# Patient Record
Sex: Female | Born: 1950
Health system: Southern US, Community
[De-identification: ages and names within clinical notes are randomized; demographics above are authoritative.]

## PROBLEM LIST (undated history)

## (undated) DIAGNOSIS — K5792 Diverticulitis of intestine, part unspecified, without perforation or abscess without bleeding: Secondary | ICD-10-CM

## (undated) DIAGNOSIS — R5383 Other fatigue: Secondary | ICD-10-CM

## (undated) DIAGNOSIS — R011 Cardiac murmur, unspecified: Secondary | ICD-10-CM

## (undated) DIAGNOSIS — I5022 Chronic systolic (congestive) heart failure: Secondary | ICD-10-CM

## (undated) DIAGNOSIS — M8580 Other specified disorders of bone density and structure, unspecified site: Secondary | ICD-10-CM

## (undated) DIAGNOSIS — G4733 Obstructive sleep apnea (adult) (pediatric): Secondary | ICD-10-CM

## (undated) DIAGNOSIS — C50919 Malignant neoplasm of unspecified site of unspecified female breast: Secondary | ICD-10-CM

## (undated) DIAGNOSIS — R0683 Snoring: Secondary | ICD-10-CM

## (undated) DIAGNOSIS — E785 Hyperlipidemia, unspecified: Secondary | ICD-10-CM

## (undated) DIAGNOSIS — G459 Transient cerebral ischemic attack, unspecified: Secondary | ICD-10-CM

## (undated) DIAGNOSIS — F32A Depression, unspecified: Secondary | ICD-10-CM

## (undated) DIAGNOSIS — H40003 Preglaucoma, unspecified, bilateral: Secondary | ICD-10-CM

## (undated) DIAGNOSIS — I428 Other cardiomyopathies: Secondary | ICD-10-CM

## (undated) DIAGNOSIS — F329 Major depressive disorder, single episode, unspecified: Secondary | ICD-10-CM

## (undated) DIAGNOSIS — M1990 Unspecified osteoarthritis, unspecified site: Secondary | ICD-10-CM

## (undated) DIAGNOSIS — T7840XA Allergy, unspecified, initial encounter: Secondary | ICD-10-CM

## (undated) DIAGNOSIS — E28319 Asymptomatic premature menopause: Secondary | ICD-10-CM

## (undated) DIAGNOSIS — N200 Calculus of kidney: Secondary | ICD-10-CM

## (undated) HISTORY — DX: Other specified disorders of bone density and structure, unspecified site: M85.80

## (undated) HISTORY — DX: Diverticulitis of intestine, part unspecified, without perforation or abscess without bleeding: K57.92

## (undated) HISTORY — PX: OOPHORECTOMY: SHX86

## (undated) HISTORY — DX: Asymptomatic premature menopause: E28.319

## (undated) HISTORY — DX: Unspecified osteoarthritis, unspecified site: M19.90

## (undated) HISTORY — DX: Hyperlipidemia, unspecified: E78.5

## (undated) HISTORY — DX: Allergy, unspecified, initial encounter: T78.40XA

## (undated) HISTORY — DX: Major depressive disorder, single episode, unspecified: F32.9

## (undated) HISTORY — DX: Transient cerebral ischemic attack, unspecified: G45.9

## (undated) HISTORY — DX: Malignant neoplasm of unspecified site of unspecified female breast: C50.919

## (undated) HISTORY — DX: Cardiac murmur, unspecified: R01.1

## (undated) HISTORY — PX: LITHOTRIPSY: SUR834

## (undated) HISTORY — DX: Preglaucoma, unspecified, bilateral: H40.003

## (undated) HISTORY — DX: Depression, unspecified: F32.A

## (undated) HISTORY — PX: MASTECTOMY: SHX3

## (undated) HISTORY — DX: Calculus of kidney: N20.0

## (undated) HISTORY — DX: Other fatigue: R53.83

## (undated) HISTORY — DX: Obstructive sleep apnea (adult) (pediatric): G47.33

## (undated) HISTORY — DX: Snoring: R06.83

## (undated) HISTORY — PX: CARDIAC DEFIBRILLATOR PLACEMENT: SHX171

---

## 1999-11-04 ENCOUNTER — Encounter: Payer: Self-pay | Admitting: Hematology and Oncology

## 1999-11-04 ENCOUNTER — Encounter: Admission: RE | Admit: 1999-11-04 | Discharge: 1999-11-04 | Payer: Self-pay | Admitting: Hematology and Oncology

## 2000-08-11 ENCOUNTER — Encounter: Payer: Self-pay | Admitting: Oncology

## 2000-08-11 ENCOUNTER — Ambulatory Visit (HOSPITAL_COMMUNITY): Admission: RE | Admit: 2000-08-11 | Discharge: 2000-08-11 | Payer: Self-pay | Admitting: Oncology

## 2000-11-03 ENCOUNTER — Encounter: Payer: Self-pay | Admitting: Oncology

## 2000-11-03 ENCOUNTER — Encounter: Admission: RE | Admit: 2000-11-03 | Discharge: 2000-11-03 | Payer: Self-pay | Admitting: Oncology

## 2002-02-23 ENCOUNTER — Encounter: Payer: Self-pay | Admitting: Oncology

## 2002-02-23 ENCOUNTER — Ambulatory Visit (HOSPITAL_COMMUNITY): Admission: RE | Admit: 2002-02-23 | Discharge: 2002-02-23 | Payer: Self-pay | Admitting: Oncology

## 2002-03-15 ENCOUNTER — Encounter: Payer: Self-pay | Admitting: Family Medicine

## 2002-03-15 ENCOUNTER — Encounter: Admission: RE | Admit: 2002-03-15 | Discharge: 2002-03-15 | Payer: Self-pay | Admitting: Family Medicine

## 2002-04-29 ENCOUNTER — Encounter: Payer: Self-pay | Admitting: Internal Medicine

## 2002-04-29 ENCOUNTER — Ambulatory Visit (HOSPITAL_COMMUNITY): Admission: RE | Admit: 2002-04-29 | Discharge: 2002-04-29 | Payer: Self-pay | Admitting: Gastroenterology

## 2002-04-29 LAB — HM COLONOSCOPY

## 2003-03-27 ENCOUNTER — Encounter: Payer: Self-pay | Admitting: Oncology

## 2003-03-27 ENCOUNTER — Ambulatory Visit (HOSPITAL_COMMUNITY): Admission: RE | Admit: 2003-03-27 | Discharge: 2003-03-27 | Payer: Self-pay | Admitting: Oncology

## 2003-08-08 ENCOUNTER — Encounter: Admission: RE | Admit: 2003-08-08 | Discharge: 2003-08-08 | Payer: Self-pay | Admitting: Family Medicine

## 2003-08-08 ENCOUNTER — Encounter: Payer: Self-pay | Admitting: Family Medicine

## 2003-09-01 ENCOUNTER — Encounter: Admission: RE | Admit: 2003-09-01 | Discharge: 2003-09-01 | Payer: Self-pay | Admitting: Orthopedic Surgery

## 2003-09-01 ENCOUNTER — Encounter: Payer: Self-pay | Admitting: Orthopedic Surgery

## 2003-09-05 ENCOUNTER — Encounter: Admission: RE | Admit: 2003-09-05 | Discharge: 2003-10-24 | Payer: Self-pay | Admitting: Orthopedic Surgery

## 2003-10-26 ENCOUNTER — Encounter (INDEPENDENT_AMBULATORY_CARE_PROVIDER_SITE_OTHER): Payer: Self-pay | Admitting: Cardiology

## 2003-10-26 ENCOUNTER — Ambulatory Visit (HOSPITAL_COMMUNITY): Admission: RE | Admit: 2003-10-26 | Discharge: 2003-10-26 | Payer: Self-pay | Admitting: Cardiology

## 2004-03-18 ENCOUNTER — Other Ambulatory Visit: Admission: RE | Admit: 2004-03-18 | Discharge: 2004-03-18 | Payer: Self-pay | Admitting: Family Medicine

## 2004-05-24 ENCOUNTER — Inpatient Hospital Stay (HOSPITAL_COMMUNITY): Admission: RE | Admit: 2004-05-24 | Discharge: 2004-05-25 | Payer: Self-pay | Admitting: Internal Medicine

## 2004-10-25 ENCOUNTER — Ambulatory Visit: Payer: Self-pay | Admitting: Internal Medicine

## 2005-01-06 ENCOUNTER — Ambulatory Visit: Payer: Self-pay | Admitting: Oncology

## 2005-02-06 ENCOUNTER — Ambulatory Visit: Payer: Self-pay | Admitting: Family Medicine

## 2005-02-24 ENCOUNTER — Ambulatory Visit: Payer: Self-pay | Admitting: Internal Medicine

## 2005-03-21 ENCOUNTER — Ambulatory Visit: Payer: Self-pay | Admitting: Family Medicine

## 2005-03-31 ENCOUNTER — Ambulatory Visit: Payer: Self-pay | Admitting: Oncology

## 2005-04-30 ENCOUNTER — Ambulatory Visit: Payer: Self-pay | Admitting: Internal Medicine

## 2005-05-29 ENCOUNTER — Ambulatory Visit: Payer: Self-pay | Admitting: Internal Medicine

## 2005-05-29 ENCOUNTER — Ambulatory Visit: Payer: Self-pay | Admitting: Cardiology

## 2005-06-04 ENCOUNTER — Ambulatory Visit: Payer: Self-pay | Admitting: Internal Medicine

## 2005-06-20 ENCOUNTER — Ambulatory Visit: Payer: Self-pay | Admitting: Internal Medicine

## 2005-06-25 ENCOUNTER — Ambulatory Visit (HOSPITAL_COMMUNITY): Admission: RE | Admit: 2005-06-25 | Discharge: 2005-06-25 | Payer: Self-pay | Admitting: Internal Medicine

## 2005-06-25 ENCOUNTER — Ambulatory Visit: Payer: Self-pay | Admitting: Internal Medicine

## 2005-06-27 ENCOUNTER — Ambulatory Visit: Payer: Self-pay | Admitting: Internal Medicine

## 2005-08-20 ENCOUNTER — Encounter: Admission: RE | Admit: 2005-08-20 | Discharge: 2005-08-20 | Payer: Self-pay | Admitting: Internal Medicine

## 2005-08-20 ENCOUNTER — Ambulatory Visit: Payer: Self-pay | Admitting: Internal Medicine

## 2005-09-01 ENCOUNTER — Ambulatory Visit: Payer: Self-pay | Admitting: Gastroenterology

## 2005-09-15 ENCOUNTER — Ambulatory Visit: Payer: Self-pay | Admitting: Family Medicine

## 2005-09-15 ENCOUNTER — Encounter: Payer: Self-pay | Admitting: Internal Medicine

## 2005-10-06 ENCOUNTER — Ambulatory Visit: Payer: Self-pay | Admitting: Internal Medicine

## 2005-12-12 ENCOUNTER — Ambulatory Visit: Payer: Self-pay | Admitting: Internal Medicine

## 2005-12-16 ENCOUNTER — Ambulatory Visit: Payer: Self-pay | Admitting: *Deleted

## 2006-01-06 ENCOUNTER — Ambulatory Visit: Payer: Self-pay | Admitting: Oncology

## 2006-02-13 ENCOUNTER — Ambulatory Visit: Payer: Self-pay | Admitting: Internal Medicine

## 2006-03-03 ENCOUNTER — Ambulatory Visit: Payer: Self-pay | Admitting: Internal Medicine

## 2006-05-01 ENCOUNTER — Ambulatory Visit: Payer: Self-pay | Admitting: Oncology

## 2006-05-05 LAB — CBC WITH DIFFERENTIAL/PLATELET
BASO%: 0.5 % (ref 0.0–2.0)
Basophils Absolute: 0 10*3/uL (ref 0.0–0.1)
EOS%: 2.5 % (ref 0.0–7.0)
Eosinophils Absolute: 0.1 10*3/uL (ref 0.0–0.5)
HCT: 40.3 % (ref 34.8–46.6)
HGB: 13.8 g/dL (ref 11.6–15.9)
LYMPH%: 42.1 % (ref 14.0–48.0)
MCH: 31 pg (ref 26.0–34.0)
MCHC: 34.3 g/dL (ref 32.0–36.0)
MCV: 90.4 fL (ref 81.0–101.0)
MONO#: 0.6 10*3/uL (ref 0.1–0.9)
MONO%: 11.8 % (ref 0.0–13.0)
NEUT#: 2.2 10*3/uL (ref 1.5–6.5)
NEUT%: 43.1 % (ref 39.6–76.8)
Platelets: 271 10*3/uL (ref 145–400)
RBC: 4.46 10*6/uL (ref 3.70–5.32)
RDW: 13.1 % (ref 11.3–14.5)
WBC: 5.2 10*3/uL (ref 3.9–10.0)
lymph#: 2.2 10*3/uL (ref 0.9–3.3)

## 2006-05-05 LAB — MORPHOLOGY
PLT EST: ADEQUATE
RBC Comments: NORMAL

## 2006-05-11 ENCOUNTER — Ambulatory Visit: Payer: Self-pay | Admitting: Internal Medicine

## 2006-08-04 ENCOUNTER — Ambulatory Visit: Payer: Self-pay | Admitting: Internal Medicine

## 2006-08-17 ENCOUNTER — Other Ambulatory Visit: Admission: RE | Admit: 2006-08-17 | Discharge: 2006-08-17 | Payer: Self-pay | Admitting: Internal Medicine

## 2006-08-17 ENCOUNTER — Encounter: Payer: Self-pay | Admitting: Internal Medicine

## 2006-08-17 ENCOUNTER — Ambulatory Visit: Payer: Self-pay | Admitting: Internal Medicine

## 2006-08-24 ENCOUNTER — Ambulatory Visit: Admission: RE | Admit: 2006-08-24 | Discharge: 2006-08-24 | Payer: Self-pay | Admitting: Internal Medicine

## 2006-08-24 ENCOUNTER — Ambulatory Visit: Payer: Self-pay | Admitting: Internal Medicine

## 2006-09-28 ENCOUNTER — Ambulatory Visit: Payer: Self-pay | Admitting: Internal Medicine

## 2007-02-22 ENCOUNTER — Ambulatory Visit: Payer: Self-pay | Admitting: Internal Medicine

## 2007-03-02 ENCOUNTER — Ambulatory Visit: Payer: Self-pay | Admitting: Gastroenterology

## 2007-04-06 ENCOUNTER — Ambulatory Visit: Payer: Self-pay | Admitting: Internal Medicine

## 2007-04-06 DIAGNOSIS — Z8719 Personal history of other diseases of the digestive system: Secondary | ICD-10-CM

## 2007-04-06 DIAGNOSIS — R519 Headache, unspecified: Secondary | ICD-10-CM | POA: Insufficient documentation

## 2007-04-06 DIAGNOSIS — Z87442 Personal history of urinary calculi: Secondary | ICD-10-CM | POA: Insufficient documentation

## 2007-04-06 DIAGNOSIS — Z95810 Presence of automatic (implantable) cardiac defibrillator: Secondary | ICD-10-CM

## 2007-04-06 DIAGNOSIS — I5022 Chronic systolic (congestive) heart failure: Secondary | ICD-10-CM

## 2007-04-06 DIAGNOSIS — Z853 Personal history of malignant neoplasm of breast: Secondary | ICD-10-CM

## 2007-04-06 DIAGNOSIS — R51 Headache: Secondary | ICD-10-CM

## 2007-04-06 DIAGNOSIS — I429 Cardiomyopathy, unspecified: Secondary | ICD-10-CM | POA: Insufficient documentation

## 2007-04-06 DIAGNOSIS — F329 Major depressive disorder, single episode, unspecified: Secondary | ICD-10-CM

## 2007-05-17 ENCOUNTER — Ambulatory Visit: Payer: Self-pay | Admitting: Internal Medicine

## 2007-06-11 ENCOUNTER — Ambulatory Visit: Payer: Self-pay | Admitting: Oncology

## 2007-06-15 ENCOUNTER — Encounter: Payer: Self-pay | Admitting: Internal Medicine

## 2007-06-15 LAB — CBC WITH DIFFERENTIAL/PLATELET
BASO%: 0.7 % (ref 0.0–2.0)
LYMPH%: 45.1 % (ref 14.0–48.0)
MCHC: 35.3 g/dL (ref 32.0–36.0)
MONO#: 0.7 10*3/uL (ref 0.1–0.9)
Platelets: 251 10*3/uL (ref 145–400)
RBC: 4.19 10*6/uL (ref 3.70–5.32)
WBC: 5.3 10*3/uL (ref 3.9–10.0)

## 2007-06-15 LAB — COMPREHENSIVE METABOLIC PANEL
ALT: 21 U/L (ref 0–35)
Alkaline Phosphatase: 62 U/L (ref 39–117)
CO2: 26 mEq/L (ref 19–32)
Sodium: 142 mEq/L (ref 135–145)
Total Bilirubin: 0.3 mg/dL (ref 0.3–1.2)
Total Protein: 6.9 g/dL (ref 6.0–8.3)

## 2007-07-21 ENCOUNTER — Telehealth: Payer: Self-pay | Admitting: Internal Medicine

## 2007-07-22 ENCOUNTER — Ambulatory Visit: Payer: Self-pay | Admitting: Internal Medicine

## 2007-10-01 ENCOUNTER — Ambulatory Visit: Payer: Self-pay | Admitting: Internal Medicine

## 2007-10-01 DIAGNOSIS — M949 Disorder of cartilage, unspecified: Secondary | ICD-10-CM

## 2007-10-01 DIAGNOSIS — M899 Disorder of bone, unspecified: Secondary | ICD-10-CM | POA: Insufficient documentation

## 2007-11-01 ENCOUNTER — Ambulatory Visit: Payer: Self-pay | Admitting: Gastroenterology

## 2007-11-30 ENCOUNTER — Ambulatory Visit: Payer: Self-pay | Admitting: Internal Medicine

## 2007-12-23 ENCOUNTER — Telehealth (INDEPENDENT_AMBULATORY_CARE_PROVIDER_SITE_OTHER): Payer: Self-pay | Admitting: *Deleted

## 2007-12-24 ENCOUNTER — Ambulatory Visit: Payer: Self-pay | Admitting: Internal Medicine

## 2007-12-30 LAB — CONVERTED CEMR LAB
ALT: 19 units/L (ref 0–35)
AST: 22 units/L (ref 0–37)
BUN: 12 mg/dL (ref 6–23)
Basophils Absolute: 0 10*3/uL (ref 0.0–0.1)
Basophils Relative: 0 % (ref 0–1)
Calcium: 9.2 mg/dL (ref 8.4–10.5)
Cholesterol: 205 mg/dL — ABNORMAL HIGH (ref 0–200)
Eosinophils Absolute: 0.1 10*3/uL (ref 0.0–0.7)
Glucose, Bld: 91 mg/dL (ref 70–99)
HCT: 40.1 % (ref 36.0–46.0)
HDL: 51 mg/dL (ref >39)
Hemoglobin: 13.7 g/dL (ref 12.0–15.0)
LDL Cholesterol: 123 mg/dL — ABNORMAL HIGH (ref 0–99)
Lymphocytes Relative: 49 % — ABNORMAL HIGH (ref 12–46)
MCV: 90.9 fL (ref 78.0–100.0)
Monocytes Absolute: 0.7 10*3/uL (ref 0.1–1.0)
Neutro Abs: 1.9 10*3/uL (ref 1.7–7.7)
Potassium: 4.2 meq/L (ref 3.5–5.3)
RBC: 4.41 M/uL (ref 3.87–5.11)
Sodium: 139 meq/L (ref 135–145)
TSH: 0.872 microintl units/mL (ref 0.350–5.50)
Total CHOL/HDL Ratio: 4
WBC: 5.3 10*3/uL (ref 4.0–10.5)

## 2008-01-25 ENCOUNTER — Telehealth (INDEPENDENT_AMBULATORY_CARE_PROVIDER_SITE_OTHER): Payer: Self-pay | Admitting: *Deleted

## 2008-02-07 ENCOUNTER — Ambulatory Visit: Payer: Self-pay | Admitting: Internal Medicine

## 2008-02-07 DIAGNOSIS — G4700 Insomnia, unspecified: Secondary | ICD-10-CM

## 2008-03-06 ENCOUNTER — Ambulatory Visit: Payer: Self-pay | Admitting: Internal Medicine

## 2008-03-20 ENCOUNTER — Ambulatory Visit: Payer: Self-pay | Admitting: Internal Medicine

## 2008-03-20 LAB — CONVERTED CEMR LAB
BUN: 10 mg/dL (ref 6–23)
Calcium: 9.1 mg/dL (ref 8.4–10.5)
Chloride: 110 meq/L (ref 96–112)
Creatinine, Ser: 0.9 mg/dL (ref 0.4–1.2)
Eosinophils Absolute: 0.1 10*3/uL (ref 0.0–0.7)
GFR calc Af Amer: 83 mL/min
Glucose, Bld: 81 mg/dL (ref 70–99)
HCT: 39.1 % (ref 36.0–46.0)
MCV: 92.1 fL (ref 78.0–100.0)
Neutro Abs: 2.6 10*3/uL (ref 1.4–7.7)
Potassium: 4.3 meq/L (ref 3.5–5.1)
RDW: 12.1 % (ref 11.5–14.6)
Sodium: 142 meq/L (ref 135–145)

## 2008-03-24 ENCOUNTER — Ambulatory Visit: Payer: Self-pay | Admitting: Internal Medicine

## 2008-03-24 ENCOUNTER — Ambulatory Visit (HOSPITAL_COMMUNITY): Admission: RE | Admit: 2008-03-24 | Discharge: 2008-03-24 | Payer: Self-pay | Admitting: Internal Medicine

## 2008-04-10 ENCOUNTER — Ambulatory Visit: Payer: Self-pay

## 2008-04-20 ENCOUNTER — Telehealth: Payer: Self-pay | Admitting: Gastroenterology

## 2008-05-11 DIAGNOSIS — K5730 Diverticulosis of large intestine without perforation or abscess without bleeding: Secondary | ICD-10-CM | POA: Insufficient documentation

## 2008-05-15 ENCOUNTER — Ambulatory Visit: Payer: Self-pay | Admitting: Gastroenterology

## 2008-05-18 ENCOUNTER — Ambulatory Visit: Payer: Self-pay | Admitting: Internal Medicine

## 2008-05-24 ENCOUNTER — Inpatient Hospital Stay (HOSPITAL_COMMUNITY): Admission: AD | Admit: 2008-05-24 | Discharge: 2008-05-25 | Payer: Self-pay | Admitting: Cardiology

## 2008-05-25 ENCOUNTER — Encounter (INDEPENDENT_AMBULATORY_CARE_PROVIDER_SITE_OTHER): Payer: Self-pay | Admitting: Cardiology

## 2008-06-09 ENCOUNTER — Ambulatory Visit: Payer: Self-pay | Admitting: Internal Medicine

## 2008-06-13 ENCOUNTER — Telehealth (INDEPENDENT_AMBULATORY_CARE_PROVIDER_SITE_OTHER): Payer: Self-pay | Admitting: *Deleted

## 2008-06-27 ENCOUNTER — Other Ambulatory Visit: Admission: RE | Admit: 2008-06-27 | Discharge: 2008-06-27 | Payer: Self-pay | Admitting: Internal Medicine

## 2008-06-27 ENCOUNTER — Encounter: Payer: Self-pay | Admitting: Internal Medicine

## 2008-06-27 ENCOUNTER — Ambulatory Visit: Payer: Self-pay | Admitting: Internal Medicine

## 2008-06-27 LAB — CONVERTED CEMR LAB
INR: 3.7
Prothrombin Time: 23.2 s

## 2008-06-28 ENCOUNTER — Encounter (INDEPENDENT_AMBULATORY_CARE_PROVIDER_SITE_OTHER): Payer: Self-pay | Admitting: *Deleted

## 2008-07-03 ENCOUNTER — Encounter: Payer: Self-pay | Admitting: Internal Medicine

## 2008-07-05 ENCOUNTER — Encounter: Payer: Self-pay | Admitting: Internal Medicine

## 2008-07-05 ENCOUNTER — Ambulatory Visit: Payer: Self-pay | Admitting: Internal Medicine

## 2008-07-12 ENCOUNTER — Encounter (INDEPENDENT_AMBULATORY_CARE_PROVIDER_SITE_OTHER): Payer: Self-pay | Admitting: *Deleted

## 2008-07-27 ENCOUNTER — Encounter (INDEPENDENT_AMBULATORY_CARE_PROVIDER_SITE_OTHER): Payer: Self-pay | Admitting: *Deleted

## 2008-07-28 ENCOUNTER — Ambulatory Visit: Payer: Self-pay | Admitting: Internal Medicine

## 2008-08-01 ENCOUNTER — Ambulatory Visit: Payer: Self-pay | Admitting: Internal Medicine

## 2008-08-12 ENCOUNTER — Emergency Department (HOSPITAL_COMMUNITY): Admission: EM | Admit: 2008-08-12 | Discharge: 2008-08-12 | Payer: Self-pay | Admitting: Family Medicine

## 2008-08-29 ENCOUNTER — Ambulatory Visit: Payer: Self-pay | Admitting: Internal Medicine

## 2008-08-29 LAB — CONVERTED CEMR LAB: Prothrombin Time: 18.7 s

## 2008-09-26 ENCOUNTER — Ambulatory Visit: Payer: Self-pay | Admitting: Internal Medicine

## 2008-09-26 LAB — CONVERTED CEMR LAB: Prothrombin Time: 19.2 s

## 2008-09-27 ENCOUNTER — Telehealth: Payer: Self-pay | Admitting: Gastroenterology

## 2008-09-28 ENCOUNTER — Ambulatory Visit: Payer: Self-pay | Admitting: Gastroenterology

## 2008-09-28 DIAGNOSIS — R1032 Left lower quadrant pain: Secondary | ICD-10-CM | POA: Insufficient documentation

## 2008-09-28 LAB — CONVERTED CEMR LAB
Basophils Absolute: 0 10*3/uL (ref 0.0–0.1)
HCT: 38.5 % (ref 36.0–46.0)
Lymphocytes Relative: 44.6 % (ref 12.0–46.0)
MCHC: 34.4 g/dL (ref 30.0–36.0)
MCV: 92.3 fL (ref 78.0–100.0)
Monocytes Absolute: 0.7 10*3/uL (ref 0.1–1.0)
Monocytes Relative: 14.5 % — ABNORMAL HIGH (ref 3.0–12.0)
Neutro Abs: 2 10*3/uL (ref 1.4–7.7)
Neutrophils Relative %: 38.4 % — ABNORMAL LOW (ref 43.0–77.0)
RDW: 12.2 % (ref 11.5–14.6)
WBC: 5.1 10*3/uL (ref 4.5–10.5)

## 2008-10-25 ENCOUNTER — Ambulatory Visit: Payer: Self-pay | Admitting: Internal Medicine

## 2008-10-25 LAB — CONVERTED CEMR LAB: Prothrombin Time: 18.2 s

## 2008-11-06 ENCOUNTER — Telehealth: Payer: Self-pay | Admitting: Gastroenterology

## 2008-11-13 ENCOUNTER — Ambulatory Visit: Payer: Self-pay | Admitting: Internal Medicine

## 2008-11-27 ENCOUNTER — Ambulatory Visit: Payer: Self-pay | Admitting: Gastroenterology

## 2008-12-19 ENCOUNTER — Ambulatory Visit: Payer: Self-pay | Admitting: Internal Medicine

## 2008-12-20 ENCOUNTER — Telehealth (INDEPENDENT_AMBULATORY_CARE_PROVIDER_SITE_OTHER): Payer: Self-pay | Admitting: *Deleted

## 2008-12-25 ENCOUNTER — Encounter: Payer: Self-pay | Admitting: Internal Medicine

## 2009-01-19 ENCOUNTER — Ambulatory Visit: Payer: Self-pay | Admitting: Family Medicine

## 2009-02-06 ENCOUNTER — Encounter: Payer: Self-pay | Admitting: Internal Medicine

## 2009-02-15 ENCOUNTER — Encounter: Payer: Self-pay | Admitting: Internal Medicine

## 2009-02-15 ENCOUNTER — Telehealth (INDEPENDENT_AMBULATORY_CARE_PROVIDER_SITE_OTHER): Payer: Self-pay | Admitting: *Deleted

## 2009-02-15 ENCOUNTER — Ambulatory Visit: Payer: Self-pay | Admitting: Internal Medicine

## 2009-02-15 DIAGNOSIS — I472 Ventricular tachycardia, unspecified: Secondary | ICD-10-CM | POA: Insufficient documentation

## 2009-02-28 ENCOUNTER — Telehealth (INDEPENDENT_AMBULATORY_CARE_PROVIDER_SITE_OTHER): Payer: Self-pay | Admitting: *Deleted

## 2009-03-14 ENCOUNTER — Encounter: Admission: RE | Admit: 2009-03-14 | Discharge: 2009-03-14 | Payer: Self-pay | Admitting: Cardiology

## 2009-03-14 ENCOUNTER — Ambulatory Visit: Payer: Self-pay | Admitting: Internal Medicine

## 2009-03-14 LAB — CONVERTED CEMR LAB: INR: 2.3

## 2009-03-21 ENCOUNTER — Encounter: Payer: Self-pay | Admitting: Internal Medicine

## 2009-05-10 ENCOUNTER — Ambulatory Visit: Payer: Self-pay | Admitting: Family Medicine

## 2009-05-10 DIAGNOSIS — R05 Cough: Secondary | ICD-10-CM

## 2009-05-10 DIAGNOSIS — R059 Cough, unspecified: Secondary | ICD-10-CM | POA: Insufficient documentation

## 2009-05-10 LAB — CONVERTED CEMR LAB: Prothrombin Time: 18.5 s

## 2009-05-31 ENCOUNTER — Encounter: Payer: Self-pay | Admitting: Internal Medicine

## 2009-05-31 ENCOUNTER — Ambulatory Visit: Payer: Self-pay | Admitting: Internal Medicine

## 2009-06-19 ENCOUNTER — Ambulatory Visit: Payer: Self-pay | Admitting: Family Medicine

## 2009-06-19 LAB — CONVERTED CEMR LAB: Prothrombin Time: 15.3 s

## 2009-07-02 ENCOUNTER — Ambulatory Visit: Payer: Self-pay | Admitting: Internal Medicine

## 2009-07-02 DIAGNOSIS — R209 Unspecified disturbances of skin sensation: Secondary | ICD-10-CM

## 2009-07-02 LAB — CONVERTED CEMR LAB: INR: 2

## 2009-08-31 ENCOUNTER — Telehealth: Payer: Self-pay | Admitting: Internal Medicine

## 2009-09-21 ENCOUNTER — Ambulatory Visit: Payer: Self-pay | Admitting: Internal Medicine

## 2009-09-21 LAB — CONVERTED CEMR LAB: Prothrombin Time: 20.8 s

## 2009-10-08 ENCOUNTER — Telehealth: Payer: Self-pay | Admitting: Internal Medicine

## 2009-10-08 ENCOUNTER — Ambulatory Visit: Payer: Self-pay | Admitting: Internal Medicine

## 2009-10-09 ENCOUNTER — Encounter: Payer: Self-pay | Admitting: Family Medicine

## 2009-10-09 ENCOUNTER — Encounter: Payer: Self-pay | Admitting: Internal Medicine

## 2009-10-09 LAB — CONVERTED CEMR LAB: Bacteria, UA: NONE SEEN

## 2009-10-11 ENCOUNTER — Telehealth (INDEPENDENT_AMBULATORY_CARE_PROVIDER_SITE_OTHER): Payer: Self-pay | Admitting: *Deleted

## 2009-10-15 ENCOUNTER — Telehealth: Payer: Self-pay | Admitting: Internal Medicine

## 2009-11-05 ENCOUNTER — Telehealth (INDEPENDENT_AMBULATORY_CARE_PROVIDER_SITE_OTHER): Payer: Self-pay | Admitting: *Deleted

## 2009-11-05 ENCOUNTER — Encounter: Payer: Self-pay | Admitting: Internal Medicine

## 2009-11-27 ENCOUNTER — Ambulatory Visit: Payer: Self-pay | Admitting: Internal Medicine

## 2009-12-20 ENCOUNTER — Encounter: Payer: Self-pay | Admitting: Internal Medicine

## 2009-12-21 ENCOUNTER — Telehealth (INDEPENDENT_AMBULATORY_CARE_PROVIDER_SITE_OTHER): Payer: Self-pay | Admitting: *Deleted

## 2010-01-14 ENCOUNTER — Telehealth (INDEPENDENT_AMBULATORY_CARE_PROVIDER_SITE_OTHER): Payer: Self-pay | Admitting: *Deleted

## 2010-01-31 ENCOUNTER — Ambulatory Visit: Payer: Self-pay | Admitting: Internal Medicine

## 2010-02-06 ENCOUNTER — Other Ambulatory Visit: Admission: RE | Admit: 2010-02-06 | Discharge: 2010-02-06 | Payer: Self-pay | Admitting: Internal Medicine

## 2010-02-06 ENCOUNTER — Ambulatory Visit: Payer: Self-pay | Admitting: Internal Medicine

## 2010-02-06 LAB — CONVERTED CEMR LAB: INR: 2.6

## 2010-02-06 LAB — HM PAP SMEAR

## 2010-02-07 ENCOUNTER — Ambulatory Visit: Payer: Self-pay | Admitting: Internal Medicine

## 2010-02-11 ENCOUNTER — Telehealth: Payer: Self-pay | Admitting: Internal Medicine

## 2010-02-11 ENCOUNTER — Encounter: Payer: Self-pay | Admitting: Internal Medicine

## 2010-02-13 ENCOUNTER — Ambulatory Visit: Payer: Self-pay | Admitting: Internal Medicine

## 2010-02-13 LAB — CONVERTED CEMR LAB: Pro B Natriuretic peptide (BNP): 458 pg/mL — ABNORMAL HIGH (ref 0.0–100.0)

## 2010-02-14 LAB — CONVERTED CEMR LAB
ALT: 20 units/L (ref 0–35)
AST: 21 units/L (ref 0–37)
BUN: 8 mg/dL (ref 6–23)
CO2: 30 meq/L (ref 19–32)
Chloride: 108 meq/L (ref 96–112)
Creatinine, Ser: 0.8 mg/dL (ref 0.4–1.2)
Eosinophils Absolute: 0.1 10*3/uL (ref 0.0–0.7)
GFR calc non Af Amer: 77.59 mL/min (ref >60)
Glucose, Bld: 90 mg/dL (ref 70–99)
Lymphs Abs: 1.7 10*3/uL (ref 0.7–4.0)
MCV: 95 fL (ref 78.0–100.0)
Monocytes Absolute: 0.7 10*3/uL (ref 0.1–1.0)
Neutro Abs: 3.4 10*3/uL (ref 1.4–7.7)
Neutrophils Relative %: 56.9 % (ref 43.0–77.0)
Platelets: 232 10*3/uL (ref 150.0–400.0)
Potassium: 4.2 meq/L (ref 3.5–5.1)
RBC: 4.53 M/uL (ref 3.87–5.11)
RDW: 12.8 % (ref 11.5–14.6)
Sodium: 142 meq/L (ref 135–145)
WBC: 5.9 10*3/uL (ref 4.5–10.5)

## 2010-02-19 LAB — CONVERTED CEMR LAB: LDL Cholesterol: 115 mg/dL — ABNORMAL HIGH (ref 0–99)

## 2010-03-06 ENCOUNTER — Encounter (INDEPENDENT_AMBULATORY_CARE_PROVIDER_SITE_OTHER): Payer: Self-pay | Admitting: *Deleted

## 2010-03-06 ENCOUNTER — Ambulatory Visit: Payer: Self-pay | Admitting: Internal Medicine

## 2010-03-06 ENCOUNTER — Telehealth (INDEPENDENT_AMBULATORY_CARE_PROVIDER_SITE_OTHER): Payer: Self-pay | Admitting: *Deleted

## 2010-03-06 DIAGNOSIS — R42 Dizziness and giddiness: Secondary | ICD-10-CM

## 2010-03-06 LAB — CONVERTED CEMR LAB: Hemoglobin: 12.6 g/dL

## 2010-04-02 ENCOUNTER — Ambulatory Visit: Payer: Self-pay | Admitting: Gastroenterology

## 2010-04-02 ENCOUNTER — Telehealth: Payer: Self-pay | Admitting: Gastroenterology

## 2010-04-02 ENCOUNTER — Encounter: Payer: Self-pay | Admitting: Nurse Practitioner

## 2010-04-02 DIAGNOSIS — K5732 Diverticulitis of large intestine without perforation or abscess without bleeding: Secondary | ICD-10-CM | POA: Insufficient documentation

## 2010-04-03 ENCOUNTER — Telehealth: Payer: Self-pay | Admitting: Nurse Practitioner

## 2010-04-04 ENCOUNTER — Telehealth (INDEPENDENT_AMBULATORY_CARE_PROVIDER_SITE_OTHER): Payer: Self-pay | Admitting: *Deleted

## 2010-04-12 ENCOUNTER — Ambulatory Visit: Payer: Self-pay | Admitting: Internal Medicine

## 2010-04-15 ENCOUNTER — Telehealth: Payer: Self-pay | Admitting: Nurse Practitioner

## 2010-05-02 ENCOUNTER — Ambulatory Visit: Payer: Self-pay | Admitting: Internal Medicine

## 2010-05-03 ENCOUNTER — Encounter: Payer: Self-pay | Admitting: Internal Medicine

## 2010-05-30 ENCOUNTER — Ambulatory Visit: Payer: Self-pay | Admitting: Internal Medicine

## 2010-05-30 ENCOUNTER — Telehealth: Payer: Self-pay | Admitting: Gastroenterology

## 2010-06-03 ENCOUNTER — Encounter: Payer: Self-pay | Admitting: Internal Medicine

## 2010-06-03 ENCOUNTER — Telehealth: Payer: Self-pay | Admitting: Gastroenterology

## 2010-06-05 ENCOUNTER — Telehealth: Payer: Self-pay | Admitting: Internal Medicine

## 2010-06-06 ENCOUNTER — Encounter: Payer: Self-pay | Admitting: Internal Medicine

## 2010-06-11 ENCOUNTER — Telehealth (INDEPENDENT_AMBULATORY_CARE_PROVIDER_SITE_OTHER): Payer: Self-pay | Admitting: *Deleted

## 2010-06-18 ENCOUNTER — Ambulatory Visit: Payer: Self-pay | Admitting: Internal Medicine

## 2010-06-20 LAB — CONVERTED CEMR LAB: POC INR: 3.2

## 2010-06-21 ENCOUNTER — Encounter (INDEPENDENT_AMBULATORY_CARE_PROVIDER_SITE_OTHER): Payer: Self-pay | Admitting: *Deleted

## 2010-06-24 ENCOUNTER — Telehealth: Payer: Self-pay | Admitting: Internal Medicine

## 2010-06-25 ENCOUNTER — Telehealth (INDEPENDENT_AMBULATORY_CARE_PROVIDER_SITE_OTHER): Payer: Self-pay | Admitting: *Deleted

## 2010-06-27 ENCOUNTER — Ambulatory Visit: Payer: Self-pay | Admitting: Gastroenterology

## 2010-06-27 ENCOUNTER — Encounter (INDEPENDENT_AMBULATORY_CARE_PROVIDER_SITE_OTHER): Payer: Self-pay | Admitting: *Deleted

## 2010-07-04 ENCOUNTER — Telehealth: Payer: Self-pay | Admitting: Internal Medicine

## 2010-08-05 ENCOUNTER — Telehealth (INDEPENDENT_AMBULATORY_CARE_PROVIDER_SITE_OTHER): Payer: Self-pay | Admitting: *Deleted

## 2010-08-05 ENCOUNTER — Telehealth: Payer: Self-pay | Admitting: Internal Medicine

## 2010-08-12 ENCOUNTER — Ambulatory Visit: Payer: Self-pay | Admitting: Internal Medicine

## 2010-08-16 ENCOUNTER — Encounter (INDEPENDENT_AMBULATORY_CARE_PROVIDER_SITE_OTHER): Payer: Self-pay | Admitting: *Deleted

## 2010-08-19 ENCOUNTER — Telehealth: Payer: Self-pay | Admitting: Gastroenterology

## 2010-08-20 ENCOUNTER — Ambulatory Visit: Payer: Self-pay | Admitting: Gastroenterology

## 2010-08-20 ENCOUNTER — Telehealth: Payer: Self-pay | Admitting: Gastroenterology

## 2010-08-21 ENCOUNTER — Ambulatory Visit: Payer: Self-pay | Admitting: Internal Medicine

## 2010-08-22 ENCOUNTER — Telehealth: Payer: Self-pay | Admitting: Physician Assistant

## 2010-08-23 ENCOUNTER — Ambulatory Visit: Payer: Self-pay | Admitting: Cardiology

## 2010-09-03 ENCOUNTER — Telehealth: Payer: Self-pay | Admitting: Gastroenterology

## 2010-09-03 ENCOUNTER — Telehealth (INDEPENDENT_AMBULATORY_CARE_PROVIDER_SITE_OTHER): Payer: Self-pay | Admitting: *Deleted

## 2010-09-12 ENCOUNTER — Ambulatory Visit: Payer: Self-pay | Admitting: Internal Medicine

## 2010-09-20 ENCOUNTER — Encounter (INDEPENDENT_AMBULATORY_CARE_PROVIDER_SITE_OTHER): Payer: Self-pay | Admitting: *Deleted

## 2010-09-23 ENCOUNTER — Ambulatory Visit: Payer: Self-pay | Admitting: Internal Medicine

## 2010-09-23 DIAGNOSIS — J069 Acute upper respiratory infection, unspecified: Secondary | ICD-10-CM | POA: Insufficient documentation

## 2010-09-30 ENCOUNTER — Ambulatory Visit: Payer: Self-pay | Admitting: Internal Medicine

## 2010-09-30 ENCOUNTER — Encounter (INDEPENDENT_AMBULATORY_CARE_PROVIDER_SITE_OTHER): Payer: Self-pay | Admitting: *Deleted

## 2010-10-21 ENCOUNTER — Telehealth (INDEPENDENT_AMBULATORY_CARE_PROVIDER_SITE_OTHER): Payer: Self-pay | Admitting: *Deleted

## 2010-10-22 ENCOUNTER — Ambulatory Visit: Payer: Self-pay | Admitting: Internal Medicine

## 2010-10-31 ENCOUNTER — Ambulatory Visit: Payer: Self-pay | Admitting: Internal Medicine

## 2010-11-04 ENCOUNTER — Telehealth: Payer: Self-pay | Admitting: Internal Medicine

## 2010-12-24 NOTE — Assessment & Plan Note (Signed)
Summary: cough/cbs   Vital Signs:  Patient profile:   60 year old female Height:      64.5 inches Weight:      204 pounds BMI:     34.60 Temp:     98.2 degrees F BP sitting:   120 / 70  Vitals Entered By: Shary Decamp (Apr 12, 2010 2:01 PM) CC: cough x 2 weeks   History of Present Illness: chief complaint today is cough, she has spells  of cough with occasionally white sputum Symptoms started 2 weeks ago  Review of systems  does not feel like he had a URI, no runny nose or sore throat. No postnasal dripping No fever No GERD No itchy eyes or nose Left sinus slightly congested  Also, about to finish Cipro-Flagyl as prescribed by GI for diverticulitis. she has one dose left  Current Medications (verified): 1)  Zoloft 25 Mg Tabs (Sertraline Hcl) .... Take 1 Tablet By Mouth Once A Day 2)  Maxalt 10 Mg Tabs (Rizatriptan Benzoate) .... Take 1-2 Tablet By Mouth Every Two Hours 3)  Lasix 40 Mg Tabs (Furosemide) .... Take 1 Tablet By Mouth Once A Day 4)  Coumadin 5 Mg  Tabs (Warfarin Sodium) .... As Directed 5)  Coreg 12.5 Mg  Tabs (Carvedilol) .... Take One By Mouth Two Times A Day 6)  K-Tabs 10 Meq  Tbcr (Potassium Chloride) .... Take One By Mouth Two Times A Day 7)  Multivitamins   Caps (Multiple Vitamin) .... Take 1 Capsule By Mouth Once A Day 8)  Zolpidem Tartrate 10 Mg Tabs (Zolpidem Tartrate) .Marland Kitchen.. 1 By Mouth At Bedtime 9)  Lisinopril 10 Mg Tabs (Lisinopril) .Marland Kitchen.. 1 By Mouth Once Daily 10)  Flonase 50 Mcg/act Susp (Fluticasone Propionate) .... 2 Sprays Daily On Each Side of The Nose 11)  Cipro 500 Mg Tabs (Ciprofloxacin Hcl) .... Take 1 Tab Twice Daily X 10 Days 12)  Flagyl 500 Mg Tabs (Metronidazole) .... Take 1 Tab Twice Daily X 10 Days 13)  Hydrocodone-Acetaminophen 5-325 Mg Tabs (Hydrocodone-Acetaminophen) .... Take 1 Tab Every 6 Hours As Needed For Pain  Allergies (verified): 1)  Keflex 2)  * ********latex*********  Past History:  Past Medical History: Reviewed  history from 02/06/2010 and no changes required. Breast cancer, twice (first on the L breast 1982 w/ chest wall involvment, then a second breast Ca on the R in 1987) Congestive heart failure, due to Adriamycin, s/p ICD Depression Insomnia Headache Diverticulitis, hx of Osteopenia RENAL CALCULUS, HX OF (ICD-V13.01) HEADACHE Menopause since her 30s   Past Surgical History: Reviewed history from 11/27/2009 and no changes required. Mastectomy, B Oophorectomy B,  per a geneticists advise at [facility] Defibrillator-AICD Replaced 5/09  Social History: Reviewed history from 04/02/2010 and no changes required. Married related to Mr and [name] , they are my patients as well Never Smoked 3 kids Illicit Drug Use - no Patient does not get regular exercise but is very active  ETOH-- socially pre school director   Review of Systems      See HPI  Physical Exam  General:  alert, well-developed, and well-nourished.  no apparent distress Head:  face symmetric, nontender to palpation Nose:  slightly congested Mouth:  no redness or discharge Lungs:  normal respiratory effort, no intercostal retractions, and no accessory muscle use.  few rhonchi if any with cough. No wheezing Heart:  normal rate and regular rhythm.  + syst murmur    Impression & Recommendations:  Problem # 1:  COUGH (ICD-786.2) cough of unclear etiology No postnasal dripping, no GERD symptoms, no obvious  allergies although this is the right time of the year to have allergies. She is on ACE inhibitors  but so far she has no chronic cough. Plan: restart nasal steroids  Start Protonix Robitussin-DM  hydrocodone as needed for cough (it  was originally prescribed for pain) if no better consider abx   Problem # 2:  ENCOUNTER FOR THERAPEUTIC DRUG MONITORING (ICD-V58.83) INR elevated in the setting of recent antibiotics, see  instructions Orders: Fingerstick ([address]) Protime (60454UJ)  Complete Medication  List: 1)  Zoloft 25 Mg Tabs (Sertraline hcl) .... Take 1 tablet by mouth once a day 2)  Maxalt 10 Mg Tabs (Rizatriptan benzoate) .... Take 1-2 tablet by mouth every two hours 3)  Lasix 40 Mg Tabs (Furosemide) .... Take 1 tablet by mouth once a day 4)  Coumadin 5 Mg Tabs (Warfarin sodium) .... As directed 5)  Coreg 12.5 Mg Tabs (Carvedilol) .... Take one by mouth two times a day 6)  K-tabs 10 Meq Tbcr (Potassium chloride) .... Take one by mouth two times a day 7)  Multivitamins Caps (Multiple vitamin) .... Take 1 capsule by mouth once a day 8)  Zolpidem Tartrate 10 Mg Tabs (Zolpidem tartrate) .Marland Kitchen.. 1 by mouth at bedtime 9)  Lisinopril 10 Mg Tabs (Lisinopril) .Marland Kitchen.. 1 by mouth once daily 10)  Flonase 50 Mcg/act Susp (Fluticasone propionate) .... 2 sprays daily on each side of the nose 11)  Cipro 500 Mg Tabs (Ciprofloxacin hcl) .... Take 1 tab twice daily x 10 days 12)  Flagyl 500 Mg Tabs (Metronidazole) .... Take 1 tab twice daily x 10 days 13)  Hydrocodone-acetaminophen 5-325 Mg Tabs (Hydrocodone-acetaminophen) .... Take 1 tab every 6 hours as needed for pain 14)  Protonix 40 Mg Tbec (Pantoprazole sodium) .... One before breakfast everyday  Patient Instructions: 1)  Start Protonix 40 mg daily 2)  Robitussin-DM as needed for cough 3)  hydrocodone as needed for persistent  cough (it  was originally prescribed for pain) 4)  Hold Coumadin for 3 days then restart at the same dose 5)  INR in 2 weeks 6)  If GI prescribing more antibiotics, let us go Prescriptions: FLONASE 50 MCG/ACT SUSP (FLUTICASONE PROPIONATE) 2 sprays daily on each side of the nose  #1 x 6   Entered and Authorized by:   Elita Quick E. Paz MD   Signed by:   Nolon Rod. Paz MD on 04/12/2010   Method used:   Electronically to        [id number] (retail)       [address]       [place], [place]  [address]       Ph: [phone]       Fax: [phone]   RxID:   [id number] PROTONIX 40 MG TBEC  (PANTOPRAZOLE SODIUM) one before breakfast everyday  #30 x 3   Entered and Authorized by:   Nolon Rod. Paz MD   Signed by:   Nolon Rod. Paz MD on 04/12/2010   Method used:   Electronically to        [id number]* (retail)       [address]       [place], [place]  [address]       Ph: [phone]       Fax: [phone]  RxID:   [id number]   Laboratory Results   Blood Tests      INR: 5.0   (Normal Range: 0.88-1.12   Therap INR: 2.0-3.5) Comments: current dose: 7mg  daily except 5 mg on tues & thurs on cipro, flagyl Shary Decamp  Apr 12, 2010 2:02 PM

## 2010-12-24 NOTE — Progress Notes (Signed)
Summary: PT check  Phone Note Outgoing Call   Call placed by: Army Fossa CMA,  October 21, 2010 7:59 AM Summary of Call: Pt is due for PT check. Will you schedule?   Follow-up for Phone Call        Patient is coming in tomorrow afternoon @ 4pm. Follow-up by: Harold Barban,  October 21, 2010 8:32 AM

## 2010-12-24 NOTE — Letter (Signed)
Summary: Anticoagulation Modification Letter  [facility]  [address] [place], [place] [address]   Phone: [phone]  Fax: [phone]    June 27, 2010  Re:    [name] DOB:    26-Oct-1951 MRN:    [id number]    Dear Donnie Aho, We have scheduled the above patient for an endoscopic procedure. Our records show that  he/she is on anticoagulation therapy. Please advise as to how long the patient may come off their therapy of coumadin prior to the scheduled procedure(s) on pt to call back to schedule procedure.   Please fax back/or route the completed form to Robin at [phone]  Thank you for your help with this matter.  Sincerely,  Merri Ray CMA Duncan Dull)   Physician Recommendation:  _  Hold Coumadin 5 days prior ____________

## 2010-12-24 NOTE — Progress Notes (Signed)
Summary: Triage   Phone Note Call from Patient Call back at Home Phone [phone]   Caller: Patient Call For: Dr. Arlyce Dice Reason for Call: Talk to Nurse Summary of Call: Feels like she has a "flare up" with her diverticulitis. Alot of abd pain Initial call taken by: Karna Christmas,  Apr 02, 2010 9:59 AM  Follow-up for Phone Call        Last OV 11-06-2008.  Pt. c/o 2 days of lower abd. pain, getting worse.  Denies constipation, diarrhea, blood, black stools, fever, n/v.   1) See Willette Cluster NP today at 2:30pm 2) Liquids only today. 3) tylenol/Ibuprofen as needed  Follow-up by: Laureen Ochs LPN,  Apr 02, 2010 10:40 AM

## 2010-12-24 NOTE — Assessment & Plan Note (Signed)
Summary: 6 mth fu/kdc   Vital Signs:  Patient profile:   60 year old female Weight:      198.13 pounds Pulse rate:   84 / minute Pulse rhythm:   regular BP sitting:   114 / 82  (left arm) Cuff size:   regular  Vitals Entered By: Army Fossa CMA (August 12, 2010 4:06 PM) CC: 6 month f/u, PT check   History of Present Illness: ROV  Congestive heart failure-- due for PT check reports  good Coumadin compliance  Current Medications (verified): 1)  Zoloft 25 Mg Tabs (Sertraline Hcl) .... Take 1 Tablet By Mouth Once A Day 2)  Lasix 40 Mg Tabs (Furosemide) .... Take 1 Tablet By Mouth Once A Day 3)  Coumadin 5 Mg  Tabs (Warfarin Sodium) .... As Directed 4)  Coreg 12.5 Mg  Tabs (Carvedilol) .... Take One By Mouth Two Times A Day 5)  K-Tabs 10 Meq  Tbcr (Potassium Chloride) .... Take One By Mouth Two Times A Day 6)  Multivitamins   Caps (Multiple Vitamin) .... Take 1 Capsule By Mouth Once A Day 7)  Zolpidem Tartrate 10 Mg Tabs (Zolpidem Tartrate) .Marland Kitchen.. 1 By Mouth At Bedtime 8)  Lisinopril 10 Mg Tabs (Lisinopril) .Marland Kitchen.. 1 By Mouth Once Daily 9)  Flonase 50 Mcg/act Susp (Fluticasone Propionate) .... 2 Sprays Daily On Each Side of The Nose 10)  Hydrocodone-Acetaminophen 5-325 Mg Tabs (Hydrocodone-Acetaminophen) .... Take 1 Tab Every 6 Hours As Needed For Pain 11)  Protonix 40 Mg Tbec (Pantoprazole Sodium) .... One Before Breakfast Everyday 12)  Metronidazole 500 Mg Tabs (Metronidazole) .... Take 1 Tab Two Times A Day X 10 Days 13)  Cipro 500 Mg Tabs (Ciprofloxacin Hcl) .... Take 1 Tab Twice Dailyx 10 Days  Allergies: 1)  Keflex 2)  * ********latex*********  Past History:  Past Medical History: Reviewed history from 02/06/2010 and no changes required. Breast cancer, twice (first on the L breast 1982 w/ chest wall involvment, then a second breast Ca on the R in 1987) Congestive heart failure, due to Adriamycin, s/p ICD Depression Insomnia Headache Diverticulitis, hx  of Osteopenia RENAL CALCULUS, HX OF (ICD-V13.01) HEADACHE Menopause since her 30s   Past Surgical History: Reviewed history from 11/27/2009 and no changes required. Mastectomy, B Oophorectomy B,  per a geneticists advise at [facility] Defibrillator-AICD Replaced 5/09  Social History: Reviewed history from 04/02/2010 and no changes required. Married related to Mr and [name] , they are my patients as well Never Smoked 3 kids Illicit Drug Use - no Patient does not get regular exercise but is very active  ETOH-- socially pre school director   Review of Systems CV:  Denies chest pain or discomfort and swelling of hands. Resp:  Denies cough and shortness of breath.  Physical Exam  General:  alert and well-developed.   Lungs:  normal respiratory effort, no intercostal retractions, and no accessory muscle use.  few rhonchi if any with cough. No wheezing Heart:  normal rate and regular rhythm.  + syst murmur  Extremities:  no lower extremity edema   Impression & Recommendations:  Problem # 1:  CARDIOMYOPATHY, SECONDARY NOS (ICD-425.9)  on Coumadin Has not been seen in few months Encourage compliance with INR followup, seen instructions  Problem # 2:  DIVERTICULITIS-COLON (ICD-562.11) per GI  Complete Medication List: 1)  Zoloft 25 Mg Tabs (Sertraline hcl) .... Take 1 tablet by mouth once a day 2)  Lasix 40 Mg Tabs (Furosemide) .... Take 1 tablet  by mouth once a day 3)  Coumadin 5 Mg Tabs (Warfarin sodium) .... As directed 4)  Coreg 12.5 Mg Tabs (Carvedilol) .... Take one by mouth two times a day 5)  K-tabs 10 Meq Tbcr (Potassium chloride) .... Take one by mouth two times a day 6)  Multivitamins Caps (Multiple vitamin) .... Take 1 capsule by mouth once a day 7)  Zolpidem Tartrate 10 Mg Tabs (Zolpidem tartrate) .Marland Kitchen.. 1 by mouth at bedtime 8)  Lisinopril 10 Mg Tabs (Lisinopril) .Marland Kitchen.. 1 by mouth once daily 9)  Flonase 50 Mcg/act Susp (Fluticasone propionate) .... 2 sprays  daily on each side of the nose 10)  Hydrocodone-acetaminophen 5-325 Mg Tabs (Hydrocodone-acetaminophen) .... Take 1 tab every 6 hours as needed for pain 11)  Protonix 40 Mg Tbec (Pantoprazole sodium) .... One before breakfast everyday  Other Orders: Admin 1st Vaccine (57846) Flu Vaccine 24yrs + [phone]) Protime [phone])  Patient Instructions: 1)  new dose 2)  5 mg Monday Wednesday and Friday 3)  7 mg all other days 4)  Recheck in 3 weeks 5)     Laboratory Results   Blood Tests      INR: 3.3   (Normal Range: 0.88-1.12   Therap INR: 2.0-3.5) Comments: has 5mg  and 2mg  tabs Takes 5mg  tues and thurs all other days 7mg .  45 mg weekly --------- new dose 5 mg Monday Wednesday and Friday 7 mg all other days Recheck in 3 weeks 43 mg/ weekly Jose E. Paz MD  August 12, 2010 4:29 PM      Flu Vaccine Consent Questions     Do you have a history of severe allergic reactions to this vaccine? no    Any prior history of allergic reactions to egg and/or gelatin? no    Do you have a sensitivity to the preservative Thimersol? no    Do you have a past history of Guillan-Barre Syndrome? no    Do you currently have an acute febrile illness? no    Have you ever had a severe reaction to latex? no    Vaccine information given and explained to patient? yes    Are you currently pregnant? no    Lot Number:[id number]   Exp Date:05/24/2011   Site Given  Left Deltoid IMflu

## 2010-12-24 NOTE — Progress Notes (Signed)
Summary: still sick  Phone Note Call from Patient Call back at Work Phone [phone]   Summary of Call: PATIENT LEFT MESSAGE ON VM:  - still not feeling well since last ov  - cough, chest & nasal congestion  - requesting another abx Shary Decamp  December 21, 2009 1:24 PM   Follow-up for Phone Call        was treated for presumed sinusitis, plan: Levaquin 750 mg one p.o. daily for 5 days (check with the pharmacy  ASAP, cost  may be an issue) office visit Monday for reevaluation an INR check   Follow-up by: [facility] E. Paz MD,  December 21, 2009 1:58 PM  Additional Follow-up for Phone Call Additional follow up Details #1::        Spoke with pt who still c/o drainage,pressure.  [facility]ed [facility] to inquire of cost of Levaquin cost =$35 for 5 pills. pt informed says thats OK  rx  faxed into [facility] [address] Additional Follow-up by: Kandice Hams,  December 21, 2009 2:48 PM    New/Updated Medications: LEVAQUIN 750 MG TABS (LEVOFLOXACIN) 1 by mouth once daily for 5 days Prescriptions: LEVAQUIN 750 MG TABS (LEVOFLOXACIN) 1 by mouth once daily for 5 days  #5 x 0   Entered by:   Kandice Hams   Authorized by:   Nolon Rod. Paz MD   Signed by:   Kandice Hams on 12/21/2009   Method used:   Faxed to ...       [facility]  [address] [phone]* (retail)       [address]       [place], [place]  [address]       Ph: [phone]       Fax: [phone]   RxID:   [id number]

## 2010-12-24 NOTE — Assessment & Plan Note (Signed)
Summary: congested/cbs   Vital Signs:  Patient profile:   60 year old female Weight:      195.50 pounds O2 Sat:      95 % on Room air Temp:     98.4 degrees F oral Pulse rate:   91 / minute Pulse rhythm:   regular BP sitting:   124 / 82  (left arm) Cuff size:   regular  Vitals Entered By: Army Fossa CMA (September 30, 2010 2:06 PM)  O2 Flow:  Room air CC: Pt here c/o chest congestion x 1 week Comments Trying Mucinex DM. [facility] [address]   History of Present Illness: since her last office visit, she is not  completely well Actually the sinus congestion  is slightly less, is now more congested in the chest, coughing some small amount of yellow sputum   Current Medications (verified): 1)  Zoloft 25 Mg Tabs (Sertraline Hcl) .... Take 1 Tablet By Mouth Once A Day 2)  Lasix 40 Mg Tabs (Furosemide) .... Take 1 Tablet By Mouth Once A Day 3)  Coumadin 5 Mg  Tabs (Warfarin Sodium) .... As Directed 4)  Coreg 12.5 Mg  Tabs (Carvedilol) .... Take One By Mouth Two Times A Day 5)  K-Tabs 10 Meq  Tbcr (Potassium Chloride) .... Take One By Mouth Two Times A Day 6)  Multivitamins   Caps (Multiple Vitamin) .... Take 1 Capsule By Mouth Once A Day 7)  Zolpidem Tartrate 10 Mg Tabs (Zolpidem Tartrate) .Marland Kitchen.. 1 By Mouth At Bedtime 8)  Lisinopril 10 Mg Tabs (Lisinopril) .Marland Kitchen.. 1 By Mouth Once Daily 9)  Flonase 50 Mcg/act Susp (Fluticasone Propionate) .... 2 Sprays Daily On Each Side of The Nose 10)  Hydrocodone-Acetaminophen 5-325 Mg Tabs (Hydrocodone-Acetaminophen) .... Take 1 Tab Every 6 Hours As Needed For Pain 11)  Protonix 40 Mg Tbec (Pantoprazole Sodium) .... One Before Breakfast Everyday 12)  Jantoven 2 Mg Tabs (Warfarin Sodium) .... As Directed. Needs Appt.  Allergies (verified): 1)  Keflex 2)  * ********latex*********  Past History:  Past Medical History: Reviewed history from 08/21/2010 and no changes required. Breast cancer, twice (first on the L breast 1982 w/ chest wall  involvment, then a second breast Ca on the R in 1987) Congestive heart failure, due to Adriamycin, s/p ICD-CARDIOMYOPATHY Depression Insomnia Headache Diverticulitis, hx of Osteopenia RENAL CALCULUS, HX OF (ICD-V13.01) HEADACHES Menopause since her 30s   Past Surgical History: Reviewed history from 11/27/2009 and no changes required. Mastectomy, B Oophorectomy B,  per a geneticists advise at [facility] Defibrillator-AICD Replaced 5/09  Review of Systems General:  Denies fever. CV:  Denies swelling of feet. Resp:  Denies shortness of breath; (+) wheezing and chest congestion (+) ant CP w/ cough . GI:  Denies nausea and vomiting.  Physical Exam  General:  alert and well-developed.  no distress Head:  face symmetric, slightly tender in the left maxillary sinuses and bilateral frontal sinuses Ears:  R ear normal and L ear normal.   Nose:  mildly congested Mouth:  no redness or discharge Lungs:  normal respiratory effort, no intercostal retractions, and no accessory muscle use.  few rhonchi with cough, increased expiratory time, very mild wheezing at the end of expiration   Impression & Recommendations:  Problem # 1:  URI (ICD-465.9) URI now with mild bronchitis and  possibly early sinusitis She is allergic to Keflex see  instructions  Complete Medication List: 1)  Zoloft 25 Mg Tabs (Sertraline hcl) .... Take 1 tablet by  mouth once a day 2)  Lasix 40 Mg Tabs (Furosemide) .... Take 1 tablet by mouth once a day 3)  Coumadin 5 Mg Tabs (Warfarin sodium) .... As directed 4)  Coreg 12.5 Mg Tabs (Carvedilol) .... Take one by mouth two times a day 5)  K-tabs 10 Meq Tbcr (Potassium chloride) .... Take one by mouth two times a day 6)  Multivitamins Caps (Multiple vitamin) .... Take 1 capsule by mouth once a day 7)  Zolpidem Tartrate 10 Mg Tabs (Zolpidem tartrate) .Marland Kitchen.. 1 by mouth at bedtime 8)  Lisinopril 10 Mg Tabs (Lisinopril) .Marland Kitchen.. 1 by mouth once daily 9)  Flonase 50 Mcg/act  Susp (Fluticasone propionate) .... 2 sprays daily on each side of the nose 10)  Hydrocodone-acetaminophen 5-325 Mg Tabs (Hydrocodone-acetaminophen) .... Take 1 tab every 6 hours as needed for pain 11)  Protonix 40 Mg Tbec (Pantoprazole sodium) .... One before breakfast everyday 12)  Jantoven 2 Mg Tabs (Warfarin sodium) .... As directed. needs appt. 13)  Zithromax Z-pak 250 Mg Tabs (Azithromycin) .... As directed  Patient Instructions: 1)  rest-fluids-tylenol 2)  robitussin DM  as needed 3)  Zithromax as prescribed 4)  ProAir 2 puffs every 6 hours as needed for wheezing and chest congestion (see  sample) 5)  Zithromax may change your Coumadin level, please   check it within 5 days 6)  call if not improving Prescriptions: ZITHROMAX Z-PAK 250 MG TABS (AZITHROMYCIN) as directed  #1 x 0   Entered and Authorized by:   Nolon Rod. Bellamie Turney MD   Signed by:   Nolon Rod. Liba Hulsey MD on 09/30/2010   Method used:   Electronically to        [facility] [phone]* (retail)       [address]       [place], [place]  [address]       Ph: [phone]       Fax: [phone]   RxID:   [id number]    Orders Added: 1)  Est. Patient Level III [24401]

## 2010-12-24 NOTE — Letter (Signed)
Summary: Remote Device Check  [facility], Main Office  [address]   [place], [place] [address]   Phone: [phone]  Fax: [phone]     February 11, 2010 MRN: [id number]   [facility] [address] [place], [place]  [address]   Dear [name],   Your remote transmission was recieved and reviewed by your physician.  All diagnostics were within normal limits for you.    ___X___Your next office visit is scheduled for:      JULY 2011 WITH DR Graciela Husbands. Please call our office to schedule an appointment.    Sincerely,  Proofreader

## 2010-12-24 NOTE — Letter (Signed)
Summary: Out of Work  [facility]  [address] [place], [place] [address]   Phone: [phone]  Fax: [phone]    September 30, 2010   Employee:  RHYLEI MCQUAIG [employer]    To Whom It May Concern:   For Medical reasons, please excuse the above named employee from work for the following dates:  Start:    Novermber 7, 2011  End:   October 02, 2010  If you need additional information, please feel free to contact our office.         Sincerely,    Willow Ora, MD

## 2010-12-24 NOTE — Letter (Signed)
Summary: Anticoagulation Modification Letter  [facility]  [address] [place], [place] [address]   Phone: [phone]  Fax: [phone]    June 27, 2010  Re:    [name] DOB:    1951/11/04 MRN:    [id number]    Dear Drue Novel,  We have scheduled the above patient for an endoscopic procedure. Our records show that  he/she is on anticoagulation therapy. Please advise as to how long the patient may come off their therapy of coumadin prior to the scheduled procedure(s) on pt to call back to schedule.   Please fax back/or route the completed form to Robin at [phone]  Thank you for your help with this matter.  Sincerely,  Merri Ray CMA Duncan Dull)   Physician Recommendation:    Hold Coumadin 5 days prior ____________

## 2010-12-24 NOTE — Progress Notes (Signed)
Summary: re; appt and INR//PAZ  Phone Note From Other Clinic   Caller: PAM FROM  GI  [phone] Summary of Call: PT WAS SEEN YESTERDAY BY NP AND WAS PUT ON FLAGYL, AND YOU PRESCRIBE COUMADIN FOR THIS PATIENT.  -WE NEED TO KNOW WHEN DO YOU SUGGEST WE CHECK HER INR? Marland KitchenKandice Hams  Apr 03, 2010 9:48 AM   Follow-up for Phone Call        INR in 1 week Jose E. Paz MD  Apr 03, 2010 1:19 PM     Scheduled lab order in [facility] for PT/INR and notified pt to come on 5-17 to come to the lab. Follow-up by: Joselyn Glassman,  Apr 04, 2010 11:31 AM

## 2010-12-24 NOTE — Letter (Signed)
Summary: Anticoagulation/[facility]  Anticoagulation/[facility]   Imported By: Sherian Rein 07/08/2010 12:37:18  _____________________________________________________________________  External Attachment:    Type:   Image     Comment:   External Document

## 2010-12-24 NOTE — Assessment & Plan Note (Signed)
Summary: ? DIVERTICULITIS FLARE           (DR.KAPLAN PT.)     [name]    History of Present Illness Visit Type: Follow-up Visit Primary GI MD: Melvia Heaps MD [facility] Primary Provider: Willow Ora, MD Chief Complaint: Patient complains that she is having lower abdominal pain that started yesterday afternoon. The pain is worse on the left side but radiates across the lower abd. She denies any other GI complaints.  History of Present Illness:   Patient is a 60 year old female followed by Dr. Arlyce Dice for recurrent diverticulitis. She has a history of ischemic cardiomyopathy / heart failure / ICD placement. Patient last treated for diverticulitis Jan. 2010. Yesterday developed diffuse lower abdominal pain, now localized to LLQ. No fevers. Pain remniscent of diverticultis. No bowel change.    GI Review of Systems    Reports abdominal pain.     Location of  Abdominal pain: LLQ.    Denies acid reflux, belching, bloating, chest pain, dysphagia with liquids, dysphagia with solids, heartburn, loss of appetite, nausea, vomiting, vomiting blood, weight loss, and  weight gain.        Denies anal fissure, black tarry stools, change in bowel habit, constipation, diarrhea, diverticulosis, fecal incontinence, heme positive stool, hemorrhoids, irritable bowel syndrome, jaundice, light color stool, liver problems, rectal bleeding, and  rectal pain.    Current Medications (verified): 1)  Zoloft 25 Mg Tabs (Sertraline Hcl) .... Take 1 Tablet By Mouth Once A Day 2)  Maxalt 10 Mg Tabs (Rizatriptan Benzoate) .... Take 1-2 Tablet By Mouth Every Two Hours 3)  Lasix 40 Mg Tabs (Furosemide) .... Take 1 Tablet By Mouth Once A Day 4)  Coumadin 5 Mg  Tabs (Warfarin Sodium) .... As Directed 5)  Coreg 12.5 Mg  Tabs (Carvedilol) .... Take One By Mouth Two Times A Day 6)  K-Tabs 10 Meq  Tbcr (Potassium Chloride) .... Take One By Mouth Two Times A Day 7)  Multivitamins   Caps (Multiple Vitamin) .... Take 1 Capsule By Mouth Once A  Day 8)  Zolpidem Tartrate 10 Mg Tabs (Zolpidem Tartrate) .Marland Kitchen.. 1 By Mouth At Bedtime 9)  Lisinopril 10 Mg Tabs (Lisinopril) .Marland Kitchen.. 1 By Mouth Once Daily 10)  Flonase 50 Mcg/act Susp (Fluticasone Propionate) .... 2 Sprays Daily On Each Side of The Nose  Allergies (verified): 1)  Keflex 2)  * ********latex*********  Past History:  Past Medical History: Last updated: 02/06/2010 Breast cancer, twice (first on the L breast 1982 w/ chest wall involvment, then a second breast Ca on the R in 1987) Congestive heart failure, due to Adriamycin, s/p ICD Depression Insomnia Headache Diverticulitis, hx of Osteopenia RENAL CALCULUS, HX OF (ICD-V13.01) HEADACHE Menopause since her 30s   Past Surgical History: Last updated: 11/27/2009 Mastectomy, B Oophorectomy B,  per a geneticists advise at [facility] Defibrillator-AICD Replaced 5/09  Family History: Reviewed history from 02/06/2010 and no changes required. MI--no DM--no Kidney Ca-- brother Colon Cancer:no Family History of Breast Cancer: Mother, Sister  Social History: Married related to Mr and Mrs [name] , they are my patients as well Never Smoked 3 kids Illicit Drug Use - no Patient does not get regular exercise but is very active  ETOH-- socially pre school director   Review of Systems  The patient denies allergy/sinus, anemia, anxiety-new, arthritis/joint pain, back pain, blood in urine, breast changes/lumps, change in vision, confusion, cough, coughing up blood, depression-new, fainting, fatigue, fever, headaches-new, hearing problems, heart murmur, heart rhythm changes, itching,  menstrual pain, muscle pains/cramps, night sweats, nosebleeds, pregnancy symptoms, shortness of breath, skin rash, sleeping problems, sore throat, swelling of feet/legs, swollen lymph glands, thirst - excessive , urination - excessive , urination changes/pain, urine leakage, vision changes, and voice change.    Vital Signs:  Patient profile:   60  year old female Height:      64.5 inches Weight:      201.8 pounds BMI:     34.23 Pulse rate:   80 / minute Pulse rhythm:   regular BP sitting:   92 / 60  (right arm) Cuff size:   regular  Vitals Entered By: Harlow Mares CMA Duncan Dull) (Apr 02, 2010 2:40 PM)  Physical Exam  General:  Well developed, well nourished, no acute distress. Head:  Normocephalic and atraumatic. Eyes:  Conjunctiva pink, no icterus.  Mouth:  No oral lesions. Tongue moist.  Neck:  no obvious masses  Lungs:  Clear throughout to auscultation. Heart:  RRR. Murmur. Abdomen:  Abdomen soft, nondistended. Moderate LLQ tenderness. No obvious masses or hepatomegaly.Normal bowel sounds.  Msk:  Symmetrical with no gross deformities. Normal posture. Neurologic:  Alert and  oriented x4;  grossly normal neurologically. Psych:  Alert and cooperative. Normal mood and affect.   Impression & Recommendations:  Problem # 1:  DIVERTICULITIS-COLON (ICD-562.11) Assessment Deteriorated Recurrent. No fevers or chills. Last episode Jan. 2010. Doesn't sound like patient has ever seen surgeon, probably because she would be high surgical risk. Will treat with Flagyl and Cipro. Given a few Hydrocodone for pain.  Follow up in couple of weeks. In meantime will call us ASAP or go to ER for fevers, chills or worsening abdominal pain. Patient on chronic Coumadin and INR will probably be affected by Flagyl. Will contact cardiologist tomorrow for their recommendations regarding INR check and will let patient know.   Problem # 2:  IMPLANTATION OF DEFIBRILLATOR, HX OF (ICD-V45.02) Assessment: Comment Only  Problem # 3:  CARDIOMYOPATHY, SECONDARY NOS (ICD-425.9) Assessment: Comment Only  Problem # 4:  COUMADIN THERAPY (ICD-V58.61) Assessment: Comment Only  Patient Instructions: 1)  We sent perscriptions for Cipro and Flagyl to your pharmacy, [facility] [address] [place]. 2)  We faxed the perscription for pain, Hydrocodone, 5/325mg  to [facility]. 3)  Stay on clear liquids for 3 days then advance to low residue diet, brochures given. 4)  We made you a follow up appointment to see Willette Cluster NP again for 04-15-10 at 3:30 PM.  5)  Copy sent to : Willow Ora, MD 6)  The medication list was reviewed and reconciled.  All changed / newly prescribed medications were explained.  A complete medication list was provided to the patient / caregiver. Prescriptions: HYDROCODONE-ACETAMINOPHEN 5-325 MG TABS (HYDROCODONE-ACETAMINOPHEN) Take 1 tab every 6 hours as needed for pain  #15 x 0   Entered by:   Lowry Ram NCMA   Authorized by:   Willette Cluster NP   Signed by:   Lowry Ram NCMA on 04/02/2010   Method used:   Printed then faxed to ...       [facility]  [address] [phone]* (retail)       [address]       [place], [place]  [address]       Ph: [phone]       Fax: [phone]   RxID:   [id number] FLAGYL 500 MG TABS (METRONIDAZOLE) Take 1 tab twice daily x 10 days  #20 x 0  Entered by:   Lowry Ram NCMA   Authorized by:   Willette Cluster NP   Signed by:   Lowry Ram NCMA on 04/02/2010   Method used:   Electronically to        [facility] [phone]* (retail)       [address]       [place], [place]  [address]       Ph: [phone]       Fax: [phone]   RxID:   [id number] CIPRO 500 MG TABS (CIPROFLOXACIN HCL) Take 1 tab twice daily x 10 days  #20 x 0   Entered by:   Lowry Ram NCMA   Authorized by:   Willette Cluster NP   Signed by:   Lowry Ram NCMA on 04/02/2010   Method used:   Electronically to        [facility] [phone]* (retail)       [address]       [place], [place]  [address]       Ph: [phone]       Fax: [phone]   RxID:   [id number]

## 2010-12-24 NOTE — Progress Notes (Signed)
Summary: calling about readings   Phone Note Call from Patient Call back at Home Phone [phone]   Caller: Patient Summary of Call: Pt calling regarding information on a reading Initial call taken by: Judie Grieve,  August 05, 2010 8:42 AM  Follow-up for Phone Call        This message was sent to the [facility]. Pt is not monitored in [facility]. Called spoke with pt, pt states she needs to speak with Gunnar Fusi about a question pertaining her defibrillator and a charge assoc with.  Has spoke with Gunnar Fusi reguarding this issue previously. Follow-up by: Cloyde Reams RN,  August 05, 2010 9:37 AM  Additional Follow-up for Phone Call Additional follow up Details #1::        Spoke with patient and she follows in office and remotely with Dr. Donnie Aho.  We canceled her from our [employer] and billing will removed the charge for June. Additional Follow-up by: Altha Harm, LPN,  August 06, 2010 4:17 PM

## 2010-12-24 NOTE — Miscellaneous (Signed)
Summary: REC COL/DIVERTICULITIS/COUMADIN...AS.   Clinical Lists Changes  Medications: Added new medication of MOVIPREP 100 GM  SOLR (PEG-KCL-NACL-NASULF-NA ASC-C) As directed - Signed Rx of MOVIPREP 100 GM  SOLR (PEG-KCL-NACL-NASULF-NA ASC-C) As directed;  #1 x 0;  Signed;  Entered by: Clide Cliff RN;  Authorized by: Louis Meckel MD;  Method used: Electronically to [id number]*, [address], [place], [place], [place]  [address], Ph: [phone], Fax: [phone] Observations: Added new observation of ALLERGY REV: Done (08/20/2010 15:14)    Prescriptions: MOVIPREP 100 GM  SOLR (PEG-KCL-NACL-NASULF-NA ASC-C) As directed  #1 x 0   Entered by:   Clide Cliff RN   Authorized by:   Louis Meckel MD   Signed by:   Clide Cliff RN on 08/20/2010   Method used:   Electronically to        [id number]* (retail)       [address]       [place], [place]  [address]       Ph: [phone]       Fax: [phone]   RxID:   [id number]

## 2010-12-24 NOTE — Progress Notes (Signed)
Summary: Rx for Ambien  Phone Note From Pharmacy   Caller: [facility]  [address] [phone]* Summary of Call: Pharmacist states that they did not receive the rx on 06/05/10 for Ambien 90 and 1 refills. She has been filling med off rx from November 2010, okay to call in 1 refill for pt? Army Fossa CMA  August 05, 2010 4:27 PM yes Nolon Rod. Paz MD  August 06, 2010 2:11 PM   Follow-up for Phone Call        Called in rx to pharmacy. Army Fossa CMA  August 06, 2010 2:17 PM     Prescriptions: ZOLPIDEM TARTRATE 10 MG TABS (ZOLPIDEM TARTRATE) 1 by mouth at bedtime  #90 x 0   Entered by:   Army Fossa CMA   Authorized by:   Nolon Rod. Paz MD   Signed by:   Army Fossa CMA on 08/06/2010   Method used:   Telephoned to ...       [facility]  [address] [phone]* (retail)       [address]       [place], [place]  [address]       Ph: [phone]       Fax: [phone]   RxID:   [id number]

## 2010-12-24 NOTE — Letter (Signed)
Summary: Device-Delinquent Check  [facility], Main Office  [address]   [place], [place] [address]   Phone: [phone]  Fax: [phone]     June 21, 2010 MRN: [id number]   [facility] [address] [place], [place]  [address]   Dear Ms. Burrough,  According to our records, you have not had your implanted device checked in the recommended period of time.  We are unable to determine appropriate device function without checking your device on a regular basis.  Please call our office to schedule an appointment with Dr. Graciela Husbands as soon as possible.  If you are having your device checked by another physician, please call us so that we may update our records.  Thank you,  Altha Harm, LPN  June 21, 2010 1:27 PM  [facility]

## 2010-12-24 NOTE — Progress Notes (Signed)
Summary: Triage-Diverticulitis   Phone Note Call from Patient Call back at Home Phone [phone]   Caller: Patient Call For: Dr. Arlyce Dice Summary of Call: pt. feels like she has had a "flareup" of her diverticulitis...abd is very tender and sore Initial call taken by: Karna Christmas,  May 30, 2010 12:08 PM  Follow-up for Phone Call        Last OV 04-02-10 for Diverticulitis. Pt. calling today with c/o abd. tenderness X 3-4 days, yesterday the pain got worse. Today she is tender to touch  and pain is getting worse. Chills since yesterday.  Pt. will remain on clear liquids and she will see Willette Cluster NP today at 3pm. Follow-up by: Laureen Ochs LPN,  May 30, 3473 1:03 PM

## 2010-12-24 NOTE — Progress Notes (Signed)
----   Converted from flag ---- ---- 04/03/2010 5:03 PM, Willette Cluster NP wrote: Yes please and please convert this to document. Thanks  ---- 04/03/2010 2:57 PM, Lowry Ram NCMA wrote: Dr Drue Novel responded and said to check it in 1 week.  Do you want me to put the lab order in IDX and I call pt to come to the lab.   ---- 04/02/2010 5:56 PM, Willette Cluster NP wrote: Elita Quick, would you call cards and ask when they want [place] to check INR as we started patient on Flagyl. Thanks ------------------------------

## 2010-12-24 NOTE — Progress Notes (Signed)
Summary: Refill Request  Phone Note Refill Request Message from:  Pharmacy on [facility] on [address] Fax #: [phone]  Refills Requested: Medication #1:  ZOLOFT 25 MG TABS Take 1 tablet by mouth once a day   Dosage confirmed as above?Dosage Confirmed   Brand Name Necessary? No   Supply Requested: 3 months   Last Refilled: 07/25/2009   Notes: Sertraline HC 25mg  tab, take 1 tablet by mouth daily Initial call taken by: Harold Barban,  January 14, 2010 11:42 AM    Prescriptions: ZOLOFT 25 MG TABS (SERTRALINE HCL) Take 1 tablet by mouth once a day  #90 x 1   Entered by:   Shary Decamp   Authorized by:   Nolon Rod. Paz MD   Signed by:   Shary Decamp on 01/14/2010   Method used:   Electronically to        [facility] [phone]* (retail)       [address]       [place], [place]  [address]       Ph: [phone]       Fax: [phone]   RxID:   [id number]

## 2010-12-24 NOTE — Progress Notes (Signed)
Summary: Medication   Phone Note Call from Patient Call back at Work Phone [phone]   Caller: Patient Call For: Dr. Arlyce Dice Reason for Call: Refill Medication Summary of Call: Needs a refill on her Cipro and Flagyl...Marland Kitchenstill having some problems diverticulitis  [facility] Initial call taken by: Karna Christmas,  September 03, 2010 9:28 AM  Follow-up for Phone Call        Dr Arlyce Dice, Pt needs refill on antibiotics is it ok to refill or does she need to be seen by Gunnar Fusi Follow-up by: Merri Ray CMA Duncan Dull),  September 03, 2010 9:42 AM  Additional Follow-up for Phone Call Additional follow up Details #1::        she should be seen in the office if she is still having pain Additional Follow-up by: Louis Meckel MD,  September 03, 2010 9:55 AM    Additional Follow-up for Phone Call Additional follow up Details #2::    Contacted pt to inform her she needed to be seen. Put her on Paulas schedule for today at 3pm. L/M for pt Follow-up by: Merri Ray CMA Duncan Dull),  September 03, 2010 10:19 AM

## 2010-12-24 NOTE — Assessment & Plan Note (Signed)
Summary: cold symptoms/cbs   Vital Signs:  Patient profile:   60 year old female Weight:      194 pounds Temp:     98.3 degrees F oral Pulse rate:   88 / minute Pulse rhythm:   regular BP sitting:   122 / 80  (left arm) Cuff size:   regular  Vitals Entered By: Army Fossa CMA (September 23, 2010 3:59 PM) CC: Pt here c/o sinus infection? Comments nasal congestion pressure under eyes both ears hurt started on friday [facility] [address]    History of Present Illness: as above   Current Medications (verified): 1)  Zoloft 25 Mg Tabs (Sertraline Hcl) .... Take 1 Tablet By Mouth Once A Day 2)  Lasix 40 Mg Tabs (Furosemide) .... Take 1 Tablet By Mouth Once A Day 3)  Coumadin 5 Mg  Tabs (Warfarin Sodium) .... As Directed 4)  Coreg 12.5 Mg  Tabs (Carvedilol) .... Take One By Mouth Two Times A Day 5)  K-Tabs 10 Meq  Tbcr (Potassium Chloride) .... Take One By Mouth Two Times A Day 6)  Multivitamins   Caps (Multiple Vitamin) .... Take 1 Capsule By Mouth Once A Day 7)  Zolpidem Tartrate 10 Mg Tabs (Zolpidem Tartrate) .Marland Kitchen.. 1 By Mouth At Bedtime 8)  Lisinopril 10 Mg Tabs (Lisinopril) .Marland Kitchen.. 1 By Mouth Once Daily 9)  Flonase 50 Mcg/act Susp (Fluticasone Propionate) .... 2 Sprays Daily On Each Side of The Nose 10)  Hydrocodone-Acetaminophen 5-325 Mg Tabs (Hydrocodone-Acetaminophen) .... Take 1 Tab Every 6 Hours As Needed For Pain 11)  Protonix 40 Mg Tbec (Pantoprazole Sodium) .... One Before Breakfast Everyday 12)  Jantoven 2 Mg Tabs (Warfarin Sodium) .... As Directed. Needs Appt.  Allergies (verified): 1)  Keflex 2)  * ********latex*********  Past History:  Past Medical History: Reviewed history from 08/21/2010 and no changes required. Breast cancer, twice (first on the L breast 1982 w/ chest wall involvment, then a second breast Ca on the R in 1987) Congestive heart failure, due to Adriamycin, s/p ICD-CARDIOMYOPATHY Depression Insomnia Headache Diverticulitis, hx of Osteopenia RENAL  CALCULUS, HX OF (ICD-V13.01) HEADACHES Menopause since her 30s   Past Surgical History: Reviewed history from 11/27/2009 and no changes required. Mastectomy, B Oophorectomy B,  per a geneticists advise at [facility] Defibrillator-AICD Replaced 5/09  Review of Systems General:  Denies fatigue and fever. Resp:  (+) cough, some clear phlegm . GI:  Denies nausea and vomiting. MS:  Denies muscle aches.  Physical Exam  General:  alert and well-developed.   Head:  face symmetric, nontender to palpation Ears:  R ear normal and L ear normal.   Nose:  slightly congested Mouth:  no redness Lungs:  normal respiratory effort, no intercostal retractions, and no accessory muscle use.   Heart:  normal rate and regular rhythm.  + syst murmur    Impression & Recommendations:  Problem # 1:  URI (ICD-465.9) see instructions   Complete Medication List: 1)  Zoloft 25 Mg Tabs (Sertraline hcl) .... Take 1 tablet by mouth once a day 2)  Lasix 40 Mg Tabs (Furosemide) .... Take 1 tablet by mouth once a day 3)  Coumadin 5 Mg Tabs (Warfarin sodium) .... As directed 4)  Coreg 12.5 Mg Tabs (Carvedilol) .... Take one by mouth two times a day 5)  K-tabs 10 Meq Tbcr (Potassium chloride) .... Take one by mouth two times a day 6)  Multivitamins Caps (Multiple vitamin) .... Take 1 capsule by mouth once a day 7)  Zolpidem Tartrate 10 Mg Tabs (Zolpidem tartrate) .Marland Kitchen.. 1 by mouth at bedtime 8)  Lisinopril 10 Mg Tabs (Lisinopril) .Marland Kitchen.. 1 by mouth once daily 9)  Flonase 50 Mcg/act Susp (Fluticasone propionate) .... 2 sprays daily on each side of the nose 10)  Hydrocodone-acetaminophen 5-325 Mg Tabs (Hydrocodone-acetaminophen) .... Take 1 tab every 6 hours as needed for pain 11)  Protonix 40 Mg Tbec (Pantoprazole sodium) .... One before breakfast everyday 12)  Jantoven 2 Mg Tabs (Warfarin sodium) .... As directed. needs appt.  Patient Instructions: 1)  rest-fluids-tylenol 2)  robitussin DM  as needed 3)   flonase 2 sprays on each side of the nose until samples gone 4)  cll if no better in few days, high fever, increased chest congestion    Orders Added: 1)  Est. Patient Level III [[address]]

## 2010-12-24 NOTE — Progress Notes (Signed)
Summary: appt---lmom  10/12, WAS SEEN 10/20  ---- Converted from flag ---- ---- 08/13/2010 3:57 PM, Jose E. Paz MD wrote: due for INR check ------------------------------  Please Schedule. Army Fossa CMA  September 03, 2010 1:41 PM       Additional Follow-up for Phone Call Additional follow up Details #2::    lmom to call back to schedule INR CHECK appointment.Marland KitchenMarland KitchenJerolyn Shin  September 04, 2010 12:31 PM  Additional Follow-up for Phone Call Additional follow up Details #3:: Details for Additional Follow-up Action Taken: CAME IN FOR APPT ON 10/20 Additional Follow-up by: Jerolyn Shin,  September 17, 2010 10:01 AM

## 2010-12-24 NOTE — Progress Notes (Signed)
Summary: CT Ordered   ---- Converted from flag ---- ---- 08/22/2010 9:48 AM, Amy S Esterwood PA-c wrote: [name],  PLEASE CALL Alejandra  Kirby -PT OF [facility]  I SAW YESTERDAY WITH DIVERTICULITITELL HER I REVIEWED EVERYTHING WITH [facility], AND WE WOULD LIKE HER T  LIKE HER TO HAVE A CT ABD/PELVIS TO DOCUMENT DIOVERTICULITIS. ALSO  REVIEWED HER LAST ECHO OF HEART ETC, AND DO NOT THINK SHE WOULD BE GREAT SURGICAL CANDIDATE FOR COLON-SO  IF CT OK SHE DOES NOT NEED TO HAVE ANOTHER COLONOSCOPY AND WE CAN CANCEL IT  . SCHEDULE THE CT FOR TOMORROW IF U CAN ,AND THEN WE WILL CALL HER WITH RESULT-  THANKS! ------------------------------  Phone Note Outgoing Call Call back at Home Phone [phone] Call back at [phone]   Call placed by: Laureen Ochs LPN,  August 22, 2010 10:45 AM Call placed to: Patient Summary of Call: Above PA orders reviewed with patient. CT  is scheduled at [facility] CT on 08-23-10 at 4pm. Instructions reviewed w/pt. by phone and she will pick-up contrast and written instructions today. Pt. instructed to call back as needed.   Initial call taken by: Laureen Ochs LPN,  August 22, 2010 11:00 AM

## 2010-12-24 NOTE — Assessment & Plan Note (Signed)
Summary: pt check//lch   Nurse Visit   Vital Signs:  Patient profile:   60 year old female Height:      64 inches Weight:      197.25 pounds Temp:     98.2 degrees F oral BP sitting:   90 / 50  (left arm) Cuff size:   regular CC: PT check/kb   Allergies: 1)  Keflex 2)  * ********latex********* Laboratory Results   Blood Tests   Date/Time Received: Lucious Groves [facility]  October 22, 2010 4:18 PM  Date/Time Reported: Lucious Groves CMA  October 22, 2010 4:18 PM    INR: 2.6   (Normal Range: 0.88-1.12   Therap INR: 2.0-3.5) Comments: Patient notes that she has 2 and 5mg  tabs at home, she takes 5mg  on MWF and 7mg  all other days. She was advised to continue the same dose and  re-check in 4 weeks. Lucious Groves CMA  October 22, 2010 4:20 PM  agree Nolon Rod. Paz MD  October 23, 2010 4:04 PM     Orders Added: 1)  Est. Patient Level I [11914] 2)  Protime [[id number]]

## 2010-12-24 NOTE — Letter (Signed)
Summary: Out of Work  [facility]  [address] [place], [place] [address]   Phone: [phone]  Fax: [phone]    Apr 02, 2010   Employee:  JOANI COS[employer] [facility] [employer]    To Whom It May Concern:   For Medical reasons, please excuse the above named employee from work for the following dates:  Start:   04-02-10  End:   04-03-10  If you need additional information, please feel free to contact our office.         Sincerely,

## 2010-12-24 NOTE — Miscellaneous (Signed)
Summary: dx correction   Clinical Lists Changes  Problems: Changed problem from CONGESTIVE HEART FAILURE (ICD-428.00) to CONGESTIVE HEART FAILURE (ICD-428.0) changed the incorrect dx code to correct dx code Genella Mech  September 20, 2010 1:56 PM

## 2010-12-24 NOTE — Progress Notes (Signed)
Summary: Triage Diverticulitis flare  Medications Added CIPRO 500 MG TABS (CIPROFLOXACIN HCL) 1 by mouth two times a day FOR 10 DAYS FLAGYL 250 MG TABS (METRONIDAZOLE) 1 by mouth three times a day FOR 10 DAYS       Phone Note Outgoing Call Call back at [facility] Phone [phone]   Call placed by: Merri Ray CMA Duncan Dull),  August 20, 2010 4:06 PM Summary of Call: This pt seems to think she is in a diverticulitis flare, Abdominal started last night. Wants to know if she can have a medication for her flare called in. her colonoscopy is scheduled for 08/30/2010 at 4pm. Initial call taken by: Merri Ray CMA Duncan Dull),  August 20, 2010 4:08 PM  Follow-up for Phone Call        Begin cipro 500mg  two times a day, flagyl 250mg  three times a day need OV Follow-up by: Louis Meckel MD,  August 20, 2010 4:29 PM  Additional Follow-up for Phone Call Additional follow up Details #1::        Pt to come in to see [facility] tomorrow 9/28 at 2 pm.  Additional Follow-up by: Merri Ray CMA Duncan Dull),  August 20, 2010 4:54 PM    New/Updated Medications: CIPRO 500 MG TABS (CIPROFLOXACIN HCL) 1 by mouth two times a day FOR 10 DAYS FLAGYL 250 MG TABS (METRONIDAZOLE) 1 by mouth three times a day FOR 10 DAYS Prescriptions: FLAGYL 250 MG TABS (METRONIDAZOLE) 1 by mouth three times a day FOR 10 DAYS  #30 x 0   Entered by:   Merri Ray CMA (AAMA)   Authorized by:   Louis Meckel MD   Signed by:   Merri Ray CMA (AAMA) on 08/20/2010   Method used:   Electronically to        [facility] [phone]* (retail)       [address]       [place], [place]  [address]       Ph: [phone]       Fax: [phone]   RxID:   [id number] CIPRO 500 MG TABS (CIPROFLOXACIN HCL) 1 by mouth two times a day FOR 10 DAYS  #20 x 0   Entered by:   Merri Ray CMA (AAMA)   Authorized by:   Louis Meckel MD   Signed by:   Merri Ray CMA (AAMA) on  08/20/2010   Method used:   Electronically to        [facility] [phone]* (retail)       [address]       [place], [place]  [address]       Ph: [phone]       Fax: [phone]   RxID:   [id number]

## 2010-12-24 NOTE — Letter (Signed)
Summary: Cancer Screening/[facility] Personalized Risk Profile  Cancer Screening/[facility] Personalized Risk Profile   Imported By: Lanelle Bal 02/11/2010 13:50:01  _____________________________________________________________________  External Attachment:    Type:   Image     Comment:   External Document

## 2010-12-24 NOTE — Letter (Signed)
Summary: Remote Device Check  [facility], Main Office  [address]   [place], [place] [address]   Phone: [phone]  Fax: [phone]     June 06, 2010 MRN: [id number]   [facility] [address] [place], [place]  [address]   Dear Ms. Kidd,   Your remote transmission was recieved and reviewed by your physician.  All diagnostics were within normal limits for you.   __X____Your next office visit is scheduled for:   July 2011 with Dr Graciela Husbands. Please call our office to schedule an appointment.    Sincerely,  Vella Kohler

## 2010-12-24 NOTE — Cardiovascular Report (Signed)
Summary: Office Visit Remote   Office Visit Remote   Imported By: Roderic Ovens 06/07/2010 16:14:55  _____________________________________________________________________  External Attachment:    Type:   Image     Comment:   External Document

## 2010-12-24 NOTE — Progress Notes (Signed)
Summary: Migraines  Phone Note Call from Patient Call back at [facility] Phone [phone] Call back at Work Phone [phone]   Caller: Patient Reason for Call: Refill Medication Summary of Call: Patient called and said she has had some migranes this weekend and Dr. Rondel Jumbo used to prescripe Maxalt-MLT 10mg . Pharmacy is [facility] on [address]  Initial call taken by: Harold Barban,  June 24, 2010 8:52 AM  Follow-up for Phone Call        please get paper chart If headaches severe please let me know (patient is on Coumadin) Branson Kranz E. Aaralynn Shepheard MD  June 24, 2010 5:46 PM   Additional Follow-up for Phone Call Additional follow up Details #1::        Left message with Husband (?) to have her give the office a call back today. Army Fossa CMA  June 25, 2010 9:11 AM     Additional Follow-up for Phone Call Additional follow up Details #2::    I spoke with pt and she said she is not having any Nasuea or vomitting. They come and go. She said she has not had them in a long time and they are now coming back. Army Fossa CMA  June 25, 2010 9:21 AM --------------------------------- chart reviewed, in the past she got   Zomig for headaches at this point I'll recommend to avoid triptans due to her history of heart disease (although not ischemic) Use Vicodin as needed for headaches Please arrange a  CT of the head without contrast material (dx headache, patient on Coumadin) Arrange office visit with me within the next few days, patient to call if the headaches become more severe Dexton Zwilling E. Korea Severs MD  June 25, 2010 10:02 AM    Additional Follow-up for Phone Call Additional follow up Details #3:: Details for Additional Follow-up Action Taken: Pt is aware to use the Vicodin. She is okay with this. She is going to call to make an OV to see you. She states she had a CT done in April. She is okay with getting another one if it is really necassary. Pleasea advise. Army Fossa CMA  June 25, 2010  10:08 AM  I agree, I now see that she had recently   a CT. please be sure she comes back in the next few days Jestine Bicknell E. Artis Beggs MD  June 25, 2010 1:54 PM   Pt is aware will check schedule and call and make an appt. Army Fossa CMA  June 25, 2010 2:19 PM

## 2010-12-24 NOTE — Assessment & Plan Note (Signed)
Summary: possible diverticulitis flare/rs    History of Present Illness Visit Type: Follow-up Visit Primary GI MD: Melvia Heaps MD [facility] Primary Provider: Willow Ora, MD Chief Complaint: diverticular disease History of Present Illness:   PLEASANT 59 YO FEMALE KNOWN TO DR. KAPLAN. SHE HAS HX OF RECURRENT DIVERTICULITIS WITH 5-6 EPISODES OVER THE PAST 3 YEARS-SHE HAS NOT BEEN HOSPITALIZED. SHE WAS SEEN IN AUGUST,AND SCHEDULED FOR A FOLLOW UP COLONOSCOPY NEXT WEEK. LAST COLONOSCOPY WAS IN 2003 AND SHOWED LEFT SIDED DIVERTICULITIS,NO POLYPS.  SHE CALLED IN YESTERDAY WITH LOWER ABDOMINAL PAIN CONSISTENT WITH DIVERTICULITIS. SHE SAYS SHE STARTED HAVING LOWER ABDOMINAL  DISCOMFORT LAST WEEK  , WHICH HAS NOW SETTLED INTO HER LEFT LOWER ABDOMEN..NO FEVER, NO N/V. SHE HAS HAD SOME INCREASE IN DISCOMFORT POST -PRANDIALLY. NO MELENA OR HEME AND BM'S NORMAL. SHE STARTED CIPRO AND FLAGYL LAST PM. PAIN IS CONSTANT DULL AND ACHING.  SHE DOES HAVE A HX OF A CARDIOMYOPATHY,IS ON CHRONIC COUMADIN,AND HAS A DEFIBRILLATOR.   GI Review of Systems    Reports abdominal pain, bloating, and  nausea.     Location of  Abdominal pain: lower abdomen.    Denies acid reflux, belching, chest pain, dysphagia with liquids, dysphagia with solids, heartburn, loss of appetite, vomiting, vomiting blood, weight loss, and  weight gain.      Reports diarrhea.     Denies anal fissure, black tarry stools, change in bowel habit, constipation, diverticulosis, fecal incontinence, heme positive stool, hemorrhoids, irritable bowel syndrome, jaundice, light color stool, liver problems, rectal bleeding, and  rectal pain.    Current Medications (verified): 1)  Zoloft 25 Mg Tabs (Sertraline Hcl) .... Take 1 Tablet By Mouth Once A Day 2)  Lasix 40 Mg Tabs (Furosemide) .... Take 1 Tablet By Mouth Once A Day 3)  Coumadin 5 Mg  Tabs (Warfarin Sodium) .... As Directed 4)  Coreg 12.5 Mg  Tabs (Carvedilol) .... Take One By Mouth Two Times A Day 5)   K-Tabs 10 Meq  Tbcr (Potassium Chloride) .... Take One By Mouth Two Times A Day 6)  Multivitamins   Caps (Multiple Vitamin) .... Take 1 Capsule By Mouth Once A Day 7)  Zolpidem Tartrate 10 Mg Tabs (Zolpidem Tartrate) .Marland Kitchen.. 1 By Mouth At Bedtime 8)  Lisinopril 10 Mg Tabs (Lisinopril) .Marland Kitchen.. 1 By Mouth Once Daily 9)  Flonase 50 Mcg/act Susp (Fluticasone Propionate) .... 2 Sprays Daily On Each Side of The Nose 10)  Hydrocodone-Acetaminophen 5-325 Mg Tabs (Hydrocodone-Acetaminophen) .... Take 1 Tab Every 6 Hours As Needed For Pain 11)  Protonix 40 Mg Tbec (Pantoprazole Sodium) .... One Before Breakfast Everyday 12)  Cipro 500 Mg Tabs (Ciprofloxacin Hcl) .Marland Kitchen.. 1 By Mouth Two Times A Day For 10 Days 13)  Flagyl 250 Mg Tabs (Metronidazole) .Marland Kitchen.. 1 By Mouth Three Times A Day For 10 Days  Allergies (verified): 1)  Keflex 2)  * ********latex*********  Past History:  Past Medical History: Breast cancer, twice (first on the L breast 1982 w/ chest wall involvment, then a second breast Ca on the R in 1987) Congestive heart failure, due to Adriamycin, s/p ICD-CARDIOMYOPATHY Depression Insomnia Headache Diverticulitis, hx of Osteopenia RENAL CALCULUS, HX OF (ICD-V13.01) HEADACHES Menopause since her 30s   Past Surgical History: Reviewed history from 11/27/2009 and no changes required. Mastectomy, B Oophorectomy B,  per a geneticists advise at [facility] Defibrillator-AICD Replaced 5/09  Family History: Reviewed history from 02/06/2010 and no changes required. MI--no DM--no Kidney Ca-- brother Colon Cancer:no Family History of Breast  Cancer: Mother, Sister  Social History: Reviewed history from 04/02/2010 and no changes required. Married related to Mr and [name] , they are my patients as well Never Smoked 3 kids Illicit Drug Use - no Patient does not get regular exercise but is very active  ETOH-- socially pre school director   Review of Systems  The patient denies allergy/sinus,  anemia, anxiety-new, arthritis/joint pain, back pain, blood in urine, breast changes/lumps, change in vision, confusion, cough, coughing up blood, depression-new, fainting, fatigue, fever, headaches-new, hearing problems, heart murmur, heart rhythm changes, itching, menstrual pain, muscle pains/cramps, night sweats, nosebleeds, pregnancy symptoms, shortness of breath, skin rash, sleeping problems, sore throat, swelling of feet/legs, swollen lymph glands, thirst - excessive , urination - excessive , urination changes/pain, urine leakage, vision changes, and voice change.         SEE HPI  Vital Signs:  Patient profile:   60 year old female Height:      64 inches Weight:      198.25 pounds Temp:     98.7 degrees F oral Pulse rate:   68 / minute Pulse rhythm:   regular BP sitting:   96 / 58  (right arm) Cuff size:   regular  Vitals Entered By: June McMurray CMA Duncan Dull) (August 21, 2010 2:03 PM)  Physical Exam  General:  Well developed, well nourished, no acute distress. Head:  Normocephalic and atraumatic. Eyes:  PERRLA, no icterus. Chest Wall:  DEFIBRILLATOR LEFT CHEST WALL Lungs:  Clear throughout to auscultation. Heart:  heart murmur systolic:.   Abdomen:  SOFT, TENDER LLQ WITH SOME GUARDING, NO REBOUND, NO MASS OR HSM,BS+ Rectal:  NOT DONE Neurologic:  Alert and  oriented x4;  grossly normal neurologically. Psych:  Alert and cooperative. Normal mood and affect.   Impression & Recommendations:  Problem # 1:  DIVERTICULITIS-COLON (ICD-562.11) Assessment Deteriorated 59 YO FEMALE WITH RECURRENT DIVERTICULITIS.   PT SCHEDULED FOR COLONOSCOPY NEXT WEEK.  COMPLETE 10 DAY COURSE OF CIPRO 500 MG TWICE DAILY, AND FLAGYL 250 MG 3 TIMES DAILY. SHE IS ASKED TO CALL IF SHE HAS ANY RESIDUAL PAIN AFTER COMPLETING ABX. HAVE PT CHECKED NEXT WEEK  BECAUSE OF ABX.   RESCHEDULE COLONOSCOPY FOR 2-3 WEEKS FOM TODAY,TO ALLOW TIME TO RESOLVE THE DIVERTICULITIS. SHE WILL HOLD HER COUMADIN 5 DAYS  PRIOR TO PROCEDURE AS PREVIOUSLY CLEARED THRU DR. PAZ.  Problem # 2:  CONGESTIVE HEART FAILURE (ICD-428.00) Assessment: Unchanged CARDIOMYOPATHY-FELT CHEMO INDUCED   ON COUMADIN S/P DEFIBRILLATOR 2009 LAST ECHO AVAILABLE 2009 SHOWED EF OF 20%. - AND THEREFORE  A POOR SURGICAL CANDIDATE- WILL REVIEW  WITH DR. KAPLAN REGARDING PLANS FOR PROCEDURE AND SURGICAL REFERRAL.  Problem # 3:  BREAST CANCER, HX OF (ICD-V10.3) Assessment: Unchanged  Patient Instructions: 1)  Finish current round of antibiotics, call the office if symptoms still present when finished. 2)  Get ProTime checked next week. 3)  Colonoscopy scheduled with Dr. Arlyce Dice for 08/30/2010 cancelled, 4)  rescheduled 09/17/2010 @2 :30PM 5)  Stop taking Coumadin 5 days prior. 6)  The medication list was reviewed and reconciled.  All changed / newly prescribed medications were explained.  A complete medication list was provided to the patient / caregiver.

## 2010-12-24 NOTE — Letter (Signed)
Summary: Viann Fish MD Cardiology  Viann Fish MD Cardiology   Imported By: Lanelle Bal 03/29/2010 10:38:05  _____________________________________________________________________  External Attachment:    Type:   Image     Comment:   External Document

## 2010-12-24 NOTE — Assessment & Plan Note (Signed)
Summary: cpx/ns/kdc   Vital Signs:  Patient profile:   60 year old female Height:      64.5 inches Weight:      202.8 pounds BMI:     34.40 Pulse rate:   82 / minute Pulse rhythm:   regular BP sitting:   122 / 71  (left arm)  Vitals Entered By: Shary Decamp (February 06, 2010 2:41 PM) CC: cpx, pap   History of Present Illness: CPX  Current Medications (verified): 1)  Zoloft 25 Mg Tabs (Sertraline Hcl) .... Take 1 Tablet By Mouth Once A Day 2)  Maxalt 10 Mg Tabs (Rizatriptan Benzoate) .... Take 1-2 Tablet By Mouth Every Two Hours 3)  Lasix 40 Mg Tabs (Furosemide) .... Take 1 Tablet By Mouth Once A Day 4)  Coumadin 5 Mg  Tabs (Warfarin Sodium) .... As Directed 5)  Coreg 12.5 Mg  Tabs (Carvedilol) .... Bid 6)  K-Tabs 10 Meq  Tbcr (Potassium Chloride) .... Bid 7)  Multivitamins   Caps (Multiple Vitamin) .... Take 1 Capsule By Mouth Once A Day 8)  Zolpidem Tartrate 10 Mg Tabs (Zolpidem Tartrate) .Marland Kitchen.. 1 By Mouth At Bedtime 9)  Lisinopril 10 Mg Tabs (Lisinopril) .Marland Kitchen.. 1 By Mouth Once Daily 10)  Flonase 50 Mcg/act Susp (Fluticasone Propionate) .... 2 Sprays Daily On Each Side of The Nose  Allergies (verified): 1)  Keflex 2)  * ********latex*********  Past History:  Past Medical History: Breast cancer, twice (first on the L breast 1982 w/ chest wall involvment, then a second breast Ca on the R in 1987) Congestive heart failure, due to Adriamycin, s/p ICD Depression Insomnia Headache Diverticulitis, hx of Osteopenia RENAL CALCULUS, HX OF (ICD-V13.01) HEADACHE Menopause since her 30s   Past Surgical History: Reviewed history from 11/27/2009 and no changes required. Mastectomy, B Oophorectomy B,  per a geneticists advise at [facility] Defibrillator-AICD Replaced 5/09  Family History: MI--no DM--no Kidney Ca-- brother Colon Cancer:no Family History of Breast Cancer: Mother, Sister  Social History: Reviewed history from 11/27/2008 and no changes  required. Married related to Mr and [name] , they are my patients as well Never Smoked Alcohol use-no 3 kids Illicit Drug Use - no Patient does not get regular exercise but is very active  ETOH-- socially pre school director   Review of Systems General:  Denies fatigue, fever, and weight loss; she does self chest exam, has not noted  any changes. CV:  Denies swelling of feet; occasionally palpitations, cards aware, occasionally lightheaded, at rest or w/ activity, no CP, no near syncope . Resp:  Denies cough and shortness of breath. GI:  Denies bloody stools, diarrhea, nausea, and vomiting. GU:  Denies dysuria and hematuria; no hot flashes no vag d/c or spotting . Psych:  Denies anxiety and depression.  Physical Exam  General:  alert and well-developed.   Neck:  no masses and no thyromegaly.   Chest Wall:  chest wall examined, postsurgical changes noted , no particular induration that caught  my attention Lungs:  normal respiratory effort, no intercostal retractions, no accessory muscle use, and normal breath sounds.   Heart:  normal rate and regular rhythm.  + syst murmur  Abdomen:  soft, non-tender, no distention, no masses, no guarding, and no rigidity.   Genitalia:  normal introitus, no vaginal discharge, mucosa pink and moist, no vaginal or cervical lesions, no vaginal atrophy, and no friaility or hemorrhage.  bimanual exam without mass or tenderness Extremities:  no edema Psych:  Oriented X3,  memory intact for recent and remote, normally interactive, good eye contact, not anxious appearing, and not depressed appearing.     Impression & Recommendations:  Problem # 1:  HEALTH SCREENING (ICD-V70.0) Td 03 pneumonia 08  Cscope 2003, next ? will contact Dr Arlyce Dice  PAP (-) 8--2009, re-done today chest exam normal  DEXA 2006, osteopenia  DEXA 06-2008: normal  counseled about diet exercise  Problem # 2:  VENTRICULAR TACHYCARDIA (ICD-427.1) Ocasional  palpitations,  encouraged her to discuss with cardiology.  If palpitations severe, go to the ER Her updated medication list for this problem includes:    Coumadin 5 Mg Tabs (Warfarin sodium) .Marland Kitchen... As directed    Coreg 12.5 Mg Tabs (Carvedilol) ..... Bid  Problem # 3:  RENAL CALCULUS, HX OF (ICD-V13.01) history of a renal stone, used to be followed by urology, wonders if she needs that checked. She is asymptomatic Will do x-ray  Complete Medication List: 1)  Zoloft 25 Mg Tabs (Sertraline hcl) .... Take 1 tablet by mouth once a day 2)  Maxalt 10 Mg Tabs (Rizatriptan benzoate) .... Take 1-2 tablet by mouth every two hours 3)  Lasix 40 Mg Tabs (Furosemide) .... Take 1 tablet by mouth once a day 4)  Coumadin 5 Mg Tabs (Warfarin sodium) .... As directed 5)  Coreg 12.5 Mg Tabs (Carvedilol) .... Bid 6)  K-tabs 10 Meq Tbcr (Potassium chloride) .... Bid 7)  Multivitamins Caps (Multiple vitamin) .... Take 1 capsule by mouth once a day 8)  Zolpidem Tartrate 10 Mg Tabs (Zolpidem tartrate) .Marland Kitchen.. 1 by mouth at bedtime 9)  Lisinopril 10 Mg Tabs (Lisinopril) .Marland Kitchen.. 1 by mouth once daily 10)  Flonase 50 Mcg/act Susp (Fluticasone propionate) .... 2 sprays daily on each side of the nose  Other Orders: Protime (04540JW) Fingerstick (11914) T-1 View Abdomen (KUB) (74000TC)  Patient Instructions: 1)  LABS  2)  came back fasting 3)  FLP, CBC, TSH, AST, ALT , Vit D Dx V70 4)  BMP BNP 427.1, 425.9 - fax results to Dr. Donnie Aho 5)  continue with the same dose of Coumadin, recheck in 4 weeks 6)  full up ,  routine office visit in 6 months   Preventive Care Screening  Prior Values:    Pap Smear:  normal (06/27/2008)    Colonoscopy:  Done (04/29/2002)    Last Tetanus Booster:  Td (03/14/2002)   Laboratory Results   Blood Tests      INR: 2.6   (Normal Range: 0.88-1.12   Therap INR: 2.0-3.5) Comments: CURRENT DOSE:  7 MG daily, except 5mg  on tues & thurs No change recheck in 4 weeks Shary Decamp  February 06, 2010  2:55 PM       Patient Instructions: 1)  LABS  2)  came back fasting 3)  FLP, CBC, TSH, AST, ALT , Vit D Dx V70 4)  BMP BNP 427.1, 425.9 - fax results to Dr. Donnie Aho 5)  continue with the same dose of Coumadin, recheck in 4 weeks 6)  full up ,  routine office visit in 6 months  Appended Document: cpx/ns/kdc next colonoscopy due 04/2012

## 2010-12-24 NOTE — Progress Notes (Signed)
Summary: colonoscopy due 04/2012  ---- Converted from flag ---- ---- 02/11/2010 9:23 AM, Laureen Ochs LPN wrote: Recall is in IDX for 04/2012.   ---- 02/11/2010 9:18 AM, Shary Decamp wrote: ---- 02/08/2010 1:26 PM, Nolon Rod. Kobyn Kray MD wrote: please contact Dr Arlyce Dice nurse, next Cscope? ------------------------------

## 2010-12-24 NOTE — Procedures (Signed)
Summary: [facility] Device [facility]     Allergies: 1)  Keflex 2)  * ********latex*********  ICD Specifications Following MD:  Sherryl Manges, MD     ICD Vendor:  [employer]     ICD Model Number:  [id number]     ICD Serial Number:  [id number] ICD DOI:  03/24/2008     ICD Implanting MD:  Sherryl Manges, MD  Lead 1:    Location: RA     DOI: 05/24/2004     Model #: [id number]     Serial #: [id number]     Status: active Lead 2:    Location: RV     DOI: 05/24/2004     Model #: [id number]     Serial #: [id number]     Status: active Lead 3:    Location: LV     DOI: 05/24/2004     Model #: [id number]     Serial #: [id number]     Status: capped  Indications::  NICM  Explantation Comments: 03-24-04 BSX H135 EXPLANTED  ICD Specs Remote Check?  No Battery Voltage:  3.20 V     Charge Time:  7.2 seconds       ICD Device Measurements Atrium:  Amplitude: 3.8 mV, Impedance: 434 ohms, Threshold: 0.8 V at 0.4 msec Right Ventricle:  Amplitude: 25 mV, Impedance: 569 ohms, Threshold: 1.0 V at 0.4 msec Left Ventricle:   Episodes MS Episodes:  0     Shock:  0     ATP:  0     Nonsustained:  0     Atrial Pacing:  0%     Ventricular Pacing:  0%  Brady Parameters Mode DDD     Lower Rate Limit:  40     Upper Rate Limit 110 PAV 300      Tachy Zones VF:  210     VT:  180     VT1:  130     Tech Comments:  30 episodes NSVT in monitor only zone Checked by industry for research

## 2010-12-24 NOTE — Cardiovascular Report (Signed)
Summary: Office Visit Remote   Office Visit Remote   Imported By: Roderic Ovens 11/29/2009 12:31:25  _____________________________________________________________________  External Attachment:    Type:   Image     Comment:   External Document

## 2010-12-24 NOTE — Assessment & Plan Note (Signed)
Summary: DIVERTICULITIS FLARE           (DR.KAPLAN PT.)    [name]   History of Present Illness:   Patient is a 60 year old female with a history of ischemic cardiomyopathy / heart failure / ICD placement. She is on chronic Coumadin. Patient followed by Dr. Arlyce Dice for history of recurrent diverticulitis for which she was last treated by [place] early May. Three days ago Ms. Wiens developed recurrent LLQ tenderness which evolved into significant pain last night. Pain constant, remniiscent of diverticulitis. Some chills yesterday, temp 99.0 in office now. BMs fine. No rectal bleeding. No nausea   GI Review of Systems    Reports abdominal pain.     Location of  Abdominal pain: LLQ.    Denies acid reflux, belching, bloating, chest pain, dysphagia with liquids, dysphagia with solids, heartburn, loss of appetite, nausea, vomiting, vomiting blood, weight loss, and  weight gain.      Reports diverticulosis.     Denies anal fissure, black tarry stools, change in bowel habit, constipation, diarrhea, fecal incontinence, heme positive stool, hemorrhoids, irritable bowel syndrome, jaundice, light color stool, liver problems, rectal bleeding, and  rectal pain.    Current Medications (verified): 1)  Zoloft 25 Mg Tabs (Sertraline Hcl) .... Take 1 Tablet By Mouth Once A Day 2)  Lasix 40 Mg Tabs (Furosemide) .... Take 1 Tablet By Mouth Once A Day 3)  Coumadin 5 Mg  Tabs (Warfarin Sodium) .... As Directed 4)  Coreg 12.5 Mg  Tabs (Carvedilol) .... Take One By Mouth Two Times A Day 5)  K-Tabs 10 Meq  Tbcr (Potassium Chloride) .... Take One By Mouth Two Times A Day 6)  Multivitamins   Caps (Multiple Vitamin) .... Take 1 Capsule By Mouth Once A Day 7)  Zolpidem Tartrate 10 Mg Tabs (Zolpidem Tartrate) .Marland Kitchen.. 1 By Mouth At Bedtime 8)  Lisinopril 10 Mg Tabs (Lisinopril) .Marland Kitchen.. 1 By Mouth Once Daily 9)  Flonase 50 Mcg/act Susp (Fluticasone Propionate) .... 2 Sprays Daily On Each Side of The Nose 10)   Hydrocodone-Acetaminophen 5-325 Mg Tabs (Hydrocodone-Acetaminophen) .... Take 1 Tab Every 6 Hours As Needed For Pain 11)  Protonix 40 Mg Tbec (Pantoprazole Sodium) .... One Before Breakfast Everyday 12)  Metronidazole 500 Mg Tabs (Metronidazole) .... Take 1 Tab Two Times A Day X 10 Days 13)  Cipro 500 Mg Tabs (Ciprofloxacin Hcl) .... Take 1 Tab Twice Dailyx 10 Days  Allergies (verified): 1)  Keflex 2)  * ********latex*********  Past History:  Past Medical History: Reviewed history from 02/06/2010 and no changes required. Breast cancer, twice (first on the L breast 1982 w/ chest wall involvment, then a second breast Ca on the R in 1987) Congestive heart failure, due to Adriamycin, s/p ICD Depression Insomnia Headache Diverticulitis, hx of Osteopenia RENAL CALCULUS, HX OF (ICD-V13.01) HEADACHE Menopause since her 30s   Past Surgical History: Reviewed history from 11/27/2009 and no changes required. Mastectomy, B Oophorectomy B,  per a geneticists advise at [facility] Defibrillator-AICD Replaced 5/09  Family History: Reviewed history from 02/06/2010 and no changes required. MI--no DM--no Kidney Ca-- brother Colon Cancer:no Family History of Breast Cancer: Mother, Sister  Social History: Reviewed history from 04/02/2010 and no changes required. Married related to Mr and [name] , they are my patients as well Never Smoked 3 kids Illicit Drug Use - no Patient does not get regular exercise but is very active  ETOH-- socially pre school director   Review of Systems  The patient denies allergy/sinus, anemia, anxiety-new, arthritis/joint pain, back pain, blood in urine, breast changes/lumps, change in vision, confusion, cough, coughing up blood, depression-new, fainting, fatigue, fever, headaches-new, hearing problems, heart murmur, heart rhythm changes, itching, menstrual pain, muscle pains/cramps, night sweats, nosebleeds, pregnancy symptoms, shortness of breath, skin  rash, sleeping problems, sore throat, swelling of feet/legs, swollen lymph glands, thirst - excessive , urination - excessive , urination changes/pain, urine leakage, vision changes, and voice change.    Vital Signs:  Patient profile:   60 year old female Height:      64 inches Weight:      198 pounds BMI:     34.11 Temp:     99.1 degrees F oral Pulse rate:   68 / minute Pulse rhythm:   regular BP sitting:   110 / 68  (left arm)  Vitals Entered By: Lowry Ram NCMA (May 30, 2010 3:13 PM)  Physical Exam  General:  Well developed, well nourished, no acute distress. Head:  Normocephalic and atraumatic. Eyes:  Conjunctiva pink, no icterus.  Mouth:  No oral lesions. Tongue moist.  Neck:  no obvious masses  Lungs:  Clear throughout to auscultation. Heart:  Regular rate and rhythm; no murmurs, rubs,  or bruits. Abdomen:  Abdomen soft, nondistended. Moderate LLQ tenderness.  No obvious masses. Normal bowel sounds.  Msk:  Symmetrical with no gross deformities. Normal posture. Extremities:  No palmar erythema, no edema.  Neurologic:  Alert and  oriented x4;  grossly normal neurologically. Skin:  Intact without significant lesions or rashes. Cervical Nodes:  No significant cervical adenopathy. Psych:  Alert and cooperative. Normal mood and affect.   Impression & Recommendations:  Problem # 1:  DIVERTICULITIS-COLON (ICD-562.11) Assessment Deteriorated Recurrent. Reviewing records, looks like patient has been treated at least five times since 2008. No fevers or chills. Patient hasn't had surgical evaluation,  probably because she is high surgical risk. Will treat with Flagyl and Cipro. Patient on chronic Coumadin. Since Flagyl can afftect  PT / INR will ask patient to come here in one week for INR check. Clear liquids for now.  Mesalamine may be of some benefit in preventing future episodes of diverticulitis, Dr. Arlyce Dice can can discuss that with patient when she returns for follow  up.  Problem # 2:  COUMADIN THERAPY (ICD-V58.61) Assessment: Comment Only INR in one week.  Patient Instructions: 1)  We sent prescriptions for Cipro and Flagyl  to [facility], [place]. 2)  Stay on clear liquid diet for several days then slowly advance your diet as tolerated. 3)  We made you a follow up visit with Dr Arlyce Dice for 06-27-10. Appointment card given. 4)  The medication list was reviewed and reconciled.  All changed / newly prescribed medications were explained.  A complete medication list was provided to the patient / caregiver. Prescriptions: CIPRO 500 MG TABS (CIPROFLOXACIN HCL) Take 1 tab twice dailyx 10 days  #20 x 0   Entered by:   Lowry Ram NCMA   Authorized by:   Willette Cluster NP   Signed by:   Lowry Ram NCMA on 05/30/2010   Method used:   Electronically to        [facility] [phone]* (retail)       [address]       [place], [place]  [address]       Ph: [phone]       Fax: [phone]  RxID:   [id number] METRONIDAZOLE 500 MG TABS (METRONIDAZOLE) take 1 tab two times a day x 10 days  #20 x 0   Entered by:   Lowry Ram NCMA   Authorized by:   Willette Cluster NP   Signed by:   Lowry Ram NCMA on 05/30/2010   Method used:   Electronically to        [facility] [phone]* (retail)       [address]       [place], [place]  [address]       Ph: [phone]       Fax: [phone]   RxID:   [id number]

## 2010-12-24 NOTE — Assessment & Plan Note (Signed)
Summary: dizzy/alr   Vital Signs:  Patient profile:   60 year old female Height:      64.5 inches Weight:      201 pounds BMI:     34.09 Pulse rate:   60 / minute BP sitting:   100 / 68  Vitals Entered By: R.Peeler CC: dizzyspells/headaches   History of Present Illness: dizzines going on "x a while" more noticeable x 3 weeks: episodes are on and off, described as swimmy head,  last few minutes, not necessarily triggered by moving or moving her head but when she is already dizzzy, moving around does increase the symptoms. only in one or two instances, symptoms were associated with nausea, no vomiting  when asked, she admits to headache for the last 5 days.  Headache is global, somehow different from her migraines and not necessarily associated with nausea  ROS denies chest pain, palpitations no recent URI or cold no fainting or loss of consciousness but she does feel weak  and "not feeling well" sometimes ( can't described her symptoms any further) no history of strokes her ICD is remotely check every few weeks, no apparent abnormalities   Allergies: 1)  Keflex 2)  * ********latex*********  Past History:  Past Medical History: Reviewed history from 02/06/2010 and no changes required. Breast cancer, twice (first on the L breast 1982 w/ chest wall involvment, then a second breast Ca on the R in 1987) Congestive heart failure, due to Adriamycin, s/p ICD Depression Insomnia Headache Diverticulitis, hx of Osteopenia RENAL CALCULUS, HX OF (ICD-V13.01) HEADACHE Menopause since her 30s   Past Surgical History: Reviewed history from 11/27/2009 and no changes required. Mastectomy, B Oophorectomy B,  per a geneticists advise at [facility] Defibrillator-AICD Replaced 5/09  Social History: Reviewed history from 02/06/2010 and no changes required. Married related to Mr and [name] , they are my patients as well Never Smoked Alcohol use-no 3 kids Illicit Drug Use -  no Patient does not get regular exercise but is very active  ETOH-- socially pre school director   Review of Systems      See HPI  Physical Exam  General:  alert and well-developed.   Neck:  no masses and normal carotid upstroke.   Lungs:  normal respiratory effort, no intercostal retractions, no accessory muscle use, and normal breath sounds.   Heart:  normal rate and regular rhythm.  + syst murmur  Extremities:  no pretibial edema bilaterally  Neurologic:  alert & oriented X3, cranial nerves II-XII intact, strength normal in all extremities, gait normal, and DTRs symmetrical and normal.   Psych:  Oriented X3, good eye contact, not anxious appearing, and not depressed appearing.     Impression & Recommendations:  Problem # 1:  DIZZINESS (ICD-780.4) symptoms nonspecific given description, Hg screening only slightly  lower than usual she is   on Coumadin and having headaches. Will get a CT of the head to rule out a ICB if the CT is negative, will recommend observation and reassessment if symptoms increase or neurology referral Will also contact cardiology : --let them know about the  symptoms since they  check  her  ICD (?arrhythmia) --last echo? last carotid ultrasound? Orders: Protime ([id number]) Hgb (85018) Fingerstick (11914) CT without Contrast (CT w/o contrast)  Complete Medication List: 1)  Zoloft 25 Mg Tabs (Sertraline hcl) .... Take 1 tablet by mouth once a day 2)  Maxalt 10 Mg Tabs (Rizatriptan benzoate) .... Take 1-2 tablet by mouth every two  hours 3)  Lasix 40 Mg Tabs (Furosemide) .... Take 1 tablet by mouth once a day 4)  Coumadin 5 Mg Tabs (Warfarin sodium) .... As directed 5)  Coreg 12.5 Mg Tabs (Carvedilol) .... Bid 6)  K-tabs 10 Meq Tbcr (Potassium chloride) .... Bid 7)  Multivitamins Caps (Multiple vitamin) .... Take 1 capsule by mouth once a day 8)  Zolpidem Tartrate 10 Mg Tabs (Zolpidem tartrate) .Marland Kitchen.. 1 by mouth at bedtime 9)  Lisinopril 10 Mg Tabs  (Lisinopril) .Marland Kitchen.. 1 by mouth once daily 10)  Flonase 50 Mcg/act Susp (Fluticasone propionate) .... 2 sprays daily on each side of the nose   Patient Instructions: 1)  same Coumadin dose, recheck in 4 weeks    Laboratory Results   Blood Tests      INR: 2.9   (Normal Range: 0.88-1.12   Therap INR: 2.0-3.5)  CBC   HGB:  12.6 g/dL   (Normal Range: 11.9-14.7 in Males, 12.0-15.0 in Females) Comments: Coumadin 7mg  daily except 5mg  on tues & fri Shary Decamp  March 06, 2010 11:05 AM no change, 4 weeks, patient aware Shaniquia Brafford E. Vayda Dungee MD  March 06, 2010 11:36 AM      Appended Document: dizzy/alr echocardiogram was performed 03/21/2009, fax is too light to be scanned, did show a EF of  30-35%, technically difficult study

## 2010-12-24 NOTE — Letter (Signed)
Summary: Previsit letter  [facility]  [address] [place], [place] [address]   Phone: [phone]  Fax: [phone]       06/27/2010 MRN: [id number]  [facility] [address] [place], [place]  [address]  Dear [name],  Welcome to the Gastroenterology Division at [facility].    You are scheduled to see a nurse for your pre-procedure visit on 08/20/2010 at 3:30PM on the 3rd floor at [facility], [address]  We ask that you try to arrive at our office 15 minutes prior to your appointment time to allow for check-in.  Your nurse visit will consist of discussing your medical and surgical history, your immediate family medical history, and your medications.    Please bring a complete list of all your medications or, if you prefer, bring the medication bottles and we will list them.  We will need to be aware of both prescribed and over the counter drugs.  We will need to know exact dosage information as well.  If you are on blood thinners (Coumadin, Plavix, Aggrenox, Ticlid, etc.) please call our office today/prior to your appointment, as we need to consult with your physician about holding your medication.   Please be prepared to read and sign documents such as consent forms, a financial agreement, and acknowledgement forms.  If necessary, and with your consent, a friend or relative is welcome to sit-in on the nurse visit with you.  Please bring your insurance card so that we may make a copy of it.  If your insurance requires a referral to see a specialist, please bring your referral form from your primary care physician.  No co-pay is required for this nurse visit.     If you cannot keep your appointment, please call [phone] to cancel or reschedule prior to your appointment date.  This allows [place] the opportunity to schedule an appointment for another patient in need of care.    Thank you for choosing [facility] for your  medical needs.  We appreciate the opportunity to care for you.  Please visit [place] at our website  to learn more about our practice.                     Sincerely.                                                                                                                   The Gastroenterology Division

## 2010-12-24 NOTE — Progress Notes (Signed)
Summary: Follow up letter   Phone Note Call from Patient   Caller: Patient Summary of Call: Patient follows with Dr. Donnie Aho. Initial call taken by: Altha Harm, LPN,  June 25, 2010 4:38 PM

## 2010-12-24 NOTE — Assessment & Plan Note (Signed)
Summary: acute/cough,congestion [name]   Vital Signs:  Patient profile:   60 year old female Height:      64.5 inches Weight:      204.6 pounds BMI:     34.70 Temp:     98.4 degrees F BP sitting:   110 / 80  Vitals Entered By: Shary Decamp (November 27, 2009 9:53 AM) CC: acute only Comments  - cough, nasal congestion x several weeks  - head feels like it is "going to blow up" Shary Decamp  November 27, 2009 9:54 AM    History of Present Illness: cough,severe at time. has chest and nasal congestion x several weeks occasional chest pain with cough head feels like it is "going to blow up"    Current Medications (verified): 1)  Zoloft 25 Mg Tabs (Sertraline Hcl) .... Take 1 Tablet By Mouth Once A Day 2)  Maxalt 10 Mg Tabs (Rizatriptan Benzoate) .... Take 1-2 Tablet By Mouth Every Two Hours 3)  Lasix 40 Mg Tabs (Furosemide) .... Take 1 Tablet By Mouth Once A Day 4)  Coumadin 5 Mg  Tabs (Warfarin Sodium) .... As Directed 5)  Coreg 12.5 Mg  Tabs (Carvedilol) .... Bid 6)  K-Tabs 10 Meq  Tbcr (Potassium Chloride) .... Bid 7)  Multivitamins   Caps (Multiple Vitamin) .... Take 1 Capsule By Mouth Once A Day 8)  Zolpidem Tartrate 10 Mg Tabs (Zolpidem Tartrate) .Marland Kitchen.. 1 By Mouth At Bedtime 9)  Lisinopril 10 Mg Tabs (Lisinopril) .Marland Kitchen.. 1 By Mouth Once Daily  Allergies (verified): 1)  Keflex 2)  * ********latex*********  Past History:  Past Medical History: Reviewed history from 06/27/2008 and no changes required. Breast cancer, twice (first on the L breast 1982 w/ chest wall involvment, then a second breast Ca on the R in 1987) Congestive heart failure, due to Adriamycin, s/p ICD Depression Insomnia Headache Diverticulitis, hx of Osteopenia RENAL CALCULUS, HX OF (ICD-V13.01) HEADACHE  Past Surgical History: Mastectomy, B Oophorectomy B,  per a geneticists advise at [facility] Defibrillator-AICD Replaced 5/09  Review of Systems General:  Denies chills and fever; (+) tiredness  . GI:  Denies diarrhea, nausea, and vomiting. MS:  Denies muscle aches.  Physical Exam  General:  alert and well-developed.  no apparent distress Head:  face symmetric, slightly tender at both maxillary sinuses Ears:  R ear normal and L ear normal.   Nose:  slightly congested Mouth:  no redness or discharge Heart:  normal rate, regular rhythm, no murmur, and no gallop.     Impression & Recommendations:  Problem # 1:  SINUSITIS - ACUTE-NOS (ICD-461.9) patient is on Coumadin. She is allergic to Keflex. Will try amoxicillin, which may affect her INR, see instructions  Her updated medication list for this problem includes:    Amoxicillin 500 Mg Caps (Amoxicillin) .Marland Kitchen..Marland Kitchen Two tablets twice a day for 10 days    Flonase 50 Mcg/act Susp (Fluticasone propionate) .Marland Kitchen... 2 sprays daily on each side of the nose    Cheratussin Ac 100-10 Mg/2ml Syrp (Guaifenesin-codeine) ..... One or 2 teaspoons at bedtime as needed for cough  Complete Medication List: 1)  Zoloft 25 Mg Tabs (Sertraline hcl) .... Take 1 tablet by mouth once a day 2)  Maxalt 10 Mg Tabs (Rizatriptan benzoate) .... Take 1-2 tablet by mouth every two hours 3)  Lasix 40 Mg Tabs (Furosemide) .... Take 1 tablet by mouth once a day 4)  Coumadin 5 Mg Tabs (Warfarin sodium) .... As directed 5)  Coreg  12.5 Mg Tabs (Carvedilol) .... Bid 6)  K-tabs 10 Meq Tbcr (Potassium chloride) .... Bid 7)  Multivitamins Caps (Multiple vitamin) .... Take 1 capsule by mouth once a day 8)  Zolpidem Tartrate 10 Mg Tabs (Zolpidem tartrate) .Marland Kitchen.. 1 by mouth at bedtime 9)  Lisinopril 10 Mg Tabs (Lisinopril) .Marland Kitchen.. 1 by mouth once daily 10)  Amoxicillin 500 Mg Caps (Amoxicillin) .... Two tablets twice a day for 10 days 11)  Flonase 50 Mcg/act Susp (Fluticasone propionate) .... 2 sprays daily on each side of the nose 12)  Cheratussin Ac 100-10 Mg/36ml Syrp (Guaifenesin-codeine) .... One or 2 teaspoons at bedtime as needed for cough  Other Orders: Protime  ([id number]) Fingerstick ([id number])  Patient Instructions: 1)  rest, fluids, Tylenol. 2)  Amoxicillin, antibiotic for 10 days. 3)  Flonase to help  nasal congestion. 4)  for cough: mucinex DM OTC,  if the cough continue, take codeine  5)  call if not better in a few days. 6)  Next Coumadin check in one week (due to the antibiotics)  7)  due for a complete physical, please schedule at your earliest convenience Prescriptions: CHERATUSSIN AC 100-10 MG/5ML SYRP (GUAIFENESIN-CODEINE) one or 2 teaspoons at bedtime as needed for cough  #200cc x 0   Entered and Authorized by:   Nolon Rod. Paz MD   Signed by:   Nolon Rod. Paz MD on 11/27/2009   Method used:   Print then Give to Patient   RxID:   [id number] FLONASE 50 MCG/ACT SUSP (FLUTICASONE PROPIONATE) 2 sprays daily on each side of the nose  #1 x 1   Entered and Authorized by:   Elita Quick E. Paz MD   Signed by:   Nolon Rod. Paz MD on 11/27/2009   Method used:   Electronically to        [facility] [id number]* (retail)       [address]       [place], [place]  [address]       Ph: [phone]       Fax: [phone]   RxID:   [id number] AMOXICILLIN 500 MG CAPS (AMOXICILLIN) two tablets twice a day for 10 days  #40 x 0   Entered and Authorized by:   Nolon Rod. Paz MD   Signed by:   Nolon Rod. Paz MD on 11/27/2009   Method used:   Electronically to        [facility] [id number]* (retail)       [address]       [place], [place]  [address]       Ph: [phone]       Fax: [phone]   RxID:   [id number]   Laboratory Results   Blood Tests     PT: 17.3 s   (Normal Range: 10.6-13.4)  INR: 2.0   (Normal Range: 0.88-1.12   Therap INR: 2.0-3.5) Comments: CURRENT DOSE - 7MG  DAILY EXCEPT 5MG  ON TUES & THURS NO CHANGE RECHECK IN 1  WEEKS [name]  November 27, 2009 9:59 AM

## 2010-12-24 NOTE — Progress Notes (Signed)
Summary: holding coumadin  Phone Note Outgoing Call   Summary of Call: I spoke with Dr. Tawana Scale Nurse they are not following her PT/INR. She last had it check here on 06/18/10 with Dr.Hopper. Is it okay to hold her Coumadin for her endoscopic procedure since we are following her PT/INR? Army Fossa CMA  July 04, 2010 8:45 AM   Follow-up for Phone Call        okay to hold Coumadin for 5 days as recommended by GI Jose E. Paz MD  July 04, 2010 1:18 PM   Additional Follow-up for Phone Call Additional follow up Details #1::        Will fax forms back over to GI. Army Fossa CMA  July 04, 2010 1:22 PM      Appended Document: holding coumadin Pt aware ok to hold coumadin

## 2010-12-24 NOTE — Progress Notes (Signed)
Summary: Refill Request  Phone Note Refill Request Call back at [phone] Message from:  Pharmacy on June 05, 2010 9:45 AM  Refills Requested: Medication #1:  ZOLPIDEM TARTRATE 10 MG TABS 1 by mouth at bedtime   Dosage confirmed as above?Dosage Confirmed   Supply Requested: 3 months   Last Refilled: 01/14/2010 [facility]  Next Appointment Scheduled: Sept 19th 2011 Initial call taken by: Lavell Islam,  June 05, 2010 9:46 AM  Follow-up for Phone Call        ok 90 and 1 RF Tenee Wish E. Kane Kusek MD  June 05, 2010 1:49 PM     Prescriptions: ZOLPIDEM TARTRATE 10 MG TABS (ZOLPIDEM TARTRATE) 1 by mouth at bedtime  #90 x 1   Entered by:   Army Fossa CMA   Authorized by:   Nolon Rod. Shjon Lizarraga MD   Signed by:   Army Fossa CMA on 06/05/2010   Method used:   Printed then faxed to ...       [facility]  [address] [phone]* (retail)       [address]       [place], [place]  [address]       Ph: [phone]       Fax: [phone]   RxID:   [id number]

## 2010-12-24 NOTE — Cardiovascular Report (Signed)
Summary: Office Visit Remote   Office Visit Remote   Imported By: Roderic Ovens 02/11/2010 14:00:36  _____________________________________________________________________  External Attachment:    Type:   Image     Comment:   External Document

## 2010-12-24 NOTE — Assessment & Plan Note (Signed)
Summary: COUMIDIN CHECK//[facility]   Nurse Visit   Allergies: 1)  Keflex 2)  * ********latex********* Laboratory Results   Blood Tests   Date/Time Received: Lucious Groves [facility]  September 12, 2010 3:57 PM  Date/Time Reported: Lucious Groves CMA  September 12, 2010 3:57 PM    INR: 3.0   (Normal Range: 0.88-1.12   Therap INR: 2.0-3.5) Comments: Patient notes that she has 5mg  tabs at home. She states that she thinks she takes 5mg  on M,W,F and 7mg  all other days. I made patient aware I will call at home to confirm her pills. Patient denies all adverse events. Please advise. Lucious Groves CMA  September 12, 2010 3:59 PM   Patient has 5mg  and 2mg  tabs at home. She take 5mg   on MWF and 7mg  all other days. Please advise. Lucious Groves CMA  September 13, 2010 8:02 AM  no change, 4 weeks  Jose E. Paz MD  September 13, 2010 9:15 AM  Patient notified and will back for appt. Lucious Groves CMA  September 13, 2010 11:54 AM     Orders Added: 1)  Est. Patient Level I [99211] 2)  Protime [[id number]]    ANTICOAGULATION RECORD PREVIOUS REGIMEN & LAB RESULTS Anticoagulation Diagnosis:  V45.02 on  01/19/2009 Previous INR Goal Range:  2-3 on  01/19/2009 Previous INR:  3.3 on  08/12/2010 Previous Coumadin Dose(mg):  7 tues, thurs, 5 all other day on  01/19/2009  Previous Coagulation Comments:  NEW: skip  2 days  5mg  once daily  7mg  Monday Friday recheck 2 weeks on  06/27/2008  NEW REGIMEN & LAB RESULTS Anticoag. Dx: Atrial fibrillation Current INR: 3.0 Current Coumadin Dose(mg): 5mg  Regimen:   (no change)  Provider: Willow Ora  Anticoagulation Visit Questionnaire Coumadin dose missed/changed:  No Abnormal Bleeding Symptoms:  No  Any diet changes including alcohol intake, vegetables or greens since the last visit:  No Any illnesses or hospitalizations since the last visit:  No Any signs of clotting since the last visit (including chest discomfort, dizziness, shortness of breath, arm tingling, slurred speech,  swelling or redness in leg):  No Adverse Events: Patient denies all adverse events./kb  MEDICATIONS ZOLOFT 25 MG TABS (SERTRALINE HCL) Take 1 tablet by mouth once a day LASIX 40 MG TABS (FUROSEMIDE) Take 1 tablet by mouth once a day COUMADIN 5 MG  TABS (WARFARIN SODIUM) as directed COREG 12.5 MG  TABS (CARVEDILOL) take one by mouth two times a day K-TABS 10 MEQ  TBCR (POTASSIUM CHLORIDE) take one by mouth two times a day MULTIVITAMINS   CAPS (MULTIPLE VITAMIN) Take 1 capsule by mouth once a day ZOLPIDEM TARTRATE 10 MG TABS (ZOLPIDEM TARTRATE) 1 by mouth at bedtime LISINOPRIL 10 MG TABS (LISINOPRIL) 1 by mouth once daily FLONASE 50 MCG/ACT SUSP (FLUTICASONE PROPIONATE) 2 sprays daily on each side of the nose HYDROCODONE-ACETAMINOPHEN 5-325 MG TABS (HYDROCODONE-ACETAMINOPHEN) Take 1 tab every 6 hours as needed for pain PROTONIX 40 MG TBEC (PANTOPRAZOLE SODIUM) one before breakfast everyday CIPRO 500 MG TABS (CIPROFLOXACIN HCL) 1 by mouth two times a day FOR 10 DAYS FLAGYL 250 MG TABS (METRONIDAZOLE) 1 by mouth three times a day FOR 10 DAYS JANTOVEN 2 MG TABS (WARFARIN SODIUM) as directed. NEEDS APPT.

## 2010-12-24 NOTE — Progress Notes (Signed)
Summary: ZOLPIDEM 10 MG REFILL  Phone Note Refill Request Message from:  Fax from Pharmacy on August 05, 2010 3:32 PM  Refills Requested: Medication #1:  ZOLPIDEM TARTRATE 10 MG TABS 1 by mouth at bedtime Julious Oka  [phone]  Initial call taken by: Jerolyn Shin,  August 05, 2010 3:32 PM  Follow-up for Phone Call        Pt is aware she has a refill on file at [facility]. Army Fossa CMA  August 05, 2010 3:58 PM

## 2010-12-24 NOTE — Letter (Signed)
Summary: Work Dietitian at [employer]  [address] [place], [place] [address]   Phone: [phone]  Fax: [phone]    Today's Date: March 06, 2010  Name of Patient: [name]  The above named patient had a medical visit today.  Please take this into consideration when reviewing the time away from work/school.    Special Instructions:  [  ] None  [  x] To be off the remainder of today, returning to the normal work / school schedule tomorrow.  [  ] To be off until the next scheduled appointment on ______________________.  [  ] Other ________________________________________________________________ ________________________________________________________________________   Sincerely yours,   Shary Decamp

## 2010-12-24 NOTE — Progress Notes (Signed)
Summary: update and cancelation   Phone Note Call from Patient Call back at Work Phone [phone] Call back at [phone]   Caller: Patient Call For: Willette Cluster Reason for Call: Talk to Nurse Summary of Call: pt canceled appt for today due to death in family... would like to discuss when to resch with Pam and give update Initial call taken by: Vallarie Mare,  Apr 15, 2010 8:29 AM  Follow-up for Phone Call        SPoke to pt and she said she is much better. She is still having slight pain in the lower left side of the abdomen.  She didn't know if Gunnar Fusi would want her on another round of antibiotics or what.  She had a death in family that is why she cancelled the appt for today.   Follow-up by: Joselyn Glassman,  Apr 15, 2010 2:14 PM  Additional Follow-up for Phone Call Additional follow up Details #1::        Gunnar Fusi said to tell her when she calls [place] on Tues of next week after the holiday, that she should see Dr Arlyce Dice since it has been awhile especially if she has another flare.  She doesn't think she needs any more antibiotic right now. Additional Follow-up by: Joselyn Glassman,  Apr 17, 2010 4:41 PM

## 2010-12-24 NOTE — Progress Notes (Signed)
Summary: Medication refill   Phone Note Call from Patient Call back at Home Phone [phone]   Caller: Patient Call For: Willette Cluster Reason for Call: Talk to Nurse Summary of Call: refill on her Hydrocodone.[facility] Initial call taken by: Karna Christmas,  June 03, 2010 12:06 PM  Follow-up for Phone Call        ok Follow-up by: Louis Meckel MD,  June 04, 2010 10:33 AM    Prescriptions: HYDROCODONE-ACETAMINOPHEN 5-325 MG TABS (HYDROCODONE-ACETAMINOPHEN) Take 1 tab every 6 hours as needed for pain  #15 x 0   Entered by:   Lowry Ram NCMA   Authorized by:   Louis Meckel MD   Signed by:   Lowry Ram NCMA on 06/05/2010   Method used:   Printed then faxed to ...       [facility]  [phone]* (retail)       [address]       [place], [place]  [address]       Ph: [phone]       Fax: [phone]   RxID:   [id number]

## 2010-12-24 NOTE — Progress Notes (Signed)
  Phone Note Call from Patient   Caller: Patient Summary of Call: c/o feeling dizzy 3 nights ago,  says feel funny light headed, pressure top of head.  Ov scheduled .Kandice Hams  March 06, 2010 8:38 AM  Initial call taken by: Kandice Hams,  March 06, 2010 8:38 AM

## 2010-12-24 NOTE — Miscellaneous (Signed)
Summary: ECHO   Clinical Lists Changes  Observations: Added new observation of ECHOINTERP:  Ejection Fraction:  >=30% to 39%. Technically difficult study.  LF not well seen int parasternal views.  Abnormal LV function with abnormal septal motion, global hypokinesis & EF of 30-35%.  Mild LA enlargement.  Mild to moderate aortic regurgitation.  Dopplerevidenced of decreased LV compliance.   INTERPRETING PHYSICIAN:  W. Viann Fish, MD     (03/21/2009 7:49)      Echocardiogram  Procedure date:  03/21/2009  Findings:       Ejection Fraction:  >=30% to 39%. Technically difficult study.  LF not well seen int parasternal views.  Abnormal LV function with abnormal septal motion, global hypokinesis & EF of 30-35%.  Mild LA enlargement.  Mild to moderate aortic regurgitation.  Dopplerevidenced of decreased LV compliance.   INTERPRETING PHYSICIAN:  W. Viann Fish, MD       Echocardiogram  Procedure date:  03/21/2009  Findings:       Ejection Fraction:  >=30% to 39%. Technically difficult study.  LF not well seen int parasternal views.  Abnormal LV function with abnormal septal motion, global hypokinesis & EF of 30-35%.  Mild LA enlargement.  Mild to moderate aortic regurgitation.  Dopplerevidenced of decreased LV compliance.   INTERPRETING PHYSICIAN:  W. Viann Fish, MD

## 2010-12-24 NOTE — Letter (Signed)
Summary: Anticoagulation Letter/[facility]  Anticoagulation Letter/[facility]   Imported By: Lanelle Bal 07/11/2010 09:20:06  _____________________________________________________________________  External Attachment:    Type:   Image     Comment:   External Document

## 2010-12-24 NOTE — Assessment & Plan Note (Signed)
Summary: F/U Diverticulitis Flare , saw NP    History of Present Illness Visit Type: Follow-up Visit Primary GI MD: Melvia Heaps MD [facility] Primary Provider: Willow Ora, MD Chief Complaint: Patient here for f/u after diverticulitis flare. She states that she feels much better now and denies any current GI sympytoms. History of Present Illness:   [name] has returned for follow up of her acute diverticulitis.  Symptoms have subsided.  She has had about 5 episodes of acute diverticulitis since 2008.  Last colonoscopy was 2003.  She has a cardiomyopathy related to a Adriamycin  therapy.  Cardiac function has been stable.   GI Review of Systems      Denies abdominal pain, acid reflux, belching, bloating, chest pain, dysphagia with liquids, dysphagia with solids, heartburn, loss of appetite, nausea, vomiting, vomiting blood, weight loss, and  weight gain.      Reports diverticulosis.     Denies anal fissure, black tarry stools, change in bowel habit, constipation, diarrhea, fecal incontinence, heme positive stool, hemorrhoids, irritable bowel syndrome, jaundice, light color stool, liver problems, rectal bleeding, and  rectal pain.    Current Medications (verified): 1)  Zoloft 25 Mg Tabs (Sertraline Hcl) .... Take 1 Tablet By Mouth Once A Day 2)  Lasix 40 Mg Tabs (Furosemide) .... Take 1 Tablet By Mouth Once A Day 3)  Coumadin 5 Mg  Tabs (Warfarin Sodium) .... As Directed 4)  Coreg 12.5 Mg  Tabs (Carvedilol) .... Take One By Mouth Two Times A Day 5)  K-Tabs 10 Meq  Tbcr (Potassium Chloride) .... Take One By Mouth Two Times A Day 6)  Multivitamins   Caps (Multiple Vitamin) .... Take 1 Capsule By Mouth Once A Day 7)  Zolpidem Tartrate 10 Mg Tabs (Zolpidem Tartrate) .Marland Kitchen.. 1 By Mouth At Bedtime 8)  Lisinopril 10 Mg Tabs (Lisinopril) .Marland Kitchen.. 1 By Mouth Once Daily 9)  Flonase 50 Mcg/act Susp (Fluticasone Propionate) .... 2 Sprays Daily On Each Side of The Nose 10)  Hydrocodone-Acetaminophen 5-325  Mg Tabs (Hydrocodone-Acetaminophen) .... Take 1 Tab Every 6 Hours As Needed For Pain 11)  Protonix 40 Mg Tbec (Pantoprazole Sodium) .... One Before Breakfast Everyday 12)  Metronidazole 500 Mg Tabs (Metronidazole) .... Take 1 Tab Two Times A Day X 10 Days 13)  Cipro 500 Mg Tabs (Ciprofloxacin Hcl) .... Take 1 Tab Twice Dailyx 10 Days  Allergies (verified): 1)  Keflex 2)  * ********latex*********  Past History:  Past Medical History: Reviewed history from 02/06/2010 and no changes required. Breast cancer, twice (first on the L breast 1982 w/ chest wall involvment, then a second breast Ca on the R in 1987) Congestive heart failure, due to Adriamycin, s/p ICD Depression Insomnia Headache Diverticulitis, hx of Osteopenia RENAL CALCULUS, HX OF (ICD-V13.01) HEADACHE Menopause since her 30s   Past Surgical History: Reviewed history from 11/27/2009 and no changes required. Mastectomy, B Oophorectomy B,  per a geneticists advise at [facility] Defibrillator-AICD Replaced 5/09  Family History: Reviewed history from 02/06/2010 and no changes required. MI--no DM--no Kidney Ca-- brother Colon Cancer:no Family History of Breast Cancer: Mother, Sister  Social History: Reviewed history from 04/02/2010 and no changes required. Married related to Mr and [name] , they are my patients as well Never Smoked 3 kids Illicit Drug Use - no Patient does not get regular exercise but is very active  ETOH-- socially pre school director   Review of Systems       The patient complains of heart murmur.  The patient denies allergy/sinus, anemia, anxiety-new, arthritis/joint pain, back pain, blood in urine, breast changes/lumps, change in vision, confusion, cough, coughing up blood, depression-new, fainting, fatigue, fever, headaches-new, hearing problems, heart rhythm changes, itching, menstrual pain, muscle pains/cramps, night sweats, nosebleeds, pregnancy symptoms, shortness of breath, skin  rash, sleeping problems, sore throat, swelling of feet/legs, swollen lymph glands, thirst - excessive , urination - excessive , urination changes/pain, urine leakage, vision changes, and voice change.    Vital Signs:  Patient profile:   60 year old female Height:      64 inches Weight:      199.38 pounds BMI:     34.35 BSA:     1.96 Pulse rate:   80 / minute Pulse rhythm:   regular BP sitting:   102 / 70  (left arm)  Vitals Entered By: Lamona Curl CMA Duncan Dull) (June 27, 2010 8:50 AM)   Impression & Recommendations:  Problem # 1:  DIVERTICULITIS-COLON (ICD-562.11) Assessment Improved Plan colonoscopy for further delineation of the extent of her disease.   Coumadin will be held with the consent of cardiology.  I discussed with her consideration of elective surgery because of recurrent diverticulitis.   This will be discussed further following her colonoscopy.  The risks and alternatives for holding anticoagulants or antiplatelet medicines were discussed with the patient. Risks, alternatives, and complications of the procedure, including bleeding, perforation, and possible need for surgery, were explained to the patient.  Patient's questions were answered.  Problem # 2:  CARDIOMYOPATHY, SECONDARY NOS (ICD-425.9) Assessment: Comment Only  Patient Instructions: 1)  Copy sent to : Willow Ora, MD 2)  You will need to call back to schedule your Colonoscopy, You will be scheduled for a Previsit with a nurse at that time to get instructions and sign paperwork. 3)  We will contact Dr Drue Novel and Dr Donnie Aho for OK for you to come off coumadin 4)  The medication list was reviewed and reconciled.  All changed / newly prescribed medications were explained.  A complete medication list was provided to the patient / caregiver.

## 2010-12-24 NOTE — Letter (Signed)
Summary: [facility] Instructions  [facility]  [address] [place], [place] [address]   Phone: [phone]  Fax: [phone]       [name]    1950-12-05    MRN: [id number]        Procedure Day Dorna Bloom:  Farrell Ours  08/30/10     Arrival Time:  3:00pm     Procedure Time:  4:00pm     Location of Procedure:                    _X _  [facility] (4th Floor)                       PREPARATION FOR COLONOSCOPY WITH MOVIPREP   Starting 5 days prior to your procedure 08/25/10 do not eat nuts, seeds, popcorn, corn, beans, peas,  salads, or any raw vegetables.  Do not take any fiber supplements (e.g. Metamucil, Citrucel, and Benefiber).  THE DAY BEFORE YOUR PROCEDURE         DATE: 08/29/10  DAY: THURSDAY  1.  Drink clear liquids the entire day-NO SOLID FOOD  2.  Do not drink anything colored red or purple.  Avoid juices with pulp.  No orange juice.  3.  Drink at least 64 oz. (8 glasses) of fluid/clear liquids during the day to prevent dehydration and help the prep work efficiently.  CLEAR LIQUIDS INCLUDE: Water Jello Ice Popsicles Tea (sugar ok, no milk/cream) Powdered fruit flavored drinks Coffee (sugar ok, no milk/cream) Gatorade Juice: apple, white grape, white cranberry  Lemonade Clear bullion, consomm, broth Carbonated beverages (any kind) Strained chicken noodle soup Hard Candy                             4.  In the morning, mix first dose of MoviPrep solution:    Empty 1 Pouch A and 1 Pouch B into the disposable container    Add lukewarm drinking water to the top line of the container. Mix to dissolve    Refrigerate (mixed solution should be used within 24 hrs)  5.  Begin drinking the prep at 5:00 p.m. The MoviPrep container is divided by 4 marks.   Every 15 minutes drink the solution down to the next mark (approximately 8 oz) until the full liter is complete.   6.  Follow completed prep with 16 oz of clear liquid of your choice  (Nothing red or purple).  Continue to drink clear liquids until bedtime.  7.  Before going to bed, mix second dose of MoviPrep solution:    Empty 1 Pouch A and 1 Pouch B into the disposable container    Add lukewarm drinking water to the top line of the container. Mix to dissolve    Refrigerate  THE DAY OF YOUR PROCEDURE      DATE: 08/30/10  DAY: FRIDAY  Beginning at 11:00am (5 hours before procedure):         1. Every 15 minutes, drink the solution down to the next mark (approx 8 oz) until the full liter is complete.  2. Follow completed prep with 16 oz. of clear liquid of your choice.    3. You may drink clear liquids until 2:00pm (2 HOURS BEFORE PROCEDURE).   MEDICATION INSTRUCTIONS  Unless otherwise instructed, you should take regular prescription medications with a small sip of water   as early as possible the morning of  your procedure.  Diabetic patients - see separate instructions.      Stop taking Coumadin on  08/25/10  Sunday_ _  (5 days before procedure).           OTHER INSTRUCTIONS  You will need a responsible adult at least 62 years of age to accompany you and drive you home.   This person must remain in the waiting room during your procedure.  Wear loose fitting clothing that is easily removed.  Leave jewelry and other valuables at home.  However, you may wish to bring a book to read or  an iPod/MP3 player to listen to music as you wait for your procedure to start.  Remove all body piercing jewelry and leave at home.  Total time from sign-in until discharge is approximately 2-3 hours.  You should go home directly after your procedure and rest.  You can resume normal activities the  day after your procedure.  The day of your procedure you should not:   Drive   Make legal decisions   Operate machinery   Drink alcohol   Return to work  You will receive specific instructions about eating, activities and medications before you leave.    The  above instructions have been reviewed and explained to me by   Rosalita Chessman hodges, RN______________________    I fully understand and can verbalize these instructions _____________________________ Date _________

## 2010-12-24 NOTE — Progress Notes (Signed)
Summary: Move pt to 4pm spot on 10/7   Phone Note Outgoing Call Call back at [facility] Phone [phone]   Call placed by: Merri Ray CMA Duncan Dull),  August 19, 2010 12:57 PM Summary of Call: Called pt to inform her that we are moving her procedure to the 4pm spot on 10/7 due to Dr [facility] ERCP. Waiting on return call.Will go ahead and move pt to the 4pm spot. Initial call taken by: Merri Ray CMA Duncan Dull),  August 19, 2010 12:58 PM  Follow-up for Phone Call        Called pt again. Left another message for pt to arrive here on 08/30/2010 at 3pm for a 4pm procedure. L/M for pt to return call if she had any questions. Follow-up by: Merri Ray CMA Duncan Dull),  August 20, 2010 9:25 AM     Appended Document: Move pt to 4pm spot on 10/7 Spoke with pt she is aware of time change

## 2010-12-24 NOTE — Progress Notes (Signed)
Summary: due for INR and  Phone Note Outgoing Call   Summary of Call: overdue for INR. Please arrange ASAP Jose E. Paz MD  June 11, 2010 2:51 PM   Follow-up for Phone Call        I left message for pt to call back. Army Fossa CMA  June 11, 2010 3:03 PM   Additional Follow-up for Phone Call Additional follow up Details #1::        pt called back and is going to come in Monday 7/25 to  have an INR with Dr. Beverely Low since Dr. Drue Novel will be out of the office.Lavell Islam  June 12, 2010 8:42 AM

## 2010-12-26 NOTE — Progress Notes (Signed)
Summary: zolpidem refill  Phone Note Refill Request Message from:  Fax from Pharmacy on November 04, 2010 11:05 AM  Refills Requested: Medication #1:  ZOLPIDEM TARTRATE 10 MG TABS 1 by mouth at bedtime   Last Refilled: 08/06/2010 [id number], [address] [place], [place], [place]  phone-[phone], fax - [phone]   qty -90  Next Appointment Scheduled: none Initial call taken by: Jerolyn Shin,  November 04, 2010 11:07 AM  Follow-up for Phone Call        #90 and 1 RF Follow-up by: [facility] E. Paz MD,  November 04, 2010 1:07 PM    Prescriptions: ZOLPIDEM TARTRATE 10 MG TABS (ZOLPIDEM TARTRATE) 1 by mouth at bedtime  #90 x 1   Entered by:   Army Fossa CMA   Authorized by:   Nolon Rod. Paz MD   Signed by:   Army Fossa CMA on 11/04/2010   Method used:   Printed then faxed to ...       [facility]  [address] [phone]* (retail)       [address]       [place], [place]  [address]       Ph: [phone]       Fax: [phone]   RxID:   [id number]

## 2010-12-26 NOTE — Assessment & Plan Note (Signed)
Summary: cold and cough, no fever///sph   Vital Signs:  Patient profile:   60 year old female Weight:      195.50 pounds O2 Sat:      95 % on Room air Temp:     98.1 degrees F oral Pulse rate:   84 / minute Pulse rhythm:   regular BP sitting:   130 / 82  (left arm) Cuff size:   regular  Vitals Entered By: Army Fossa CMA (October 31, 2010 4:08 PM)  O2 Flow:  Room air CC: Pt here chest congestion, sinus pressure Comments x 2-3 days [facility]    History of Present Illness: 3 days  history of a cold nose  and chest congestion chest tightness, anteriorly, worse with cough Coughing up green sputum   Current Medications (verified): 1)  Zoloft 25 Mg Tabs (Sertraline Hcl) .... Take 1 Tablet By Mouth Once A Day 2)  Lasix 40 Mg Tabs (Furosemide) .... Take 1 Tablet By Mouth Once A Day 3)  Coumadin 5 Mg  Tabs (Warfarin Sodium) .... As Directed 4)  Coreg 12.5 Mg  Tabs (Carvedilol) .... Take One By Mouth Two Times A Day 5)  K-Tabs 10 Meq  Tbcr (Potassium Chloride) .... Take One By Mouth Two Times A Day 6)  Multivitamins   Caps (Multiple Vitamin) .... Take 1 Capsule By Mouth Once A Day 7)  Zolpidem Tartrate 10 Mg Tabs (Zolpidem Tartrate) .Marland Kitchen.. 1 By Mouth At Bedtime 8)  Lisinopril 10 Mg Tabs (Lisinopril) .Marland Kitchen.. 1 By Mouth Once Daily 9)  Flonase 50 Mcg/act Susp (Fluticasone Propionate) .... 2 Sprays Daily On Each Side of The Nose 10)  Hydrocodone-Acetaminophen 5-325 Mg Tabs (Hydrocodone-Acetaminophen) .... Take 1 Tab Every 6 Hours As Needed For Pain 11)  Protonix 40 Mg Tbec (Pantoprazole Sodium) .... One Before Breakfast Everyday 12)  Jantoven 2 Mg Tabs (Warfarin Sodium) .... As Directed. Needs Appt.  Allergies (verified): 1)  Keflex 2)  * ********latex*********  Past History:  Past Medical History: Reviewed history from 08/21/2010 and no changes required. Breast cancer, twice (first on the L breast 1982 w/ chest wall involvment, then a second breast Ca on the R in  1987) Congestive heart failure, due to Adriamycin, s/p ICD-CARDIOMYOPATHY Depression Insomnia Headache Diverticulitis, hx of Osteopenia RENAL CALCULUS, HX OF (ICD-V13.01) HEADACHES Menopause since her 30s   Past Surgical History: Reviewed history from 11/27/2009 and no changes required. Mastectomy, B Oophorectomy B,  per a geneticists advise at [facility] Defibrillator-AICD Replaced 5/09  Review of Systems General:  Denies fever. CV:  Denies swelling of feet; occasionally palpitations, not a new thing. Resp:  ?some wheezing  SOB slightly  > than baseline . GI:  Denies nausea and vomiting. MS:  Denies muscle aches.  Physical Exam  General:  alert and well-developed.  nontoxic Head:  face symmetric Ears:  R ear normal and L ear normal.   Nose:  slightly congested Mouth:  no redness or discharge Lungs:  normal respiratory effort, no intercostal retractions, and no accessory muscle use.  few rhonchi with cough. No increased work of breathing Heart:  normal rate and regular rhythm.  + syst murmur    Impression & Recommendations:  Problem # 1:  URI (ICD-465.9) was seen with similar symptoms a month ago, prescribed a Z-Pak. She felt much better except for a dry lingering cough until 3 days ago when although the symptoms are started. No history of asthma No GERD symptoms Plan: Treat conservatively, was provided with  albuterol last month. Will continue using it as needed see #2  Her updated medication list for this problem includes:    Hydrocodone-homatropine 5-1.5 Mg/46ml Syrp (Hydrocodone-homatropine) ..... One or 2 teaspoons every 8 hours as needed for cough  Problem # 2:  COUGH (ICD-786.2) currently has a obvious  infection. If the cough keep lingering, I asked the  patient to call me. d/c ACEi?  Complete Medication List: 1)  Zoloft 25 Mg Tabs (Sertraline hcl) .... Take 1 tablet by mouth once a day 2)  Lasix 40 Mg Tabs (Furosemide) .... Take 1 tablet by mouth once a  day 3)  Coumadin 5 Mg Tabs (Warfarin sodium) .... As directed 4)  Coreg 12.5 Mg Tabs (Carvedilol) .... Take one by mouth two times a day 5)  K-tabs 10 Meq Tbcr (Potassium chloride) .... Take one by mouth two times a day 6)  Multivitamins Caps (Multiple vitamin) .... Take 1 capsule by mouth once a day 7)  Zolpidem Tartrate 10 Mg Tabs (Zolpidem tartrate) .Marland Kitchen.. 1 by mouth at bedtime 8)  Lisinopril 10 Mg Tabs (Lisinopril) .Marland Kitchen.. 1 by mouth once daily 9)  Flonase 50 Mcg/act Susp (Fluticasone propionate) .... 2 sprays daily on each side of the nose 10)  Protonix 40 Mg Tbec (Pantoprazole sodium) .... One before breakfast everyday 11)  Jantoven 2 Mg Tabs (Warfarin sodium) .... As directed. needs appt. 12)  Hydrocodone-homatropine 5-1.5 Mg/27ml Syrp (Hydrocodone-homatropine) .... One or 2 teaspoons every 8 hours as needed for cough  Patient Instructions: 1)  rest, fluids, Tylenol 2)  Mucinex DM as needed for cough 3)  Hydrocodone it cough severe 4)  albuterol if wheezing 5)  call in few days if no better 6)  call if the cough continued lingering after you've the better Prescriptions: HYDROCODONE-HOMATROPINE 5-1.5 MG/5ML SYRP (HYDROCODONE-HOMATROPINE) one or 2 teaspoons every 8 hours as needed for cough  #150cc x 0   Entered and Authorized by:   Elita Quick E. Paz MD   Signed by:   Nolon Rod. Paz MD on 10/31/2010   Method used:   Print then Give to Patient   RxID:   [id number]    Orders Added: 1)  Est. Patient Level III [16606]

## 2010-12-31 ENCOUNTER — Encounter: Payer: Self-pay | Admitting: Internal Medicine

## 2010-12-31 ENCOUNTER — Ambulatory Visit (INDEPENDENT_AMBULATORY_CARE_PROVIDER_SITE_OTHER): Payer: BC Managed Care – PPO

## 2010-12-31 DIAGNOSIS — Z5181 Encounter for therapeutic drug level monitoring: Secondary | ICD-10-CM

## 2010-12-31 DIAGNOSIS — I509 Heart failure, unspecified: Secondary | ICD-10-CM

## 2010-12-31 DIAGNOSIS — Z7901 Long term (current) use of anticoagulants: Secondary | ICD-10-CM

## 2010-12-31 LAB — CONVERTED CEMR LAB: INR: 3.9

## 2011-03-03 ENCOUNTER — Other Ambulatory Visit: Payer: Self-pay | Admitting: Internal Medicine

## 2011-03-04 ENCOUNTER — Ambulatory Visit (INDEPENDENT_AMBULATORY_CARE_PROVIDER_SITE_OTHER): Payer: BC Managed Care – PPO | Admitting: *Deleted

## 2011-03-04 DIAGNOSIS — I429 Cardiomyopathy, unspecified: Secondary | ICD-10-CM

## 2011-03-04 DIAGNOSIS — Z7901 Long term (current) use of anticoagulants: Secondary | ICD-10-CM

## 2011-03-04 NOTE — Patient Instructions (Signed)
Pt advise no change recheck 4 weeks.

## 2011-04-01 ENCOUNTER — Other Ambulatory Visit: Payer: Self-pay | Admitting: Internal Medicine

## 2011-04-08 NOTE — Op Note (Signed)
NAMELAVRA, IMLER            ACCOUNT NO.:  [id number]   MEDICAL RECORD NO.:  [id number]          PATIENT TYPE:  INP   LOCATION:  2853                         FACILITY:  [facility]   PHYSICIAN:  Duke Salvia, MD, FACCDATE OF BIRTH:  Sep 04, 1951   DATE OF PROCEDURE:  DATE OF DISCHARGE:  03/24/2008                               OPERATIVE REPORT   PREOPERATIVE DIAGNOSIS:  Previously implanted CRT - ICD with left  ventricular lead failure.   POSTOPERATIVE DIAGNOSIS:  Previously implanted CRT - ICD with left  ventricular lead failure.   PROCEDURE:  Explantation of a previous device pocket revision,  implantation of a new device and capping of the LV lead.   Following obtaining informed consent, the patient was brought to the  electrophysiology laboratory and placed on the fluoroscopic table in  supine position.  After routine prep and drape of the left upper chest,  lidocaine was infiltrated along line of previous incision and carried  down to the layer of the prepectoral fascia using electrocautery and  sharp dissection.  The pocket was opened.  The device was explanted.   Interrogation of the previously implanted leads demonstrated stable  function of the RA and RV lead recorded subsequently, and the LV lead  had an impedance of greater than 4000.  It had no capture in the bipolar  mode and had 6 V capture in the unipolar mode.   The left ventricular lead was capped.  The pocket was expanded caudally  to allow for housing of the larger footprint defibrillator.   The leads were then attached to a Guidant Vitality 2 T165 ICD, serial  number [id number].  Through the device, bipolar R wave was 19.1 with a  pacing impedance of 500 ohms and threshold 0.8 V of 0.5 msec.  The P  wave was 2.5 with impedance of 524, threshold was 0.8 V at 0.5 msec.  High-voltage impedance was 42 ohms.   With these acceptable parameters recorded, defibrillation threshold  testing was undertaken.  Ventricular  fibrillation was induced via the T-  wave shock.  After a total duration of 6 seconds, 17 joules shock was  delivered through a measured resistance of 41 ohms terminating  ventricular fibrillation and restoring sinus rhythm.  The device was  implanted.  Surgicel was placed at the caudal end of the pocket after  the pocket was copiously irrigated with antibiotic containing saline  solution.  Hemostasis was assured prior to the use of Surgicel.  The  device and leads were placed in the pocket and the wound was closed in  three layers in normal fashion.  Wound was washed and  dried and a benzoin Steri-Strip dressing was applied.  Needle counts,  sponge counts, and instrument counts were correct at the end of  procedure according to staff.  The patient tolerated the procedure  without apparent complications.      Duke Salvia, MD, [facility]  Electronically Signed     SCK/MEDQ  D:  03/24/2008  T:  03/25/2008  Job:  [id number]   cc:   Georga Hacking, M.D.  Electrophysiology Laboratory  [facility]

## 2011-04-08 NOTE — Letter (Signed)
August 01, 2008    W. Viann Fish, MD  [phone]  [place], [place] [address]   RE:  Alejandra, Kirby  MRN:  [id number]  /  DOB:  10-May-1951   Dear Karleen Hampshire,   [name] comes in followup for her ICD implant as part of the  maintenance CRT trial, which you know was recently presented and was  positive for 30% reduction in heart failure events.  She was, as you  recall, randomized to the LV lead.  Her LV lead is nonfunctional.  As  she had been largely asymptomatic, we have chosen not to revise that.   She describes recent spells, however, that are concerning.  She has a  sensation of palpitations followed by lightheadedness.  These episodes  are quite brief.  A Holter monitor was unrevealing.  She has these  spells a couple of times a week.   Medications include Lasix, Zestril, Coreg 12.5 b.i.d., potassium,  Coumadin, Zoloft, Lunesta, and Celebrex.   On examination, her blood pressure was 103/62 with a pulse of 70.  The  lungs were clear.  Heart sounds were regular.  The extremities were  without edema.   Interrogation of her Medtronic vitality pulse generator demonstrated an  R-wave of 25 with impedance of 606, a threshold of 1 V at 0.4, the P-  wave was 3 with impedance of 425 with a threshold of 1 V at 0.4.  High-  voltage impedance of 48 ohms.   There were no intercurrent therapies.   Her device was reprogrammed to create a monitoring zone from 140-180 to  see if we could identify an arrhythmic correlation with her symptoms.   IMPRESSION:  1. Nonischemic cardiomyopathy.  2. Recent negative catheterization for chest pain.  3. Near normal mixed venous O2.  4. Episodes of dizziness associated with palpitations.   Karleen Hampshire, [facility] defibrillator has not identified any abnormal  events.  I have reprogrammed as mentioned above to see if we can clarify  what these other spells are.    Sincerely,      Duke Salvia, MD, [facility]  Electronically Signed    SCK/MedQ  DD: 08/01/2008  DT: 08/01/2008  Job #: [id number]

## 2011-04-08 NOTE — Assessment & Plan Note (Signed)
[facility]                         GASTROENTEROLOGY OFFICE NOTE   MISTEY, HOFFERT                   MRN:          [id number]  DATE:11/01/2007                            DOB:          07-21-1951    PROBLEM:  Lower abdominal pain.   HISTORY:  This is a pleasant, 60 year old, white female known to Dr.  Arlyce Dice who has prior history of diverticulitis and has been treated on  several occasions.  Her last episode was in March 2008, and she  responded to PO Cipro and Flagyl at that time.  Her last colonoscopy was  done in 2003.  She was noted to have left colon diverticulosis.  The  patient has a history of breast cancer and has an ischemic  cardiomyopathy felt secondary to chemotherapy with Adriamycin.  She is  maintained on Coumadin and also has an AICD in place.   The patient says her current pain started on Friday, December 5, in the  left lower abdomen.  Initially, it was not severe, but has progressed  over the weekend.  She is now having a constant dull pain radiating  across her lower abdomen.  She says she is more uncomfortable with  movement, walking, etc.  She did have fevers at home over the weekend as  high as 101.  She has not had any fever today.  No chills or shaking  chills.  She has had some mild nausea, no vomiting and has been able to  keep down p.o.  Her bowel movements have been normal.  She has not  noted any melena or hematochezia.   CURRENT MEDICATIONS:  1. Lasix 40 mg p.o. daily.  2. Zestril 10 mg daily.  3. KCl 10 mEq b.i.d.  4. Coumadin as directed.  5. Zoloft 25 mg daily.  6. Coreg 12.5 mg b.i.d.   ALLERGIES:  No known drug allergies.   PHYSICAL EXAMINATION:  GENERAL:  Well-developed, white female in no  acute distress.  VITAL SIGNS:  Weight is 209.  Blood pressure 122/66, pulse 88,  temperature 98.4.  CARDIAC:  Regular rate and rhythm with S1, S2.  A 4/6 systolic murmur.  LUNGS:  Clear to A&P.  ABDOMEN:   Soft.  Bowel sounds are active.  There is no palpable mass or  hepatosplenomegaly.  She is tender in the left lower quadrant and  suprapubic area.  No guarding or rebound.  RECTAL:  Not done today.   IMPRESSION:  83. [name] with recurrent diverticulitis.  2. History of ischemic cardiomyopathy on chronic Coumadin.   PLAN:  1. Start Flagyl 500 mg b.i.d. x10 days.  2. Cipro 500 mg b.i.d. x10 days.  3. Vicodin 5/500 one every 4-6 hours as needed for pain, #20 and no      refills.  4. The patient was advised to have her pro time/INR checked in      approximately in 7-10 days.  5. The patient was also advised should her symptoms worsen, that she      would require further workup with CT scan, etc.,and not to hesitate      to  call us.      Mike Gip, PA-C  Electronically Signed      Venita Lick. Russella Dar, MD, [facility]  Electronically Signed   AE/MedQ  DD: 11/01/2007  DT: 11/02/2007  Job #: [id number]   cc:   Barbette Hair. Arlyce Dice, MD,FACG

## 2011-04-08 NOTE — Cardiovascular Report (Signed)
NAMEKEILA, [name]            ACCOUNT NO.:  [id number]   MEDICAL RECORD NO.:  [id number]          PATIENT TYPE:  INP   LOCATION:  4743                         FACILITY:  [facility]   PHYSICIAN:  Georga Hacking, M.D.DATE OF BIRTH:  1951-01-21   DATE OF PROCEDURE:  05/25/2008  DATE OF DISCHARGE:  05/25/2008                            CARDIAC CATHETERIZATION   HISTORY:  This is a 60 year old female who has a history of idiopathic  cardiomyopathy felt due to late sequelae following chemotherapy.  She  had unremarkable catheterization 11 years ago.  She had a  cardiopulmonary stress test that showed an abnormal blood pressure  response to exercise previously.  She presented with chest pressure that  occurred over a couple of days and was brought to have unstable angina.   PROCEDURE:  Left heart catheterization with coronary angiograms and left  ventriculogram.   COMMENTS ABOUT PROCEDURE:  The patient had previously been given fresh  frozen plasma and vitamin K and her INR was 1.7.  The right femoral  artery was entered using a single anterior needle wall stick and a 6-  French sheath was placed.  The coronary arteries were incised using 6-  French catheters and a 30 mL, ventriculogram was performed.  She was  returned to the holding area for sheath removal following the procedure.   HEMODYNAMIC DATA:  Aorta postcontrast 113/63, LV postcontrast 113/12-27.   ANGIOGRAPHIC DATA:  Left ventriculogram:  Performed in the 30-degree RAO  projection.  The aortic valve was normal.  The mitral valve was normal.  The left ventricle was dilated with severe global hypokinesis noted.  Estimated ejection fraction was 20-25%.  Coronary arteries:  Arise and distribute normally.  This system is  codominant.  There is no significant coronary calcification noted.  The  left main coronary artery is normal.  The left anterior descending  extends the apex and has a diagonal branch.  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  The  circumflex coronary artery is codominant and contains no significant  disease.  The right coronary artery is codominant and contains mild  irregularities.   IMPRESSION:  1. Dilated left ventricle with severe global hypokinesis.  Estimated      ejection fraction of 20-25%.  2. No significant obstructive coronary artery disease.  3. 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      Georga Hacking, M.D.  Electronically Signed     WST/MEDQ  D:  05/25/2008  T:  05/25/2008  Job:  [id number]   cc:   Duke Salvia, MD, [facility]

## 2011-04-08 NOTE — Assessment & Plan Note (Signed)
[facility]                                 ON-CALL NOTE   [name], [name]                     MRN:          [id number]  DATE:08/12/2008                            DOB:          Apr 09, 1951    REGULAR DOCTOR:  Dr. Drue Novel.   On August 12, 2008, call was at 5:32 p.m.   PHONE NUMBER:  [phone].   CHIEF COMPLAINT:  Blood in urine.   The patient said that she is a Coumadin patient.  At 3:30 this  afternoon, she started having frequency of urination with some burning.  She noticed some bright red blood in her urine this afternoon.  It is  not a lot but just a pink tint.  She is a little concerned about this  and is having some low back pain as well.  She denies nausea, vomiting,  diarrhea, fever or other symptoms.  I advised her that this may be a  sign that she is getting a urinary tract infection and advised her to go  to urgent care for evaluation and treatment and that is what she is  going to do.     Marne A. Tower, MD  Electronically Signed    MAT/MedQ  DD: 08/12/2008  DT: 08/13/2008  Job #: [id number]

## 2011-04-08 NOTE — Letter (Signed)
July 22, 2007    W. Viann Fish, M.D.  [address], [address]  [place], [place] [address]   RE:  [name], [name]  MRN:  [id number]  /  DOB:  1951-11-15   Dear Karleen Hampshire:   It was a pleasure to see [name] today.  As you know she has an  ICD implanted per the CRT protocol and her LV lead threshold has made it  non functional.  CPX testing undertaken demonstrated a near normal max  VO2 for her age; more notable for hypotensive response to exercise which  raised the issues you recall of ischemia.  She continues to have class  II to class III symptoms.   MEDICATIONS:  Her medications are notable for Coreg at 12.5 b.i.d.,  Zoloft, Zestril 10, Lasix, potassium and Coumadin.   EXAMINATION:  VITAL SIGNS:  Her blood pressure today was 106/67 with a  pulse of 68.  LUNGS:  Were clear.  CARDIAC:  Heart sounds were regular with a honking systolic murmur at  the left upper sternal border.  EXTREMITIES:  No edema.   INTERROGATION OF HER GUIDANT RENEWAL CRT DEVICE:  Demonstrates that her  left ventricular lead is turned off, her R wave was 17.5 with impedance  of 549 and threshold of 0.8, 0.4 in both the RA and RV, the RA amplitude  was 2.4 with impedance of 536.   IMPRESSION:  1. Non ischemic cardiomyopathy.  2. Class II to class III congestive - systolic, - chronic symptoms.  3. Left bundle branch block with 0% ventricular pacing.  4. Inappropriate blood pressure response to activity.  5. Reasonable VO2 max.   [name], [name], is doing alright from a defibrillator point of  view.  I did ask her to ask you whether you thought there might be any  benefit of further up titrating her Coreg.   We will be glad to see her again as part of the care team protocol  followup.    Sincerely,      Duke Salvia, MD, [facility]  Electronically Signed    SCK/MedQ  DD: 07/22/2007  DT: 07/22/2007  Job #: [id number]

## 2011-04-08 NOTE — Discharge Summary (Signed)
[name], [name] NO.:  [id number]   MEDICAL RECORD NO.:  [id number]          PATIENT TYPE:  INP   LOCATION:  2853                         FACILITY:  [facility]   PHYSICIAN:  Duke Salvia, MD, FACCDATE OF BIRTH:  [birth year]   DATE OF ADMISSION:  03/24/2008  DATE OF DISCHARGE:                               DISCHARGE SUMMARY   She has an allergy to LATEX.   Dictation and exam greater than 40 minutes.   FINAL DIAGNOSES:  1. Biventricular internal cardioverter-defibrillator at elective      replacement indicator.  2. Explant of existing Guidant Contak Renewal device and implant of      Guidant Vitality 2 cardioverter-defibrillator with cardiac      resynchronization lead with high impendence greater than 4000 ohms.  3. Soft tissue abnormality in the device pocket; sample sent for      culture.  Results to Dr. Donnie Aho and Dr. Graciela Husbands from the microscopy      laboratory at [facility].   SECONDARY DIAGNOSES:  1. Nonischemic cardiomyopathy.  2. New York Heart Association class II congestive heart failure.  3. Left bundle-branch block.  4. Implantation of a cardiac resynchronization therapy-defibrillator      system May 24, 2004.   PROCEDURE:  Mar 24, 2008:  Explant of existing cardioverter-defibrillator  with implant of the Guidant Vitality device, Dr. Sherryl Manges.  Defibrillator threshold study less than or equal to 17 joules.  The left  ventricular lead had impedance greater than 4000 ohms.  Looking at the  procedure note, I do not believe that the left ventricular lead was  reactivated.   BRIEF HISTORY:  [name] is a 60 year old female.  She has a  history of nonischemic cardiomyopathy and she had a cardioverter-  defibrillator with left ventricular lead implanted May 24, 2004.  This  device has reached elective replacement indicator.  There is a high  threshold and high left ventricular impedance.  The question is whether  it was lead  fracture or a set screw issue.  The device will have to be  replaced.  The following is a quote:  In the event that we identify an  implant issue related to her LV lead I would be inclined to put in a CRT  system and hook up the LV lead.  In the event that the LV connections  are normal I would probably cap the lead.   Note:  Mention of tissue culture of infection.  This culture was taken  from the pocket of the cardioverter-defibrillator and submitted for  culture May 1.  The microbiology office has been requested to forward  results of the culture to Dr. York Spaniel office.   INSTRUCTIONS AT DISCHARGE:  The patient is to take off the bandage the  morning of Saturday, May 2, leave the incision open to the air.  She is  to keep the incision dry over the next 7 days, sponge bathe until  Friday, May 8.   MEDICATIONS AT DISCHARGE:  1. Lasix 40 mg daily.  2. Zestril 10 mg daily.  3. Potassium chloride 10 mEq  twice daily.  4. Zoloft 25 mg daily.  5. Coreg 12.5 mg twice daily.  6. She is to resume Coumadin as before this admission.  7. She is to add Keflex 500 mg one tablet one-half hour before      breakfast, lunch, dinner, bedtime.  This is new, it is an      antibiotic and 8-day therapy.   She follows up with [facility] ICD clinic Monday, May 18 at 9:20  a.m.  She sees Dr. Donnie Aho Monday, May 11 at 3 p.m.   LABORATORY STUDIES PRIOR TO THIS ADMISSION:  White cells are 6.1,  hemoglobin 13.3, hematocrit 39.1, and platelets are 226.  The pro time  is 28.3, INR 2.7.  Sodium 142, potassium 4.3, chloride 110, carbonate  30, glucose 81, BUN is 10 and the creatinine is 0.9.      [name], [place]      Duke Salvia, MD, [facility]  Electronically Signed    GM/MEDQ  D:  03/24/2008  T:  03/24/2008  Job:  [id number]   cc:   Georga Hacking, M.D.  Willow Ora, MD

## 2011-04-08 NOTE — Discharge Summary (Signed)
NAMEBANEZA, [name]            ACCOUNT NO.:  [id number]   MEDICAL RECORD NO.:  [id number]          PATIENT TYPE:  INP   LOCATION:  4743                         FACILITY:  [facility]   PHYSICIAN:  Georga Hacking, M.D.DATE OF BIRTH:  [birth year]   DATE OF ADMISSION:  05/24/2008  DATE OF DISCHARGE:  05/25/2008                               DISCHARGE SUMMARY   FINAL DIAGNOSES:  1. Chest pain probably noncardiac.      a.     Catheterization showing no significant coronary artery       disease.      b.     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  2. Cardiomyopathy idiopathic.  3. Coumadin, anticoagulation reverse.  4. Implantable defibrillator.  5. Mild aortic thickening with no gradient across the valve.  6. History of breast cancer.   PROCEDURES:  Left heart catheterization and echocardiogram.   HISTORY:  [name] with history of cardiomyopathy and has  had a previous implantable defibrillator with recent generator changed  in May.  She previously was part of a study involving biventricular  leads but her LV lead was nonfunctional and kept off with the most  recent change.  She previously had exertional dyspnea and headache.  Cardiopulmonary fitness test on [facility] previously did show a  drop in blood pressure with exercise.  She wanted to defer  catheterization that they had already done and had been doing fine.  Yesterday, she had the onset of midsternal pressure feeling as if there  was heaviness or someone standing on her chest and lasted for 30  minutes.  Other episode occurred after dinner, lasting 45 minutes after  which she has laid down.  She had pressure at this morning and had left  bundle-branch block on EKG and was admitted to rule out myocardial  infarction and unstable angina.  Please see the previously dictated  history and physical for remainder of the details.   HOSPITAL COURSE:  She was admitted with an  INR of 3.7.  CBC and  chemistry panel were normal.  Chest x-ray showed cardiomegaly.  EKG  showed left bundle-branch block.  Her Coumadin was reversed with 5 mg of  vitamin K as well as 2 units of fresh frozen plasma and INR was 1.7 in  the next morning.  She underwent left heart catheterization, which had  the findings of normal coronary arteries with no significant coronary  disease.  There is a left anterior descending to pulmonary artery  fistula present, which was previously noted at catheterization in 1998.  Echocardiogram showed mild-to-moderate aortic regurgitation and mild  mitral regurgitation.  She tolerated the catheterization well and we  send her home later on that evening after checking the catheterization  site, finding everything to be fine.  She is discharged at this time in  improved condition on warfarin 2 mg to resume dose tonight, Lunesta 3 mg  at night p.r.n., and Zoloft 25 mg nightly, warfarin 5 mg to take as  directed, carvedilol 12.5 mg b.i.d.,  lisinopril 10 mg, K-Dur 10  mEq b.i.d., frusemide 40 mg daily, and  calcium/vitamin D 600/200 mg daily.  She has to resume Coumadin tonight  and has to follow up in the office in 2 weeks for protime.  She was  given care instructions for catheterization site.      Georga Hacking, M.D.  Electronically Signed     WST/MEDQ  D:  05/25/2008  T:  05/26/2008  Job:  [id number]   cc:   Duke Salvia, MD, [facility]

## 2011-04-08 NOTE — Letter (Signed)
March 06, 2008    W. Viann Fish, M.D.  [address]  [place], [place]. [address]   RE:  [name], Kirby  MRN:  [id number]  /  DOB:  1951-08-28   Dear [name] Kirby:   I hope you and your family had a wonderful Easter.   [name] Kirby came in today for follow-up __________  CRT and was  found to have reached ERI with a charge time of 18.4 seconds.  She has  no new complaints.  Her shortness of breath remains stable.  Her fatigue  is also stable.  Recalling that she had had a CPX demonstrating near  normal MVO2s at device generator replacement, the question is begged as  to what to do with her LV lead.  Further recalling that it was  associated with a high threshold and high LV impedance, the question was  whether it was a lead fracture or a set screw issue.   Her medications currently include:  1. Coreg 12.5 b.i.d.  2. Zoloft 25.  3. Coumadin.  4. Potassium.  5. Lasix.  6. Zestril.   On examination, the blood pressure was 119/72 with a pulse of 79.  The lungs were clear.  The heart sounds were regular, but there is a loud musical murmur with a  2 to 3 systolic component and then an early systolic component and then  a loud quite musical late systolic component.  I do not recall having  heard it this way before.  The abdomen was soft.  The extremities were without edema.   Interrogation of her Guidant H135 device demonstrates a P-wave of 2.5  with impedance of 524, threshold of 0.6 at 0.4.  The R-wave was 25 with  impedance of 592, a threshold 0.8 at 0.5.  Battery voltage was 2.59.  ERI was reached March 01, 2008.   IMPRESSION:  1. Nonischemic cardiomyopathy thought to be potentially related to      chemotherapy.  2. Class 2 to 3 symptoms.  3. Near normal mixed venous O2.  4. Status post CRTD implantation as part of MADIT CRT with LV lead      inactivation because of high threshold/high impedance.  Device now      ERI.  5. Musical murmur.   [name] Kirby, [name] Kirby, has reached ERI and will need device generator  replacement.  She would like to look at her schedule, and we have  discussed the potential benefits as well as potential risks including  primarily related to infection.  In the event that we identify an  implant issue related to her LV lead, I would be inclined to put in a  CRT system and hookup the LV lead, in the event that the LV connections  are normal, I would probably capped the lead.   As relates to her murmur, though, I am not quite sure whether this is  new or old and wanted to talk to you about that and whether anything  further needs to be done to evaluate that prior to, though unrelated to  proceeded.   [name] Kirby, thanks very much for allowing [place] to participate in her care.    Sincerely,      Duke Salvia, MD, [facility]  Electronically Signed    SCK/MedQ  DD: 03/06/2008  DT: 03/06/2008  Job #: [id number]

## 2011-04-10 ENCOUNTER — Telehealth: Payer: Self-pay | Admitting: Internal Medicine

## 2011-04-10 NOTE — Telephone Encounter (Signed)
Please advice patient, she is due for an INR

## 2011-04-10 NOTE — Telephone Encounter (Signed)
Message left for patient to return my call.  

## 2011-04-11 NOTE — Telephone Encounter (Signed)
Message left for patient to return my call.  

## 2011-04-11 NOTE — Op Note (Signed)
NAMESAYWARD, HORVATH                      ACCOUNT NO.:  [id number]   MEDICAL RECORD NO.:  [id number]                   PATIENT TYPE:  OIB   LOCATION:  2899                                 FACILITY:  [facility]   PHYSICIAN:  Duke Salvia, M.D.               DATE OF BIRTH:  [birth year]   DATE OF PROCEDURE:  DATE OF DISCHARGE:                                 OPERATIVE REPORT   DATE OF PROCEDURE:  May 24, 2004.   PREOPERATIVE DIAGNOSES:  1. Nonischemic cardiomyopathy.  2. Class 2 congestive failure.  3. Broad QRS.   POSTOPERATIVE DIAGNOSES:  1. Nonischemic cardiomyopathy.  2. Class 2 congestive failure.  3. Broad QRS.   PROCEDURE:  Implantation of a CRT defibrillation as part of the Madit-CRT  study.   Following obtaining informed consent, the patient was brought to the  electrophysiology laboratory and placed on the fluoroscopic table in the  supine position.  After routine prep and drape of the left upper chest,  lidocaine was infiltrated in the prepectoral subclavicular region and an  incision was made and carried down to the level of the prepectoral fascia  using electrocautery and sharp dissection.  A pocket was formed similarly.  Hemostasis was obtained.   Thereafter, attention was turned to gaining access to the extrathoracic left  subclavian vein which was accomplished without difficulty without the  aspiration of air or puncture of the artery.  Three separate venipunctures  were accomplished, guidewires were placed and retained, and around this  cephalad to a #0 silk suture was placed in a figure-of-eight fashion and  allowed to hang loosely.   Subsequently over the middle wire, a 9-French tearaway introducer sheath was  placed through which was passed a Guidant 0.158 dual coil active fixation  defibrillator lead, serial [id number].  Under fluoroscopic guidance, it was  manipulated to the right ventricular apex with bipolar R-wave was greater  than 10 mV with a pacing  impedance of 666 ohms and a pacing threshold of 0.5  V at 0.9 msec.  There was no diaphragmatic pacing at 10 V.   This lead was secured to the prepectoral fascia and over the most caudal  wire.  An 8-French sheath was placed in conjunction with a CS wide coronary  sinus cannulation sheath, and the coronary cannulation sheath placed over a  wooly wire.  The coronary sinus was cannulated without significant  difficulty and 2 venograms in the AP and LAO projections were obtained  demonstrating the small branch on the lateral wall and 2 larger branches  emerging off the anterolateral wall that traversed down the lateral aspect  of the left ventricle.  Using a Whisper EDS wire and an EASYTRAC-2, serial  #[id number], bipolar left ventricle lead, serial [id number], these 2 more lateral and  more superior veins were successively cannulated.  It was elected to take  the more caudal of these as it traversed more laterally.  In  this location,  the bipolar L-wave was greater than 30 mV with a pacing impedance of 1776  ohms and a threshold of 1 V at 0.5 msec.  Again, there was no diaphragmatic  pacing at 10 V.   The 8-French sheath was removed, the CS sheath was left in place, and over  the last guidewire, a 7-French tearaway introducer sheath was  placed  through which was then placed a Medtronic #5076, 52 cm active fixation  atrial lead, serial #[id number].  Under fluoroscopic guidance, it was  manipulated to the right atrial appendage where the bipolar P-wave was 4 mV  with a pacing impedance of 892 ohms and a pacing threshold immediately after  screw deployment of 1.8 V at 0.5 msec.   This lead was secured to the prepectoral fascia, and then under fluoroscopic  guidance, the coronary sinus deployment system was removed, leaving the  EASYTRAC-2 lead in place.  The leads were then attached to a Guidant contact  renewal H135 ICD, serial [id number].  Through the device, the bipolar P-wave  was 4.8 mV with a  pacing impedance of 761 ohms and a pacing threshold of 1 V  at 0.5 msec.  The R-wave was 19.8 with an impedance of 592 ohms and a  threshold of 0.6 V, and the L-wave was 22.3 mV with a pacing impedance of  1479 ohms and a threshold of 1.2 V at 0.5 msec.  Shock impedance was 13  ohms.   With these acceptable parameters recorded, defibrillation threshold testing  was undertaken.  Ventricular defibrillation was induced via the T-wave  shock.  After a duration of 8 seconds, 14-joule shock was delivered to a  measured resistance of 40 ohms, terminating ventricular fibrillation and  restoring a sinus rhythm.  The patient had significant hypotension  afterwards that persisted for some minutes and it was elected to not  undertake further defibrillation tests.   The pocket was copiously irrigated with antibiotic containing saline  solution.  Hemostasis was obtained.  The leads in the pulse generator were  secured to the prepectoral fascia and the wound was then closed in 3 layers  in the normal fashion.  The wound was washed, dried, and a Benzoin and Steri-  Strip dressing was applied.  Needle counts, sponge counts, and instrument  counts were correct at the end of the procedure, according to the staff.   The patient tolerated the procedure without apparent complications.                                               Duke Salvia, M.D.    SCK/MEDQ  D:  05/24/2004  T:  05/25/2004  Job:  [id number]   cc:   W. Ashley Royalty., M.D.  [address]  [place]  [place] [address]  Fax: [phone]   Electrophysiology Laboratory   [facility]

## 2011-04-11 NOTE — Assessment & Plan Note (Signed)
[facility]                         GASTROENTEROLOGY OFFICE NOTE   [name], [name]                   MRN:          [id number]  DATE:03/02/2007                            DOB:          Mar 04, 1951    PROBLEMS:  Abdominal pain.   HISTORY OF PRESENT ILLNESS:  [name] has returned for  reevaluation.  Approximately two weeks ago she developed moderately  severe left lower quadrant pain.  She was placed on a 10-day course of  Cipro and Flagyl with resolution of her abdominal discomfort.  She has  had some mild constipation.  There is no history of melena or  hematochezia.  She has known diverticulosis involving the sigmoid colon.   MEDICATIONS:  1. Potassium.  2. Coumadin.  3. Coreg.  4. Zoloft.  5. Lasix.   PHYSICAL EXAMINATION:  VITAL SIGNS:  Pulse 80, blood pressure 92/62,  weight 202.   PHYSICAL EXAMINATION:  HEENT: EOMI. PERRLA. Sclerae are anicteric.  Conjunctivae are pink.  NECK:  Supple without thyromegaly, adenopathy or carotid bruits.  CHEST:  Clear to auscultation and percussion without adventitious  sounds.  CARDIAC:  Regular rhythm; normal S1 S2.  There are no murmurs, gallops  or rubs.  ABDOMEN:  Bowel sounds are normoactive.  Abdomen is soft, non-tender and  non-distended.  There are no abdominal masses, tenderness, splenic  enlargement or hepatomegaly.  EXTREMITIES:  Full range of motion.  No cyanosis, clubbing or edema.  RECTAL:  Deferred.   IMPRESSION:  Acute diverticulitis. Resolved.   RECOMMENDATION:  No further workup or therapy at this point.     Barbette Hair. Arlyce Dice, MD,FACG  Electronically Signed    RDK/MedQ  DD: 03/02/2007  DT: 03/02/2007  Job #: [id number]   cc:   Willow Ora, MD

## 2011-04-11 NOTE — Letter (Signed)
August 04, 2006     W. Viann Fish, M.D.  [address]  [place], [place] [address]   RE:  [name], [name]  MRN:  [id number]  /  DOB:  06/30/1951   Dear Karleen Hampshire:   [name] comes in.  She continues to feel crummy.  She says she is  really quite fatigue, and this is continuing to progress.  I wonder whether  this is a heart failure manifestation.  Recalling that she was implanted  with MADIT-CRT in the setting of class 1-2 heart failure and the request for  the LV lead was deferred, I wonder whether she may not have more heart  failure than she has given word to.  To that end, I suggest that we  undertake CPX testing to see if we can quantitate her degree of dysfunction  to see whether taking any incremental risk is justified to try and help her  feel better.   Her examination today was unrevealing with a blood pressure of 115/56.  Her  lungs were clear.  Neck veins were flat.  Heart sounds were regular.  The  extremities were without edema.   I will take the liberty of setting the CPX up and getting those results, and  then we will talk with you about them and see what it is that you think  makes the most sense to do once we have those data.   I hope you had a great trip to [facility].   Lenon Oms, MD, [facility]   SCK/MedQ  DD:  08/04/2006  DT:  08/04/2006  Job #:  [id number]

## 2011-04-11 NOTE — Discharge Summary (Signed)
[name], SIEGRIST                      ACCOUNT NO.:  [id number]   MEDICAL RECORD NO.:  [id number]                   PATIENT TYPE:  OIB   LOCATION:  4704                                 FACILITY:  [facility]   PHYSICIAN:  Duke Salvia, M.D.               DATE OF BIRTH:  1951-09-07   DATE OF ADMISSION:  05/24/2004  DATE OF DISCHARGE:  05/25/2004                                 DISCHARGE SUMMARY   DISCHARGE DIAGNOSES:  1. Nonischemic cardiomyopathy with an ejection fraction of less than 30%     status post biventricular automated implantable cardioverter-     defibrillator implantation this admission.  2. Class II congestive heart failure.  3. Right axis deviation and intraventricular conduction delay.  4. History of breast cancer status post bilateral mastectomies.   PROCEDURES THIS ADMISSION:  1. EP study.  2. Status post biventricular pacemaker and  AICD implantation with a Guidant     device.   HOSPITAL COURSE:  Please see Dr. Odessa Fleming note from February 14, 2004, for  complete details.  Briefly this 60 year old female with a history of  nonischemic cardiomyopathy was evaluated and placed in the MADIT CRT trial.  She was randomized and brought into [facility] on May 24, 2004, for  biventricular pacemaker/AICD implantation.  She underwent the procedure by  Dr. Graciela Husbands.  There were no apparent complications.  Her Coumadin was  restarted on the evening of May 24, 2004.  Her pacer site was without  hematoma or erythema.  Her EKG revealed atrially sense ventricular paced  rhythm.  She was felt stable enough for discharge to home.  She would follow  up with Dr. Graciela Husbands in August and in the pacer clinic later this month.   LABORATORY DATA:  White count 6400, hemoglobin 14.1, hematocrit 40.4,  platelet count 274,000.  INR at discharge 1.2.  Sodium 139, potassium 4.3,  chloride 103, CO2 27, glucose 92, BUN 11, creatinine 1.0.   DISCHARGE MEDICATIONS:  1. Potassium 10 mEq  b.i.d.  2. Lasix 40 mg daily.  3. Coumadin 6.5 mg daily.  4. Zoloft 25 mg daily.  5. Zestril 10 mg daily.  6. Coreg 12.5 mg b.i.d.   PAIN MANAGEMENT:  Tylenol as needed.   ACTIVITY:  The patient has been instructed on how to move her arm, has been  provided instruction sheet.   DIET:  Low-fat, low-sodium.   WOUND CARE:  The patient should observe her pacer site for any swelling or  bleeding or drainage and call office with concerns.   FOLLOW UP:  1. Pacer Clinic at [facility] in [place] on July 18 at 9:15 a.m.  2. She will see Dr. Graciela Husbands in [place] on August 30 at 10 a.m.  3. She is to continue to follow up with Dr. Ruthine Dose and Dr. Viann Fish as     scheduled.      Tereso Newcomer, P.A.  Duke Salvia, M.D.    SW/MEDQ  D:  05/25/2004  T:  05/25/2004  Job:  [id number]   cc:   Darden Palmer., M.D.  [address]  [place]  [place] [address]  Fax: [phone]   Angelena Sole, M.D. [facility]

## 2011-04-11 NOTE — Letter (Signed)
February 22, 2007    [name], M.D.  [address]  [place], [place] [address]   RE:  Alejandra, Kirby  MRN:  [id number]  /  DOB:  [birth year]   Dear [name],   I hope you had a great trip.   [name] comes in. I have to confess that we dropped the ball. We  undertook cardiopulmonary stress testing in the fall to try to sort out  the degree to which her shortness of breath might be sufficiently  attributable to heart failure to justify a repeat procedure for her  class II heart failure. Unfortunately, these results did not get to you,  and they got past me. In any case, they showed a couple of things that  are noteworthy. The first is that her blood pressure response to  activity was inappropriate in that her blood pressure at stage 3 in this  protocol for CPX was 138, but at peak exercise, it had fallen to 104.  The second thing was that she had a elevated VE/VCO2 slope that suggests  some degree of circulatory limitation, i.e. heart failure, and thirdly  that her peak CO2 max was 17.9 which is 93% of her age/gender/weight  match norms.   Based on that, I do not know that pursuing a restructuring of her system  makes sense. I am concerned about the hypotension response to activity  which is occasionally associated with ischemia. Her cardiomyopathy is  felt to be nonischemic as you know, but I wonder to what degree a  superimposed process may have intervened.   She remains shortness of breath with modest exertion.   Her current medications are notable for a Coreg dose of 12.5. Her  Zestril dose is at 10. She is on diuretics and Coumadin.   On examination, her blood pressure is 118/70. Her pulse is 82.  LUNGS:  Were clear.  HEART:  Sounds were regular.  EXTREMITIES:  Were without edema.   Interrogation of her Guidant Contak Renewal device demonstrates that her  P wave is 2.3 with impedence of 530, threshold of 0.6 at 0.5. The R wave  was 25 with  impedence of 599, a threshold of 1 volt at 0.5. Battery  voltage is 2.91. The RV pacing is on. LV pacing is off.   IMPRESSION:  1. Nonischemic cardiomyopathy.  2. Class II congestive heart failure.  3. Left bundle branch block, now with intrinsic conduction with 0%      pacing.  4. Inappropriate blood pressure response to activity.  5. Good VO2 max.   [name], [name]'s issues are as outlined above. I will plan to  talk with you about the CPX and what your thoughts are about how to  pursue that. I will plan to get also the echocardiogram for you from  July 2006. This was done as part of the MADIT-CRT protocol.    Sincerely,      Alejandra Salvia, MD, [facility]  Electronically Signed    SCK/MedQ  DD: 02/22/2007  DT: 02/22/2007  Job #: [id number]

## 2011-04-13 NOTE — Telephone Encounter (Signed)
ok 

## 2011-04-14 NOTE — Telephone Encounter (Signed)
appt scheduled

## 2011-04-15 ENCOUNTER — Ambulatory Visit (INDEPENDENT_AMBULATORY_CARE_PROVIDER_SITE_OTHER): Payer: BC Managed Care – PPO | Admitting: *Deleted

## 2011-04-15 DIAGNOSIS — I429 Cardiomyopathy, unspecified: Secondary | ICD-10-CM

## 2011-04-15 DIAGNOSIS — Z7901 Long term (current) use of anticoagulants: Secondary | ICD-10-CM

## 2011-04-15 NOTE — Patient Instructions (Addendum)
INR 3.4, current dose 41 mg weekly. Plan: Coumadin 5 mg one by mouth daily, 7 mg Tuesday and Friday (39mg /week) Recheck ---3 weeks [name]    Pt aware [name]

## 2011-05-12 ENCOUNTER — Other Ambulatory Visit: Payer: Self-pay | Admitting: Internal Medicine

## 2011-05-26 ENCOUNTER — Encounter: Payer: Self-pay | Admitting: Internal Medicine

## 2011-05-26 ENCOUNTER — Ambulatory Visit (INDEPENDENT_AMBULATORY_CARE_PROVIDER_SITE_OTHER): Payer: BC Managed Care – PPO | Admitting: Internal Medicine

## 2011-05-26 VITALS — BP 128/82 | HR 90 | Temp 98.7°F | Wt 203.0 lb

## 2011-05-26 DIAGNOSIS — I429 Cardiomyopathy, unspecified: Secondary | ICD-10-CM

## 2011-05-26 DIAGNOSIS — Z7901 Long term (current) use of anticoagulants: Secondary | ICD-10-CM

## 2011-05-26 DIAGNOSIS — J019 Acute sinusitis, unspecified: Secondary | ICD-10-CM | POA: Insufficient documentation

## 2011-05-26 MED ORDER — AMOXICILLIN 500 MG PO CAPS: 1000.0000 mg | ORAL_CAPSULE | Freq: Two times a day (BID) | ORAL | Status: AC

## 2011-05-26 NOTE — Patient Instructions (Signed)
Coumadin 2mg  daily but take  7 mg on Wednesday-----> re check in 2 weeks Rest, fluids , tylenol For cough, take Mucinex DM twice a day as needed  Take the antibiotic as prescribed...Marland KitchenMarland KitchenAmoxicillin Saline nasal sprays 3 times a day Call if no better in few days Call anytime if the symptoms are severe, you have high fever, persistent sinus pain

## 2011-05-26 NOTE — Assessment & Plan Note (Addendum)
On coumadin, INR elevated x 2: Decrease dose from 39mg /week to Coumadin 2mg  qd, 7 mg Wednesday ----37mg /week inr in 2 weeks

## 2011-05-26 NOTE — Progress Notes (Signed)
  Subjective:    Patient ID: [name], female    DOB: September 16, 1951, 60 y.o.   MRN: [id number]  HPI 2 weeks history of facial pressure, R>L maxillary area and at the right frontal sinus area. Also needs her Coumadin checked, good medication compliance.  Past Medical History  Diagnosis Date  . Breast ca     twice first on L breast 1982 w/ chest wall involvment, then a second breast ca on the R in 1987  . CHF (congestive heart failure)     due to Adriamycin,s/p ICD-Cardiomyopathy  . Depression   . Insomnia   . Headache   . Diverticulitis   . Osteopenia   . Renal calculus   . Headache   . Menopause     since age 62s    Past Surgical History  Procedure Date  . Mastectomy     B  . Oophorectomy     B, per genticists advise at [place]  . Cardiac defibrillator placement     AICD Replaced-5/09    Review of Systems No fever or chills No postnasal dripping Mild cough without sputum production. She does cough up some green mucus, she believes from the sinuses. No nausea or vomiting No chest congestion.    Objective:   Physical Exam  Constitutional: She is oriented to person, place, and time. She appears well-developed and well-nourished. No distress.  HENT:  Head: Normocephalic and atraumatic.  Right Ear: External ear normal.  Left Ear: External ear normal.       Mild tenderness to palpation R>L maxillary sinuses>R frontal sinus  Cardiovascular: Normal rate and regular rhythm.   Murmur heard. Pulmonary/Chest: Effort normal and breath sounds normal. No respiratory distress. She has no wheezes.       A few rhonchi with cough, no crackles  Musculoskeletal: She exhibits no edema.  Neurological: She is alert and oriented to person, place, and time.  Skin: She is not diaphoretic.          Assessment & Plan:

## 2011-05-26 NOTE — Assessment & Plan Note (Signed)
Symptoms consistent with sinusitis, on exam she also has some bronchitis. She is allergic to cephalexin but has tolerated penicillin before without problems. See  instructions

## 2011-07-11 ENCOUNTER — Other Ambulatory Visit: Payer: Self-pay | Admitting: Internal Medicine

## 2011-07-14 NOTE — Telephone Encounter (Signed)
Request for Coumadin [Jantoven 2 mg & Warfarin 5 mg]

## 2011-07-18 ENCOUNTER — Ambulatory Visit (INDEPENDENT_AMBULATORY_CARE_PROVIDER_SITE_OTHER): Payer: BC Managed Care – PPO | Admitting: *Deleted

## 2011-07-18 DIAGNOSIS — Z7901 Long term (current) use of anticoagulants: Secondary | ICD-10-CM

## 2011-07-18 DIAGNOSIS — I428 Other cardiomyopathies: Secondary | ICD-10-CM

## 2011-07-18 NOTE — Patient Instructions (Addendum)
Coumadin 5 mg qd and 7 mg  on Wednesday Recheck 10 days  [facility]

## 2011-07-18 NOTE — Progress Notes (Signed)
  Subjective:    Patient ID: [name], female    DOB: May 08, 1951, 60 y.o.   MRN: [id number]  HPI    Review of Systems     Objective:   Physical Exam        Assessment & Plan:  Currently: 2mg  a day, 7 mg Wednesday (misunderstanding , she was supposed to take 5 mg qd and 7 on Wednesday)  rec : 5mg  daily and 7 mg on Wednesday

## 2011-08-09 ENCOUNTER — Other Ambulatory Visit: Payer: Self-pay | Admitting: Internal Medicine

## 2011-08-15 ENCOUNTER — Ambulatory Visit (INDEPENDENT_AMBULATORY_CARE_PROVIDER_SITE_OTHER): Payer: BC Managed Care – PPO | Admitting: *Deleted

## 2011-08-15 DIAGNOSIS — I429 Cardiomyopathy, unspecified: Secondary | ICD-10-CM

## 2011-08-15 DIAGNOSIS — Z7901 Long term (current) use of anticoagulants: Secondary | ICD-10-CM

## 2011-08-15 LAB — POCT INR: INR: 2.6

## 2011-08-15 NOTE — Patient Instructions (Addendum)
Return to office in 2 weeks for PT/INR   Continue current dose: Take 5 mg daily except 7mg  on Wed.  ----------------------------------------------------------------------- You mean 5mg  qd and 7 mg Wednesday correct? [name] ----------------------------------------------------------------- Pt is aware to Take 5 mg daily except 7mg  on Wed.

## 2011-08-21 LAB — BASIC METABOLIC PANEL
Chloride: 107
GFR calc Af Amer: >60
GFR calc non Af Amer: >60
Potassium: 4
Sodium: 142

## 2011-08-21 LAB — COMPREHENSIVE METABOLIC PANEL
ALT: 26
Alkaline Phosphatase: 59
BUN: 10
CO2: 28
Calcium: 9.6
GFR calc non Af Amer: >60
Glucose, Bld: 116 — ABNORMAL HIGH
Sodium: 142
Total Protein: 7

## 2011-08-21 LAB — CARDIAC PANEL(CRET KIN+CKTOT+MB+TROPI)
CK, MB: 1.7
CK, MB: 1.8
Relative Index: INVALID
Total CK: 111
Total CK: 79
Troponin I: 0.01

## 2011-08-21 LAB — ABO/RH: ABO/RH(D): A POS

## 2011-08-21 LAB — PREPARE FRESH FROZEN PLASMA

## 2011-08-21 LAB — LIPID PANEL
Cholesterol: 185
HDL: 42
Total CHOL/HDL Ratio: 4.4

## 2011-08-21 LAB — DIFFERENTIAL
Basophils Relative: 0
Eosinophils Absolute: 0.1
Lymphs Abs: 2.4
Monocytes Absolute: 0.8
Monocytes Relative: 13 — ABNORMAL HIGH
Neutrophils Relative %: 42 — ABNORMAL LOW

## 2011-08-21 LAB — CBC
HCT: 39.1
Hemoglobin: 13.2
MCHC: 33.8
MCV: 91.1
RBC: 4.29
WBC: 5.6

## 2011-08-21 LAB — PROTIME-INR
INR: 3.7 — ABNORMAL HIGH
Prothrombin Time: 20.4 — ABNORMAL HIGH
Prothrombin Time: 38.4 — ABNORMAL HIGH

## 2011-08-21 LAB — B-NATRIURETIC PEPTIDE (CONVERTED LAB): Pro B Natriuretic peptide (BNP): 345 — ABNORMAL HIGH

## 2011-08-25 LAB — POCT URINALYSIS DIP (DEVICE)
Glucose, UA: NEGATIVE
Ketones, ur: 40 — AB
Operator id: 270961
Specific Gravity, Urine: 1.015
Urobilinogen, UA: 4 — ABNORMAL HIGH

## 2011-09-01 ENCOUNTER — Encounter: Payer: Self-pay | Admitting: Internal Medicine

## 2011-09-01 ENCOUNTER — Ambulatory Visit (INDEPENDENT_AMBULATORY_CARE_PROVIDER_SITE_OTHER): Payer: BC Managed Care – PPO | Admitting: Internal Medicine

## 2011-09-01 VITALS — BP 112/68 | HR 69 | Temp 98.3°F | Resp 14 | Wt 196.0 lb

## 2011-09-01 DIAGNOSIS — I429 Cardiomyopathy, unspecified: Secondary | ICD-10-CM

## 2011-09-01 DIAGNOSIS — J40 Bronchitis, not specified as acute or chronic: Secondary | ICD-10-CM

## 2011-09-01 DIAGNOSIS — I509 Heart failure, unspecified: Secondary | ICD-10-CM

## 2011-09-01 LAB — POCT INR: INR: 2.7

## 2011-09-01 MED ORDER — AZITHROMYCIN 250 MG PO TABS: ORAL_TABLET | ORAL | Status: AC

## 2011-09-01 MED ORDER — ALBUTEROL SULFATE HFA 108 (90 BASE) MCG/ACT IN AERS: 2.0000 | INHALATION_SPRAY | Freq: Four times a day (QID) | RESPIRATORY_TRACT | Status: DC

## 2011-09-01 NOTE — Assessment & Plan Note (Signed)
INR therapeutic, see instructions

## 2011-09-01 NOTE — Assessment & Plan Note (Addendum)
Few ronchi on exam, reports wheezing on-off, has used ventolin before.  likes treatment [place]. Plan: See instructions Aware that  zpack may affect coagulation, to see a local MD if problems

## 2011-09-01 NOTE — Progress Notes (Signed)
  Subjective:    Patient ID: [name], female    DOB: [birth year], 60 y.o.   MRN: [id number]  HPI Sx started 4 days ago: Chest congestion, cough, some SOB and sputum production Needs INR checked  Past Medical History  Diagnosis Date  . Breast ca     twice first on L breast 1982 w/ chest wall involvment, then a second breast ca on the R in 1987  . CHF (congestive heart failure)     due to Adriamycin,s/p ICD-Cardiomyopathy  . Depression   . Insomnia   . Headache   . Diverticulitis   . Osteopenia   . Renal calculus   . Headache   . Menopause     since age 64s   Past Surgical History  Procedure Date  . Mastectomy     B  . Oophorectomy     B, per genticists advise at [place]  . Cardiac defibrillator placement     AICD Replaced-5/09     Review of Systems No fever, some wheezing no sinus congestion-pain, post nasal dripp    Objective:   Physical Exam  Constitutional: She appears well-developed and well-nourished. No distress.  HENT:  Head: Normocephalic and atraumatic.  Right Ear: External ear normal.  Left Ear: External ear normal.  Mouth/Throat: No oropharyngeal exudate.       Face NTTP   Cardiovascular:       + murmur  Pulmonary/Chest:       B ronchi that clear w/ cough, no wheezing per se , no increased WOB  Skin: She is not diaphoretic.          Assessment & Plan:

## 2011-09-01 NOTE — Patient Instructions (Signed)
Continue same coumadin, recheck 4 weeks For cough, take Mucinex DM twice a day as needed  If wheezing: albuterol as needed  Take the antibiotic as prescribed if no no better in few days  (zithromax) Call if no better in few days Call anytime if the symptoms are severe

## 2011-09-25 ENCOUNTER — Encounter: Payer: Self-pay | Admitting: Internal Medicine

## 2011-09-26 ENCOUNTER — Ambulatory Visit (INDEPENDENT_AMBULATORY_CARE_PROVIDER_SITE_OTHER): Payer: BC Managed Care – PPO | Admitting: Internal Medicine

## 2011-09-26 ENCOUNTER — Encounter: Payer: Self-pay | Admitting: Internal Medicine

## 2011-09-26 VITALS — BP 108/64 | HR 75 | Temp 98.2°F | Resp 18 | Ht 65.35 in | Wt 194.2 lb

## 2011-09-26 DIAGNOSIS — Z7901 Long term (current) use of anticoagulants: Secondary | ICD-10-CM

## 2011-09-26 DIAGNOSIS — J3489 Other specified disorders of nose and nasal sinuses: Secondary | ICD-10-CM

## 2011-09-26 DIAGNOSIS — J40 Bronchitis, not specified as acute or chronic: Secondary | ICD-10-CM

## 2011-09-26 DIAGNOSIS — I429 Cardiomyopathy, unspecified: Secondary | ICD-10-CM

## 2011-09-26 DIAGNOSIS — J329 Chronic sinusitis, unspecified: Secondary | ICD-10-CM

## 2011-09-26 LAB — POCT INR: INR: 2.7

## 2011-09-26 MED ORDER — PREDNISONE 10 MG PO TABS: ORAL_TABLET | ORAL | Status: AC

## 2011-09-26 MED ORDER — AMOXICILLIN 500 MG PO CAPS: 1000.0000 mg | ORAL_CAPSULE | Freq: Two times a day (BID) | ORAL | Status: AC

## 2011-09-26 NOTE — Progress Notes (Signed)
  Subjective:    Patient ID: [name], female    DOB: 1951-03-01, 60 y.o.   MRN: [id number]  HPI Was seen last month with bronchitis, prescribed a Z-Pak, overall feels better except for a persistent cough. One week ago developed sinus congestion, runny nose and sneezing.  Past Medical History  Diagnosis Date  . Breast ca     twice first on L breast 1982 w/ chest wall involvment, then a second breast ca on the R in 1987  . CHF (congestive heart failure)     due to Adriamycin,s/p ICD-Cardiomyopathy  . Depression   . Insomnia   . Headache   . Diverticulitis   . Osteopenia   . Renal calculus   . Headache   . Menopause     since age 27s   Past Surgical History  Procedure Date  . Mastectomy     B  . Oophorectomy     B, per genticists advise at [place]  . Cardiac defibrillator placement     AICD Replaced-5/09     Review of Systems No fever or chills, she does have facial pain mostly at the left frontal sinus with cough, pain is sharp and intense. Denies  itchy nose or eye, no major problems with chest congestion    Objective:   Physical Exam  Constitutional: She appears well-developed and well-nourished. No distress.  HENT:  Head: Normocephalic and atraumatic.  Right Ear: External ear normal.  Left Ear: External ear normal.       Nose congested, face symmetric, face is tender to palpation at the maxillary sinuses, nontender at the frontal sinuses   Pulmonary/Chest: Effort normal and breath sounds normal. No respiratory distress. She has no wheezes. She has no rales.  Skin: She is not diaphoretic.       Assessment & Plan:  Seen w/  Bronchitis last month, better except for cough, now present w/  Sinus congestion-tenderness, likely sinusitis See instructions

## 2011-09-26 NOTE — Patient Instructions (Signed)
Rest, fluids , tylenol For cough, take Mucinex DM twice a day as needed  Take the antibiotic as prescribed---> Amoxicillin, also prednisone x 5 days  Saline nasal rinse once or twice a day until better  Call if no better in few days Call anytime if the symptoms are severe INR is 2.7 , continue same coumadin, came back in 1 month

## 2011-09-26 NOTE — Assessment & Plan Note (Signed)
On Coumadin, see instructions

## 2011-10-01 ENCOUNTER — Other Ambulatory Visit: Payer: Self-pay | Admitting: Internal Medicine

## 2011-10-02 NOTE — Telephone Encounter (Signed)
Done Rx filled

## 2011-10-10 ENCOUNTER — Other Ambulatory Visit: Payer: Self-pay | Admitting: Internal Medicine

## 2011-11-06 ENCOUNTER — Ambulatory Visit (INDEPENDENT_AMBULATORY_CARE_PROVIDER_SITE_OTHER): Payer: BC Managed Care – PPO

## 2011-11-06 DIAGNOSIS — Z23 Encounter for immunization: Secondary | ICD-10-CM

## 2011-11-13 ENCOUNTER — Ambulatory Visit (INDEPENDENT_AMBULATORY_CARE_PROVIDER_SITE_OTHER): Payer: BC Managed Care – PPO | Admitting: Internal Medicine

## 2011-11-13 ENCOUNTER — Encounter: Payer: Self-pay | Admitting: Internal Medicine

## 2011-11-13 DIAGNOSIS — I429 Cardiomyopathy, unspecified: Secondary | ICD-10-CM

## 2011-11-13 DIAGNOSIS — J019 Acute sinusitis, unspecified: Secondary | ICD-10-CM

## 2011-11-13 DIAGNOSIS — Z7901 Long term (current) use of anticoagulants: Secondary | ICD-10-CM

## 2011-11-13 DIAGNOSIS — R05 Cough: Secondary | ICD-10-CM

## 2011-11-13 DIAGNOSIS — R059 Cough, unspecified: Secondary | ICD-10-CM

## 2011-11-13 LAB — POCT INR: INR: 2.2

## 2011-11-13 MED ORDER — AMOXICILLIN 500 MG PO CAPS: 1000.0000 mg | ORAL_CAPSULE | Freq: Two times a day (BID) | ORAL | Status: AC

## 2011-11-13 NOTE — Assessment & Plan Note (Signed)
Ongoing cough since 08/2011. Chart was reviewed, see history of present illness. Plan:  Refer to pulmonary Chest x-ray

## 2011-11-13 NOTE — Progress Notes (Signed)
  Subjective:    Patient ID: [name], female    DOB: [birth year], 60 y.o.   MRN: [id number]  HPI Acute visit for upper respiratory symptoms 5 days ago developed facial pressure, postnasal drip, 2 days ago she also started with eye congestion.  In addition, she continue with dry cough since October 2012 when she was seen with bronchitis and wheezing. She was prescribed a Z-Pak and albuterol.  She was seen again 09-2011, prescribed amoxicillin and Deltasone. Again the cough is dry, and the day or night, very rarely produce any sputum.  w/ this particular respiratory infection, the cough is not more than baseline  Due for the INR check.  Past Medical History  Diagnosis Date  . Breast ca     twice first on L breast 1982 w/ chest wall involvment, then a second breast ca on the R in 1987  . CHF (congestive heart failure)     due to Adriamycin,s/p ICD-Cardiomyopathy  . Depression   . Insomnia   . Headache   . Diverticulitis   . Osteopenia   . Renal calculus   . Headache   . Menopause     since age 70s   Past Surgical History  Procedure Date  . Mastectomy     B  . Oophorectomy     B, per genticists advise at [place]  . Cardiac defibrillator placement     AICD Replaced-5/09    Review of Systems No fever chills No nausea, vomiting, diarrhea No aches and pains. No GERD symptoms. She does have some postnasal dripping for the last 5 days but otherwise no interval as few months. She is a nonsmoker has no history of asthma.     Objective:   Physical Exam  Constitutional: She appears well-developed and well-nourished.  HENT:  Head: Normocephalic and atraumatic.  Mouth/Throat: No oropharyngeal exudate.       Face symmetric, nose congested, maxillary sinuses tender to palpation bilaterally.   Cardiovascular: Normal rate, regular rhythm and normal heart sounds.   No murmur heard. Pulmonary/Chest: Breath sounds normal. No respiratory distress. She has no wheezes.  She has no rales.      Assessment & Plan:

## 2011-11-13 NOTE — Patient Instructions (Addendum)
Get a chest x-ray done Rest, fluids , tylenol For cough, take Mucinex DM twice a day as needed  For congestion use Flonase every day and also astepro 2 sprays on each side of the nose at bedtime until samples gone  If not improving in the next 3 or 4 days, then start Amoxicillin Call if no better in few days Call anytime if the symptoms are severe -------------------------------- Same coumadin, next INR in one month ------------------------------- We are referring you to a lung specialist due to cough

## 2011-11-13 NOTE — Assessment & Plan Note (Addendum)
INR done today 2.2, no change, four-week

## 2011-11-13 NOTE — Assessment & Plan Note (Addendum)
Symptoms of the last 5 days consistent with sinusitis,viral vs bacterial ----> we'll treat in the standard fashion. See instructions

## 2011-11-14 ENCOUNTER — Ambulatory Visit (INDEPENDENT_AMBULATORY_CARE_PROVIDER_SITE_OTHER)
Admission: RE | Admit: 2011-11-14 | Discharge: 2011-11-14 | Disposition: A | Discharge status: Home / Self Care | Payer: BC Managed Care – PPO | Source: Ambulatory Visit | Attending: Internal Medicine | Admitting: Internal Medicine

## 2011-11-14 DIAGNOSIS — R05 Cough: Secondary | ICD-10-CM

## 2011-11-17 ENCOUNTER — Institutional Professional Consult (permissible substitution): Payer: BC Managed Care – PPO | Admitting: Internal Medicine

## 2011-11-26 ENCOUNTER — Telehealth: Payer: Self-pay | Admitting: Gastroenterology

## 2011-11-26 MED ORDER — CIPROFLOXACIN HCL 500 MG PO TABS: 500.0000 mg | ORAL_TABLET | Freq: Two times a day (BID) | ORAL | Status: DC

## 2011-11-26 MED ORDER — METRONIDAZOLE 500 MG PO TABS: 500.0000 mg | ORAL_TABLET | Freq: Two times a day (BID) | ORAL | Status: DC

## 2011-11-26 NOTE — Telephone Encounter (Signed)
Please prescribe Cipro 500 mg twice a day and Flagyl 500 mg twice a day. Each for 7 days. Also recommend low residue diet. The patient needs to followup in the office for a checkup next week. Can see an extender, if available. She should call in the interim, if her symptoms worsen, despite therapy.

## 2011-11-26 NOTE — Telephone Encounter (Signed)
Pt aware and rx sent to pharmacy. Pt scheduled to see Dr. Arlyce Dice 12/04/11@1 :45pm. Pt aware of appt date and time.

## 2011-11-26 NOTE — Telephone Encounter (Signed)
[name] pt with history of diverticulitis. Last seen by Mike Gip PA 08/21/10 for diverticulitis flare. Pt was given Cipro and Flagyl. Pt is calling stating that she is having a flare. Reports left lower quadrant pain below her belly button radiating across her stomach. States the pain started 11/24/11. She is also having some nausea. Dr. Marina Goodell as doc of the day please advise.

## 2011-11-27 ENCOUNTER — Other Ambulatory Visit: Payer: Self-pay | Admitting: Cardiology

## 2011-12-04 ENCOUNTER — Other Ambulatory Visit (INDEPENDENT_AMBULATORY_CARE_PROVIDER_SITE_OTHER): Payer: BC Managed Care – PPO

## 2011-12-04 ENCOUNTER — Ambulatory Visit (INDEPENDENT_AMBULATORY_CARE_PROVIDER_SITE_OTHER): Payer: BC Managed Care – PPO | Admitting: Gastroenterology

## 2011-12-04 ENCOUNTER — Encounter: Payer: Self-pay | Admitting: Gastroenterology

## 2011-12-04 VITALS — BP 108/64 | HR 74 | Ht 65.0 in | Wt 195.0 lb

## 2011-12-04 DIAGNOSIS — R319 Hematuria, unspecified: Secondary | ICD-10-CM | POA: Insufficient documentation

## 2011-12-04 DIAGNOSIS — K5732 Diverticulitis of large intestine without perforation or abscess without bleeding: Secondary | ICD-10-CM

## 2011-12-04 DIAGNOSIS — K5730 Diverticulosis of large intestine without perforation or abscess without bleeding: Secondary | ICD-10-CM

## 2011-12-04 DIAGNOSIS — Z1211 Encounter for screening for malignant neoplasm of colon: Secondary | ICD-10-CM | POA: Insufficient documentation

## 2011-12-04 DIAGNOSIS — K5790 Diverticulosis of intestine, part unspecified, without perforation or abscess without bleeding: Secondary | ICD-10-CM

## 2011-12-04 LAB — URINALYSIS, ROUTINE W REFLEX MICROSCOPIC
Nitrite: NEGATIVE
pH: 6 (ref 5.0–8.0)

## 2011-12-04 MED ORDER — PEG-KCL-NACL-NASULF-NA ASC-C 100 G PO SOLR: 1.0000 | Freq: Once | ORAL | Status: DC

## 2011-12-04 NOTE — Assessment & Plan Note (Signed)
Prior colonoscopy 2003. Plan followup colonoscopy in several weeks allowing for diverticulitis to subside. I will check with Dr. Donnie Aho, her cardiologist, whether Coumadin can be held.

## 2011-12-04 NOTE — Progress Notes (Signed)
History of Present Illness:  [name] is a 61 year old white female here following empiric therapy for presumed diverticulitis. Approximately a week ago she developed moderately severe left lower quadrant pain. She was placed on Cipro and Flagyl. Symptoms have significantly improved. She has mild lower abdominal discomfort at this time. She is  without fever or chills.    Review of Systems: She believes that she is seen blood in the urine. She denies dysuria or urinary frequency. Pertinent positive and negative review of systems were noted in the above HPI section. All other review of systems were otherwise negative.    Current Medications, Allergies, Past Medical History, Past Surgical History, Family History and Social History were reviewed in [employer] [facility] electronic medical record  Vital signs were reviewed in today's medical record. Physical Exam: General: Well developed , well nourished, no acute distress Head: Normocephalic and atraumatic Eyes:  sclerae anicteric, EOMI Ears: Normal auditory acuity Mouth: No deformity or lesions Lungs: Clear throughout to auscultation Heart: Regular rate and rhythm; norubs or bruits; there is a 3/6 squeaky early systolic murmur Abdomen: Soft, non tender and non distended. No masses, hepatosplenomegaly or hernias noted. Normal Bowel sounds Rectal:deferred Musculoskeletal: Symmetrical with no gross deformities  Pulses:  Normal pulses noted Extremities: No clubbing, cyanosis, edema or deformities noted Neurological: Alert oriented x 4, grossly nonfocal Psychological:  Alert and cooperative. Normal mood and affect

## 2011-12-04 NOTE — Patient Instructions (Signed)
Your Colonoscopy is scheduled on 12/25/2011 at 8am We will contact Viann Fish about holding your coumadin

## 2011-12-04 NOTE — Assessment & Plan Note (Signed)
Questionable history of hematuria.  Recommendations #1 urinalysis

## 2011-12-04 NOTE — Assessment & Plan Note (Signed)
Improving with empiric antibiotic therapy.

## 2011-12-08 ENCOUNTER — Telehealth: Payer: Self-pay

## 2011-12-08 ENCOUNTER — Encounter: Payer: Self-pay | Admitting: Pulmonary Disease

## 2011-12-08 ENCOUNTER — Ambulatory Visit (INDEPENDENT_AMBULATORY_CARE_PROVIDER_SITE_OTHER): Payer: BC Managed Care – PPO | Admitting: Pulmonary Disease

## 2011-12-08 VITALS — BP 110/64 | HR 72 | Temp 98.1°F | Ht 65.0 in | Wt 195.6 lb

## 2011-12-08 DIAGNOSIS — R05 Cough: Secondary | ICD-10-CM

## 2011-12-08 NOTE — Telephone Encounter (Signed)
Left message for pt to call back. Pt needs to have urine C&S.

## 2011-12-08 NOTE — Telephone Encounter (Signed)
Pt aware and will come for labs. 

## 2011-12-08 NOTE — Telephone Encounter (Signed)
Message copied by Michele Mcalpine on Mon Dec 08, 2011  2:37 PM ------      Message from: Melvia Heaps D      Created: Mon Dec 08, 2011  9:22 AM       Needs a urine C&S.  If negative she may need cystoscopy.  I will cc Dr. Drue Novel

## 2011-12-08 NOTE — Assessment & Plan Note (Addendum)
Your cough may be related to sinuses or silent reflux. Lisinopril can also cause a dry cough in some people (even after many years) Start ZYRTEC -D x 3-4 weeks, check your BP every week while on this If this does not help, we may trial anti-reflux medication Last resort will be to change Lisinopril to another drug in the same family OK to use DELSYM 2 tsp thrice daily for cough Call us back to report in 4 weeks

## 2011-12-08 NOTE — Progress Notes (Signed)
  Subjective:    Patient ID: [name], female    DOB: 1951-03-07, 61 y.o.   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 She has a cardiomyopathy & AICD & has been on lisinopril since 2004. She had bilateral breast CA s/p mastectomies & breast implants.  11/08/11 >>developed facial pressure, postnasal drip, also eye congestion.  In addition, she continue with dry cough since October 2012 when she was seen with bronchitis and wheezing. She was prescribed a Z-Pak and albuterol.  She was seen again 09-2011, prescribed amoxicillin and Deltasone.  She reports a cough with minimal clear- yellow sputum. She denies post nasal drip, wheezing, PND/ orthopnea or heartburn. There are no signs or symptoms of overt heart failure.    Review of Systems  Constitutional: Positive for fever. Negative for unexpected weight change.  HENT: Positive for congestion, rhinorrhea, sneezing, postnasal drip and sinus pressure. Negative for ear pain, nosebleeds, sore throat, trouble swallowing and dental problem.   Eyes: Negative for redness and itching.  Respiratory: Positive for cough, chest tightness, shortness of breath and wheezing.   Cardiovascular: Positive for palpitations. Negative for leg swelling.  Gastrointestinal: Negative for nausea, vomiting and diarrhea.  Genitourinary: Negative for dysuria.  Musculoskeletal: Negative for joint swelling.  Skin: Negative for rash.  Neurological: Negative for headaches.  Hematological: Does not bruise/bleed easily.  Psychiatric/Behavioral: Negative for dysphoric mood. The patient is nervous/anxious.        Objective:   Physical Exam Gen. Pleasant, obese, in no distress, normal affect ENT - no lesions, no post nasal drip, class 2-3 airway Neck: No JVD, no thyromegaly, no carotid bruits Lungs: no use of accessory muscles, no dullness to percussion, decreased without rales or rhonchi    Cardiovascular: Rhythm regular, heart sounds  normal, no murmurs or gallops, no peripheral edema Abdomen: soft and non-tender, no hepatosplenomegaly, BS normal. Musculoskeletal: No deformities, no cyanosis or clubbing Neuro:  alert, non focal, no tremors       Assessment & Plan:

## 2011-12-08 NOTE — Patient Instructions (Signed)
Your cough may be related to sinuses or silent reflux. Lisinopril can also cause a dry cough in some people (even after many years) Start ZYRTEC -D x 3-4 weeks, check your BP every week while on this If this does not help, we may trial anti-reflux medication Last resort will be to change Lisinopril to another drug in the same family OK to use DELSYM 2 tsp thrice daily for cough Call us back to report in 4 weeks  

## 2011-12-09 ENCOUNTER — Telehealth: Payer: Self-pay | Admitting: *Deleted

## 2011-12-09 NOTE — Telephone Encounter (Signed)
Left Message for pt to hold coumadin 5 days prior to procedure per Dr Viann Fish

## 2011-12-15 ENCOUNTER — Other Ambulatory Visit: Payer: BC Managed Care – PPO

## 2011-12-17 ENCOUNTER — Telehealth: Payer: Self-pay

## 2011-12-17 LAB — URINE CULTURE

## 2011-12-17 NOTE — Telephone Encounter (Signed)
Message copied by Michele Mcalpine on Wed Dec 17, 2011  2:37 PM ------      Message from: Melvia Heaps D      Created: Wed Dec 17, 2011  2:22 PM       Please inform the patient that lab work was normal and to continue current plan of action

## 2011-12-17 NOTE — Telephone Encounter (Signed)
Pt aware.

## 2011-12-17 NOTE — Progress Notes (Signed)
Quick Note:  Please inform the patient that lab work was normal and to continue current plan of action ______ 

## 2011-12-25 ENCOUNTER — Encounter: Payer: Self-pay | Admitting: Gastroenterology

## 2011-12-25 ENCOUNTER — Ambulatory Visit (AMBULATORY_SURGERY_CENTER): Payer: BC Managed Care – PPO | Admitting: Gastroenterology

## 2011-12-25 DIAGNOSIS — K5732 Diverticulitis of large intestine without perforation or abscess without bleeding: Secondary | ICD-10-CM

## 2011-12-25 DIAGNOSIS — K648 Other hemorrhoids: Secondary | ICD-10-CM

## 2011-12-25 DIAGNOSIS — K5730 Diverticulosis of large intestine without perforation or abscess without bleeding: Secondary | ICD-10-CM

## 2011-12-25 MED ORDER — SODIUM CHLORIDE 0.9 % IV SOLN: 500.0000 mL | INTRAVENOUS | Status: DC

## 2011-12-25 NOTE — Progress Notes (Signed)
Pt coughing intermittently during procedure. Pt coughing up clear saliva, suction pt mouth (by CRNA), moderate amount of secretions. Vitals stable. Will continue to monitor.

## 2011-12-25 NOTE — Patient Instructions (Addendum)
Restart coumadin today. Resume all medications. Information given on diverticulosis,hemorrhoids and high fiber diet. D/C Instructions reviewed with family.

## 2011-12-25 NOTE — Progress Notes (Signed)
Patient did not experience any of the following events: a burn prior to discharge; a fall within the facility; wrong site/side/patient/procedure/implant event; or a hospital transfer or hospital admission upon discharge from the facility. (G8907) Patient did not have preoperative order for IV antibiotic SSI prophylaxis. (G8918)  

## 2011-12-25 NOTE — Op Note (Signed)
[facility] [address] [place], [place]  [address]  COLONOSCOPY PROCEDURE REPORT  PATIENT:  [name], [name]  MR#:  [id number] BIRTHDATE:  1951/04/22, 60 yrs. old  GENDER:  female ENDOSCOPIST:  Barbette Hair. Arlyce Dice, MD REF. BY: PROCEDURE DATE:  12/25/2011 PROCEDURE:  Diagnostic Colonoscopy ASA CLASS:  Class II INDICATIONS:  diverticulitis Recent diverticulitis MEDICATIONS:   MAC sedation, administered by CRNA propofol 150mg IV  DESCRIPTION OF PROCEDURE:   After the risks benefits and alternatives of the procedure were thoroughly explained, informed consent was obtained.  Digital rectal exam was performed and revealed no abnormalities.   The LB 180AL [id number] endoscope was introduced through the anus and advanced to the cecum, which was identified by both the appendix and ileocecal valve, without limitations.  The quality of the prep was excellent, using MoviPrep.  The instrument was then slowly withdrawn as the colon was fully examined. <<PROCEDUREIMAGES>>  FINDINGS:  Scattered diverticula were found in the ascending colon (see image4).  Moderate diverticulosis was found (see image6). Internal Hemorrhoids were found (see image8).  This was otherwise a normal examination of the colon (see image1).   Retroflexed views in the rectum revealed no abnormalities.    The time to cecum = 1) 3.0  minutes. The scope was then withdrawn in  1) 8.0  minutes from the cecum and the procedure completed. COMPLICATIONS:  None ENDOSCOPIC IMPRESSION: 1) Diverticula, scattered in the ascending colon 2) Moderate diverticulosis sigmoid colon 3) Internal hemorrhoids 4) Otherwise normal examination RECOMMENDATIONS: 1) Continue current colorectal screening recommendations for "routine risk" patients with a repeat colonoscopy in 10 years. REPEAT EXAM:  In 10 year(s) for Colonoscopy.  ______________________________ Barbette Hair. Arlyce Dice, MD  CC:  Willow Ora, MD  n. Rosalie Doctor:   Barbette Hair. Kaplan  at 12/25/2011 08:35 AM  Tyson Alias, [phone]

## 2011-12-26 ENCOUNTER — Telehealth: Payer: Self-pay | Admitting: *Deleted

## 2011-12-26 NOTE — Telephone Encounter (Signed)
  Follow up Call-  Call back number 12/25/2011  Post procedure Call Back phone  # [phone], may leave a message  Permission to leave phone message Yes     Patient questions:  Do you have a fever, pain , or abdominal swelling? no Pain Score  0 *  Have you tolerated food without any problems? yes  Have you been able to return to your normal activities? yes  Do you have any questions about your discharge instructions: Diet   no Medications  no Follow up visit  no  Do you have questions or concerns about your Care? no  Actions: * If pain score is 4 or above: No action needed, pain <4.

## 2012-01-08 ENCOUNTER — Other Ambulatory Visit: Payer: Self-pay | Admitting: Cardiology

## 2012-01-19 ENCOUNTER — Ambulatory Visit (INDEPENDENT_AMBULATORY_CARE_PROVIDER_SITE_OTHER): Payer: BC Managed Care – PPO | Admitting: *Deleted

## 2012-01-19 DIAGNOSIS — I429 Cardiomyopathy, unspecified: Secondary | ICD-10-CM

## 2012-01-19 DIAGNOSIS — Z7901 Long term (current) use of anticoagulants: Secondary | ICD-10-CM

## 2012-01-19 NOTE — Patient Instructions (Signed)
Return to office in 4 weeks  Continue current dose: 5 mg daily except 7 mg on Wed

## 2012-02-06 ENCOUNTER — Other Ambulatory Visit: Payer: Self-pay | Admitting: Internal Medicine

## 2012-02-06 NOTE — Telephone Encounter (Signed)
Refill done.  

## 2012-02-19 ENCOUNTER — Other Ambulatory Visit: Payer: Self-pay | Admitting: Internal Medicine

## 2012-02-19 DIAGNOSIS — I509 Heart failure, unspecified: Secondary | ICD-10-CM

## 2012-02-19 DIAGNOSIS — I429 Cardiomyopathy, unspecified: Secondary | ICD-10-CM

## 2012-02-19 MED ORDER — WARFARIN SODIUM 5 MG PO TABS: ORAL_TABLET | ORAL | Status: DC

## 2012-02-19 NOTE — Telephone Encounter (Signed)
Refill done.  

## 2012-02-19 NOTE — Telephone Encounter (Signed)
Patient is out of the following medications & it has 0-refills please send to [facility]   Warfarin Sodium (Tab) COUMADIN 5 MG  [id number] - [place], [place] - [address]

## 2012-02-24 ENCOUNTER — Telehealth: Payer: Self-pay | Admitting: *Deleted

## 2012-02-24 NOTE — Telephone Encounter (Signed)
FU Call: Pt returning call to [facility]. Pt schedule appt to see MD on 03/22/12 @8 :45am. Please return pt call to discuss further if necessary.

## 2012-02-24 NOTE — Telephone Encounter (Signed)
I left a message for the patient to call at her home # to schedule an appointment with Dr. Graciela Husbands.

## 2012-02-24 NOTE — Telephone Encounter (Signed)
Call several time left message on home voice mail. Will keep trying. gesila   ----- Message ----- From: Jefferey Pica, RN Sent: 02/10/2012 4:17 PM To: Helyn App,  I got an office note from Dr. Donnie Aho stating he wanted the patient to f/u with EP since we had not seen her in a while. Can you try to call and schedule her to f/u with Dr. Graciela Husbands. I don't think it's urgent. We haven't seen her since 3/25/010. She has an ICD, but I guess Dr. Donnie Aho has been following this.   Thanks!!

## 2012-03-22 ENCOUNTER — Ambulatory Visit: Payer: BC Managed Care – PPO | Admitting: Internal Medicine

## 2012-04-08 ENCOUNTER — Ambulatory Visit: Payer: BC Managed Care – PPO

## 2012-04-12 ENCOUNTER — Telehealth: Payer: Self-pay | Admitting: Internal Medicine

## 2012-04-12 ENCOUNTER — Ambulatory Visit (INDEPENDENT_AMBULATORY_CARE_PROVIDER_SITE_OTHER): Payer: Managed Care, Other (non HMO) | Admitting: *Deleted

## 2012-04-12 VITALS — BP 118/72 | HR 63

## 2012-04-12 DIAGNOSIS — Z7901 Long term (current) use of anticoagulants: Secondary | ICD-10-CM

## 2012-04-12 DIAGNOSIS — I429 Cardiomyopathy, unspecified: Secondary | ICD-10-CM

## 2012-04-12 LAB — POCT INR: INR: 2.9

## 2012-04-12 MED ORDER — ZOLPIDEM TARTRATE 10 MG PO TABS: ORAL_TABLET | ORAL | Status: DC

## 2012-04-12 NOTE — Telephone Encounter (Signed)
Refill: Zolpidem 10mg  tab. No Qty or directions.

## 2012-04-12 NOTE — Telephone Encounter (Signed)
#  90 with zero refills. Last filled on 11.16.12 Ok to refill?

## 2012-04-12 NOTE — Telephone Encounter (Signed)
RX sent

## 2012-04-12 NOTE — Telephone Encounter (Signed)
Zolpidem 10 mg one by mouth each bedtime when necessary, #90 and one refill

## 2012-04-15 ENCOUNTER — Encounter: Payer: Self-pay | Admitting: Internal Medicine

## 2012-04-15 ENCOUNTER — Ambulatory Visit (INDEPENDENT_AMBULATORY_CARE_PROVIDER_SITE_OTHER): Payer: Managed Care, Other (non HMO) | Admitting: Internal Medicine

## 2012-04-15 VITALS — BP 88/62 | HR 73 | Ht 65.0 in | Wt 191.0 lb

## 2012-04-15 DIAGNOSIS — Z95810 Presence of automatic (implantable) cardiac defibrillator: Secondary | ICD-10-CM

## 2012-04-15 DIAGNOSIS — I509 Heart failure, unspecified: Secondary | ICD-10-CM

## 2012-04-15 LAB — ICD DEVICE OBSERVATION
AL AMPLITUDE: 3.4 mv
AL IMPEDENCE ICD: 453 Ohm
AL THRESHOLD: 0.8 V
ATRIAL PACING ICD: 0 pct
DEV-0020ICD: NEGATIVE
DEVICE MODEL ICD: 144036
RV LEAD AMPLITUDE: 16.4 mv
TZAT-0001FASTVT: 2
TZAT-0004FASTVT: 4
TZAT-0018FASTVT: NEGATIVE
TZAT-0019FASTVT: 7.5 V
TZAT-0020FASTVT: 1 ms
TZAT-0020FASTVT: 1 ms
TZON-0004FASTVT: 2.5
TZON-0005FASTVT: 1
TZST-0001FASTVT: 3
TZST-0001FASTVT: 5
TZST-0001FASTVT: 6
TZST-0003FASTVT: 31 J
TZST-0003FASTVT: 31 J
VENTRICULAR PACING ICD: 0 pct

## 2012-04-15 NOTE — Patient Instructions (Addendum)
Your physician wants you to follow-up in: 1 year with Dr. Klein. You will receive a reminder letter in the mail two months in advance. If you don't receive a letter, please call our office to schedule the follow-up appointment.  Your physician recommends that you continue on your current medications as directed. Please refer to the Current Medication list given to you today.  

## 2012-04-15 NOTE — Assessment & Plan Note (Signed)
The patient's device was interrogated.  The information was reviewed. No changes were made in the programming.    

## 2012-04-15 NOTE — Assessment & Plan Note (Addendum)
Patient has class II heart failure. I wonder whether she would be a candidate for Aldactone therapy. I will defer this to Dr. Satira Sark   Based on functional status, consideration could be given to reimplantation of new left ventricular lead or consideration of an epicardial lead placement

## 2012-04-15 NOTE — Progress Notes (Addendum)
HPI  [name] is a 61 y.o. female She is status post ICD implantation as part of the MAD[facility] CRT protocol. She was randomized the left ventricular lead which is nonfunctional has been turned off and At change out 4 years ago. She had had intercurrent normalization of left ventricular systolic function but an echo done in March after returning to medical care after a two-year hiatus demonstrates ejection fraction of 25%.  She she was complaining of shortness of breath she went to see Dr. Donnie Aho in March of this year.   She has had palpitations and her device had been programmed into a monitoring zone. Evaluation today demonstrates only sinus tachycardia     Past Medical History  Diagnosis Date  . Breast CA     twice first on L breast 1982 w/ chest wall involvment, then a second breast ca on the R in 1987  . CHF (congestive heart failure)     due to Adriamycin,s/p ICD-Cardiomyopathy  . Depression   . Insomnia   . Headache   . Diverticulitis   . Osteopenia   . Renal calculus   . Headache   . Menopause     since age 89s    Past Surgical History  Procedure Date  . Mastectomy     B  . Oophorectomy     B, per genticists advise at [place] [facility]  . Cardiac defibrillator placement     AICD Replaced-5/09    Current Outpatient Prescriptions  Medication Sig Dispense Refill  . carvedilol (COREG) 12.5 MG tablet Take 12.5 mg by mouth 2 (two) times daily with a meal.        . fluticasone (FLONASE) 50 MCG/ACT nasal spray Place 2 sprays into the nose daily as needed.       . furosemide (LASIX) 40 MG tablet TAKE ONE TABLET BY MOUTH DAILY  90 tablet  3  . JANTOVEN 2 MG tablet TAKE 1 TABLET BY MOUTH ONCE DAILY OR AS PER PHYSICIAN INSTRUCTIONS  90 each  0  . lisinopril (PRINIVIL,ZESTRIL) 10 MG tablet Take 10 mg by mouth daily.        . multivitamin (THERAGRAN) per tablet Take 1 tablet by mouth daily.        . potassium chloride (KLOR-CON) 10 MEQ CR tablet Take 10 mEq by mouth 2 (two)  times daily.        . sertraline (ZOLOFT) 25 MG tablet TAKE ONE TABLET BY MOUTH DAILY  90 tablet  1  . VENTOLIN HFA 108 (90 BASE) MCG/ACT inhaler INHALE TWO PUFFS FOUR TIMES DAILY  18 g  0  . warfarin (COUMADIN) 5 MG tablet Take 1 tablet by mouth Sunday, Monday, Tuesday, Thursday, Friday, & Saturday  90 tablet  0  . zolpidem (AMBIEN) 10 MG tablet TAKE ONE TABLET BY MOUTH AT BEDTIME  90 tablet  1    Allergies  Allergen Reactions  . Cephalexin     REACTION: itching, hives    Review of Systems negative except from HPI and PMH  Physical Exam BP 88/62  Pulse 73  Ht 5\' 5"  (1.651 m)  Wt 191 lb (86.637 kg)  BMI 31.78 kg/m2 Well developed and well nourished in no acute distress HENT normal E scleral and icterus clear Neck Supple JVP flat; carotids brisk and full Clear to ausculation Regular rate and rhythm, early systolic murmur Soft with active bowel sounds No clubbing cyanosis none Edema Alert and oriented, grossly normal motor and sensory function Skin Warm and  Dry  ECG demonstrates sinus rhythm with left bundle branch block Intervals 18/17/46 Axis -73  Assessment and  Plan

## 2012-05-18 ENCOUNTER — Other Ambulatory Visit: Payer: Self-pay | Admitting: Cardiology

## 2012-05-18 ENCOUNTER — Other Ambulatory Visit: Payer: Self-pay | Admitting: Internal Medicine

## 2012-05-18 NOTE — Telephone Encounter (Signed)
Refill done.  

## 2012-05-20 ENCOUNTER — Other Ambulatory Visit: Payer: Self-pay | Admitting: Internal Medicine

## 2012-05-20 NOTE — Telephone Encounter (Signed)
Refill done.  

## 2012-06-09 ENCOUNTER — Telehealth: Payer: Self-pay | Admitting: Internal Medicine

## 2012-06-09 NOTE — Telephone Encounter (Signed)
Advise patient, she is overdue for her INR. Please arrange

## 2012-06-10 NOTE — Telephone Encounter (Signed)
Lmovm

## 2012-06-10 NOTE — Telephone Encounter (Signed)
PT scheduled for Tuesday 7.22.13 9.45am  ALSO note I did get pt to schedule CPE 9.24.13

## 2012-06-14 ENCOUNTER — Ambulatory Visit: Payer: Managed Care, Other (non HMO)

## 2012-06-16 ENCOUNTER — Ambulatory Visit (INDEPENDENT_AMBULATORY_CARE_PROVIDER_SITE_OTHER): Payer: Managed Care, Other (non HMO) | Admitting: *Deleted

## 2012-06-16 VITALS — BP 90/56 | HR 64 | Temp 98.1°F | Wt 192.0 lb

## 2012-06-16 DIAGNOSIS — Z7901 Long term (current) use of anticoagulants: Secondary | ICD-10-CM

## 2012-06-16 DIAGNOSIS — I429 Cardiomyopathy, unspecified: Secondary | ICD-10-CM

## 2012-06-16 LAB — POCT INR: INR: 2.2

## 2012-06-16 NOTE — Patient Instructions (Addendum)
Return to office in 4 weeks  Continue current dosing: Take 5mg  daily except 7mg  on Wed.   INR today 2.2

## 2012-06-21 ENCOUNTER — Telehealth: Payer: Self-pay | Admitting: Gastroenterology

## 2012-06-21 NOTE — Telephone Encounter (Signed)
Pt states that her diverticulitis is flaring up. Reports that she started having pain Sunday evening, her left side is tender to touch. States she has no fever. Requesting to have antibiotics called in for the diverticulitis. Please advise.

## 2012-06-21 NOTE — Telephone Encounter (Signed)
Begin Cipro 500 mg twice a day and Flagyl 250 mg 3 times a day. Please schedule CT of the abdomen and pelvis and have her return to the office later this week

## 2012-06-22 ENCOUNTER — Other Ambulatory Visit: Payer: Self-pay | Admitting: Gastroenterology

## 2012-06-22 DIAGNOSIS — R109 Unspecified abdominal pain: Secondary | ICD-10-CM

## 2012-06-22 MED ORDER — CIPROFLOXACIN HCL 500 MG PO TABS: 500.0000 mg | ORAL_TABLET | Freq: Two times a day (BID) | ORAL | Status: AC

## 2012-06-22 MED ORDER — METRONIDAZOLE 250 MG PO TABS: 250.0000 mg | ORAL_TABLET | Freq: Three times a day (TID) | ORAL | Status: AC

## 2012-06-22 NOTE — Telephone Encounter (Signed)
Pt aware and rx sent to pharmacy. Pt cannot come this week for OV, scheduled to see Dr. Arlyce Dice 07/02/12@9 :30am. Pt scheduled for CT of abdomen and pelvis 06/23/12@1pm  at [facility] CT arrival time 12:45pm, scan at 1pm. Pt to be NPO 4 hours prior to CT. Pt to drink one bottle of contrast at 11am and one bottle at 12 noon. Pt aware of appt dates and times.

## 2012-06-23 ENCOUNTER — Telehealth: Payer: Self-pay

## 2012-06-23 ENCOUNTER — Ambulatory Visit (INDEPENDENT_AMBULATORY_CARE_PROVIDER_SITE_OTHER)
Admission: RE | Admit: 2012-06-23 | Discharge: 2012-06-23 | Disposition: A | Discharge status: Home / Self Care | Payer: Managed Care, Other (non HMO) | Source: Ambulatory Visit | Attending: Gastroenterology | Admitting: Gastroenterology

## 2012-06-23 DIAGNOSIS — R109 Unspecified abdominal pain: Secondary | ICD-10-CM

## 2012-06-23 MED ORDER — IOHEXOL 300 MG/ML  SOLN
100.0000 mL | Freq: Once | INTRAMUSCULAR | Status: AC | PRN
  Administered 2012-06-23: 100 mL via INTRAVENOUS

## 2012-06-23 NOTE — Telephone Encounter (Signed)
Received call report from [facility] for her CT of abdomen and pelvis. Dr. Arlyce Dice notified.  IMPRESSION:  1. Acute diverticulitis in the distal descending colon just before  the transition to the sigmoid colon. There are surrounding  inflammatory changes, but no definite diverticular abscess or  evidence of frank perforation at this time.  2. 11 mm nonobstructive calculus in the lower pole collecting  system of the left kidney is similar to prior.  3. Previously noted left renal cyst has ruptured, now with a small  parenchymal scar in the interpolar aspect of left kidney.  4. Cardiomegaly with left ventricular dilatation.  5. Additional incidental findings, as above.

## 2012-07-02 ENCOUNTER — Encounter: Payer: Self-pay | Admitting: Gastroenterology

## 2012-07-02 ENCOUNTER — Ambulatory Visit (INDEPENDENT_AMBULATORY_CARE_PROVIDER_SITE_OTHER): Payer: Managed Care, Other (non HMO) | Admitting: Gastroenterology

## 2012-07-02 VITALS — BP 100/60 | HR 76 | Ht 63.5 in | Wt 193.1 lb

## 2012-07-02 DIAGNOSIS — K5732 Diverticulitis of large intestine without perforation or abscess without bleeding: Secondary | ICD-10-CM

## 2012-07-02 DIAGNOSIS — R1032 Left lower quadrant pain: Secondary | ICD-10-CM

## 2012-07-02 NOTE — Progress Notes (Signed)
History of Present Illness:  [name] was recently empirically treated with antibiotics for presumed diverticulitis. She had typical symptoms of lower abdominal pain. CT scan, which I reviewed, demonstrated changes consistent with acute diverticulitis. She is now feeling well and is without fever or pain.    Review of Systems: Pertinent positive and negative review of systems were noted in the above HPI section. All other review of systems were otherwise negative.    Current Medications, Allergies, Past Medical History, Past Surgical History, Family History and Social History were reviewed in [employer] [facility] electronic medical record  Vital signs were reviewed in today's medical record. Physical Exam: General: Well developed , well nourished, no acute distress

## 2012-07-02 NOTE — Assessment & Plan Note (Signed)
Very recent episode of documented acute diverticulitis. Last episode was approximately 7 months ago. At this point there is nothing further to do in the way of therapy or prevention. She was carefully instructed to contact the office if she develops recurrent pain.

## 2012-07-02 NOTE — Patient Instructions (Addendum)
No allergy to soy or eggs. Call back as needed

## 2012-07-05 ENCOUNTER — Other Ambulatory Visit: Payer: Self-pay | Admitting: Internal Medicine

## 2012-08-17 ENCOUNTER — Other Ambulatory Visit (HOSPITAL_COMMUNITY)
Admission: RE | Admit: 2012-08-17 | Discharge: 2012-08-17 | Disposition: A | Discharge status: Home / Self Care | Payer: Managed Care, Other (non HMO) | Source: Ambulatory Visit | Attending: Internal Medicine | Admitting: Internal Medicine

## 2012-08-17 ENCOUNTER — Encounter: Payer: Self-pay | Admitting: Internal Medicine

## 2012-08-17 ENCOUNTER — Ambulatory Visit (INDEPENDENT_AMBULATORY_CARE_PROVIDER_SITE_OTHER): Payer: Managed Care, Other (non HMO) | Admitting: Internal Medicine

## 2012-08-17 VITALS — BP 108/72 | HR 62 | Temp 98.0°F | Ht 64.0 in | Wt 188.0 lb

## 2012-08-17 DIAGNOSIS — I509 Heart failure, unspecified: Secondary | ICD-10-CM

## 2012-08-17 DIAGNOSIS — Z01419 Encounter for gynecological examination (general) (routine) without abnormal findings: Secondary | ICD-10-CM | POA: Insufficient documentation

## 2012-08-17 DIAGNOSIS — Z23 Encounter for immunization: Secondary | ICD-10-CM

## 2012-08-17 DIAGNOSIS — Z853 Personal history of malignant neoplasm of breast: Secondary | ICD-10-CM

## 2012-08-17 DIAGNOSIS — L989 Disorder of the skin and subcutaneous tissue, unspecified: Secondary | ICD-10-CM

## 2012-08-17 DIAGNOSIS — I429 Cardiomyopathy, unspecified: Secondary | ICD-10-CM

## 2012-08-17 DIAGNOSIS — Z0000 Encounter for general adult medical examination without abnormal findings: Secondary | ICD-10-CM

## 2012-08-17 LAB — CBC WITH DIFFERENTIAL/PLATELET
Basophils Relative: 0.5 % (ref 0.0–3.0)
Eosinophils Absolute: 0.2 10*3/uL (ref 0.0–0.7)
Eosinophils Relative: 2.7 % (ref 0.0–5.0)
HCT: 41.4 % (ref 36.0–46.0)
Hemoglobin: 13.6 g/dL (ref 12.0–15.0)
MCHC: 32.7 g/dL (ref 30.0–36.0)
MCV: 93.6 fl (ref 78.0–100.0)
Monocytes Absolute: 0.7 10*3/uL (ref 0.1–1.0)
Neutro Abs: 4.8 10*3/uL (ref 1.4–7.7)
RBC: 4.43 Mil/uL (ref 3.87–5.11)
WBC: 7.8 10*3/uL (ref 4.5–10.5)

## 2012-08-17 LAB — COMPREHENSIVE METABOLIC PANEL
AST: 25 U/L (ref 0–37)
Albumin: 4 g/dL (ref 3.5–5.2)
Alkaline Phosphatase: 72 U/L (ref 39–117)
BUN: 15 mg/dL (ref 6–23)
Creatinine, Ser: 0.9 mg/dL (ref 0.4–1.2)
Potassium: 4.2 mEq/L (ref 3.5–5.1)
Total Bilirubin: 0.8 mg/dL (ref 0.3–1.2)

## 2012-08-17 LAB — LIPID PANEL
LDL Cholesterol: 126 mg/dL — ABNORMAL HIGH (ref 0–99)
Total CHOL/HDL Ratio: 4

## 2012-08-17 MED ORDER — CARVEDILOL 6.25 MG PO TABS: 6.2500 mg | ORAL_TABLET | Freq: Two times a day (BID) | ORAL | Status: DC

## 2012-08-17 NOTE — Progress Notes (Signed)
  Subjective:    Patient ID: [name], female    DOB: 1951-04-02, 61 y.o.   MRN: [id number]  HPI Complete physical exam  Past Medical History: Breast cancer, twice (first on the L breast 1982 w/ chest wall involvment, then a second breast Ca on the R in 1987) Congestive heart failure, due to Adriamycin, s/p ICD Depression Insomnia Headache Diverticulitis, hx of Osteopenia RENAL CALCULUS, HX OF (ICD-V13.01) HEADACHE Menopause since her 30s   Past Surgical History: Mastectomy, B Oophorectomy B,  per a geneticists advise at [facility] Defibrillator-AICD Replaced 5/09  Family History: MI--no DM--no Kidney Ca-- brother Colon Cancer:no Breast Cancer-- Mother, Sister  Social History: Married, 3 children all in [place], 7 Gkids  related to Mr and [name] , they are my patients as well Never Smoked Alcohol use- socially  Illicit Drug Use - no Diet-- healthy most of the time  Patient does not get regular exercise 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 Uses albuterol rarely, Zoloft is keeping her symptoms well-controlled. Denies chest pain, shortness of breath or lower extremity edema. No nausea, vomiting, diarrhea. BP is low, on further questioning she feels fatigued most days, occasionally feels dizzy when she stands up. Denies any vaginal bleeding or vaginal discharge    Objective:   Physical Exam  General -- alert, well-developed   Neck --no thyromegaly , normal carotid pulse , no supraclavicular mass Lungs -- normal respiratory effort, no intercostal retractions, no accessory muscle use, and normal breath sounds.   Heart-- normal rate, regular rhythm, no murmur, and no gallop.   Abdomen--soft, non-tender, no distention, no masses, no HSM, no guarding, and no rigidity.   Extremities-- no pretibial edema bilaterally GU--external examination normal, vagina without atrophy, cervix normal to  inspection, bimanual exam without mass or tenderness. Neurologic-- alert & oriented X3 and strength normal in all extremities. Psych-- Cognition and judgment appear intact. Alert and cooperative with normal attention span and concentration.  not anxious appearing and not depressed appearing.      Assessment & Plan:  In addition to her complete physical exam: We adjust to her CHF medication, check a INR, and did a dermatology referral

## 2012-08-17 NOTE — Assessment & Plan Note (Signed)
Symptoms we'll control, BP has been consistently low, she has fatigue and occasional orthostatic dizziness Plan: Decrease Carvedilol from 12.5 bid to 6.25 twice a day

## 2012-08-17 NOTE — Assessment & Plan Note (Addendum)
Td 03 and today Flu shot today pneumonia 08 zostavax discussed, will provide at next visit   Cscope 2003, and 11-2011, next 10 years (Dr Arlyce Dice) PAP (-) 8--2009 and 01-2010, no h/o abnormal PAPsre-done today DEXA 2006, osteopenia  DEXA 06-2008: normal

## 2012-08-17 NOTE — Assessment & Plan Note (Addendum)
On Coumadin, current dose is: 5 mg daily, 7 mg Wednesdays. INR today 2.8, no change, 4 weeks

## 2012-08-17 NOTE — Assessment & Plan Note (Addendum)
Left breast cancer, DX 1982, had left  supra-clavicular node involvement, status post extensive XRT and chemotherapy Right breast cancer DX 1988 Status post bilateral mastectomies Released by oncology in 2008, was recommended yearly labs , she's not doing mammograms, had bilateral breast reconstruction. At this point she seems to be doing well, a CT of the abdomen and pelvis 2 months ago showed no chronic problems A chest x-ray 10/2011 was stable. Plan: Continue with self chest exam.

## 2012-08-17 NOTE — Patient Instructions (Addendum)
Take a lower dose of carvedilol, keep an eye on your blood pressure. Same Coumadin, recheck in 4 weeks Next office visit in 6 months

## 2012-08-17 NOTE — Assessment & Plan Note (Signed)
Also reports a skin lesion at the dorsum of the left hand, exam is confirmatory, has a three-quarter centimeter round area of slightly elevated and scaly skin. Given  Chronicity (several weeks) and lack of itching, I think she needs to be seen by dermatology. Referral will be arrange

## 2012-08-18 LAB — TSH: TSH: 1.03 u[IU]/mL (ref 0.35–5.50)

## 2012-08-24 ENCOUNTER — Encounter: Payer: Self-pay | Admitting: *Deleted

## 2012-09-09 ENCOUNTER — Telehealth: Payer: Self-pay | Admitting: Gastroenterology

## 2012-09-09 MED ORDER — CIPROFLOXACIN HCL 500 MG PO TABS: 500.0000 mg | ORAL_TABLET | Freq: Two times a day (BID) | ORAL | Status: DC

## 2012-09-09 MED ORDER — METRONIDAZOLE 250 MG PO TABS: 250.0000 mg | ORAL_TABLET | Freq: Three times a day (TID) | ORAL | Status: AC

## 2012-09-09 NOTE — Telephone Encounter (Signed)
Pt states that her diverticulitis is acting up, reports she is having pain across her stomach in into her back. States she is nauseated but does not have a fever. Pt started having problems on Sunday but each day she has gotten worse. Pt is requesting medication be sent to her pharmacy. Dr. Arlyce Dice please advise.

## 2012-09-09 NOTE — Telephone Encounter (Signed)
If this is her typical pain from diverticulitis begin Cipro 500 mg twice a day and Flagyl 250 mg 3 times a day. She should call back if not improved in the next 3-4 days. She needs a followup office visit in 2-3 weeks provided that she is improving.

## 2012-09-09 NOTE — Telephone Encounter (Signed)
Pt aware, she will call back for F/U OV. Scripts sent to the pharmacy.

## 2012-09-13 ENCOUNTER — Telehealth: Payer: Self-pay | Admitting: *Deleted

## 2012-09-13 ENCOUNTER — Other Ambulatory Visit: Payer: Self-pay | Admitting: Cardiology

## 2012-09-13 DIAGNOSIS — I359 Nonrheumatic aortic valve disorder, unspecified: Secondary | ICD-10-CM | POA: Insufficient documentation

## 2012-09-13 DIAGNOSIS — E669 Obesity, unspecified: Secondary | ICD-10-CM

## 2012-09-13 DIAGNOSIS — Z853 Personal history of malignant neoplasm of breast: Secondary | ICD-10-CM

## 2012-09-13 DIAGNOSIS — G4700 Insomnia, unspecified: Secondary | ICD-10-CM

## 2012-09-13 DIAGNOSIS — K5730 Diverticulosis of large intestine without perforation or abscess without bleeding: Secondary | ICD-10-CM

## 2012-09-13 DIAGNOSIS — I447 Left bundle-branch block, unspecified: Secondary | ICD-10-CM | POA: Insufficient documentation

## 2012-09-13 DIAGNOSIS — Z7901 Long term (current) use of anticoagulants: Secondary | ICD-10-CM | POA: Insufficient documentation

## 2012-09-13 DIAGNOSIS — Z95810 Presence of automatic (implantable) cardiac defibrillator: Secondary | ICD-10-CM

## 2012-09-13 DIAGNOSIS — I472 Ventricular tachycardia: Secondary | ICD-10-CM

## 2012-09-13 DIAGNOSIS — I5022 Chronic systolic (congestive) heart failure: Secondary | ICD-10-CM

## 2012-09-13 DIAGNOSIS — F329 Major depressive disorder, single episode, unspecified: Secondary | ICD-10-CM

## 2012-09-13 DIAGNOSIS — I428 Other cardiomyopathies: Secondary | ICD-10-CM | POA: Insufficient documentation

## 2012-09-13 NOTE — Telephone Encounter (Signed)
Pt called requesting [place] to send her most recent labs to Dr. Sabino Donovan office. Labs have been faxed.

## 2012-09-13 NOTE — Progress Notes (Signed)
Patient ID: [name], female   DOB: [birth year], 61 y.o.   MRN: [id number]   [name]  Date of visit:  09/13/2012 DOB:  1951-06-07    Age:  61 yrs. Medical record number:  [id number]     Account number:  [id number] Primary Care Provider: Willow Ora ____________________________ CURRENT DIAGNOSES  1. Congestive Heart Failure  2. Cardiomyopathy Idiopathic  3. Long-term (current) Use Of Anticoagulants  4. Aortic Valve Disorder  5. AICD in situ  6. Personal History Of Malignant Neoplasm Of Breast  7. Obesity(BMI30-40)  8. Cardiomegaly  9. Chest pain, other ____________________________ ALLERGIES  Atacand, Dizziness  Spironolactone, Weakness ____________________________ MEDICATIONS  1. warfarin 2 mg tablet, Take as directed  2. Zoloft 25 mg tablet, QHS  3. warfarin 5 mg tablet, Take as directed  4. furosemide 40 mg tablet, 1 p.o. daily  5. Klor-Con 10 10 mEq tablet extended release, B.I.D.  6. carvedilol 6.25 mg tablet, BID  7. metronidazole 250 mg tablet, TID  8. zolpidem 10 mg tablet, 1/2 qhs  9. ciprofloxacin 500 mg tablet, BID ____________________________ CHIEF COMPLAINTS  Chest pressure dyspnea with exertion ____________________________ HISTORY OF PRESENT ILLNESS  The patient returns for evaluation of dyspnea, chest tightness, fatigue and worsening malaise. Since she was previously here she has complained of not feeling well and her primary care physician reduced her carvedilol 6.25 mg twice daily. Over the past week or so she has noticed some exertional heaviness that has been present more or less most of the time as well as exertional dyspnea as well as just not feeling well. She has complained of a cough. She states that the cough is worse in the fall and that she has actually seen somebody for this. She is currently being treated for diverticulitis with metronidazole and Cipro. Because of the complaint she was worked in early today. I have not seen her since she saw  Dr. Graciela Husbands and he noted that she might be a candidate for spironolactone and also there was mention in his note that her ejection fraction that it may have normalized previously but then deteriorated. She does not have PND or orthopnea but does have dyspnea with only moderate activity. She complains of an occasional amount of edema. She has had no bleeding complications from warfarin.  ____________________________ PAST HISTORY  Past Medical Illnesses:  breast cancer 1982, 1986 tx with surgery, radiation, and chemo, obesity, nephrolithiasis, lumbar disc disease;  Cardiovascular Illnesses:  cardiomyopathy(idiopathic), conduction disorder-LBBB, arrhythmia-PVCs;  Surgical Procedures:  biil breast Implants, breast mastectomy rt, breast mastectomy left, hysterectomy-total, repair left thumb laceration;  Cardiology Procedures-Invasive:  Guidant Vitality implant 03/24/08 Dr. Graciela Husbands, cardiac cath (left) July 2009;  Cardiology Procedures-Noninvasive:  echocardiogram April 2010, event monitor April 2010, echocardiogram March 2013;  Cardiac Cath Results:  no significant disease, LAD-PA fistula;  LVEF of 25% documented via echocardiogram on 01/28/2012 ____________________________ CARDIO-PULMONARY TEST DATES EKG Date:  09/13/2012;   Cardiac Cath Date:  05/25/2008;  Holter/Event Monitor Date: 03/14/2009;  Echocardiography Date: 01/28/2012;  Chest Xray Date: 03/14/2009;   ____________________________ REVIEW OF SYSTEMS General:  malaise and fatigue  Integumentary:  no rashes or new skin lesions.  Eyes:  wears eye glasses/contact lenses, denies diplopia, history of glaucoma or visual field defects.Respiratory:  Cough, dyspnea with exertion  Cardiovascular:  please review HPI  Abdominal:  recent abdominal pain and diagnosis of diverticulitis  Genitourinary-Female:  Some urinary frequency Musculoskeletal:  denies arthritis, venous insufficiency, or muscle weakness.Neurological:  denies headaches, stroke, or  TIA  ____________________________ PHYSICAL EXAMINATION VITAL SIGNS  Blood Pressure:  104/60 Sitting, Right arm, regular cuff  , 100/58 Standing, Right arm and regular cuff   Pulse:  82/min. Weight:  191.00 lbs. Height:  64"BMI: 33  Constitutional:  pleasant white female, in no acute distress, mildly obese Skin:  warm and dry to touch, no apparent skin lesions, or masses noted. Head:  normocephalic, normal hair pattern, no masses or tenderness Eyes:  EOMS Intact, PERRLA, C and S clear, Funduscopic exam not done. ENT:  ears, nose and throat reveal no gross abnormalities.  Dentition good. Neck:  supple, no masses, thyromegaly, JVD. Carotid pulses are full and equal bilaterally without bruits. Chest:  normal symmetry, clear to auscultation and percussion., healed ICD incision in the left pectoral area Cardiac:  regular rhythm, normal S1 and S2, no S3 or S4, grade 2/6 systolic murmur heard best at the base Abdomen:  abdomen soft,non-tender, no masses, no hepatospenomegaly, or aneurysm noted Peripheral Pulses:  the femoral,dorsalis pedis, and posterior tibial pulses are full and equal bilaterally with no bruits auscultated. Extremities & Back:  1+ edema Neurological:  no gross motor or sensory deficits noted, affect appropriate, oriented x3. ____________________________ MOST RECENT LIPID PANEL 08/17/12  CHOL TOTL 195 mg/dl, HDL 50 mg/dl, TRIGLYCER 92 mg/dl and CHOL/HDL 4 (Calc) ____________________________ IMPRESSIONS/PLAN  1. Cardiomyopathy with EF of 25-30% that is been idiopathic in the past. One would wonder whether she has had worsening of her LV function as she symptomatically is worse. She complains of vague chest pressure but has had 2 previously normal catheterizations, the last being in 2009. Recent lab work was reviewed. She really has significant malaise and fatigue and her Corag was reduced recently. She is currently having a significant cough.  I reviewed Dr. Odessa Fleming note and  spoke with him on the phone. I would like him to review her records and it may be that she may need to be reconsidered for cardiac resynchronization therapy with a left bundle branch block and her symptoms. I would like her to have her repeat echocardiogram and a BNP. Her 12 EKG shows sinus rhythm with a left bundle branch block pattern and a wide QRS complex. Return visit in one week. ____________________________ TODAYS ORDERS  1. 2D, color flow, doppler: First Available  2. BNP: Today  3. Return Visit: 1 week  4. 12 Lead EKG: Today                       ____________________________ Cardiology Physician:  Darden Palmer MD [facility]

## 2012-09-15 ENCOUNTER — Encounter: Payer: Self-pay | Admitting: Internal Medicine

## 2012-09-21 ENCOUNTER — Other Ambulatory Visit: Payer: Self-pay | Admitting: Cardiology

## 2012-09-21 DIAGNOSIS — I428 Other cardiomyopathies: Secondary | ICD-10-CM

## 2012-09-21 NOTE — Progress Notes (Signed)
[name]  Date of visit:  09/21/2012 DOB:  05-25-1951    Age:  61 yrs. Medical record number:  [id number]     Account number:  [id number] Primary Care Provider: Willow Ora ____________________________ CURRENT DIAGNOSES  1. Congestive Heart Failure  2. Cardiomyopathy Idiopathic  3. Aortic Valve Disorder  4. Long-term (current) Use Of Anticoagulants  5. AICD in situ  6. Obesity(BMI30-40)  7. Personal History Of Malignant Neoplasm Of Breast  8. Cardiomegaly  9. Dyspnea ____________________________ ALLERGIES  Atacand, Dizziness  Spironolactone, Weakness ____________________________ MEDICATIONS  1. warfarin 2 mg tablet, Take as directed  2. Zoloft 25 mg tablet, QHS  3. warfarin 5 mg tablet, Take as directed  4. Klor-Con 10 10 mEq tablet extended release, B.I.D.  5. carvedilol 6.25 mg tablet, BID  6. zolpidem 10 mg tablet, 1/2 qhs  7. furosemide 40 mg tablet, BID  8. lisinopril 10 mg tablet, 1 p.o. daily ____________________________ CHIEF COMPLAINTS  Followup of Aortic Valve Disorder  Followup of Cardiomyopathy Idiopathic ____________________________ HISTORY OF PRESENT ILLNESS  Patient seen for cardiac followup. Her BNP level was elevated and she continues to complain of significant dyspnea with exertion. She reported having to stop four times when walking to the hospital to visit a relative. She also may have some mild PND. Her echocardiogram showed moderate aortic and mitral regurgitation, marked left atrial enlargement, and an EF of 25% some evidence of ventricular dyssynchrony. Her cough did not improve being off of lisinopril.  ____________________________ PAST HISTORY  Past Medical Illnesses:  breast cancer 1982, 1986 tx with surgery, radiation, and chemo, obesity, nephrolithiasis, lumbar disc disease;  Cardiovascular Illnesses:  cardiomyopathy(idiopathic), conduction disorder-LBBB, arrhythmia-PVCs;  Surgical Procedures:  biil breast Implants, breast mastectomy rt, breast  mastectomy left, hysterectomy-total, repair left thumb laceration;  Cardiology Procedures-Invasive:  Guidant Vitality implant 03/24/08 Dr. Graciela Husbands, cardiac cath (left) July 2009;  Cardiology Procedures-Noninvasive:  echocardiogram April 2010, event monitor April 2010, echocardiogram March 2013, echocardiogram October 2013;  Cardiac Cath Results:  no significant disease, LAD-PA fistula;  LVEF of 25% documented via echocardiogram on 01/28/2012 ____________________________ CARDIO-PULMONARY TEST DATES EKG Date:  09/13/2012;   Cardiac Cath Date:  05/25/2008;  Holter/Event Monitor Date: 03/14/2009;  Echocardiography Date: 09/15/2012;  Chest Xray Date: 03/14/2009;   ____________________________ SOCIAL HISTORY Alcohol Use:  socially;  Smoking:  never smoked;  Diet:  regular diet without modifications;  Lifestyle:  married and 3 children;  Exercise:  exercises regularly;  Occupation:  Interior and spatial designer at [employer];  Residence:  lives with husband;   ____________________________ REVIEW OF SYSTEMS General:  obesity, malaise and fatigue  Eyes:  wears eye glasses/contact lenses, denies diplopia, history of glaucoma or visual field defects.  Respiratory:  dyspnea with exertion  Cardiovascular:  please review HPI  Abdominal:  denies dyspepsia, GI bleeding, constipation, or diarrhea  Genitourinary-Female:  no dysuria, urgency, frequency, UTIs, or stress incontinence ____________________________ PHYSICAL EXAMINATION VITAL SIGNS  Blood Pressure:  104/54 Sitting, Left arm, regular cuff  , 100/50 Standing, Left arm and regular cuff   Pulse:  84/min. Weight:  192.00 lbs. Height:  64"BMI: 33  Constitutional:  pleasant white female, in no acute distress, mildly obese Skin:  warm and dry to touch, no apparent skin lesions, or masses noted. ENT:  ears, nose and throat reveal no gross abnormalities.  Dentition good. Neck:  supple, no masses, thyromegaly, JVD. Carotid pulses are full and equal bilaterally without bruits. Chest:   normal symmetry, clear to auscultation and percussion., healed ICD incision in the left  pectoral area Cardiac:  regular rhythm, normal S1 and S2, no S3 or S4, grade 2/6 systolic murmur heard best at the base Peripheral Pulses:  the femoral,dorsalis pedis, and posterior tibial pulses are full and equal bilaterally with no bruits auscultated. Extremities & Back:  1+ edema Neurological:  no gross motor or sensory deficits noted, affect appropriate, oriented x3. ____________________________ MOST RECENT LIPID PANEL 08/17/12  CHOL TOTL 195 mg/dl, HDL 50 mg/dl, TRIGLYCER 92 mg/dl and CHOL/HDL 4 (Calc) ____________________________ IMPRESSIONS/PLAN  1. Idiopathic cardiomyopathy with ejection fraction of 25% 2. Left bundle branch block 3. Moderate aortic and mitral regurgitation 4. Functioning implantable defibrillator but with nonfunctioning LV lead  Recommendations:  Return visit in one month. I've asked her to see Dr. Sherryl Manges to consider whether she needs to have an upgrade to a biventricular defibrillator. In the meantime I asked her to increase her furosemide to 40 mg twice a day and to resume her lisinopril. We may try a lower dose of spironolactone again in the future although she did not tolerate this before. ____________________________ TODAYS ORDERS  1. Return Visit: 1 month                       ____________________________ Cardiology Physician:  Darden Palmer MD [facility]

## 2012-10-01 ENCOUNTER — Encounter: Payer: Self-pay | Admitting: Internal Medicine

## 2012-10-01 ENCOUNTER — Encounter: Payer: Self-pay | Admitting: *Deleted

## 2012-10-01 ENCOUNTER — Ambulatory Visit (INDEPENDENT_AMBULATORY_CARE_PROVIDER_SITE_OTHER): Payer: Managed Care, Other (non HMO) | Admitting: Internal Medicine

## 2012-10-01 ENCOUNTER — Encounter: Payer: Managed Care, Other (non HMO) | Admitting: Internal Medicine

## 2012-10-01 VITALS — BP 99/50 | HR 74 | Resp 18 | Ht 64.0 in | Wt 181.4 lb

## 2012-10-01 DIAGNOSIS — I472 Ventricular tachycardia: Secondary | ICD-10-CM

## 2012-10-01 DIAGNOSIS — I5022 Chronic systolic (congestive) heart failure: Secondary | ICD-10-CM

## 2012-10-01 DIAGNOSIS — I428 Other cardiomyopathies: Secondary | ICD-10-CM

## 2012-10-01 DIAGNOSIS — Z95810 Presence of automatic (implantable) cardiac defibrillator: Secondary | ICD-10-CM

## 2012-10-01 LAB — ICD DEVICE OBSERVATION
AL IMPEDENCE ICD: 458 Ohm
BAMS-0001: 170 {beats}/min
BAMS-0002: 8 ms
BATTERY VOLTAGE: 2.59 V
DEVICE MODEL ICD: 144036
HV IMPEDENCE: 47 Ohm
TZAT-0001FASTVT: 1
TZAT-0004FASTVT: 4
TZAT-0012FASTVT: 200 ms
TZAT-0013FASTVT: 3
TZAT-0019FASTVT: 7.5 V
TZAT-0020FASTVT: 1 ms
TZAT-0020FASTVT: 1 ms
TZON-0003FASTVT: 333 ms
TZON-0004FASTVT: 2.5
TZST-0001FASTVT: 3
TZST-0001FASTVT: 5
TZST-0001FASTVT: 6
TZST-0003FASTVT: 17 J
TZST-0003FASTVT: 31 J
TZST-0003FASTVT: 31 J
VENTRICULAR PACING ICD: 0 pct

## 2012-10-01 NOTE — Assessment & Plan Note (Signed)
The patient's device was interrogated.  The information was reviewed. No changes were made in the programming.    

## 2012-10-01 NOTE — Patient Instructions (Addendum)
Your physician wants you to follow-up in: 1 year with Dr Klein.  You will receive a reminder letter in the mail two months in advance. If you don't receive a letter, please call our office to schedule the follow-up appointment.  

## 2012-10-01 NOTE — Progress Notes (Signed)
Patient Care Team: Wanda Plump, MD as PCP - General   HPI  [name] is a 61 y.o. female Seen after a hiatus of the time. 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 Donnie Aho a few weeks ago with complaints of progressive chest pain, dyspnea and malaise. There was some concern as to whether she might be a candidate for CRT. There was a comment in the old notes suggesting that there have been intercurrent normalization of LV function, although I can't find records that prompted me to say that, so I assume that I error about that. She does have left bundle branch block  She was originally implanted as part of the MADIT CRT. She underwent generator replacement 2009 with capping of the LV lead. It's impedance was greater than 4000.  Review of the operative report demonstrated that she had 3 lateral/anterolateral veins of which one was chosen  Past Medical History  Diagnosis Date  . Breast CA     twice first on L breast 1982 w/ chest wall involvment, then a second breast ca on the R in 1987  . CHF (congestive heart failure)     due to Adriamycin,s/p ICD-Cardiomyopathy  . Depression   . Insomnia   . Headache   . Diverticulitis   . Osteopenia   . Renal calculus   . Headache   . Menopause     since age 30s    Past Surgical History  Procedure Date  . Mastectomy     B  . Oophorectomy     B, per genticists advise at [facility]  . Cardiac defibrillator placement     AICD Replaced-5/09    Current Outpatient Prescriptions  Medication Sig Dispense Refill  . albuterol (VENTOLIN HFA) 108 (90 BASE) MCG/ACT inhaler Inhale 2 puffs into the lungs every 6 (six) hours as needed.       . carvedilol (COREG) 6.25 MG tablet Take 1 tablet (6.25 mg total) by mouth 2 (two) times daily with a meal.  60 tablet  12  . fluticasone (FLONASE) 50 MCG/ACT nasal spray Place 2 sprays into the nose daily as needed.       . furosemide (LASIX) 40 MG tablet TAKE ONE  TABLET BY MOUTH DAILY  90 tablet  3  . lisinopril (PRINIVIL,ZESTRIL) 10 MG tablet Take 10 mg by mouth daily.        . multivitamin (THERAGRAN) per tablet Take 1 tablet by mouth daily.        . potassium chloride (KLOR-CON) 10 MEQ CR tablet Take 10 mEq by mouth 2 (two) times daily.        . sertraline (ZOLOFT) 25 MG tablet TAKE ONE TABLET BY MOUTH DAILY  90 tablet  0  . warfarin (COUMADIN) 5 MG tablet       . ciprofloxacin (CIPRO) 500 MG tablet Take 1 tablet (500 mg total) by mouth 2 (two) times daily.  20 tablet  0  . zolpidem (AMBIEN) 10 MG tablet Take 5 mg by mouth at bedtime as needed.        Allergies  Allergen Reactions  . Atacand (Candesartan)     Fatigue   . Cephalexin     REACTION: itching, hives  . Spironolactone     Fatigue     Review of Systems negative except from HPI and PMH  Physical Exam BP 99/50  Pulse 74  Resp 18  Ht 5\' 4"  (1.626 m)  Wt  181 lb 6.4 oz (82.283 kg)  BMI 31.14 kg/m2  SpO2 97% Well developed and well nourished in no acute distress HENT normal E scleral and icterus clear Neck Supple JVP flat; carotids brisk and full Clear to ausculation Device pocket well healed; without hematoma or erythema  suerficial veins on L chest Regular rate and rhythm, no murmurs gallops or rub Soft with active bowel sounds No clubbing cyanosis Trace Edema Alert and oriented, grossly normal motor and sensory function Skin Warm and Dry  Sinus rhythm Intervals 17/17/47 Access left -75 Left bundle branch block  Assessment and  Plan

## 2012-10-01 NOTE — Assessment & Plan Note (Signed)
She is feeling better with the diuretics. We will check a metabolic profile today.

## 2012-10-01 NOTE — Assessment & Plan Note (Signed)
She has nonischemic cardiomyopathy with progressive congestive heart failure. Her MR may be secondary. She is brought left bundle branch block. I have reviewed the situation with Dr. Ladona Ridgel. The recommendation would be that we undertake a venogram to assess whether the vein is open; I am not sanguine based on her superficial veins. She would then go to the hybrid OR with Dr. Ladona Ridgel for possible lead extraction and lead implantation

## 2012-10-01 NOTE — Assessment & Plan Note (Signed)
No intercurrent VT 

## 2012-10-02 ENCOUNTER — Other Ambulatory Visit: Payer: Self-pay | Admitting: Internal Medicine

## 2012-10-04 ENCOUNTER — Other Ambulatory Visit: Payer: Self-pay | Admitting: Internal Medicine

## 2012-10-04 NOTE — Telephone Encounter (Signed)
Refill done.  

## 2012-10-06 ENCOUNTER — Encounter (HOSPITAL_COMMUNITY): Payer: Self-pay

## 2012-10-07 ENCOUNTER — Ambulatory Visit (INDEPENDENT_AMBULATORY_CARE_PROVIDER_SITE_OTHER): Payer: Managed Care, Other (non HMO) | Admitting: Internal Medicine

## 2012-10-07 ENCOUNTER — Encounter: Payer: Self-pay | Admitting: Internal Medicine

## 2012-10-07 VITALS — BP 102/64 | HR 73 | Temp 98.0°F | Wt 186.0 lb

## 2012-10-07 DIAGNOSIS — R591 Generalized enlarged lymph nodes: Secondary | ICD-10-CM | POA: Insufficient documentation

## 2012-10-07 DIAGNOSIS — R599 Enlarged lymph nodes, unspecified: Secondary | ICD-10-CM

## 2012-10-07 DIAGNOSIS — L989 Disorder of the skin and subcutaneous tissue, unspecified: Secondary | ICD-10-CM

## 2012-10-07 NOTE — Progress Notes (Signed)
  Subjective:    Patient ID: [name], female    DOB: August 18, 1951, 61 y.o.   MRN: [id number]  HPI Acute visit 2 days ago noted a mass at the left posterior neck, does not know for how long has it been there. She is concerned because she had radiation therapy at the left chest for breast cancer.  Past Medical History: Breast cancer, twice (first on the L breast 1982 w/ chest wall involvment, then a second breast Ca on the R in 1987) Congestive heart failure, due to Adriamycin, s/p ICD Depression Insomnia Headache Diverticulitis, hx of Osteopenia RENAL CALCULUS, HX OF (ICD-V13.01) HEADACHE Menopause since her 30s   Past Surgical History: Mastectomy, B Oophorectomy B,  per a geneticists advise at [facility] Defibrillator-AICD Replaced 5/09  Family History: MI--no DM--no Kidney Ca-- brother Colon Cancer:no Breast Cancer-- Mother, Sister  Social History: Married, 3 children all in [place], 7 Gkids   related to Mr and [name] , they are my patients as well Never Smoked Alcohol use- socially   Illicit Drug Use - no Diet-- healthy most of the time   Patient does not get regular exercise 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 No rash. Also, was seen in September for her physical, was refer to dermatology, areas of concern were treated.    Objective:   Physical Exam  Constitutional: She appears well-developed and well-nourished. No distress.  HENT:  Head: Normocephalic and atraumatic.    Right Ear: External ear normal.  Left Ear: External ear normal.  Nose: Nose normal.  Mouth/Throat: Oropharynx is clear and moist.       Scalp without lesions  Neck:       Supraclavicular area free of masses  Skin: She is not diaphoretic.       Assessment & Plan:

## 2012-10-07 NOTE — Assessment & Plan Note (Addendum)
Neck mass consistent with a LAD, benign features, recommend observation. If not improving, may need to be eval by ENT rec to self inspect and palpate the area.

## 2012-10-07 NOTE — Assessment & Plan Note (Signed)
Saw dermatology, lesions were treated, will go back in a few months for a checkup

## 2012-10-07 NOTE — Patient Instructions (Addendum)
Please keep an eye on that area, if is not back to normal in around 4-6 weeks let us know. Call anytime if the area gets bigger, harder or hurt.

## 2012-10-15 ENCOUNTER — Other Ambulatory Visit (INDEPENDENT_AMBULATORY_CARE_PROVIDER_SITE_OTHER): Payer: Managed Care, Other (non HMO)

## 2012-10-15 DIAGNOSIS — I428 Other cardiomyopathies: Secondary | ICD-10-CM

## 2012-10-15 DIAGNOSIS — I472 Ventricular tachycardia, unspecified: Secondary | ICD-10-CM

## 2012-10-15 DIAGNOSIS — I5022 Chronic systolic (congestive) heart failure: Secondary | ICD-10-CM

## 2012-10-15 DIAGNOSIS — Z95810 Presence of automatic (implantable) cardiac defibrillator: Secondary | ICD-10-CM

## 2012-10-15 LAB — CBC WITH DIFFERENTIAL/PLATELET
Eosinophils Absolute: 0.1 10*3/uL (ref 0.0–0.7)
Eosinophils Relative: 2 % (ref 0.0–5.0)
Lymphocytes Relative: 35.8 % (ref 12.0–46.0)
MCV: 92.4 fl (ref 78.0–100.0)
Monocytes Absolute: 1 10*3/uL (ref 0.1–1.0)
Neutrophils Relative %: 47.4 % (ref 43.0–77.0)
Platelets: 223 10*3/uL (ref 150.0–400.0)
RBC: 4.14 Mil/uL (ref 3.87–5.11)
WBC: 7 10*3/uL (ref 4.5–10.5)

## 2012-10-15 LAB — PROTIME-INR: Prothrombin Time: 25.1 s — ABNORMAL HIGH (ref 10.2–12.4)

## 2012-10-15 LAB — BASIC METABOLIC PANEL
Chloride: 101 mEq/L (ref 96–112)
GFR: 75.82 mL/min (ref >60.00)
Glucose, Bld: 100 mg/dL — ABNORMAL HIGH (ref 70–99)
Potassium: 3.5 mEq/L (ref 3.5–5.1)
Sodium: 136 mEq/L (ref 135–145)

## 2012-10-15 LAB — APTT: aPTT: 41.8 s — ABNORMAL HIGH (ref 21.7–28.8)

## 2012-10-18 ENCOUNTER — Telehealth: Payer: Self-pay | Admitting: Internal Medicine

## 2012-10-18 NOTE — Telephone Encounter (Signed)
Please Inform Patient  Thanks Labs are withnin range for wednesday

## 2012-10-18 NOTE — Telephone Encounter (Signed)
Pt.notified

## 2012-10-18 NOTE — Telephone Encounter (Signed)
F/u   Pt calling for status on coumadin hold for upcoming procedure, she can be reached at [phone] before 6pm today.

## 2012-10-18 NOTE — Telephone Encounter (Signed)
New Problem:    Patient called in wanting to know if the results of her blood work had returned yet and wanted to know how to proceed with her coumadin.  Please call back.

## 2012-10-20 ENCOUNTER — Other Ambulatory Visit: Payer: Self-pay | Admitting: Internal Medicine

## 2012-10-20 ENCOUNTER — Ambulatory Visit (HOSPITAL_COMMUNITY)
Admission: RE | Admit: 2012-10-20 | Discharge: 2012-10-20 | Disposition: A | Discharge status: Home / Self Care | Payer: Managed Care, Other (non HMO) | Source: Ambulatory Visit | Attending: Internal Medicine | Admitting: Internal Medicine

## 2012-10-20 ENCOUNTER — Encounter (HOSPITAL_COMMUNITY)
Admission: RE | Disposition: A | Discharge status: Home / Self Care | Payer: Self-pay | Source: Ambulatory Visit | Attending: Internal Medicine

## 2012-10-20 DIAGNOSIS — Z95810 Presence of automatic (implantable) cardiac defibrillator: Secondary | ICD-10-CM

## 2012-10-20 DIAGNOSIS — I472 Ventricular tachycardia: Secondary | ICD-10-CM

## 2012-10-20 DIAGNOSIS — I428 Other cardiomyopathies: Secondary | ICD-10-CM

## 2012-10-20 DIAGNOSIS — I447 Left bundle-branch block, unspecified: Secondary | ICD-10-CM | POA: Insufficient documentation

## 2012-10-20 DIAGNOSIS — I5022 Chronic systolic (congestive) heart failure: Secondary | ICD-10-CM

## 2012-10-20 HISTORY — PX: VENOGRAM: SHX5497

## 2012-10-20 SURGERY — VENOGRAM
Anesthesia: LOCAL

## 2012-10-20 MED ORDER — SODIUM CHLORIDE 0.9 % IV SOLN: INTRAVENOUS | Status: DC

## 2012-10-20 MED ORDER — ZOLPIDEM TARTRATE 10 MG PO TABS: 5.0000 mg | ORAL_TABLET | Freq: Every evening | ORAL | Status: DC | PRN

## 2012-10-20 NOTE — Telephone Encounter (Signed)
Refill done.  

## 2012-10-20 NOTE — Telephone Encounter (Signed)
I think she's taking it.  Please call the patient, if she needs a refill, go ahead and do #90 and 1 refill

## 2012-10-20 NOTE — H&P (View-Only) (Signed)
Patient Care Team: Jose E Paz, MD as PCP - General   HPI  [name] is a 61 y.o. female Seen after a hiatus of the time. 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 Tilley a few weeks ago with complaints of progressive chest pain, dyspnea and malaise. There was some concern as to whether she might be a candidate for CRT. There was a comment in the old notes suggesting that there have been intercurrent normalization of LV function, although I can't find records that prompted me to say that, so I assume that I error about that. She does have left bundle branch block  She was originally implanted as part of the MADIT CRT. She underwent generator replacement 2009 with capping of the LV lead. It's impedance was greater than 4000.  Review of the operative report demonstrated that she had 3 lateral/anterolateral veins of which one was chosen  Past Medical History  Diagnosis Date  . Breast CA     twice first on L breast 1982 w/ chest wall involvment, then a second breast ca on the R in 1987  . CHF (congestive heart failure)     due to Adriamycin,s/p ICD-Cardiomyopathy  . Depression   . Insomnia   . Headache   . Diverticulitis   . Osteopenia   . Renal calculus   . Headache   . Menopause     since age 28s    Past Surgical History  Procedure Date  . Mastectomy     B  . Oophorectomy     B, per genticists advise at [facility]  . Cardiac defibrillator placement     AICD Replaced-5/09    Current Outpatient Prescriptions  Medication Sig Dispense Refill  . albuterol (VENTOLIN HFA) 108 (90 BASE) MCG/ACT inhaler Inhale 2 puffs into the lungs every 6 (six) hours as needed.       . carvedilol (COREG) 6.25 MG tablet Take 1 tablet (6.25 mg total) by mouth 2 (two) times daily with a meal.  60 tablet  12  . fluticasone (FLONASE) 50 MCG/ACT nasal spray Place 2 sprays into the nose daily as needed.       . furosemide (LASIX) 40 MG tablet TAKE ONE  TABLET BY MOUTH DAILY  90 tablet  3  . lisinopril (PRINIVIL,ZESTRIL) 10 MG tablet Take 10 mg by mouth daily.        . multivitamin (THERAGRAN) per tablet Take 1 tablet by mouth daily.        . potassium chloride (KLOR-CON) 10 MEQ CR tablet Take 10 mEq by mouth 2 (two) times daily.        . sertraline (ZOLOFT) 25 MG tablet TAKE ONE TABLET BY MOUTH DAILY  90 tablet  0  . warfarin (COUMADIN) 5 MG tablet       . ciprofloxacin (CIPRO) 500 MG tablet Take 1 tablet (500 mg total) by mouth 2 (two) times daily.  20 tablet  0  . zolpidem (AMBIEN) 10 MG tablet Take 5 mg by mouth at bedtime as needed.        Allergies  Allergen Reactions  . Atacand (Candesartan)     Fatigue   . Cephalexin     REACTION: itching, hives  . Spironolactone     Fatigue     Review of Systems negative except from HPI and PMH  Physical Exam BP 99/50  Pulse 74  Resp 18  Ht 5' 4" (1.626 m)  Wt   181 lb 6.4 oz (82.283 kg)  BMI 31.14 kg/m2  SpO2 97% Well developed and well nourished in no acute distress HENT normal E scleral and icterus clear Neck Supple JVP flat; carotids brisk and full Clear to ausculation Device pocket well healed; without hematoma or erythema  suerficial veins on L chest Regular rate and rhythm, no murmurs gallops or rub Soft with active bowel sounds No clubbing cyanosis Trace Edema Alert and oriented, grossly normal motor and sensory function Skin Warm and Dry  Sinus rhythm Intervals 17/17/47 Access left -75 Left bundle branch block  Assessment and  Plan  

## 2012-10-20 NOTE — CV Procedure (Signed)
Venogram >> patency of LSCV   Plan >> Dr Jomarie Longs to revise system

## 2012-10-20 NOTE — Interval H&P Note (Signed)
History and Physical Interval Note:  10/20/2012 7:14 AM  [name]  has presented today for surgery, with the diagnosis of Left bundle branch heart block  The various methods of treatment have been discussed with the patient and family. After consideration of risks, benefits and other options for treatment, the patient has consented to  Procedure(s) (LRB) with comments: VENOGRAM (N/A) as a surgical intervention .  The patient's history has been reviewed, patient examined, no change in status, stable for surgery.  I have reviewed the patient's chart and labs.  Questions were answered to the patient's satisfaction.     Sherryl Manges

## 2012-10-20 NOTE — Telephone Encounter (Signed)
refill zolpidem 10mg  tab last fill 10.9.13, last ov 11.14.13

## 2012-10-20 NOTE — Telephone Encounter (Signed)
Not on pt's current med list. OK to refill? 

## 2012-11-05 ENCOUNTER — Ambulatory Visit (INDEPENDENT_AMBULATORY_CARE_PROVIDER_SITE_OTHER): Payer: Managed Care, Other (non HMO) | Admitting: Internal Medicine

## 2012-11-05 ENCOUNTER — Encounter: Payer: Self-pay | Admitting: Internal Medicine

## 2012-11-05 VITALS — BP 99/66 | HR 78 | Ht 64.0 in | Wt 188.0 lb

## 2012-11-05 DIAGNOSIS — I5022 Chronic systolic (congestive) heart failure: Secondary | ICD-10-CM

## 2012-11-05 DIAGNOSIS — I428 Other cardiomyopathies: Secondary | ICD-10-CM

## 2012-11-05 DIAGNOSIS — Z95810 Presence of automatic (implantable) cardiac defibrillator: Secondary | ICD-10-CM

## 2012-11-05 LAB — ICD DEVICE OBSERVATION
BAMS-0002: 8 ms
BAMS-0003: 70 {beats}/min
DEVICE MODEL ICD: 144036
PACEART VT: 0
RV LEAD IMPEDENCE ICD: 468 Ohm
TZAT-0004FASTVT: 4
TZAT-0012FASTVT: 200 ms
TZAT-0013FASTVT: 3
TZAT-0013FASTVT: 3
TZAT-0018FASTVT: NEGATIVE
TZAT-0018FASTVT: NEGATIVE
TZAT-0019FASTVT: 7.5 V
TZAT-0020FASTVT: 1 ms
TZAT-0020FASTVT: 1 ms
TZON-0003FASTVT: 333 ms
TZON-0004FASTVT: 2.5
TZST-0001FASTVT: 3
TZST-0001FASTVT: 6
TZST-0003FASTVT: 23 J
TZST-0003FASTVT: 31 J
VENTRICULAR PACING ICD: 0 pct
VF: 0

## 2012-11-05 NOTE — Patient Instructions (Signed)
We will call you to schedule your procedure and send you information regarding it.

## 2012-11-05 NOTE — Assessment & Plan Note (Signed)
Her device is working normally. She currently has a dual-chamber ICD in place.

## 2012-11-05 NOTE — Assessment & Plan Note (Signed)
I discussed the treatment options regarding her chronic systolic heart failure. The venography several weeks ago demonstrated that her subclavian vein was in fact patent. The risk, goals, benefits, and expectations of left ventricular lead revision have been discussed with the patient. Also the option of surgical insertion of an epicardial left ventricular lead has also been discussed. The patient would like to proceed with an endovascular approach. This be scheduled at the earliest possible could be a time in the next several weeks.

## 2012-11-05 NOTE — Progress Notes (Signed)
HPI [name] is referred today by Dr. Graciela Kirby for consideration for biventricular ICD revision. The patient is a very pleasant 61 year old woman with a history of nonischemic cardiomyopathy, chronic systolic heart failure, left bundle branch block, status post ICD implantation. She has a history of Adriamycin cardiomyopathy. The patient initially did well but has had worsening heart failure symptoms in the last year. Her left ventricular lead was found to have a very high capture threshold of over 6 V and 2 ms. Unfortunately, her heart failure symptoms have worsened. Walking across the room, she will get tired after stopping take breath. Despite this, she is still working as a Manufacturing systems engineer. Allergies  Allergen Reactions  . Atacand (Candesartan)     Fatigue   . Cephalexin     REACTION: itching, hives  . Spironolactone     Fatigue      Current Outpatient Prescriptions  Medication Sig Dispense Refill  . albuterol (VENTOLIN HFA) 108 (90 BASE) MCG/ACT inhaler Inhale 2 puffs into the lungs every 6 (six) hours as needed. For shortness of breath/wheezing      . carvedilol (COREG) 6.25 MG tablet Take 1 tablet (6.25 mg total) by mouth 2 (two) times daily with a meal.  60 tablet  12  . fluticasone (FLONASE) 50 MCG/ACT nasal spray Place 2 sprays into the nose daily as needed. For sinuses      . furosemide (LASIX) 40 MG tablet Take 40 mg by mouth 2 (two) times daily.      Marland Kitchen lisinopril (PRINIVIL,ZESTRIL) 10 MG tablet Take 10 mg by mouth daily.        . multivitamin (THERAGRAN) per tablet Take 1 tablet by mouth daily.        . potassium chloride (KLOR-CON) 10 MEQ CR tablet Take 10 mEq by mouth 2 (two) times daily.        . sertraline (ZOLOFT) 25 MG tablet TAKE ONE TABLET BY MOUTH DAILY  90 tablet  1  . warfarin (COUMADIN) 2 MG tablet Take 2 mg by mouth every 7 (seven) days. On Wednesdays only pt takes 2 mg Coumadin with 5 mg Coumadin      . warfarin (COUMADIN) 5 MG tablet Take 5 mg by mouth  daily. Takes an additional 2 mg Warfarin with 5 mg tablets on Wednesday ONLY      . zolpidem (AMBIEN) 10 MG tablet Take 0.5 tablets (5 mg total) by mouth at bedtime as needed. For sleep  90 tablet  1     Past Medical History  Diagnosis Date  . Breast CA     twice first on L breast 1982 w/ chest wall involvment, then a second breast ca on the R in 1987  . CHF (congestive heart failure)     due to Adriamycin,s/p ICD-Cardiomyopathy  . Depression   . Insomnia   . Headache   . Diverticulitis   . Osteopenia   . Renal calculus   . Headache   . Menopause     since age 26s    ROS:   All systems reviewed and negative except as noted in the HPI.   Past Surgical History  Procedure Date  . Mastectomy     B  . Oophorectomy     B, per genticists advise at [facility]  . Cardiac defibrillator placement     AICD Replaced-5/09     Family History  Problem Relation Age of Onset  . Heart attack Neg Hx   . Diabetes Neg Hx   .  Colon cancer Neg Hx   . Kidney cancer Brother   . Breast cancer Mother   . Cancer Mother     breast  . Breast cancer Sister   . Cancer Sister     breast     History   Social History  . Marital Status: Married    Spouse Name: N/A    Number of Children: N/A  . Years of Education: N/A   Occupational History  . Not on file.   Social History Main Topics  . Smoking status: Never Smoker   . Smokeless tobacco: Never Used  . Alcohol Use: Yes     Comment: socially  . Drug Use: No  . Sexually Active: Not on file   Other Topics Concern  . 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 No wheezes, no rhonchi, and no increased work of breathing. HEART:  Regular rate rhythm, no murmurs, no rubs, no clicks, split S2. Abd:  soft, positive bowel sounds, no organomegally, no rebound, no guarding Ext:  2 plus pulses, no edema, no cyanosis, no clubbing Skin:  No rashes no nodules Neuro:  CN II through XII intact, motor grossly intact  EKG sinus rhythm with left bundle branch block  DEVICE  Normal device function.  See PaceArt for details. Her left ventricular lead was not being utilized  Assess/Plan:

## 2012-11-19 ENCOUNTER — Ambulatory Visit (INDEPENDENT_AMBULATORY_CARE_PROVIDER_SITE_OTHER): Payer: Managed Care, Other (non HMO) | Admitting: *Deleted

## 2012-11-19 VITALS — BP 110/64 | HR 81

## 2012-11-19 DIAGNOSIS — Z7901 Long term (current) use of anticoagulants: Secondary | ICD-10-CM

## 2012-11-19 DIAGNOSIS — Z0000 Encounter for general adult medical examination without abnormal findings: Secondary | ICD-10-CM

## 2012-11-19 MED ORDER — WARFARIN SODIUM 2 MG PO TABS: 2.0000 mg | ORAL_TABLET | ORAL | Status: DC

## 2012-11-19 MED ORDER — WARFARIN SODIUM 5 MG PO TABS: 5.0000 mg | ORAL_TABLET | Freq: Every day | ORAL | Status: DC

## 2012-11-19 NOTE — Patient Instructions (Signed)
No change. Come back in 4 weeks.  

## 2012-11-26 ENCOUNTER — Encounter: Payer: Self-pay | Admitting: *Deleted

## 2012-11-26 ENCOUNTER — Other Ambulatory Visit: Payer: Managed Care, Other (non HMO)

## 2012-11-26 ENCOUNTER — Other Ambulatory Visit: Payer: Self-pay | Admitting: *Deleted

## 2012-11-26 DIAGNOSIS — I447 Left bundle-branch block, unspecified: Secondary | ICD-10-CM

## 2012-11-26 DIAGNOSIS — I509 Heart failure, unspecified: Secondary | ICD-10-CM

## 2012-11-28 ENCOUNTER — Emergency Department (HOSPITAL_BASED_OUTPATIENT_CLINIC_OR_DEPARTMENT_OTHER): Payer: Managed Care, Other (non HMO)

## 2012-11-28 ENCOUNTER — Emergency Department (HOSPITAL_BASED_OUTPATIENT_CLINIC_OR_DEPARTMENT_OTHER)
Admission: EM | Admit: 2012-11-28 | Discharge: 2012-11-28 | Disposition: A | Discharge status: Home / Self Care | Payer: Managed Care, Other (non HMO) | Attending: Emergency Medicine | Admitting: Emergency Medicine

## 2012-11-28 ENCOUNTER — Encounter (HOSPITAL_BASED_OUTPATIENT_CLINIC_OR_DEPARTMENT_OTHER): Payer: Self-pay | Admitting: Emergency Medicine

## 2012-11-28 DIAGNOSIS — Z8739 Personal history of other diseases of the musculoskeletal system and connective tissue: Secondary | ICD-10-CM | POA: Insufficient documentation

## 2012-11-28 DIAGNOSIS — Z853 Personal history of malignant neoplasm of breast: Secondary | ICD-10-CM | POA: Insufficient documentation

## 2012-11-28 DIAGNOSIS — N200 Calculus of kidney: Secondary | ICD-10-CM | POA: Insufficient documentation

## 2012-11-28 DIAGNOSIS — Z8742 Personal history of other diseases of the female genital tract: Secondary | ICD-10-CM | POA: Insufficient documentation

## 2012-11-28 DIAGNOSIS — J189 Pneumonia, unspecified organism: Secondary | ICD-10-CM | POA: Insufficient documentation

## 2012-11-28 DIAGNOSIS — I509 Heart failure, unspecified: Secondary | ICD-10-CM | POA: Insufficient documentation

## 2012-11-28 DIAGNOSIS — R05 Cough: Secondary | ICD-10-CM | POA: Insufficient documentation

## 2012-11-28 DIAGNOSIS — F329 Major depressive disorder, single episode, unspecified: Secondary | ICD-10-CM | POA: Insufficient documentation

## 2012-11-28 DIAGNOSIS — F3289 Other specified depressive episodes: Secondary | ICD-10-CM | POA: Insufficient documentation

## 2012-11-28 DIAGNOSIS — Z7901 Long term (current) use of anticoagulants: Secondary | ICD-10-CM | POA: Insufficient documentation

## 2012-11-28 DIAGNOSIS — R059 Cough, unspecified: Secondary | ICD-10-CM | POA: Insufficient documentation

## 2012-11-28 DIAGNOSIS — Z79899 Other long term (current) drug therapy: Secondary | ICD-10-CM | POA: Insufficient documentation

## 2012-11-28 DIAGNOSIS — Z8719 Personal history of other diseases of the digestive system: Secondary | ICD-10-CM | POA: Insufficient documentation

## 2012-11-28 MED ORDER — AZITHROMYCIN 250 MG PO TABS: 250.0000 mg | ORAL_TABLET | Freq: Every day | ORAL | Status: DC

## 2012-11-28 MED ORDER — HYDROCOD POLST-CHLORPHEN POLST 10-8 MG/5ML PO LQCR: 5.0000 mL | Freq: Two times a day (BID) | ORAL | Status: DC | PRN

## 2012-11-28 NOTE — ED Provider Notes (Signed)
History     CSN: [id number]  Arrival date & time 11/28/12  1054   First MD Initiated Contact with Patient 11/28/12 1205      Chief Complaint  Patient presents with  . Cough  . Fever    (Consider location/radiation/quality/duration/timing/severity/associated sxs/prior treatment) HPI Comments:     Patient is a 62 y.o. female presenting with cough and fever. The history is provided by the patient.  Cough This is a new problem. The current episode started 2 days ago. The problem has been gradually worsening. The cough is productive of sputum. The maximum temperature recorded prior to her arrival was 101 to 101.9 F. Associated symptoms include chills. Pertinent negatives include no chest pain, no headaches, no sore throat, no shortness of breath and no wheezing. She has tried cough syrup for the symptoms. She is not a smoker. Her past medical history does not include COPD or asthma.  Fever Primary symptoms of the febrile illness include fever and cough. Primary symptoms do not include headaches, wheezing or shortness of breath.    Past Medical History  Diagnosis Date  . Breast CA     twice first on L breast 1982 w/ chest wall involvment, then a second breast ca on the R in 1987  . CHF (congestive heart failure)     due to Adriamycin,s/p ICD-Cardiomyopathy  . Depression   . Insomnia   . Headache   . Diverticulitis   . Osteopenia   . Renal calculus   . Headache   . Menopause     since age 24s    Past Surgical History  Procedure Date  . Mastectomy     B  . Oophorectomy     B, per genticists advise at [facility]  . Cardiac defibrillator placement     AICD Replaced-5/09    Family History  Problem Relation Age of Onset  . Heart attack Neg Hx   . Diabetes Neg Hx   . Colon cancer Neg Hx   . Kidney cancer Brother   . Breast cancer Mother   . Cancer Mother     breast  . Breast cancer Sister   . Cancer Sister     breast    History  Substance Use Topics  . Smoking  status: Never Smoker   . Smokeless tobacco: Never Used  . Alcohol Use: Yes     Comment: socially    OB History    Grav Para Term Preterm Abortions TAB SAB Ect Mult Living                  Review of Systems  Constitutional: Positive for fever and chills.  HENT: Negative for sore throat.   Respiratory: Positive for cough. Negative for shortness of breath and wheezing.   Cardiovascular: Negative for chest pain and leg swelling.  Neurological: Negative for headaches.  All other systems reviewed and are negative.    Allergies  Atacand; Cephalexin; and Spironolactone  Home Medications   Current Outpatient Rx  Name  Route  Sig  Dispense  Refill  . ALBUTEROL SULFATE HFA 108 (90 BASE) MCG/ACT IN AERS   Inhalation   Inhale 2 puffs into the lungs every 6 (six) hours as needed. For shortness of breath/wheezing         . FLUTICASONE PROPIONATE 50 MCG/ACT NA SUSP   Nasal   Place 2 sprays into the nose daily as needed. For sinuses         . FUROSEMIDE 40  MG PO TABS   Oral   Take 40 mg by mouth 2 (two) times daily.         Marland Kitchen LISINOPRIL 10 MG PO TABS   Oral   Take 10 mg by mouth daily.           . MULTIVITAMINS PO TABS   Oral   Take 1 tablet by mouth daily.           Marland Kitchen POTASSIUM CHLORIDE 10 MEQ PO TBCR   Oral   Take 10 mEq by mouth 2 (two) times daily.           Marland Kitchen CARVEDILOL 6.25 MG PO TABS   Oral   Take 1 tablet (6.25 mg total) by mouth 2 (two) times daily with a meal.   60 tablet   12   . SERTRALINE HCL 25 MG PO TABS      TAKE ONE TABLET BY MOUTH DAILY   90 tablet   1   . WARFARIN SODIUM 2 MG PO TABS   Oral   Take 1 tablet (2 mg total) by mouth every 7 (seven) days. On Wednesdays only pt takes 2 mg Coumadin with 5 mg Coumadin   30 tablet   1   . WARFARIN SODIUM 5 MG PO TABS   Oral   Take 1 tablet (5 mg total) by mouth daily. Takes an additional 2 mg Warfarin with 5 mg tablets on Wednesday ONLY   30 tablet   1   . ZOLPIDEM TARTRATE 10 MG PO  TABS   Oral   Take 0.5 tablets (5 mg total) by mouth at bedtime as needed. For sleep   90 tablet   1     BP 94/52  Pulse 86  Temp 98.7 F (37.1 C)  Resp 20  Ht 5\' 5"  (1.651 m)  Wt 187 lb (84.823 kg)  BMI 31.12 kg/m2  SpO2 95%  Physical Exam  Nursing note and vitals reviewed. Constitutional: She appears well-developed and well-nourished. No distress.  HENT:  Head: Normocephalic and atraumatic.  Right Ear: Tympanic membrane and ear canal normal.  Left Ear: Tympanic membrane and ear canal normal.  Nose: Rhinorrhea present. Right sinus exhibits no maxillary sinus tenderness and no frontal sinus tenderness. Left sinus exhibits no maxillary sinus tenderness and no frontal sinus tenderness.  Mouth/Throat: Uvula is midline, oropharynx is clear and moist and mucous membranes are normal.  Cardiovascular: Normal rate, regular rhythm and normal heart sounds.   Pulmonary/Chest: Effort normal and breath sounds normal. No accessory muscle usage. Not tachypneic. No respiratory distress. She has no decreased breath sounds. She has no wheezes. She has no rales.  Neurological: She is alert.  Skin: Skin is warm and dry. No rash noted. She is not diaphoretic.  Psychiatric: She has a normal mood and affect.    ED Course  Procedures (including critical care time)  Labs Reviewed - No data to display Dg Chest 2 View  11/28/2012  *RADIOLOGY REPORT*  Clinical Data: Cough and fever  CHEST - 2 VIEW  Comparison: 11/14/2011  Findings: The heart is again enlarged in size.  A defibrillator is seen.  Postsurgical changes are noted.  There is thickening of the minor fissure on the right with some slight increased density in the right upper lobe consistent with an early infiltrate.  IMPRESSION: Changes consistent with early infiltrate in the right upper lobe.   Original Report Authenticated By: Alcide Clever, M.D.      No diagnosis found.  MDM  Patient presenting with cough and fever.  CXR shows an early  Pneumonia.  Patient has not had any recent hospitalizations in the past 3 months.  She is not hypoxic.  No respiratory distress.  Therefore, feel that patient can be treated for CAP outpatient.  Patient given prescription for Azithromycin.  Return precautions discussed.        Pascal Lux Milan, PA-C 11/29/12 2220

## 2012-11-28 NOTE — ED Notes (Signed)
C/o productive cough since Friday, 11/26/12.  Fever to 101 on 11/26/12.  Denies n/v/d.  Has taken cough drops, no Tylenol or Motrin.  No known exposure to anyone with similar sx.  Pt. Received flu vaccine this year.

## 2012-11-29 ENCOUNTER — Telehealth: Payer: Self-pay | Admitting: Internal Medicine

## 2012-11-29 NOTE — Telephone Encounter (Signed)
Was seen in the ED over the weekend and dx with pneumonia. Needed to let Dr. Drue Novel know this and also had a syncopal episode this am. Please advise.

## 2012-11-29 NOTE — Telephone Encounter (Signed)
Spoke with pt. She states that after she looks at her work schedule she will call back & schedule appt.  Called cardiology, unable to get an answer. Will try back.

## 2012-11-29 NOTE — Telephone Encounter (Signed)
Pt calling re call from friday

## 2012-11-29 NOTE — Telephone Encounter (Signed)
1. As far as the pneumonia, please arrange a followup with me in 2 weeks. 2. Had a syncope this morning, please call Dr. Graciela Husbands nurse, given her extensive cardiac history, I like them to advise the patient.

## 2012-11-29 NOTE — Telephone Encounter (Signed)
Patient Information:  Caller Name: Nirali  Phone: [phone]  Patient: [name], [name]  Gender: Female  DOB: May 05, 1951  Age: 62 Years  PCP: Willow Ora  Office Follow Up:  Does the office need to follow up with this patient?: Yes  Instructions For The Office: Please f/u and see when patient needs a f/u for her pneumonia dx and for the syncopal episode that she had this am.  RN Note:  Has a hx of CHF.  Had the syncopal episode this am.  Denies any injury.  Her husband was at home with her.  It was a very brief episode.  Symptoms  Reason For Call & Symptoms: Was seen in the ED over the weekend and dx with pneumonia.  Needed to let Dr. Drue Novel know this and also had a syncopal episode this am.  Reviewed Health History In EMR: Yes  Reviewed Medications In EMR: Yes  Reviewed Allergies In EMR: Yes  Reviewed Surgeries / Procedures: Yes  Date of Onset of Symptoms: 11/28/2012  Treatments Tried: zpac, cough meds  Treatments Tried Worked: No  Any Fever: Yes  Fever Taken: Oral  Fever Time Of Reading: 13:00:00  Fever Last Reading: 100  Guideline(s) Used:  Fainting  Disposition Per Guideline:   Go to ED Now  Reason For Disposition Reached:   History of heart problems or congestive heart failure  Advice Given:  N/A  RN Overrode Recommendation:  Document Patient  RN override, to talk with Dr. Drue Novel

## 2012-11-29 NOTE — Telephone Encounter (Signed)
Patient called and is going to cancel her procedure.  She has pneumonia and is going to wait and have her upgrade done in Feb.  She has scheduled the procedure for 01/06/13 at 7:30am  She is to be at the hospital at 5:30am  Labs on 12/31/11  Patient aware

## 2012-11-30 NOTE — Telephone Encounter (Signed)
Spoke to triage nurse & she is giving Dr. Graciela Husbands the msg to check on pt.

## 2012-12-01 ENCOUNTER — Telehealth: Payer: Self-pay | Admitting: Internal Medicine

## 2012-12-01 NOTE — Telephone Encounter (Signed)
Noted  

## 2012-12-01 NOTE — Telephone Encounter (Signed)
Patient Information:  Caller Name: Lafonda Mosses  Phone: [phone]  Patient: [name], [name]  Gender: Female  DOB: 05-10-1951  Age: 62 Years  PCP: Willow Ora  Office Follow Up:  Does the office need to follow up with this patient?: No  Instructions For The Office: N/A  RN Note:  All emergent symptoms ruled out using Hypertension, Protocol.  Pt has not heard back anything from cardiologist and pt states she would feel more comfortable if she came in and was evaluated by Dr Drue Novel.  Pt BP on 11/28/12 was 98/52.  Symptoms  Reason For Call & Symptoms: pt reports that she was diagnosed with pneumonia.  Pt states that she is now fever free as of today.  pt wants to know if she can return to work.  Pt reports her BP 88/55.  Pt also reports that she 'passed out' on Monday.  Reviewed Health History In EMR: Yes  Reviewed Medications In EMR: Yes  Reviewed Allergies In EMR: Yes  Reviewed Surgeries / Procedures: Yes  Date of Onset of Symptoms: 11/28/2012  Treatments Tried: Zpak, cough medication  Treatments Tried Worked: No  Guideline(s) Used:  No Protocol Available - Sick Adult  Disposition Per Guideline:   See Today or Tomorrow in Office  Reason For Disposition Reached:   Nursing judgment  Advice Given:  N/A  Appointment Scheduled:  12/02/2012 11:15:00 Appointment Scheduled Provider:  Willow Ora  RN had advised patient of notes from Dr Drue Novel on 11/29/12.  Pt wanted to see Dr Drue Novel tomorrow (12/02/12)  due to BP

## 2012-12-01 NOTE — Telephone Encounter (Signed)
Pt has appt 1.9.14.

## 2012-12-02 ENCOUNTER — Encounter: Payer: Self-pay | Admitting: *Deleted

## 2012-12-02 ENCOUNTER — Encounter: Payer: Self-pay | Admitting: Internal Medicine

## 2012-12-02 ENCOUNTER — Ambulatory Visit (INDEPENDENT_AMBULATORY_CARE_PROVIDER_SITE_OTHER): Payer: Managed Care, Other (non HMO) | Admitting: Internal Medicine

## 2012-12-02 VITALS — BP 88/62 | HR 73 | Temp 98.1°F | Wt 194.0 lb

## 2012-12-02 DIAGNOSIS — Z7901 Long term (current) use of anticoagulants: Secondary | ICD-10-CM

## 2012-12-02 DIAGNOSIS — J189 Pneumonia, unspecified organism: Secondary | ICD-10-CM

## 2012-12-02 DIAGNOSIS — R55 Syncope and collapse: Secondary | ICD-10-CM

## 2012-12-02 LAB — CBC WITH DIFFERENTIAL/PLATELET
Basophils Absolute: 0 10*3/uL (ref 0.0–0.1)
Eosinophils Relative: 1.8 % (ref 0.0–5.0)
Monocytes Absolute: 0.7 10*3/uL (ref 0.1–1.0)
Monocytes Relative: 10.3 % (ref 3.0–12.0)
Neutrophils Relative %: 55.1 % (ref 43.0–77.0)
Platelets: 252 10*3/uL (ref 150.0–400.0)
RDW: 13.7 % (ref 11.5–14.6)
WBC: 7.1 10*3/uL (ref 4.5–10.5)

## 2012-12-02 LAB — BASIC METABOLIC PANEL
BUN: 14 mg/dL (ref 6–23)
GFR: 61.55 mL/min (ref >60.00)
Glucose, Bld: 113 mg/dL — ABNORMAL HIGH (ref 70–99)
Potassium: 3.8 mEq/L (ref 3.5–5.1)

## 2012-12-02 LAB — POCT INR: INR: 1.9

## 2012-12-02 MED ORDER — MOXIFLOXACIN HCL 400 MG PO TABS: 400.0000 mg | ORAL_TABLET | Freq: Every day | ORAL | Status: DC

## 2012-12-02 NOTE — Progress Notes (Addendum)
  Subjective:    Patient ID: [name], female    DOB: 07-09-1951, 62 y.o.   MRN: [id number]  HPI Seen here today with the following issues:  Started with cough, fever on 11/27/2012, she was feeling poorly and > short of breath than usual. Went to the ER 11/30/2011, diagnosed with RUL pneumonia, prescribed a Z-Pak. She went home, she felt exhausted, she was standing in the kitchen and lost consciousness briefly, landed in the floor, husband was right there, she was not postictal, no bladder or bowel incontinence. She reports a bruise on the left shoulder, when asked admit that had a frontal headache for 2 days after the fall along with the neck pain. Headache and neck pain are gone.  She took the Z-Pak as prescribed, last dose today. Currently with no chest pain, still coughing some green but no rusty or bloody sputum. Very rarely has noted dizziness Denies palpitations, nausea, vomiting, dizziness. No further syncope. Had a temperature of 100 yesterday, no fever today  Past Medical History: Breast cancer, twice (first on the L breast 1982 w/ chest wall involvment, then a second breast Ca on the R in 1987) Congestive heart failure, due to Adriamycin, s/p ICD Depression Insomnia Headache Diverticulitis, hx of Osteopenia RENAL CALCULUS, HX OF (ICD-V13.01) HEADACHE Menopause since her 30s   Past Surgical History: Mastectomy, B Oophorectomy B,  per a geneticists advise at [facility] Defibrillator-AICD Replaced 5/09  Family History: MI--no DM--no Kidney Ca-- brother Colon Cancer:no Breast Cancer-- Mother, Sister  Social History: Married, 3 children all in [place], 7 Gkids   related to Mr and [name] , they are my patients as well Never Smoked Alcohol use- socially   Illicit Drug Use - no Diet-- healthy most of the time   Patient does not get regular exercise but is very active   pre school director    Review of Systems See HPI    Objective:   Physical  Exam General -- alert, well-developed, and well-nourished.   Neck --FROM Head-- atraumatic Lungs -- normal respiratory effort, no intercostal retractions, no accessory muscle use, and normal breath sounds.   Heart-- normal rate, regular rhythm, + syst  murmur, and no gallop.     Extremities-- no pretibial edema bilaterally; left shoulder that small ecchymoses at the deltoid area, range of motion normal. Neurologic-- alert & oriented X3, speech gait and motor intact.  Psych-- Cognition and judgment appear intact. Alert and cooperative with normal attention span and concentration.  not anxious appearing and not depressed appearing.       Assessment & Plan:   RUL pneumonia, Status post a Z-Pak, had a fever of 100 yesterday, no fever today Plan: Avelox x7, chest x-ray in one month  Syncope, Brief syncope in the setting  exhaustion. No further events. I expressed my concern about headache after the syncope because she is on Coumadin, we discussed a head CT, she really does not like to proceed with a CT because headache and neck pain are completely gone. We agreed that if she develops any headache, nausea or vomiting she will let me know. EKG at baseline Plan: Will notify cardiology of syncope and fax today's EKG. (spoke w/ Dr Donnie Aho) ER if symptoms resurface  INR today 1.9, shew has been on  Antibiotics, see instructions . Recheck in 5 days.  RTC 1 month

## 2012-12-02 NOTE — ED Provider Notes (Signed)
Medical screening examination/treatment/procedure(s) were performed by non-physician practitioner and as supervising physician I was immediately available for consultation/collaboration.   Marquel Pottenger, MD 12/02/12 0750 

## 2012-12-02 NOTE — Patient Instructions (Addendum)
Rest, fluids , tylenol For cough, take Mucinex DM twice a day as needed  Tussionex if cough severe, albuterol if wheezing Take the antibiotic as prescribed  (Avelox) Call if no better in few days Call anytime if the symptoms are severe, -------------- Come back in one month for a checkup ------------- Coumadin 7 mg today otherwise continue with your regular schedule Next Coumadin check in 5-6 days

## 2012-12-06 ENCOUNTER — Encounter: Payer: Self-pay | Admitting: *Deleted

## 2012-12-06 ENCOUNTER — Other Ambulatory Visit: Payer: Self-pay | Admitting: *Deleted

## 2012-12-06 MED ORDER — FUROSEMIDE 40 MG PO TABS: 40.0000 mg | ORAL_TABLET | Freq: Two times a day (BID) | ORAL | Status: DC

## 2012-12-06 NOTE — Telephone Encounter (Signed)
Pt aware Rx ready for pickup. Pt will have husband pick up at his pending OV on tomorrow.

## 2012-12-22 ENCOUNTER — Ambulatory Visit (INDEPENDENT_AMBULATORY_CARE_PROVIDER_SITE_OTHER): Payer: Managed Care, Other (non HMO) | Admitting: Internal Medicine

## 2012-12-22 ENCOUNTER — Ambulatory Visit (INDEPENDENT_AMBULATORY_CARE_PROVIDER_SITE_OTHER)
Admission: RE | Admit: 2012-12-22 | Discharge: 2012-12-22 | Disposition: A | Discharge status: Home / Self Care | Payer: Managed Care, Other (non HMO) | Source: Ambulatory Visit | Attending: Internal Medicine | Admitting: Internal Medicine

## 2012-12-22 ENCOUNTER — Encounter: Payer: Self-pay | Admitting: Internal Medicine

## 2012-12-22 VITALS — BP 96/62 | HR 78 | Temp 98.4°F | Wt 185.0 lb

## 2012-12-22 DIAGNOSIS — J189 Pneumonia, unspecified organism: Secondary | ICD-10-CM

## 2012-12-22 DIAGNOSIS — Z7901 Long term (current) use of anticoagulants: Secondary | ICD-10-CM

## 2012-12-22 MED ORDER — BUDESONIDE-FORMOTEROL FUMARATE 160-4.5 MCG/ACT IN AERO: 2.0000 | INHALATION_SPRAY | Freq: Two times a day (BID) | RESPIRATORY_TRACT | Status: DC

## 2012-12-22 MED ORDER — HYDROCOD POLST-CHLORPHEN POLST 10-8 MG/5ML PO LQCR: 5.0000 mL | Freq: Two times a day (BID) | ORAL | Status: DC | PRN

## 2012-12-22 NOTE — Assessment & Plan Note (Addendum)
Status post a Z-Pak and Avelox. Still coughing and having large airway congestion and some rhonchi. Still feeling tired, some SOB; recent CBC and BMP normal. Plan: Repeat chest x-ray Start Symbicort twice a day and albuterol when necessary If not better in 2 or 3 weeks, consider increase temporarily dose of Lasix (she has some edema) and refer  to pulmonary

## 2012-12-22 NOTE — Progress Notes (Signed)
  Subjective:    Patient ID: [name], female    DOB: Apr 04, 1951, 62 y.o.   MRN: [id number]  HPI Since the last time she was here, she took Avelox as recommended. Still not feeling 100% better, mostly feeling fatigued, still coughing w/ occ green sputum, no recent hemoptysis  Past Medical History  Diagnosis Date  . Breast CA     twice first on L breast 1982 w/ chest wall involvment, then a second breast ca on the R in 1987  . CHF (congestive heart failure)     due to Adriamycin,s/p ICD-Cardiomyopathy  . Depression   . Insomnia   . Headache   . Diverticulitis   . Osteopenia   . Renal calculus   . Headache   . Menopause     since age 20s   Past Surgical History  Procedure Date  . Mastectomy     B  . Oophorectomy     B, per genticists advise at [facility]  . 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 No chest pain or palpitations but felt  short of breath (" smothering ") this week--> 2 days ago and some    today as well. She usually sleep on 2 pillows, here lately is using 4 pillows. No lower extremity edema. No further syncope.     Objective:   Physical Exam General -- alert, well-developed, nad .   Neck -- JVD approximately 6 cm at 45 Lungs -- normal respiratory effort, no intercostal retractions, no accessory muscle use, large early congestion and rhonchi with cough noted. Heart-- normal rate, regular rhythm, + syst murmur    Abdomen--soft, non-tender, no distention, no masses, no HSM, no guarding, and no rigidity.   Extremities--  Trace  pretibial edema bilaterally  Psych--  Cognition and judgment appear intact. Alert and cooperative with normal attention span and concentration.  not anxious appearing and not depressed appearing.       Assessment & Plan:  Was seen recently with syncope, no further events

## 2012-12-22 NOTE — Patient Instructions (Addendum)
Same Coumadin, recheck in 4 weeks ---- Please get your x-ray at the other [facility]  office located at: [address] [place], across from [facility].  Please go to the basement, this is a walk-in facility, they are open from 8:30 to 5:30 PM. Phone number [phone]. --- Start Symbicort 2 puffs twice a day Use albuterol as needed for wheezing, cough and congestion Mucinex  DM twice a day as needed If you have severe cough okay to use tussionex If not improving in the next 2-3 weeks, please let me know, will refer you to our pulmonologist  --- Next routine visit with me 6 months

## 2012-12-22 NOTE — Assessment & Plan Note (Signed)
INR 2.6, no change, recheck in 4 weeks

## 2012-12-28 ENCOUNTER — Telehealth: Payer: Self-pay | Admitting: Internal Medicine

## 2012-12-28 ENCOUNTER — Ambulatory Visit: Payer: Managed Care, Other (non HMO) | Admitting: Internal Medicine

## 2012-12-28 NOTE — Telephone Encounter (Signed)
Pt received a call from [place] last week, just now able to rtn the call, has procedure 01-06-13  pls call [phone]

## 2012-12-28 NOTE — Telephone Encounter (Signed)
lmom for patient.  I did not call her but went over the instructions again for her procedure.  If she still has questions can call me back

## 2012-12-29 ENCOUNTER — Other Ambulatory Visit: Payer: Self-pay | Admitting: Internal Medicine

## 2012-12-30 ENCOUNTER — Other Ambulatory Visit: Payer: Managed Care, Other (non HMO)

## 2012-12-30 ENCOUNTER — Encounter (HOSPITAL_COMMUNITY): Payer: Self-pay | Admitting: Respiratory Therapy

## 2012-12-30 ENCOUNTER — Telehealth: Payer: Self-pay | Admitting: Internal Medicine

## 2012-12-30 NOTE — Telephone Encounter (Signed)
Spoke to dave at pharmacy. They did not receive refill sent in on 1.13.14. Re-sent rx.

## 2012-12-30 NOTE — Telephone Encounter (Signed)
New Problem:    Patient called in and she is still not feeling well and still has some inflamation form her pneumonia per Dr. Drue Novel and was not sure she should be put to sleep for her upcoming procedure.  Patient would like to post pone it until she is feeling better.  Please call back.

## 2012-12-30 NOTE — Telephone Encounter (Signed)
Spoke with patient.  We have rescheduled her procedure to 01/20/13  To be at the hospital at 8:30am  And have her labs drawn on 01/13/13.  Patient aware of times and changes

## 2012-12-30 NOTE — Telephone Encounter (Signed)
Dr. Ladona Ridgel has seen and is doing her procedure. Will forward to Dennis Bast, RN.

## 2012-12-31 ENCOUNTER — Telehealth: Payer: Self-pay | Admitting: Pulmonary Disease

## 2012-12-31 ENCOUNTER — Telehealth: Payer: Self-pay | Admitting: Internal Medicine

## 2012-12-31 NOTE — Telephone Encounter (Signed)
Opened in error. BC °

## 2012-12-31 NOTE — Telephone Encounter (Signed)
I spoke with pt and she is requesting to switch from RA to MW. She stated it is nothing personally it is just her brother see's MW. Pt aware of our protocol for switching physicians. She voiced her understanding. Please advise RA thanks

## 2013-01-06 NOTE — Telephone Encounter (Signed)
Ok with me - I have not seen her in a year - does she even need to see [place] ? If cough is persistent, she needs to change lisinopril

## 2013-01-10 ENCOUNTER — Telehealth: Payer: Self-pay | Admitting: Internal Medicine

## 2013-01-10 NOTE — Telephone Encounter (Signed)
per pt needs referral to pulmonary due to pnemonia dx cb# [phone] til 4pm--pt is still having issues with this dx, after 4pm can be reached at [phone]

## 2013-01-11 NOTE — Telephone Encounter (Signed)
Dr Sherene Sires, will you take on this ACE case??

## 2013-01-11 NOTE — Telephone Encounter (Signed)
Fine with me

## 2013-01-11 NOTE — Telephone Encounter (Signed)
Spoke with pt and scheduled appt with MW for tomorrow at 2:45 pm and advised to bring all meds to appt  She verbalized understanding and states nothing further needed

## 2013-01-12 ENCOUNTER — Ambulatory Visit (INDEPENDENT_AMBULATORY_CARE_PROVIDER_SITE_OTHER): Payer: Managed Care, Other (non HMO) | Admitting: Internal Medicine

## 2013-01-12 ENCOUNTER — Encounter: Payer: Self-pay | Admitting: Internal Medicine

## 2013-01-12 VITALS — BP 116/72 | HR 99 | Temp 98.3°F | Ht 65.0 in | Wt 188.6 lb

## 2013-01-12 DIAGNOSIS — R05 Cough: Secondary | ICD-10-CM

## 2013-01-12 DIAGNOSIS — I428 Other cardiomyopathies: Secondary | ICD-10-CM

## 2013-01-12 MED ORDER — TRAMADOL HCL 50 MG PO TABS: ORAL_TABLET | ORAL | Status: DC

## 2013-01-12 MED ORDER — PREDNISONE (PAK) 10 MG PO TABS: ORAL_TABLET | ORAL | Status: DC

## 2013-01-12 MED ORDER — OLMESARTAN MEDOXOMIL 20 MG PO TABS: ORAL_TABLET | ORAL | Status: DC

## 2013-01-12 MED ORDER — FAMOTIDINE 20 MG PO TABS: ORAL_TABLET | ORAL | Status: DC

## 2013-01-12 MED ORDER — PANTOPRAZOLE SODIUM 40 MG PO TBEC: 40.0000 mg | DELAYED_RELEASE_TABLET | Freq: Every day | ORAL | Status: DC

## 2013-01-12 NOTE — Patient Instructions (Addendum)
Stop lisinopril  And start benicar 20 mg one half daily   The key to effective treatment for your cough is eliminating the non-stop cycle of cough you're stuck in long enough to let your airway heal completely and then see if there is anything still making you cough once you stop the cough suppression, but this should take no more than 5 days to figuure out  First take delsym two tsp every 12 hours and supplement if needed with  tramadol 50 mg up to 2 every 4 hours to suppress the urge to cough. Swallowing water or using ice chips/non mint and menthol containing candies (such as lifesavers or sugarless jolly ranchers) are also effective.  You should rest your voice and avoid activities that you know make you cough.  Once you have eliminated the cough for 3 straight days try reducing the tramadol first,  then the delsym as tolerated.    Try prilosec 20mg   Take 30-60 min before first meal of the day and Pepcid 20 mg one bedtime until cough is completely gone for at least a week without the need for cough suppression  I think of reflux for chronic cough like I do oxygen for fire (doesn't cause the fire but once you get the oxygen suppressed it usually goes away regardless of the exact cause).  GERD (REFLUX)  is an extremely common cause of respiratory symptoms, many times with no significant heartburn at all.    It can be treated with medication, but also with lifestyle changes including avoidance of late meals, excessive alcohol, smoking cessation, and avoid fatty foods, chocolate, peppermint, colas, red wine, and acidic juices such as orange juice.  NO MINT OR MENTHOL PRODUCTS SO NO COUGH DROPS  USE SUGARLESS CANDY INSTEAD (jolley ranchers or Stover's)  NO OIL BASED VITAMINS - use powdered substitutes.    Prednisone 10 mg take  4 each am x 2 days,   2 each am x 2 days,  1 each am x2days and stop

## 2013-01-12 NOTE — Progress Notes (Signed)
  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   01/12/2013 1st pulmonary eval  On acei cc persistent severe cough since Aug 2013 cough to point point where gag and some foam, some urinary incont, cough worst at hs.  Also chronic doe x 100 ft with hoarsness and cough worse with exertion but not necessarily related to seasonal changes.  Already seen by Vassie Loll Jan 2013 with dx of upper airway cough and concern ACEI might be playing a role  No obvious daytime variabilty or assoc chronic cough or cp or chest tightness, subjective wheeze overt sinus or hb symptoms. No unusual exp hx or h/o childhood pna/ asthma or premature birth to her knowledge.   Sleeping ok without nocturnal  or early am exacerbation  of respiratory  c/o's or need for noct saba. Also denies any obvious fluctuation of symptoms with weather or environmental changes or other aggravating or alleviating factors except as outlined above    Review of Systems  Constitutional: Negative for fever and unexpected weight change.  HENT: Negative for ear pain, nosebleeds, congestion, sore throat, rhinorrhea, sneezing, trouble swallowing, dental problem, postnasal drip and sinus pressure.   Eyes: Negative for redness and itching.  Respiratory: Positive for cough, chest tightness and shortness of breath. Negative for wheezing.   Cardiovascular: Negative for palpitations and leg swelling.  Gastrointestinal: Positive for nausea. Negative for vomiting.  Genitourinary: Negative for dysuria.  Musculoskeletal: Negative for joint swelling.  Skin: Negative for rash.  Neurological: Negative for headaches.  Hematological: Does not bruise/bleed easily.  Psychiatric/Behavioral: Negative for dysphoric mood. The patient is not nervous/anxious.         Objective:   Physical Exam Hoarse amb wf with freq throat clearing Wt Readings from Last 3 Encounters:  01/12/13 188 lb 9.6 oz (85.548 kg)  12/22/12 185 lb (83.915 kg)  12/02/12 194 lb (87.998 kg)   HEENT: nl dentition, turbinates, and orophanx. Nl external ear canals without cough reflex   NECK :  without JVD/Nodes/TM/ nl carotid upstrokes bilaterally   LUNGS: no acc muscle use, clear to A and P bilaterally without cough on insp or exp maneuvers   CV:  RRR  no s3 or murmur or increase in P2, no edema   ABD:  soft and nontender with nl excursion in the supine position. No bruits or organomegaly, bowel sounds nl  MS:  warm without deformities, calf tenderness, cyanosis or clubbing  SKIN: warm and dry without lesions    NEURO:  alert, approp, no deficits    cxr 12/12/12 Cardiomegaly.  No acute cardiopulmonary disease.     Assessment & Plan:

## 2013-01-13 ENCOUNTER — Other Ambulatory Visit (INDEPENDENT_AMBULATORY_CARE_PROVIDER_SITE_OTHER): Payer: Managed Care, Other (non HMO)

## 2013-01-13 DIAGNOSIS — I447 Left bundle-branch block, unspecified: Secondary | ICD-10-CM

## 2013-01-13 DIAGNOSIS — I509 Heart failure, unspecified: Secondary | ICD-10-CM

## 2013-01-13 LAB — CBC WITH DIFFERENTIAL/PLATELET
Basophils Relative: 0.3 % (ref 0.0–3.0)
Eosinophils Absolute: 0 10*3/uL (ref 0.0–0.7)
Lymphocytes Relative: 16.7 % (ref 12.0–46.0)
MCHC: 33.5 g/dL (ref 30.0–36.0)
Neutrophils Relative %: 79.4 % — ABNORMAL HIGH (ref 43.0–77.0)
Platelets: 279 10*3/uL (ref 150.0–400.0)
RBC: 3.87 Mil/uL (ref 3.87–5.11)
WBC: 5.7 10*3/uL (ref 4.5–10.5)

## 2013-01-13 LAB — BASIC METABOLIC PANEL
BUN: 18 mg/dL (ref 6–23)
CO2: 23 mEq/L (ref 19–32)
Calcium: 8.4 mg/dL (ref 8.4–10.5)
Creatinine, Ser: 1 mg/dL (ref 0.4–1.2)

## 2013-01-13 LAB — PROTIME-INR
INR: 3.3 ratio — ABNORMAL HIGH (ref 0.8–1.0)
Prothrombin Time: 34 s — ABNORMAL HIGH (ref 10.2–12.4)

## 2013-01-16 NOTE — Assessment & Plan Note (Signed)
ACE inhibitors are problematic in  pts with airway complaints because  even experienced pulmonologists can't always distinguish ace effects from copd/asthma.  By themselves they don't actually cause a problem, much like oxygen can't by itself start a fire, but they certainly serve as a powerful catalyst or enhancer for any "fire"  or inflammatory process in the upper airway, be it caused by an ET  tube or more commonly reflux (especially in the obese or pts with known GERD or who are on biphoshonates).    In the era of ARB near equivalency until we have a better handle on the reversibility of the airway problem, it just makes sense to avoid ACEI  entirely in the short run and then decide later, having established a level of airway control using a reasonable limited regimen, whether to add back ace but even then being very careful to observe the pt for worsening airway control and number of meds used/ needed to control symptoms.    For now rec change to benicar.  See instructions for specific recommendations which were reviewed directly with the patient who was given a copy with highlighter outlining the key components.

## 2013-01-16 NOTE — Assessment & Plan Note (Signed)
The most common causes of chronic cough in immunocompetent adults include the following: upper airway cough syndrome (UACS), previously referred to as postnasal drip syndrome (PNDS), which is caused by variety of rhinosinus conditions; (2) asthma; (3) GERD; (4) chronic bronchitis from cigarette smoking or other inhaled environmental irritants; (5) nonasthmatic eosinophilic bronchitis; and (6) bronchiectasis.   These conditions, singly or in combination, have accounted for up to 94% of the causes of chronic cough in prospective studies.   Other conditions have constituted no >6% of the causes in prospective studies These have included bronchogenic carcinoma, chronic interstitial pneumonia, sarcoidosis, left ventricular failure, ACEI-induced cough, and aspiration from a condition associated with pharyngeal dysfunction.    Chronic cough is often simultaneously caused by more than one condition. A single cause has been found from 38 to 82% of the time, multiple causes from 18 to 62%. Multiply caused cough has been the result of three diseases up to 42% of the time.       Agree with Dr Vassie Loll this is  Upper airway cough syndrome, so named because it's frequently impossible to sort out how much is  CR/sinusitis with freq throat clearing (which can be related to primary GERD)   vs  causing  secondary (" extra esophageal")  GERD from wide swings in gastric pressure that occur with throat clearing, often  promoting self use of mint and menthol lozenges that reduce the lower esophageal sphincter tone and exacerbate the problem further in a cyclical fashion.   These are the same pts (now being labeled as having "irritable larynx syndrome" by some cough centers) who not infrequently have a history of having failed to tolerate ace inhibitors,  dry powder inhalers or biphosphonates or report having atypical reflux symptoms that don't respond to standard doses of PPI , and are easily confused as having aecopd or asthma  flares by even experienced allergists/ pulmonologists.   For now try to eliminate cyclical cough fueled (but not likely sparked) by acei  Until stops coughing needs also rx for gerd

## 2013-01-17 ENCOUNTER — Encounter: Payer: Self-pay | Admitting: Family Medicine

## 2013-01-17 ENCOUNTER — Telehealth: Payer: Self-pay | Admitting: Internal Medicine

## 2013-01-17 ENCOUNTER — Ambulatory Visit (INDEPENDENT_AMBULATORY_CARE_PROVIDER_SITE_OTHER): Payer: Managed Care, Other (non HMO) | Admitting: Family Medicine

## 2013-01-17 VITALS — BP 90/60 | HR 80 | Temp 98.1°F | Ht 63.75 in | Wt 188.0 lb

## 2013-01-17 DIAGNOSIS — R071 Chest pain on breathing: Secondary | ICD-10-CM

## 2013-01-17 DIAGNOSIS — R0789 Other chest pain: Secondary | ICD-10-CM | POA: Insufficient documentation

## 2013-01-17 NOTE — Telephone Encounter (Signed)
New Problem   Pt has been sick and went to see PCP. PCP advised speaking to nurse about potentially rescheduling procedure again. Would like to speak to nurse.

## 2013-01-17 NOTE — Progress Notes (Signed)
  Subjective:    Patient ID: [name], female    DOB: 06-Feb-1951, 62 y.o.   MRN: [id number]  HPI Rib pain- pt had PNA in late Jan and 'i haven't felt well since then'.  Was referred to pulm and saw Dr Sherene Sires last week.  meds were changed and cough has improved (prednisone).  'i just hurt so bad w/ coughing or moving'.  Pain is located along R ribs in mid-axillary line.  Pain is intermittently sharp, no improvement w/ prednisone.  Has used heat.  Has ultram available for cough and pain- not using regularly.   Review of Systems For ROS see HPI     Objective:   Physical Exam  Vitals reviewed. Constitutional: She is oriented to person, place, and time. She appears well-developed and well-nourished. No distress.  Cardiovascular: Normal rate, regular rhythm and normal heart sounds.   Pulmonary/Chest: Effort normal and breath sounds normal. No respiratory distress. She has no wheezes. She has no rales. She exhibits tenderness (over R ribs at level of breast in mid-axillary line w/out obvious deformity).  Neurological: She is alert and oriented to person, place, and time.  Skin: Skin is warm and dry.  Psychiatric: She has a normal mood and affect. Her behavior is normal.          Assessment & Plan:

## 2013-01-17 NOTE — Telephone Encounter (Signed)
Called the patient and left her a message in regards to trying to reschedule her procedure per Dr Odessa Fleming request.

## 2013-01-17 NOTE — Patient Instructions (Addendum)
This is a combination of pleurisy, chest wall strain, and breast strain Increase your Ultram use to improve your pain- this will avoid playing catch up Heat or ice your chest wall- whichever feels better Wear a good supportive sports bra Call Dr Graciela Husbands and review your situation- you may want to put off the lead replacement Give yourself time to recover HANG IN THERE!!!

## 2013-01-17 NOTE — Telephone Encounter (Signed)
[name] can you call her please  thnaks steve H  i guess K is out, can i ask you please to call her as she wont be back until wed

## 2013-01-17 NOTE — Telephone Encounter (Signed)
Left message for pt to call back.  Will forward to [facility] and Dr. Graciela Husbands.

## 2013-01-17 NOTE — Assessment & Plan Note (Signed)
New.  Most likely due to pt's recent PNA and ongoing cough.  Pt unable to take NSAIDs due to coumadin use.  Currently on prednisone.  Encouraged heat or ice for pain relief.  Pt to use Ultram regularly to get ahead of pain.  No obvious bony deformity and none seen on xray from late Jan.  Reviewed supportive care and red flags that should prompt return.  Pt expressed understanding and is in agreement w/ plan.

## 2013-01-18 ENCOUNTER — Telehealth: Payer: Self-pay | Admitting: Internal Medicine

## 2013-01-18 NOTE — Telephone Encounter (Signed)
New Prob     Pt is wanting to reschedule her procedure being performed by Dr. Ladona Ridgel. Following up on a phone call from yesterday.

## 2013-01-18 NOTE — Telephone Encounter (Signed)
Patient called to cancelled the Bi-ventricular Implantable Cardioverter defibrillator Upgrade for 01/20/13  At 10:30; because pt  Said had Pneumonia in January 2014; she was having a cough and pain on right side of back. Pt was seen by he PCP yesterday, which said pt has Pleurodesis. PCP recommended for pt  to re-schedule this procedure until she gets better.   Cath/EP - cath lab aware I spoke Thereasa Distance.

## 2013-01-19 ENCOUNTER — Other Ambulatory Visit: Payer: Self-pay | Admitting: Internal Medicine

## 2013-01-20 ENCOUNTER — Encounter (HOSPITAL_COMMUNITY): Admission: RE | Payer: Self-pay | Source: Ambulatory Visit

## 2013-01-20 ENCOUNTER — Ambulatory Visit (HOSPITAL_COMMUNITY)
Admission: RE | Admit: 2013-01-20 | Payer: Managed Care, Other (non HMO) | Source: Ambulatory Visit | Admitting: Internal Medicine

## 2013-01-20 SURGERY — BI-VENTRICULAR IMPLANTABLE CARDIOVERTER DEFIBRILLATOR UPGRADE
Anesthesia: LOCAL

## 2013-01-20 NOTE — Telephone Encounter (Signed)
Refill done.  

## 2013-01-21 ENCOUNTER — Telehealth: Payer: Self-pay | Admitting: Internal Medicine

## 2013-01-21 NOTE — Telephone Encounter (Signed)
New Prob    Pt called in wanting to reschedule her outpatient procedure with Dr. Ladona Ridgel. Would like to speak to nurse.

## 2013-01-26 ENCOUNTER — Encounter: Payer: Self-pay | Admitting: Internal Medicine

## 2013-01-26 ENCOUNTER — Telehealth: Payer: Self-pay | Admitting: Internal Medicine

## 2013-01-26 MED ORDER — TRAMADOL HCL 50 MG PO TABS: ORAL_TABLET | ORAL | Status: DC

## 2013-01-26 NOTE — Telephone Encounter (Signed)
Refill of the tramadol has been sent and i called and spoke with pt and she is aware. Nothing further is needed.

## 2013-01-26 NOTE — Telephone Encounter (Signed)
This encounter was created in error - please disregard.

## 2013-01-26 NOTE — Telephone Encounter (Signed)
Pt wants to know if her procedure will be rsc and if so what date because she is well now and wants to get this done while she is feeling ok

## 2013-01-26 NOTE — Telephone Encounter (Signed)
     Pt wants to know if her procedure will be rsc and if so what date because she is well now and wants to get this done while she is feeling ok

## 2013-01-27 ENCOUNTER — Encounter: Payer: Self-pay | Admitting: *Deleted

## 2013-01-27 NOTE — Telephone Encounter (Signed)
Procedure rescheduled  Patient aware of date and time

## 2013-01-31 ENCOUNTER — Encounter (HOSPITAL_COMMUNITY): Payer: Self-pay | Admitting: Pharmacy Technician

## 2013-01-31 ENCOUNTER — Other Ambulatory Visit: Payer: Self-pay | Admitting: *Deleted

## 2013-01-31 ENCOUNTER — Ambulatory Visit (INDEPENDENT_AMBULATORY_CARE_PROVIDER_SITE_OTHER): Payer: Managed Care, Other (non HMO) | Admitting: *Deleted

## 2013-01-31 DIAGNOSIS — I447 Left bundle-branch block, unspecified: Secondary | ICD-10-CM

## 2013-01-31 DIAGNOSIS — I472 Ventricular tachycardia: Secondary | ICD-10-CM

## 2013-01-31 DIAGNOSIS — I5022 Chronic systolic (congestive) heart failure: Secondary | ICD-10-CM

## 2013-01-31 DIAGNOSIS — I428 Other cardiomyopathies: Secondary | ICD-10-CM

## 2013-01-31 LAB — CBC WITH DIFFERENTIAL/PLATELET
Basophils Absolute: 0.1 10*3/uL (ref 0.0–0.1)
Basophils Relative: 0.7 % (ref 0.0–3.0)
Eosinophils Absolute: 0.1 10*3/uL (ref 0.0–0.7)
HCT: 32.8 % — ABNORMAL LOW (ref 36.0–46.0)
Hemoglobin: 11.2 g/dL — ABNORMAL LOW (ref 12.0–15.0)
Lymphs Abs: 2.2 10*3/uL (ref 0.7–4.0)
MCHC: 34 g/dL (ref 30.0–36.0)
Monocytes Relative: 10.8 % (ref 3.0–12.0)
Neutro Abs: 4.2 10*3/uL (ref 1.4–7.7)
RBC: 3.61 Mil/uL — ABNORMAL LOW (ref 3.87–5.11)
RDW: 14.6 % (ref 11.5–14.6)

## 2013-01-31 LAB — BASIC METABOLIC PANEL
BUN: 15 mg/dL (ref 6–23)
CO2: 25 mEq/L (ref 19–32)
GFR: 60.09 mL/min (ref >60.00)
Glucose, Bld: 95 mg/dL (ref 70–99)
Potassium: 3.3 mEq/L — ABNORMAL LOW (ref 3.5–5.1)

## 2013-02-02 ENCOUNTER — Telehealth: Payer: Self-pay | Admitting: Internal Medicine

## 2013-02-02 NOTE — Telephone Encounter (Signed)
I spoke with pt and she stated her cough is worse. It has been making her vomit. Spoke with MW ok to add her on tomorrow. Nothing further was needed.

## 2013-02-03 ENCOUNTER — Ambulatory Visit (INDEPENDENT_AMBULATORY_CARE_PROVIDER_SITE_OTHER): Payer: Managed Care, Other (non HMO) | Admitting: Internal Medicine

## 2013-02-03 ENCOUNTER — Encounter: Payer: Self-pay | Admitting: Internal Medicine

## 2013-02-03 ENCOUNTER — Telehealth: Payer: Self-pay | Admitting: Internal Medicine

## 2013-02-03 VITALS — BP 110/76 | HR 116 | Temp 97.9°F | Ht 65.0 in | Wt 191.4 lb

## 2013-02-03 DIAGNOSIS — I428 Other cardiomyopathies: Secondary | ICD-10-CM

## 2013-02-03 DIAGNOSIS — R05 Cough: Secondary | ICD-10-CM

## 2013-02-03 MED ORDER — CHLORHEXIDINE GLUCONATE 4 % EX LIQD
60.0000 mL | Freq: Once | CUTANEOUS | Status: DC
  Filled 2013-02-03: qty 60

## 2013-02-03 MED ORDER — SODIUM CHLORIDE 0.9 % IV SOLN: 250.0000 mL | INTRAVENOUS | Status: DC

## 2013-02-03 MED ORDER — SODIUM CHLORIDE 0.9 % IJ SOLN: 3.0000 mL | Freq: Two times a day (BID) | INTRAMUSCULAR | Status: DC

## 2013-02-03 MED ORDER — OLMESARTAN MEDOXOMIL 20 MG PO TABS: 10.0000 mg | ORAL_TABLET | Freq: Every day | ORAL | Status: DC

## 2013-02-03 MED ORDER — SODIUM CHLORIDE 0.9 % IJ SOLN: 3.0000 mL | INTRAMUSCULAR | Status: DC | PRN

## 2013-02-03 MED ORDER — BENZONATATE 200 MG PO CAPS: ORAL_CAPSULE | ORAL | Status: DC

## 2013-02-03 MED ORDER — SODIUM CHLORIDE 0.9 % IR SOLN
80.0000 mg | Status: DC
  Filled 2013-02-03: qty 2

## 2013-02-03 MED ORDER — VANCOMYCIN HCL IN DEXTROSE 1-5 GM/200ML-% IV SOLN
1000.0000 mg | INTRAVENOUS | Status: DC
  Filled 2013-02-03 (×2): qty 200

## 2013-02-03 MED ORDER — SODIUM CHLORIDE 0.45 % IV SOLN: INTRAVENOUS | Status: DC

## 2013-02-03 MED ORDER — SODIUM CHLORIDE 0.45 % IV SOLN
INTRAVENOUS | Status: DC
  Administered 2013-02-04: 10:00:00 via INTRAVENOUS

## 2013-02-03 NOTE — Assessment & Plan Note (Addendum)
-   Trial off acei started 01/13/13  She is clearly better at this point typical of  Classic Upper airway cough syndrome, so named because it's frequently impossible to sort out how much is  CR/sinusitis with freq throat clearing (which can be related to primary GERD)   vs  causing  secondary (" extra esophageal")  GERD from wide swings in gastric pressure that occur with throat clearing, often  promoting self use of mint and menthol lozenges that reduce the lower esophageal sphincter tone and exacerbate the problem further in a cyclical fashion.   These are the same pts (now being labeled as having "irritable larynx syndrome" by some cough centers) who not infrequently have a history of having failed to tolerate ace inhibitors ( as may be the case here) dry powder inhalers or biphosphonates or report having atypical reflux symptoms that don't respond to standard doses of PPI , and are easily confused as having aecopd or asthma flares by even experienced allergists/ pulmonologists.  Will add tessilon and continue off acei

## 2013-02-03 NOTE — Patient Instructions (Addendum)
Benzonate 200 mg every 4-6 hours as needed for tickle  If not satisfied in 2 weeks call Libby at [phone] to schedule a sinus ct next

## 2013-02-03 NOTE — Progress Notes (Signed)
  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   01/12/2013 1st pulmonary eval  On acei cc persistent severe cough since Aug 2013 cough to point point where gag and some foam, some urinary incont, cough worst at hs.  Also chronic doe x 100 ft with hoarsness and cough worse with exertion but not necessarily related to seasonal changes.  Already seen by [name] Jan 2013 with dx of upper airway cough and concern ACEI might be playing a role. rec Stop lisinopril  And start benicar 20 mg one half daily  First take delsym two tsp every 12 hours and supplement if needed with  tramadol 50 mg up to 2 every 4 hours  Try prilosec 20mg   Take 30-60 min before first meal of the day and Pepcid 20 mg one bedtime  GERD diet  Prednisone 10 mg take  4 each am x 2 days,   2 each am x 2 days,  1 each am x2days and stop     02/03/2013 f/u ov/[name] cc cough is better, still has urge to clear with occ production of mostly white mucus, no sob   No obvious daytime variabilty or assoc   cp or chest tightness, subjective wheeze overt sinus or hb symptoms. No unusual exp hx or h/o childhood pna/ asthma or premature birth to her knowledge.   Sleeping ok without nocturnal  or early am exacerbation  of respiratory  c/o's or need for noct saba. Also denies any obvious fluctuation of symptoms with weather or environmental changes or other aggravating or alleviating factors except as outlined above   ROS  The following are not active complaints unless bolded sore throat, dysphagia, dental problems, itching, sneezing,  nasal congestion or excess/ purulent secretions, ear ache,   fever, chills, sweats, unintended wt loss, pleuritic or exertional cp, hemoptysis,  orthopnea pnd or leg swelling, presyncope, palpitations, heartburn,  abdominal pain, anorexia, nausea, vomiting, diarrhea  or change in bowel or urinary habits, change in stools or urine, dysuria,hematuria,  rash, arthralgias, visual complaints, headache, numbness weakness or ataxia or problems with walking or coordination,  change in mood/affect or memory.             Objective:   Physical Exam Hoarse amb wf with less throat clearing clearing  02/05/2013  Wt 191  Wt Readings from Last 3 Encounters:  01/12/13 188 lb 9.6 oz (85.548 kg)  12/22/12 185 lb (83.915 kg)  12/02/12 194 lb (87.998 kg)   HEENT: nl dentition, turbinates, and orophanx. Nl external ear canals without cough reflex   NECK :  without JVD/Nodes/TM/ nl carotid upstrokes bilaterally   LUNGS: no acc muscle use, clear to A and P bilaterally without cough on insp or exp maneuvers   CV:  RRR  no s3 or murmur or increase in P2, no edema   ABD:  soft and nontender with nl excursion in the supine position. No bruits or organomegaly, bowel sounds nl  MS:  warm without deformities, calf tenderness, cyanosis or clubbing  SKIN: warm and dry without lesions    NEURO:  alert, approp, no deficits    cxr 12/22/12 Cardiomegaly.  No acute cardiopulmonary disease.     Assessment & Plan:

## 2013-02-03 NOTE — Telephone Encounter (Signed)
Took her Coumadin and is not going to take it tonight and is going to go to the hospital tomorrow.  We have rescheduled her procedure 3-4 times

## 2013-02-03 NOTE — Telephone Encounter (Signed)
Pt has questions regarding her medication and her procedure tomorrow and needs to talk to someone today I tried to call Herbert Seta and she explained to send you the message because you knew all about it

## 2013-02-04 ENCOUNTER — Encounter (HOSPITAL_COMMUNITY)
Admission: RE | Disposition: A | Discharge status: Home / Self Care | Payer: Self-pay | Source: Ambulatory Visit | Attending: Internal Medicine

## 2013-02-04 ENCOUNTER — Ambulatory Visit (HOSPITAL_COMMUNITY)
Admission: RE | Admit: 2013-02-04 | Discharge: 2013-02-05 | Disposition: A | Discharge status: Home / Self Care | Payer: Managed Care, Other (non HMO) | Source: Ambulatory Visit | Attending: Internal Medicine | Admitting: Internal Medicine

## 2013-02-04 DIAGNOSIS — I5022 Chronic systolic (congestive) heart failure: Secondary | ICD-10-CM | POA: Insufficient documentation

## 2013-02-04 DIAGNOSIS — I472 Ventricular tachycardia: Secondary | ICD-10-CM | POA: Diagnosis present

## 2013-02-04 DIAGNOSIS — Z7901 Long term (current) use of anticoagulants: Secondary | ICD-10-CM

## 2013-02-04 DIAGNOSIS — I428 Other cardiomyopathies: Secondary | ICD-10-CM | POA: Insufficient documentation

## 2013-02-04 DIAGNOSIS — I447 Left bundle-branch block, unspecified: Secondary | ICD-10-CM | POA: Insufficient documentation

## 2013-02-04 DIAGNOSIS — I509 Heart failure, unspecified: Secondary | ICD-10-CM | POA: Insufficient documentation

## 2013-02-04 DIAGNOSIS — Z853 Personal history of malignant neoplasm of breast: Secondary | ICD-10-CM

## 2013-02-04 HISTORY — PX: BI-VENTRICULAR IMPLANTABLE CARDIOVERTER DEFIBRILLATOR UPGRADE: SHX5461

## 2013-02-04 HISTORY — DX: Other cardiomyopathies: I42.8

## 2013-02-04 HISTORY — DX: Chronic systolic (congestive) heart failure: I50.22

## 2013-02-04 LAB — BASIC METABOLIC PANEL
Calcium: 8.8 mg/dL (ref 8.4–10.5)
Creatinine, Ser: 0.87 mg/dL (ref 0.50–1.10)
GFR calc Af Amer: 80 mL/min — ABNORMAL LOW (ref >90)
GFR calc non Af Amer: 69 mL/min — ABNORMAL LOW (ref >90)

## 2013-02-04 LAB — PROTIME-INR: Prothrombin Time: 29.7 seconds — ABNORMAL HIGH (ref 11.6–15.2)

## 2013-02-04 LAB — SURGICAL PCR SCREEN
MRSA, PCR: NEGATIVE
Staphylococcus aureus: NEGATIVE

## 2013-02-04 SURGERY — BI-VENTRICULAR IMPLANTABLE CARDIOVERTER DEFIBRILLATOR UPGRADE
Anesthesia: LOCAL

## 2013-02-04 MED ORDER — LIDOCAINE HCL (PF) 1 % IJ SOLN
INTRAMUSCULAR | Status: AC
  Filled 2013-02-04: qty 60

## 2013-02-04 MED ORDER — MUPIROCIN 2 % EX OINT
TOPICAL_OINTMENT | CUTANEOUS | Status: AC
  Administered 2013-02-04: 1 via NASAL
  Filled 2013-02-04: qty 22

## 2013-02-04 MED ORDER — FENTANYL CITRATE 0.05 MG/ML IJ SOLN
INTRAMUSCULAR | Status: AC
  Filled 2013-02-04: qty 2

## 2013-02-04 MED ORDER — WARFARIN SODIUM 5 MG PO TABS
5.0000 mg | ORAL_TABLET | Freq: Every day | ORAL | Status: DC
  Filled 2013-02-04: qty 1

## 2013-02-04 MED ORDER — FUROSEMIDE 40 MG PO TABS
40.0000 mg | ORAL_TABLET | Freq: Two times a day (BID) | ORAL | Status: DC
  Administered 2013-02-04 – 2013-02-05 (×2): 40 mg via ORAL
  Filled 2013-02-04 (×3): qty 1

## 2013-02-04 MED ORDER — IRBESARTAN 75 MG PO TABS
75.0000 mg | ORAL_TABLET | Freq: Every day | ORAL | Status: DC
  Administered 2013-02-04 – 2013-02-05 (×2): 75 mg via ORAL
  Filled 2013-02-04 (×2): qty 1

## 2013-02-04 MED ORDER — MIDAZOLAM HCL 5 MG/5ML IJ SOLN
INTRAMUSCULAR | Status: AC
  Filled 2013-02-04: qty 5

## 2013-02-04 MED ORDER — PANTOPRAZOLE SODIUM 40 MG PO TBEC
40.0000 mg | DELAYED_RELEASE_TABLET | Freq: Every day | ORAL | Status: DC
  Administered 2013-02-04 – 2013-02-05 (×2): 40 mg via ORAL
  Filled 2013-02-04 (×2): qty 1

## 2013-02-04 MED ORDER — WARFARIN SODIUM 2 MG PO TABS: 2.0000 mg | ORAL_TABLET | ORAL | Status: DC

## 2013-02-04 MED ORDER — WARFARIN - PHARMACIST DOSING INPATIENT: Freq: Every day | Status: DC

## 2013-02-04 MED ORDER — VANCOMYCIN HCL 10 G IV SOLR
2000.0000 mg | Freq: Two times a day (BID) | INTRAVENOUS | Status: AC
  Administered 2013-02-04: 2000 mg via INTRAVENOUS
  Filled 2013-02-04: qty 2000

## 2013-02-04 MED ORDER — ONDANSETRON HCL 4 MG/2ML IJ SOLN: 4.0000 mg | Freq: Four times a day (QID) | INTRAMUSCULAR | Status: DC | PRN

## 2013-02-04 MED ORDER — HEPARIN (PORCINE) IN NACL 2-0.9 UNIT/ML-% IJ SOLN
INTRAMUSCULAR | Status: AC
  Filled 2013-02-04: qty 500

## 2013-02-04 MED ORDER — ACETAMINOPHEN 325 MG PO TABS
325.0000 mg | ORAL_TABLET | ORAL | Status: DC | PRN
  Administered 2013-02-04 – 2013-02-05 (×2): 650 mg via ORAL
  Filled 2013-02-04 (×2): qty 2

## 2013-02-04 MED ORDER — WARFARIN - PHYSICIAN DOSING INPATIENT: Freq: Every day | Status: DC

## 2013-02-04 MED ORDER — CARVEDILOL 6.25 MG PO TABS
6.2500 mg | ORAL_TABLET | Freq: Two times a day (BID) | ORAL | Status: DC
  Administered 2013-02-05: 6.25 mg via ORAL
  Filled 2013-02-04 (×3): qty 1

## 2013-02-04 MED ORDER — POTASSIUM CHLORIDE CRYS ER 10 MEQ PO TBCR
10.0000 meq | EXTENDED_RELEASE_TABLET | Freq: Two times a day (BID) | ORAL | Status: DC
  Administered 2013-02-04 – 2013-02-05 (×2): 10 meq via ORAL
  Filled 2013-02-04 (×3): qty 1

## 2013-02-04 MED ORDER — MUPIROCIN 2 % EX OINT
TOPICAL_OINTMENT | Freq: Two times a day (BID) | CUTANEOUS | Status: DC
  Filled 2013-02-04: qty 22

## 2013-02-04 MED ORDER — SERTRALINE HCL 25 MG PO TABS
25.0000 mg | ORAL_TABLET | Freq: Every day | ORAL | Status: DC
  Administered 2013-02-04: 25 mg via ORAL
  Filled 2013-02-04 (×2): qty 1

## 2013-02-04 MED ORDER — FAMOTIDINE 20 MG PO TABS
20.0000 mg | ORAL_TABLET | Freq: Two times a day (BID) | ORAL | Status: DC
  Administered 2013-02-04 – 2013-02-05 (×2): 20 mg via ORAL
  Filled 2013-02-04 (×3): qty 1

## 2013-02-04 MED ORDER — ZOLPIDEM TARTRATE 5 MG PO TABS
5.0000 mg | ORAL_TABLET | Freq: Every day | ORAL | Status: DC
  Administered 2013-02-04: 5 mg via ORAL
  Filled 2013-02-04: qty 1

## 2013-02-04 NOTE — H&P (Signed)
HPI [name] is referred today by Dr. Graciela Kirby for consideration for biventricular ICD revision. The patient is a very pleasant 62 year old woman with a history of nonischemic cardiomyopathy, chronic systolic heart failure, left bundle branch block, status post ICD implantation. She has a history of Adriamycin cardiomyopathy. The patient initially did well but has had worsening heart failure symptoms in the last year. Her left ventricular lead was found to have a very high capture threshold of over 6 V and 2 ms. Unfortunately, her heart failure symptoms have worsened. Walking across the room, she will get tired after stopping take breath. Despite this, she is still working as a Manufacturing systems engineer. Allergies   Allergen  Reactions   .  Atacand (Candesartan)         Fatigue    .  Cephalexin         REACTION: itching, hives   .  Spironolactone         Fatigue            Current Outpatient Prescriptions   Medication  Sig  Dispense  Refill   .  albuterol (VENTOLIN HFA) 108 (90 BASE) MCG/ACT inhaler  Inhale 2 puffs into the lungs every 6 (six) hours as needed. For shortness of breath/wheezing         .  carvedilol (COREG) 6.25 MG tablet  Take 1 tablet (6.25 mg total) by mouth 2 (two) times daily with a meal.   60 tablet   12   .  fluticasone (FLONASE) 50 MCG/ACT nasal spray  Place 2 sprays into the nose daily as needed. For sinuses         .  furosemide (LASIX) 40 MG tablet  Take 40 mg by mouth 2 (two) times daily.         Marland Kitchen  lisinopril (PRINIVIL,ZESTRIL) 10 MG tablet  Take 10 mg by mouth daily.           .  multivitamin (THERAGRAN) per tablet  Take 1 tablet by mouth daily.           .  potassium chloride (KLOR-CON) 10 MEQ CR tablet  Take 10 mEq by mouth 2 (two) times daily.           .  sertraline (ZOLOFT) 25 MG tablet  TAKE ONE TABLET BY MOUTH DAILY   90 tablet   1   .  warfarin (COUMADIN) 2 MG tablet  Take 2 mg by mouth every 7 (seven) days. On Wednesdays only pt takes 2 mg Coumadin with  5 mg Coumadin         .  warfarin (COUMADIN) 5 MG tablet  Take 5 mg by mouth daily. Takes an additional 2 mg Warfarin with 5 mg tablets on Wednesday ONLY         .  zolpidem (AMBIEN) 10 MG tablet  Take 0.5 tablets (5 mg total) by mouth at bedtime as needed. For sleep   90 tablet   1           Past Medical History   Diagnosis  Date   .  Breast CA         twice first on L breast 1982 w/ chest wall involvment, then a second breast ca on the R in 1987   .  CHF (congestive heart failure)         due to Adriamycin,s/p ICD-Cardiomyopathy   .  Depression     .  Insomnia     .  Headache     .  Diverticulitis     .  Osteopenia     .  Renal calculus     .  Headache     .  Menopause         since age 33s        ROS:    All systems reviewed and negative except as noted in the HPI.      Past Surgical History   Procedure  Date   .  Mastectomy         B   .  Oophorectomy         B, per genticists advise at [place]   .  Cardiac defibrillator placement         AICD Replaced-5/09           Family History   Problem  Relation  Age of Onset   .  Heart attack  Neg Hx     .  Diabetes  Neg Hx     .  Colon cancer  Neg Hx     .  Kidney cancer  Brother     .  Breast cancer  Mother     .  Cancer  Mother         breast   .  Breast cancer  Sister     .  Cancer  Sister         breast           History       Social History   .  Marital Status:  Married       Spouse Name:  N/A       Number of Children:  N/A   .  Years of Education:  N/A       Occupational History   .  Not on file.       Social History Main Topics   .  Smoking status:  Never Smoker    .  Smokeless tobacco:  Never Used   .  Alcohol Use:  Yes         Comment: socially   .  Drug Use:  No   .  Sexually Active:  Not on file       Other Topics  Concern   .  Not on file       Social History Narrative     Related to Mr and Mrs [name], they are my patients as well Patients does not get regular  exercise but its very activePre school director           BP 99/66  Pulse 78  Ht 5\' 4"  (1.626 m)  Wt 188 lb (85.276 kg)  BMI 32.27 kg/m2   Physical Exam:   Well appearing middle-aged woman, NAD HEENT: Unremarkable Neck:  7 cm JVD, no thyromegally Lungs:  Clear except for rales in the bases bilaterally. No wheezes, no rhonchi, and no increased work of breathing. HEART:  Regular rate rhythm, no murmurs, no rubs, no clicks, split S2. Abd:  soft, positive bowel sounds, no organomegally, no rebound, no guarding Ext:  2 plus pulses, no edema, no cyanosis, no clubbing Skin:  No rashes no nodules Neuro:  CN II through XII intact, motor grossly intact   EKG sinus rhythm with left bundle branch block   DEVICE   Normal device function.  See PaceArt for details. Her left ventricular lead was not being utilized   Assess/Plan:  1. Chronic systolic heart failure -       I discussed the treatment options regarding her chronic systolic heart failure. The venography several weeks ago demonstrated that her subclavian vein was in fact patent. The risk, goals, benefits, and expectations of left ventricular lead revision have been discussed with the patient. Also the option of surgical insertion of an epicardial left ventricular lead has also been discussed. The patient would like to proceed with an endovascular approach.   Leonia Reeves.D.

## 2013-02-04 NOTE — Op Note (Signed)
Successful BiV ICD lead revision without immediate complication. [id number].

## 2013-02-04 NOTE — Progress Notes (Signed)
ANTICOAGULATION CONSULT NOTE - Initial Consult  Pharmacy Consult for Warfarin Indication: cardiomyopathy  Allergies  Allergen Reactions  . Atacand (Candesartan)     Fatigue   . Cephalexin     REACTION: itching, hives  . Spironolactone     Fatigue   . Latex Rash    Patient Measurements: Height: 5\' 5"  (165.1 cm) Weight: 191 lb (86.637 kg) IBW/kg (Calculated) : 57 Heparin Dosing Weight: n/a  Vital Signs: Temp: 98 F (36.7 C) (03/14 1700) Temp src: Oral (03/14 1700) BP: 103/58 mmHg (03/14 1830) Pulse Rate: 92 (03/14 1830)  Labs:  Recent Labs  02/04/13 1007  LABPROT 29.7*  INR 3.02*  CREATININE 0.87    Estimated Creatinine Clearance: 71.9 ml/min (by C-G formula based on Cr of 0.87).   Medical History: Past Medical History  Diagnosis Date  . Breast CA     twice first on L breast 1982 w/ chest wall involvment, then a second breast ca on the R in 1987  . CHF (congestive heart failure)     due to Adriamycin,s/p ICD-Cardiomyopathy  . Depression   . Insomnia   . Headache   . Diverticulitis   . Osteopenia   . Renal calculus   . Headache   . Menopause     since age 31s    Medications:  Scheduled:  . [START ON 02/05/2013] carvedilol  6.25 mg Oral BID WC  . famotidine  20 mg Oral BID  . [COMPLETED] fentaNYL      . furosemide  40 mg Oral BID  . [COMPLETED] heparin      . irbesartan  75 mg Oral Daily  . [COMPLETED] lidocaine (PF)      . [COMPLETED] midazolam      . [COMPLETED] midazolam      . pantoprazole  40 mg Oral Daily  . potassium chloride  10 mEq Oral BID  . sertraline  25 mg Oral Daily  . vancomycin  2,000 mg Intravenous Q12H  . [START ON 02/09/2013] warfarin  2 mg Oral Q7 days  . warfarin  5 mg Oral Daily  . [START ON 02/09/2013] Warfarin - Physician Dosing Inpatient   Does not apply q1800  . zolpidem  5 mg Oral QHS  . [DISCONTINUED] chlorhexidine  60 mL Topical Once  . [DISCONTINUED] chlorhexidine  60 mL Topical Once  . [DISCONTINUED]  gentamicin irrigation  80 mg Irrigation On Call  . [DISCONTINUED] mupirocin ointment   Nasal BID  . [DISCONTINUED] sodium chloride  3 mL Intravenous Q12H  . [DISCONTINUED] sodium chloride  3 mL Intravenous Q12H  . [DISCONTINUED] vancomycin  1,000 mg Intravenous On Call    Assessment: 62 yo female on chronic anticoagulation with Coumadin for cardiomyopathy.  INR just slightly above goal today.  Of note, INR was 4.6 as outpatient 01/31/13.  No bleeding or complications noted.  Per patient, last dose of outpatient Coumadin was Wednesday (7 mg).  S/P BiV lead revision procedure today.  PTA Coumadin dose is 5 mg daily except 7 mg on Wednesday.  Goal of Therapy:  INR 2-3 Monitor platelets by anticoagulation protocol: Yes   Plan:  1. Will hold Coumadin tonight given supratherapeutic INR. 2. F/U AM INR. 3. Likely can resume Coumadin tomorrow.  May need slight reduction in outpatient dosing.  Tad Moore, BCPS  Clinical Pharmacist Pager [phone]  02/04/2013 7:39 PM

## 2013-02-05 ENCOUNTER — Encounter: Payer: Self-pay | Admitting: Internal Medicine

## 2013-02-05 ENCOUNTER — Encounter (HOSPITAL_COMMUNITY): Payer: Self-pay | Admitting: *Deleted

## 2013-02-05 ENCOUNTER — Ambulatory Visit (HOSPITAL_COMMUNITY): Payer: Managed Care, Other (non HMO)

## 2013-02-05 DIAGNOSIS — I428 Other cardiomyopathies: Secondary | ICD-10-CM

## 2013-02-05 LAB — PROTIME-INR
INR: 2.38 — ABNORMAL HIGH (ref 0.00–1.49)
Prothrombin Time: 24.9 seconds — ABNORMAL HIGH (ref 11.6–15.2)

## 2013-02-05 MED ORDER — WARFARIN SODIUM 2 MG PO TABS: 2.0000 mg | ORAL_TABLET | ORAL | Status: DC

## 2013-02-05 MED ORDER — WARFARIN SODIUM 5 MG PO TABS
5.0000 mg | ORAL_TABLET | Freq: Once | ORAL | Status: DC
  Filled 2013-02-05: qty 1

## 2013-02-05 MED ORDER — WARFARIN SODIUM 5 MG PO TABS: 5.0000 mg | ORAL_TABLET | Freq: Every day | ORAL | Status: DC

## 2013-02-05 NOTE — Assessment & Plan Note (Addendum)
Diagnosed 1998 with normal coronary arteries at cath Repeat cath 2009 no CAD,  LAD-PA fistula ECHO  09/15/12 25% moderate AI and MR Change acei to arb effective 01/13/13 due to cough  Adequate control on present rx, reviewed need to continue off acei for now and on benicar 20 mg daily

## 2013-02-05 NOTE — Progress Notes (Signed)
  Patient Name: [name]      SUBJECTIVE: s/p LV lead revision brathing somewhat better this am  Past Medical History  Diagnosis Date  . Breast CA     twice first on L breast 1982 w/ chest wall involvment, then a second breast ca on the R in 1987  . CHF (congestive heart failure)     due to Adriamycin,s/p ICD-Cardiomyopathy  . Depression   . Insomnia   . Headache   . Diverticulitis   . Osteopenia   . Renal calculus   . Headache   . Menopause     since age 24s    PHYSICAL EXAM Filed Vitals:   02/04/13 2034 02/05/13 0436 02/05/13 0800 02/05/13 1011  BP: 93/57 105/71 102/67 99/55  Pulse: 92 86 90 87  Temp: 98.7 F (37.1 C) 98.6 F (37 C)    TempSrc: Oral Oral    Resp: 18 18    Height:      Weight:      SpO2: 96% 94%     Well developed and nourished in no acute distress HENT normal Neck supple with JVP-flat Clea Device pocket  Mild swelling no erythena Regular rate and rhythm, no murmurs or gallops Abd-soft with active BS No Clubbing cyanosis edema Skin-warm and dry A & Oriented  Grossly normal sensory and motor function  TELEMETRY: Reviewed telemetry pt in    No intake or output data in the 24 hours ending 02/05/13 1113  LABS: Basic Metabolic Panel:  Recent Labs Lab 01/31/13 1111 02/04/13 1007  NA 138 138  K 3.3* 3.6  CL 105 103  CO2 25 25  GLUCOSE 95 147*  BUN 15 11  CREATININE 1.0 0.87  CALCIUM 8.5 8.8   Cardiac Enzymes: No results found for this basename: CKTOTAL, CKMB, CKMBINDEX, TROPONINI,  in the last 72 hours CBC:  Recent Labs Lab 01/31/13 1111  WBC 7.3  NEUTROABS 4.2  HGB 11.2*  HCT 32.8*  MCV 90.8  PLT 249.0   PROTIME:  Recent Labs  02/04/13 1007 02/05/13 0500  LABPROT 29.7* 24.9*  INR 3.02* 2.38*     ASSESSMENT AND PLAN:  S/p ICD-CRT upgrade for failed LV lead Home today No coumadin x 5 days wound check 10-14 days F/u SK 3-4 weeks with BMET  Signed, Sherryl Manges MD  02/05/2013

## 2013-02-05 NOTE — Op Note (Signed)
[name], [name] NO.:  [id number]  MEDICAL RECORD NO.:  [id number]  LOCATION:  3W12C                        FACILITY:  [facility]  PHYSICIAN:  Doylene Canning. Ladona Ridgel, MD    DATE OF BIRTH:  [birth year]  DATE OF PROCEDURE:  02/04/2013 DATE OF DISCHARGE:                              OPERATIVE REPORT   PROCEDURE PERFORMED:  Biventricular upgrade in the left ventricular lead revision.  INTRODUCTION:  The patient is a very pleasant 62 year old woman who has got a longstanding history of chronic systolic heart failure and nonischemic cardiomyopathy.  She underwent initial biventricular ICD insertion in 2005.  In 2007, she was found to have an elevated pacing threshold and her LV lead was discontinued.  Her heart failure was initially stable, but then in the last several months, she has had worsening heart failure symptoms.  Her LV lead is nonfunctional, though it has not been dislodged.  She is now referred for insertion of a new device.  PROCEDURE:  After informed consent was obtained, the patient was taken to the diagnostic EP Lab in a fasting state.  After usual preparation and draping, intravenous fentanyl and midazolam were given for sedation. A 30 mL of lidocaine was infiltrated into the left infraclavicular region.  A 7-cm incision was carried out over this region. Electrocautery was utilized to dissect down to the fascial plane.  Care was taken not to enter the ICD pocket.  The subclavian vein was then punctured and a 9.5-French sheath was advanced into the subclavian vein. There was a moderate amount of stenotic area, but the vein was patent. The Medtronic MB2 guiding catheter and a 6-French hexapolar EP catheter were utilized to cannulate the coronary sinus which was partially occluded.  Venography of the coronary sinus confirmed this.  A Glidewire was then advanced into the subtotally occluded portion of the coronary sinus and was utilized to traverse across the  very tight stenosis.  The dilator and MB2 guiding catheter were then advanced over the Glidewire into the distal portion of the coronary sinus.  Venography of the coronary sinus was carried out.  It demonstrated a lateral vein which coursed posteriorly just below the previously implanted LV lead which was nonfunctional.  The subselective catheter was utilized to cannulate this lateral vein with modest difficulty.  A Luge 0.14 angioplasty guidewire was advanced into the vein and out into its anastomosis and out in the middle cardiac vein.  The Medtronic Attain Ability, model [id number], 88-cm bipolar pacing lead, serial [id number] was advanced into the lateral vein.  A10 V pacing did not stimulate the diaphragm.  The threshold was around 1.2 V at 0.5 milliseconds.  The impedance was around 800 ohms.  With these satisfactory parameters, the guiding catheter was liberated from the lead in the usual manner.  The lead was secured to the subpectoral fascia with a figure-of-eight silk suture and the sewing sleeve was secured with a silk suture.  Electrocautery was then utilized to enter the ICD pocket and the old generator was removed. The atrial and the RV lead were evaluated and found to be working satisfactorily.  The pocket was irrigated.  The [employer] CRT-D BiV ICD, serial [id number] was  connected to the new LV lead along with the old right atrial and right ventricular leads and placed back in the subcutaneous pocket.  The pocket was irrigated with antibiotic irrigation and the incision was closed with 2-0 and 3-0 Vicryl.  At this point, I scrubbed out of the case and supervised defibrillation threshold testing.  After the patient was more deeply sedated under my direct supervision with intravenous fentanyl and midazolam, VF was induced with a T-wave shock.  A 21-joule shock was delivered which terminated ventricular fibrillation and restored sinus rhythm.  No additional DFT  testing was carried out, and a pressure dressing was placed over this pocket and the patient was returned to her room in satisfactory condition.  COMPLICATIONS:  There were no immediate procedure complications.  RESULTS:  Demonstrate successful LV lead revision in a patient with a previously nonfunctioning LV lead.  The old dual-chamber ICD was removed and a biventricular ICD was inserted.     Doylene Canning. Ladona Ridgel, MD     GWT/MEDQ  D:  02/04/2013  T:  02/05/2013  Job:  [id number]  cc:   Duke Salvia, MD, [facility]

## 2013-02-05 NOTE — Progress Notes (Signed)
ANTICOAGULATION CONSULT NOTE - Follow Up Consult  Pharmacy Consult for Warfarin Indication: cardiomyopathy  Allergies  Allergen Reactions  . Atacand (Candesartan)     Fatigue   . Cephalexin     REACTION: itching, hives  . Spironolactone     Fatigue   . Latex Rash    Patient Measurements: Height: 5\' 5"  (165.1 cm) Weight: 191 lb (86.637 kg) IBW/kg (Calculated) : 57   Vital Signs: Temp: 98.6 F (37 C) (03/15 0436) Temp src: Oral (03/15 0436) BP: 102/67 mmHg (03/15 0800) Pulse Rate: 90 (03/15 0800)  Labs:  Recent Labs  02/04/13 1007 02/05/13 0500  LABPROT 29.7* 24.9*  INR 3.02* 2.38*  CREATININE 0.87  --     Estimated Creatinine Clearance: 71.9 ml/min (by C-G formula based on Cr of 0.87).   Medical History: Past Medical History  Diagnosis Date  . Breast CA     twice first on L breast 1982 w/ chest wall involvment, then a second breast ca on the R in 1987  . CHF (congestive heart failure)     due to Adriamycin,s/p ICD-Cardiomyopathy  . Depression   . Insomnia   . Headache   . Diverticulitis   . Osteopenia   . Renal calculus   . Headache   . Menopause     since age 46s    Medications:  Scheduled:  . carvedilol  6.25 mg Oral BID WC  . famotidine  20 mg Oral BID  . [COMPLETED] fentaNYL      . furosemide  40 mg Oral BID  . [COMPLETED] heparin      . irbesartan  75 mg Oral Daily  . [COMPLETED] lidocaine (PF)      . [COMPLETED] midazolam      . [COMPLETED] midazolam      . pantoprazole  40 mg Oral Daily  . potassium chloride  10 mEq Oral BID  . sertraline  25 mg Oral Daily  . [COMPLETED] vancomycin  2,000 mg Intravenous Q12H  . Warfarin - Pharmacist Dosing Inpatient   Does not apply q1800  . zolpidem  5 mg Oral QHS  . [DISCONTINUED] chlorhexidine  60 mL Topical Once  . [DISCONTINUED] chlorhexidine  60 mL Topical Once  . [DISCONTINUED] gentamicin irrigation  80 mg Irrigation On Call  . [DISCONTINUED] mupirocin ointment   Nasal BID  .  [DISCONTINUED] sodium chloride  3 mL Intravenous Q12H  . [DISCONTINUED] sodium chloride  3 mL Intravenous Q12H  . [DISCONTINUED] vancomycin  1,000 mg Intravenous On Call  . [DISCONTINUED] warfarin  2 mg Oral Q7 days  . [DISCONTINUED] warfarin  5 mg Oral Daily  . [DISCONTINUED] Warfarin - Physician Dosing Inpatient   Does not apply q1800    Assessment: 62 yo female on chronic anticoagulation with Coumadin for cardiomyopathy.  INR 3.02 on admit -Coumadin dose held 3/14.  INR 2.38 today.  Of note, INR was 4.6 as outpatient 01/31/13.  No bleeding or complications noted.  Per patient, last dose of outpatient Coumadin was Wednesday (7 mg).  S/P BiV lead revision procedure 3/14.  PTA Coumadin dose is 5 mg daily except 7 mg on Wednesday.  Goal of Therapy:  INR 2-3 Monitor platelets by anticoagulation protocol: Yes   Plan:  Coumadin 5mg  x1 today Daily Protime  Leota Sauers Pharm.D. CPP, BCPS Clinical Pharmacist [phone] 02/05/2013 8:51 AM

## 2013-02-05 NOTE — Discharge Summary (Signed)
Patient ID: [name],  MRN: [id number], DOB/AGE: [birth year] 62 y.o.  Admit date: 02/04/2013 Discharge date: 02/05/2013  Primary Care Provider: Willow Ora Primary Cardiologist: Odessa Fleming, MD  Discharge Diagnoses Principal Problem:   Nonischemic cardiomyopathy  **S/P BiV ICD upgrade this admission with placement of a [employer] CRT-D BiV ICD, serial [id number] and new LV lead.   Active Problems:   Chronic systolic heart failure   Ventricular tachycardia   LBBB (left bundle branch block)   History of breast cancer   Long-term (current) use of anticoagulants  Allergies Allergies  Allergen Reactions  . Atacand (Candesartan)     Fatigue   . Cephalexin     REACTION: itching, hives  . Spironolactone     Fatigue   . Latex Rash   Procedures  BiV ICD Upgrade 01/22/2013  PROCEDURE PERFORMED:  Biventricular upgrade in the left ventricular lead Revision.  [employer] E[employer] CRT-D BiV ICD, serial [id number] was connected to the new LV lead along with the old right atrial and right ventricular leads and placed back in the subcutaneous pocket. _____________  History of Present Illness  62 y/o female with h/o NICM and LV dysfunction secondary to prior Adriamycin therapy in the setting of breast cancer.  She previously underwent BiV ICD insertion in 2005 with subsequent discontinuation of LV lead therapy in 2007 secondary to elevated pacing thresholds.  Recently, she had some worsening of CHF symptoms and decision was made to pursue BiV ICD upgrade with LV lead revision.  Hospital Course  Pt presented to the [facility] EP lab on 3/14 and underwent successful placement of a [employer] CRT-D BiV ICD.  She tolerated procedure well and post-procedure cxr showed no evidence of pneumothorax.  Her INR has been supratherapeutic and as a result, we will continue to hold her coumadin for 5 additional days to reduce her risk of pocket hematoma.  She will be discharged  home today in good condition.  Discharge Vitals Blood pressure 99/55, pulse 87, temperature 98.6 F (37 C), temperature source Oral, resp. rate 18, height 5\' 5"  (1.651 m), weight 191 lb (86.637 kg), SpO2 94.00%.  Filed Weights   02/04/13 1000  Weight: 191 lb (86.637 kg)   Labs  Basic Metabolic Panel  Recent Labs  02/04/13 1007  NA 138  K 3.6  CL 103  CO2 25  GLUCOSE 147*  BUN 11  CREATININE 0.87  CALCIUM 8.8   Disposition  Pt is being discharged home today in good condition.  Follow-up Plans & Appointments  Follow-up Information   Follow up with Sherryl Manges, MD. (3-4 wks.  We will arrange and also obtain a blood chemistry that day.)    Contact information:   [address] [place] [address] [phone]       Follow up with [facility] On 02/17/2013. (10:30 AM)    Contact information:   [address] [place] [address] [phone]      Follow up with Sandrea Hughs, MD On 02/09/2013. (9:30 AM)    Contact information:   [address] [place] [address] [phone]       Follow up with coumadin clinic. (1 week after resuming coumadin)     Discharge Medications    Medication List    TAKE these medications       benzonatate 200 MG capsule  Commonly known as:  TESSALON  One every 4-6 hours as needed  Biotin 5000 MCG Caps  Take 1 capsule by mouth daily.     carvedilol 6.25 MG tablet  Commonly known as:  COREG  Take 1 tablet (6.25 mg total) by mouth 2 (two) times daily with a meal.     Cyanocobalamin 1500 MCG Tbdp  Take 1 tablet by mouth daily.     DELSYM PO  Take 10 mLs by mouth every 12 (twelve) hours as needed. For cough     famotidine 20 MG tablet  Commonly known as:  PEPCID  Take 20 mg by mouth 2 (two) times daily. One at bedtime     furosemide 40 MG tablet  Commonly known as:  LASIX  Take 40 mg by mouth 2 (two) times daily.     multivitamin per tablet  Take 1  tablet by mouth daily.     olmesartan 20 MG tablet  Commonly known as:  BENICAR  Take 0.5 tablets (10 mg total) by mouth daily. One half daily     pantoprazole 40 MG tablet  Commonly known as:  PROTONIX  Take 40 mg by mouth daily. Take 30-60 min before first meal of the day     potassium chloride 10 MEQ CR tablet  Commonly known as:  KLOR-CON  Take 10 mEq by mouth 2 (two) times daily.     PROBIOTIC DAILY PO  Take 1 tablet by mouth daily.     sertraline 25 MG tablet  Commonly known as:  ZOLOFT  Take 25 mg by mouth daily.     traMADol 50 MG tablet  Commonly known as:  ULTRAM  Take 50 mg by mouth every 4 (four) hours as needed for pain. 1-2 every 4 hours as needed for cough or pain     warfarin 2 MG tablet - TO BE RESUMED ON 02/10/2013  Commonly known as:  COUMADIN  Take 1 tablet (2 mg total) by mouth every 7 (seven) days. On Wednesdays only pt takes 2 mg Coumadin with 5 mg Coumadin     warfarin 5 MG tablet - - TO BE RESUMED ON 02/10/2013  Commonly known as:  COUMADIN  Take 1 tablet (5 mg total) by mouth daily. Takes Daily. On Wednesdays takes with an additional 2 mg tablet for total of 7 mg     zolpidem 10 MG tablet  Commonly known as:  AMBIEN  Take 0.5 tablets (5 mg total) by mouth at bedtime as needed. For sleep      Outstanding Labs/Studies  bmet upon f/u in 3-4 wks.  Duration of Discharge Encounter   Greater than 30 minutes including physician time.  Signed, Nicolasa Ducking NP 02/05/2013, 12:23 PM

## 2013-02-07 ENCOUNTER — Telehealth: Payer: Self-pay | Admitting: Internal Medicine

## 2013-02-07 NOTE — Telephone Encounter (Signed)
Pt had surgery last week , told to call if any questions

## 2013-02-07 NOTE — Telephone Encounter (Signed)
Feeling much better, take the sling off 24 hours after.  No lifting over 10 pounds for 6-8 weeks

## 2013-02-09 ENCOUNTER — Ambulatory Visit: Payer: Managed Care, Other (non HMO) | Admitting: Internal Medicine

## 2013-02-16 ENCOUNTER — Ambulatory Visit (INDEPENDENT_AMBULATORY_CARE_PROVIDER_SITE_OTHER): Payer: Managed Care, Other (non HMO) | Admitting: *Deleted

## 2013-02-16 VITALS — BP 104/66 | HR 92

## 2013-02-16 DIAGNOSIS — Z7901 Long term (current) use of anticoagulants: Secondary | ICD-10-CM

## 2013-02-16 LAB — POCT INR: INR: 1.3

## 2013-02-16 NOTE — Patient Instructions (Addendum)
Continue taking 5mg  daily & 7mg  on Wednesday.  Return to the office in 5 days.

## 2013-02-17 ENCOUNTER — Ambulatory Visit (INDEPENDENT_AMBULATORY_CARE_PROVIDER_SITE_OTHER): Payer: Managed Care, Other (non HMO) | Admitting: *Deleted

## 2013-02-17 ENCOUNTER — Encounter: Payer: Self-pay | Admitting: Internal Medicine

## 2013-02-17 ENCOUNTER — Other Ambulatory Visit: Payer: Self-pay

## 2013-02-17 DIAGNOSIS — I5022 Chronic systolic (congestive) heart failure: Secondary | ICD-10-CM

## 2013-02-17 DIAGNOSIS — I428 Other cardiomyopathies: Secondary | ICD-10-CM

## 2013-02-17 LAB — ICD DEVICE OBSERVATION
AL AMPLITUDE: 4.9 mv
ATRIAL PACING ICD: 1 pct
DEVICE MODEL ICD: 111235
HV IMPEDENCE: 46 Ohm
LV LEAD IMPEDENCE ICD: 637 Ohm
RV LEAD IMPEDENCE ICD: 437 Ohm
RV LEAD THRESHOLD: 1.2 V
TZON-0003FASTVT: 333.3 ms

## 2013-02-17 NOTE — Progress Notes (Signed)
Wound check-ICD 

## 2013-02-23 IMAGING — CR DG CHEST 2V
2 series · 2 of 2 positions shown · non-contrast
Comparison: 11/14/2011

CLINICAL DATA: Cough and fever

CHEST - 2 VIEW

[w chest pa]
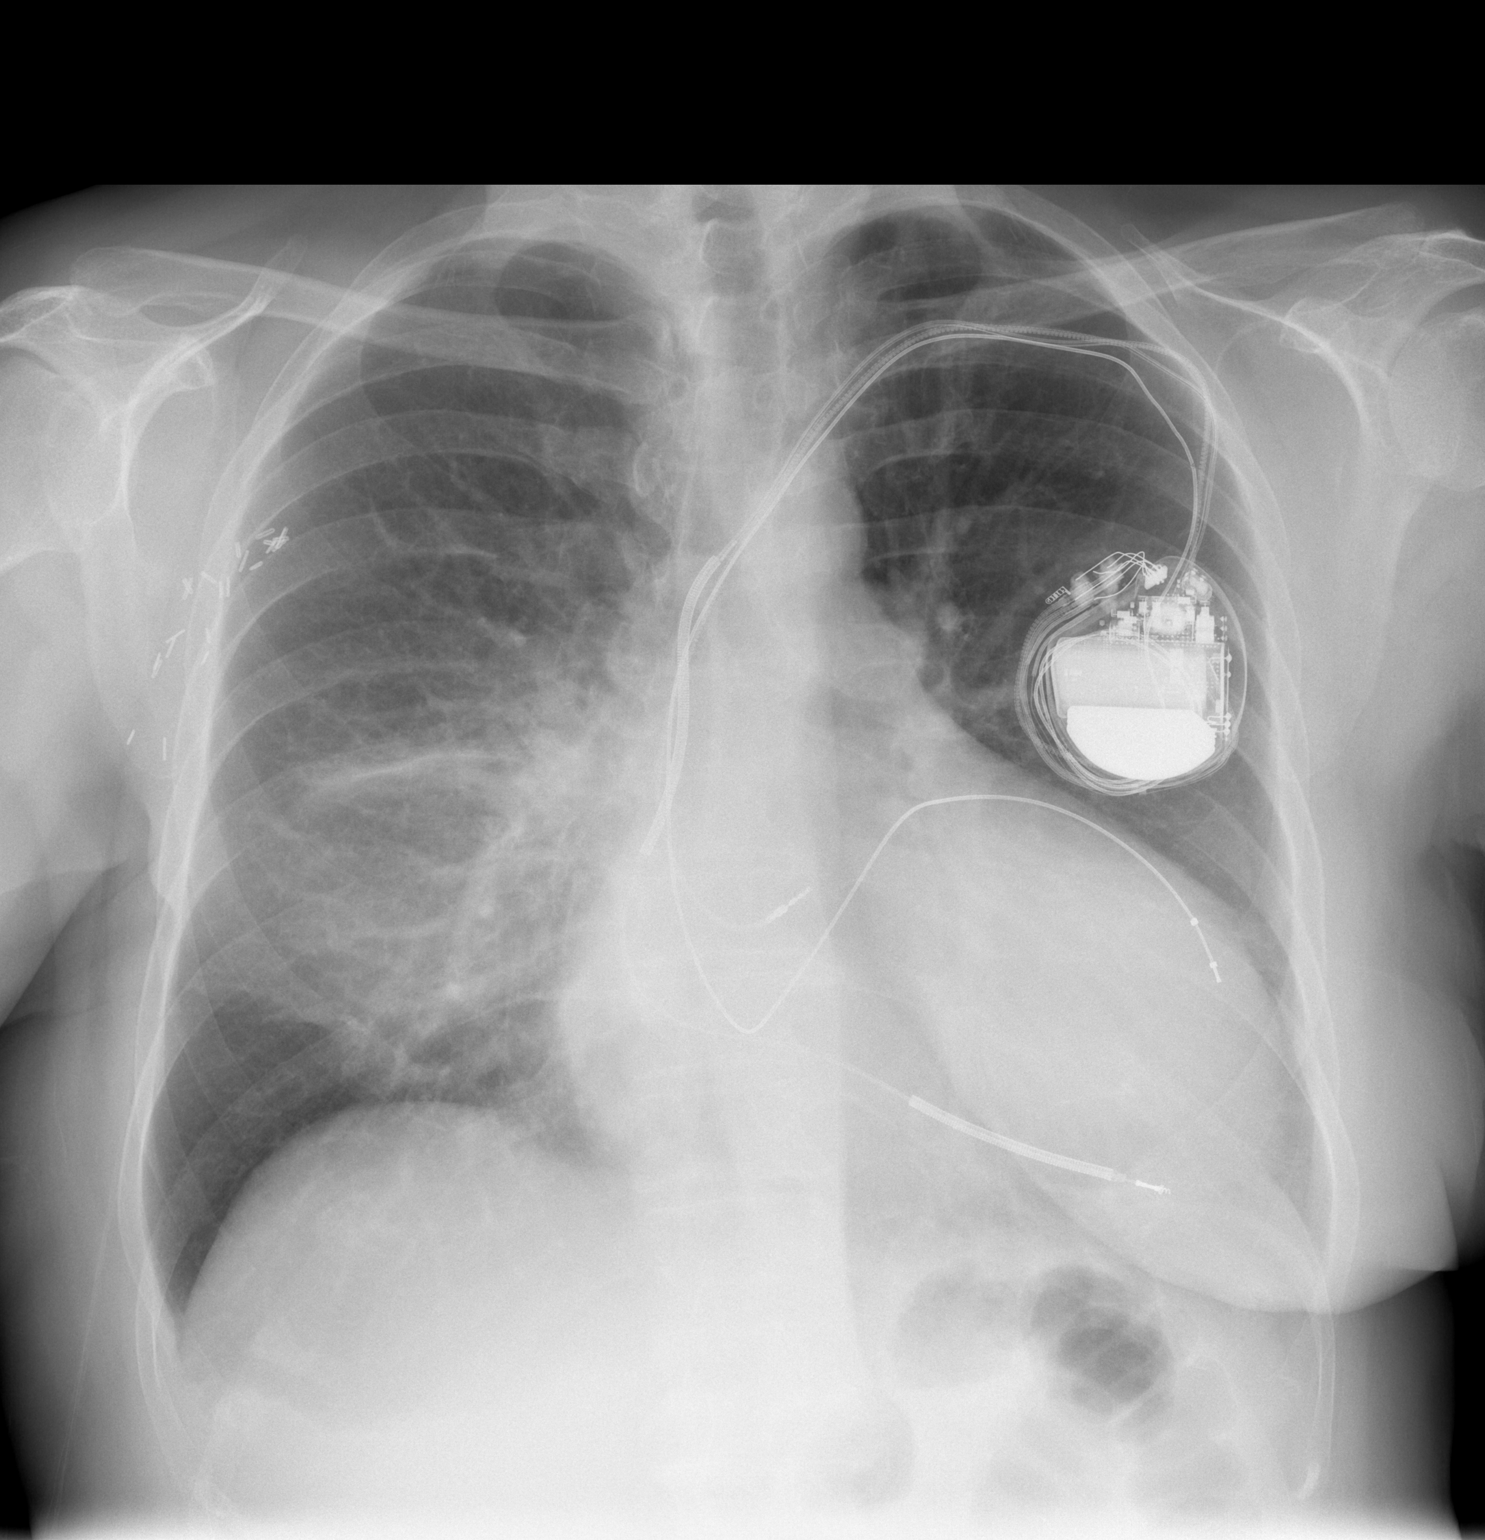

[w chest lat]
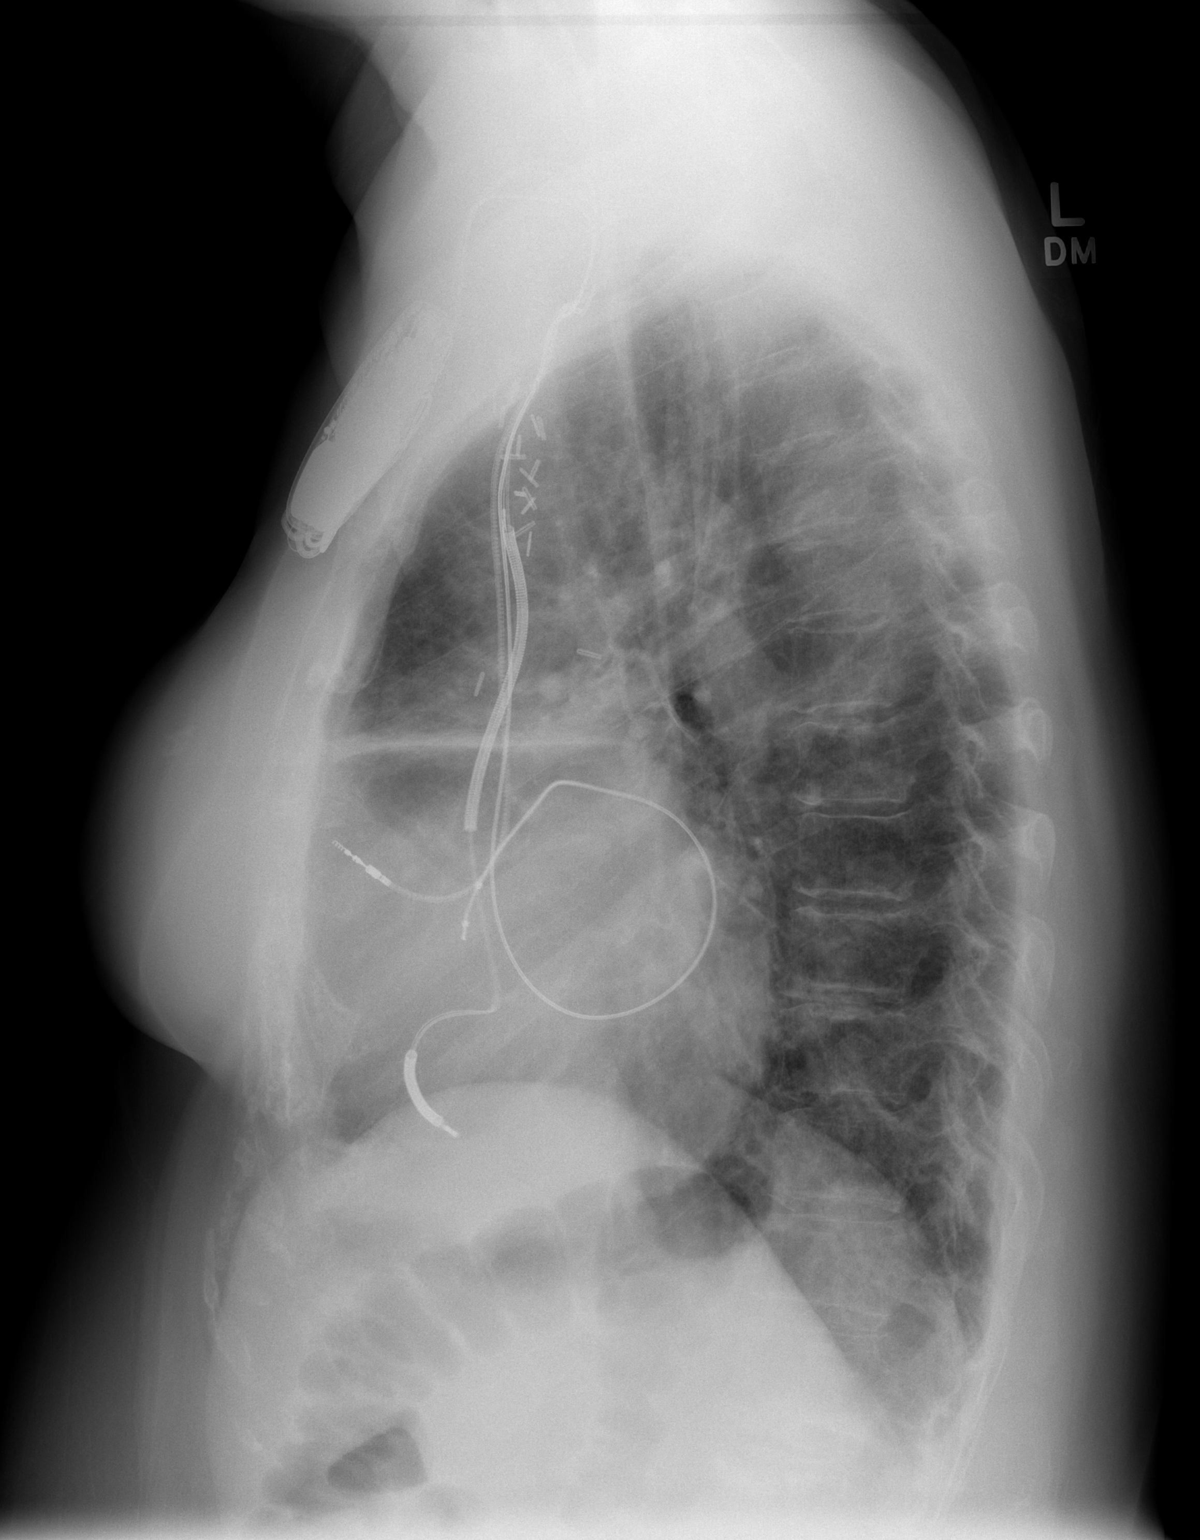

[2 of 2 positions shown; findings below may reference images not displayed]

FINDINGS: The heart is again enlarged in size.  A defibrillator is
seen.  Postsurgical changes are noted.  There is thickening of the
minor fissure on the right with some slight increased density in
the right upper lobe consistent with an early infiltrate.
IMPRESSION: Changes consistent with early infiltrate in the right upper lobe.

## 2013-02-24 ENCOUNTER — Telehealth: Payer: Self-pay | Admitting: Internal Medicine

## 2013-02-24 NOTE — Telephone Encounter (Signed)
Needs INR checked this week

## 2013-02-25 ENCOUNTER — Ambulatory Visit (INDEPENDENT_AMBULATORY_CARE_PROVIDER_SITE_OTHER): Payer: Managed Care, Other (non HMO) | Admitting: *Deleted

## 2013-02-25 VITALS — BP 98/64 | HR 81

## 2013-02-25 DIAGNOSIS — Z7901 Long term (current) use of anticoagulants: Secondary | ICD-10-CM

## 2013-02-25 LAB — POCT INR: INR: 1.7

## 2013-02-25 NOTE — Telephone Encounter (Signed)
Pt scheduled for today, 02/25/13, at 2:15 for PT/INR check.

## 2013-02-25 NOTE — Patient Instructions (Signed)
Continue same dosage. Take 5mg  daily & 7mg  on Wednesday.  Return to the office in 2 weeks-(pt states she would not like to come back in 1 week.)

## 2013-02-28 ENCOUNTER — Telehealth: Payer: Self-pay | Admitting: Internal Medicine

## 2013-02-28 NOTE — Telephone Encounter (Signed)
I spoke with the patient. Dr. Donnie Aho wants her to be seen by Dr. Graciela Husbands and evaluate her device. Appointment made for 03/17/13 at 12:00 pm. She is aware and agreeable.

## 2013-02-28 NOTE — Telephone Encounter (Signed)
New Problem:    Patient called in because she was instructed by Dr. Arlyn Leak to call and schedule and appointment to see Dr. Graciela Husbands within the next three weeks to evaluate her device.  Please call back and feel free to leave a message.

## 2013-03-15 ENCOUNTER — Telehealth: Payer: Self-pay | Admitting: Internal Medicine

## 2013-03-15 NOTE — Telephone Encounter (Signed)
Please schedule OV for INR check

## 2013-03-16 NOTE — Telephone Encounter (Signed)
Pt scheduled for pt/inr check tomorrow, 03/17/13.

## 2013-03-16 NOTE — Telephone Encounter (Signed)
Thank you :)

## 2013-03-17 ENCOUNTER — Encounter: Payer: Self-pay | Admitting: Internal Medicine

## 2013-03-17 ENCOUNTER — Telehealth: Payer: Self-pay | Admitting: *Deleted

## 2013-03-17 ENCOUNTER — Ambulatory Visit (INDEPENDENT_AMBULATORY_CARE_PROVIDER_SITE_OTHER): Payer: Managed Care, Other (non HMO) | Admitting: *Deleted

## 2013-03-17 ENCOUNTER — Ambulatory Visit (INDEPENDENT_AMBULATORY_CARE_PROVIDER_SITE_OTHER): Payer: Managed Care, Other (non HMO) | Admitting: Internal Medicine

## 2013-03-17 VITALS — BP 90/60 | HR 79 | Wt 174.0 lb

## 2013-03-17 VITALS — BP 96/51 | HR 71 | Ht 64.0 in | Wt 179.0 lb

## 2013-03-17 DIAGNOSIS — T82837A Hemorrhage of cardiac prosthetic devices, implants and grafts, initial encounter: Secondary | ICD-10-CM | POA: Insufficient documentation

## 2013-03-17 DIAGNOSIS — I428 Other cardiomyopathies: Secondary | ICD-10-CM

## 2013-03-17 DIAGNOSIS — I5022 Chronic systolic (congestive) heart failure: Secondary | ICD-10-CM

## 2013-03-17 DIAGNOSIS — T82897A Other specified complication of cardiac prosthetic devices, implants and grafts, initial encounter: Secondary | ICD-10-CM

## 2013-03-17 DIAGNOSIS — Z7901 Long term (current) use of anticoagulants: Secondary | ICD-10-CM

## 2013-03-17 LAB — BASIC METABOLIC PANEL
BUN: 15 mg/dL (ref 6–23)
Calcium: 9 mg/dL (ref 8.4–10.5)
Chloride: 103 mEq/L (ref 96–112)
Creatinine, Ser: 0.8 mg/dL (ref 0.4–1.2)
GFR: 76.81 mL/min (ref >60.00)

## 2013-03-17 LAB — ICD DEVICE OBSERVATION
AL IMPEDENCE ICD: 547 Ohm
AL THRESHOLD: 0.7 V
ATRIAL PACING ICD: 0 pct
LV LEAD AMPLITUDE: 11.7 mv
LV LEAD IMPEDENCE ICD: 779 Ohm
RV LEAD IMPEDENCE ICD: 479 Ohm
RV LEAD THRESHOLD: 0.8 V
RV LEAD THRESHOLD: 1.2 V
TZON-0003FASTVT: 333.3 ms

## 2013-03-17 LAB — POCT INR: INR: 2.3

## 2013-03-17 MED ORDER — ZOLPIDEM TARTRATE 10 MG PO TABS: 10.0000 mg | ORAL_TABLET | Freq: Every evening | ORAL | Status: DC | PRN

## 2013-03-17 NOTE — Patient Instructions (Signed)
Return to office in 4 weeks  Continue current dosing:  5 mg daily except 7 mg on Wed

## 2013-03-17 NOTE — Progress Notes (Signed)
Patient Care Team: Wanda Plump, MD as PCP - General   HPI  [name] is a 62 y.o. female seen in followup for congestive heart failure in the setting of chemotherapy associated cardiomyopathy with previously implanted CRT. She did part of the MADIT CRT protocol; her LV lead had failed and was capped she began to develop worsening symptoms of heart failure in the fall and in the winter 2014  In 3/14 she underwent CRT upgrade. Her LV lead had become nonfunctional.  She is much improved following implantation last month. She is going from class IIIB to class II. She is exceedingly pleased.  Past Medical History  Diagnosis Date  . Breast CA     twice first on L breast 1982 w/ chest wall involvment, then a second breast ca on the R in 1987  . Chronic systolic CHF (congestive heart failure)     a. due to Adriamycin,s/p ICD;  b. 01/2013 Gen change and new LV lead - [employer] CRT-D BiV ICD, Ser # [id number]  . Nonischemic cardiomyopathy   . Insomnia   . Headache   . Diverticulitis   . Osteopenia   . Renal calculus   . Headache   . Menopause     since age 64s  . Depression     Past Surgical History  Procedure Laterality Date  . Mastectomy      B  . Oophorectomy      B, per genticists advise at [facility]  . Cardiac defibrillator placement      AICD Replaced-5/09    Current Outpatient Prescriptions  Medication Sig Dispense Refill  . benzonatate (TESSALON) 200 MG capsule One every 4-6 hours as needed  90 capsule  0  . Biotin 5000 MCG CAPS Take 1 capsule by mouth daily.      . carvedilol (COREG) 6.25 MG tablet Take 1 tablet (6.25 mg total) by mouth 2 (two) times daily with a meal.  60 tablet  12  . Cyanocobalamin 1500 MCG TBDP Take 1 tablet by mouth daily.      Marland Kitchen Dextromethorphan Polistirex (DELSYM PO) Take 10 mLs by mouth every 12 (twelve) hours as needed. For cough      . famotidine (PEPCID) 20 MG tablet Take 20 mg by mouth 2 (two) times daily. One at bedtime      .  furosemide (LASIX) 40 MG tablet Take 40 mg by mouth 2 (two) times daily.      . multivitamin (THERAGRAN) per tablet Take 1 tablet by mouth daily.       Marland Kitchen olmesartan (BENICAR) 20 MG tablet Take 0.5 tablets (10 mg total) by mouth daily. One half daily  15 tablet  11  . pantoprazole (PROTONIX) 40 MG tablet Take 40 mg by mouth daily. Take 30-60 min before first meal of the day      . potassium chloride (KLOR-CON) 10 MEQ CR tablet Take 10 mEq by mouth 2 (two) times daily.       . Probiotic Product (PROBIOTIC DAILY PO) Take 1 tablet by mouth daily.      . sertraline (ZOLOFT) 25 MG tablet Take 25 mg by mouth daily.      . traMADol (ULTRAM) 50 MG tablet Take 50 mg by mouth every 4 (four) hours as needed for pain. 1-2 every 4 hours as needed for cough or pain      . warfarin (COUMADIN) 2 MG tablet Take 1 tablet (2 mg total) by mouth every 7 (seven)  days. On Wednesdays only pt takes 2 mg Coumadin with 5 mg Coumadin  30 tablet  1  . warfarin (COUMADIN) 5 MG tablet Take 1 tablet (5 mg total) by mouth daily. Takes Daily. On Wednesdays takes with an additional 2 mg tablet for total of 7 mg      . zolpidem (AMBIEN) 10 MG tablet Take 0.5 tablets (5 mg total) by mouth at bedtime as needed. For sleep  90 tablet  1   No current facility-administered medications for this visit.    Allergies  Allergen Reactions  . Atacand (Candesartan)     Fatigue   . Cephalexin     REACTION: itching, hives  . Spironolactone     Fatigue   . Latex Rash    Review of Systems negative except from HPI and PMH  Physical Exam BP 96/51  Pulse 71  Ht 5\' 4"  (1.626 m)  Wt 179 lb (81.194 kg)  BMI 30.71 kg/m2  SpO2 100% Well developed and well nourished in no acute distress HENT normal E scleral and icterus clear Neck Supple JVP flat; carotids brisk and full Clear to ausculation Device pocket well healed; there is a fluid collection in the pocket. There is no erythema or rubor *Regular rate and rhythm, no murmurs gallops  or rub Soft with active bowel sounds No clubbing cyanosis none Edema Alert and oriented, grossly normal motor and sensory function Skin Warm and Dry    Assessment and  Plan

## 2013-03-17 NOTE — Assessment & Plan Note (Signed)
We will check a metabolic profile her today following CRT implantation.

## 2013-03-17 NOTE — Assessment & Plan Note (Signed)
This appears to be a seroma. I have expressed to the patient my concern about possibility of infection. We'll keep a close eye on it. She is to call Claudette Head if it becomes red and/or hot

## 2013-03-17 NOTE — Telephone Encounter (Signed)
done

## 2013-03-17 NOTE — Patient Instructions (Addendum)
Your physician recommends that you have lab work today: bmp  Your physician recommends that you schedule a follow-up appointment in: 2 months with Dr. Graciela Husbands.  Your physician recommends that you continue on your current medications as directed. Please refer to the Current Medication list given to you today.

## 2013-03-17 NOTE — Telephone Encounter (Signed)
Pt is requesting a refill for zolpidem 10mg . Pt stated that she was taking 1/2 tablet at bedtime but she has recently had to increase to 1 tablet at bed time. Last OV 1.29.14 Last filled 11.27.13

## 2013-03-21 ENCOUNTER — Other Ambulatory Visit: Payer: Self-pay | Admitting: Internal Medicine

## 2013-03-21 NOTE — Telephone Encounter (Signed)
Refill done.  

## 2013-04-03 ENCOUNTER — Other Ambulatory Visit: Payer: Self-pay | Admitting: Internal Medicine

## 2013-04-10 ENCOUNTER — Other Ambulatory Visit: Payer: Self-pay | Admitting: Internal Medicine

## 2013-04-26 ENCOUNTER — Ambulatory Visit (INDEPENDENT_AMBULATORY_CARE_PROVIDER_SITE_OTHER): Payer: Managed Care, Other (non HMO) | Admitting: Internal Medicine

## 2013-04-26 ENCOUNTER — Encounter: Payer: Self-pay | Admitting: Lab

## 2013-04-26 VITALS — BP 96/68 | HR 91 | Temp 99.1°F | Wt 185.0 lb

## 2013-04-26 DIAGNOSIS — Z7901 Long term (current) use of anticoagulants: Secondary | ICD-10-CM

## 2013-04-26 DIAGNOSIS — B029 Zoster without complications: Secondary | ICD-10-CM

## 2013-04-26 DIAGNOSIS — B0229 Other postherpetic nervous system involvement: Secondary | ICD-10-CM | POA: Insufficient documentation

## 2013-04-26 LAB — POCT INR: INR: 1.8

## 2013-04-26 MED ORDER — VALACYCLOVIR HCL 1 G PO TABS: 1000.0000 mg | ORAL_TABLET | Freq: Three times a day (TID) | ORAL | Status: DC

## 2013-04-26 MED ORDER — HYDROCODONE-ACETAMINOPHEN 5-325 MG PO TABS: 1.0000 | ORAL_TABLET | Freq: Four times a day (QID) | ORAL | Status: DC | PRN

## 2013-04-26 NOTE — Progress Notes (Signed)
  Subjective:    Patient ID: NYSA SARIN, female    DOB: May 12, 1951, 62 y.o.   MRN: [id number]  HPI Acute visit Symptoms started 5 days ago with a small blister between the right eye and the nose, the skin around  was very sensitive to touch, then she developed further blisters and rash in the forehead and scalp, lesions are on the right side.   Past Medical History: Breast cancer, twice (first on the L breast 1982 w/ chest wall involvment, then a second breast Ca on the R in 1987) Congestive heart failure, due to Adriamycin, s/p ICD Depression Insomnia Headache Diverticulitis, hx of Osteopenia RENAL CALCULUS, HX OF (ICD-V13.01) HEADACHE Menopause since her 30s   Past Surgical History: Mastectomy, B Oophorectomy B,  per a geneticists advise at [facility] Defibrillator-AICD Replaced 5/09  Family History: MI--no DM--no Kidney Ca-- brother Colon Cancer:no Breast Cancer-- Mother, Sister  Social History: Married, 3 children all in [place], 7 Gkids   related to Mr and [name] , they are my patients as well Never Smoked Alcohol use- socially   Illicit Drug Use - no Diet-- healthy most of the time   Patient does not get regular exercise but is very active   pre school director    Review of Systems No fever or chills . Some pain at the area of the rash. Vision is good, not blurred No  eye pain per se, some conjunctival discomfort. No photophobia No ear pain or  rash.    Objective:   Physical Exam  HENT:  Head:     BP 96/68  Pulse 91  Temp(Src) 99.1 F (37.3 C) (Oral)  Wt 185 lb (83.915 kg)  BMI 31.74 kg/m2  SpO2 97%  General -- alert, well-developed, NAD .    HEENT -- EOMI PERLA, right conjunctiva is moderately erythematous, clear discharge. Anterior chambers clear Neurologic-- alert & oriented X3 , speech gait and motor are intact, facial strength is symmetric. Psych-- Cognition and judgment appear intact. Alert and cooperative with normal attention  span and concentration.  not anxious appearing and not depressed appearing.       Assessment & Plan:

## 2013-04-26 NOTE — Assessment & Plan Note (Signed)
current dosing: 5 mg daily except 7 mg on Wed inr 1.8 New dosing: 5 mg daily except 7 mg on Wednesday and Friday Next INR 2 weeks

## 2013-04-26 NOTE — Patient Instructions (Addendum)
Valtrex for one week. Use preservative-free artificial tears likes Systane every 6 hours until you see Dr. Connye Burkitt tomorrow at 9 AM. Call anytime if the rash gets worse or you have  difficulty with the vision. --- New dosing: 5 mg daily except 7 mg on Wednesday and Friday Next INR 2 weeks  --- Shingles Shingles (herpes zoster) is an infection that is caused by the same virus that causes chickenpox (varicella). The infection causes a painful skin rash and fluid-filled blisters, which eventually break open, crust over, and heal. It may occur in any area of the body, but it usually affects only one side of the body or face. The pain of shingles usually lasts about 1 month. However, some people with shingles may develop long-term (chronic) pain in the affected area of the body. Shingles often occurs many years after the person had chickenpox. It is more common:  In people older than 50 years.  In people with weakened immune systems, such as those with HIV, AIDS, or cancer.  In people taking medicines that weaken the immune system, such as transplant medicines.  In people under great stress. CAUSES  Shingles is caused by the varicella zoster virus (VZV), which also causes chickenpox. After a person is infected with the virus, it can remain in the person's body for years in an inactive state (dormant). To cause shingles, the virus reactivates and breaks out as an infection in a nerve root. The virus can be spread from person to person (contagious) through contact with open blisters of the shingles rash. It will only spread to people who have not had chickenpox. When these people are exposed to the virus, they may develop chickenpox. They will not develop shingles. Once the blisters scab over, the person is no longer contagious and cannot spread the virus to others. SYMPTOMS  Shingles shows up in stages. The initial symptoms may be pain, itching, and tingling in an area of the skin. This pain is  usually described as burning, stabbing, or throbbing.In a few days or weeks, a painful red rash will appear in the area where the pain, itching, and tingling were felt. The rash is usually on one side of the body in a band or belt-like pattern. Then, the rash usually turns into fluid-filled blisters. They will scab over and dry up in approximately 2 3 weeks. Flu-like symptoms may also occur with the initial symptoms, the rash, or the blisters. These may include:  Fever.  Chills.  Headache.  Upset stomach. DIAGNOSIS  Your caregiver will perform a skin exam to diagnose shingles. Skin scrapings or fluid samples may also be taken from the blisters. This sample will be examined under a microscope or sent to a lab for further testing. TREATMENT  There is no specific cure for shingles. Your caregiver will likely prescribe medicines to help you manage the pain, recover faster, and avoid long-term problems. This may include antiviral drugs, anti-inflammatory drugs, and pain medicines. HOME CARE INSTRUCTIONS   Take a cool bath or apply cool compresses to the area of the rash or blisters as directed. This may help with the pain and itching.   Only take over-the-counter or prescription medicines as directed by your caregiver.   Rest as directed by your caregiver.  Keep your rash and blisters clean with mild soap and cool water or as directed by your caregiver.  Do not pick your blisters or scratch your rash. Apply an anti-itch cream or numbing creams to the affected area as  directed by your caregiver.  Keep your shingles rash covered with a loose bandage (dressing).  Avoid skin contact with:  Babies.   Pregnant women.   Children with eczema.   Elderly people with transplants.   People with chronic illnesses, such as leukemia or AIDS.   Wear loose-fitting clothing to help ease the pain of material rubbing against the rash.  Keep all follow-up appointments with your caregiver.If  the area involved is on your face, you may receive a referral for follow-up to a specialist, such as an eye doctor (ophthalmologist) or an ear, nose, and throat (ENT) doctor. Keeping all follow-up appointments will help you avoid eye complications, chronic pain, or disability.  SEEK IMMEDIATE MEDICAL CARE IF:   You have facial pain, pain around the eye area, or loss of feeling on one side of your face.  You have ear pain or ringing in your ear.  You have loss of taste.  Your pain is not relieved with prescribed medicines.   Your redness or swelling spreads.   You have more pain and swelling.  Your condition is worsening or has changed.   You have a feveror persistent symptoms for more than 2 3 days.  You have a fever and your symptoms suddenly get worse. MAKE SURE YOU:  Understand these instructions.  Will watch your condition.  Will get help right away if you are not doing well or get worse. Document Released: 11/10/2005 Document Revised: 08/04/2012 Document Reviewed: 06/24/2012 [facility] Patient Information 2014 [place], [place].

## 2013-04-26 NOTE — Assessment & Plan Note (Addendum)
Symptoms consistent with shingles, had a Zostavax 10/2012. Needs to see ophthalmology ASAP, will see them tomorrow at 9 AM. Will start Valtrex. pain control w/ hydrococone Consider gabapentin See instructions  Addendum d/w Dr Davis--opthalmology-- artificial tears for now

## 2013-04-27 ENCOUNTER — Encounter: Payer: Self-pay | Admitting: Internal Medicine

## 2013-05-15 ENCOUNTER — Other Ambulatory Visit: Payer: Self-pay | Admitting: Internal Medicine

## 2013-05-16 ENCOUNTER — Encounter: Payer: Self-pay | Admitting: Internal Medicine

## 2013-05-16 ENCOUNTER — Ambulatory Visit (INDEPENDENT_AMBULATORY_CARE_PROVIDER_SITE_OTHER): Payer: Managed Care, Other (non HMO) | Admitting: Internal Medicine

## 2013-05-16 ENCOUNTER — Other Ambulatory Visit: Payer: Self-pay | Admitting: *Deleted

## 2013-05-16 VITALS — BP 102/67 | HR 86 | Ht 64.0 in | Wt 181.0 lb

## 2013-05-16 DIAGNOSIS — I25810 Atherosclerosis of coronary artery bypass graft(s) without angina pectoris: Secondary | ICD-10-CM

## 2013-05-16 DIAGNOSIS — I5022 Chronic systolic (congestive) heart failure: Secondary | ICD-10-CM

## 2013-05-16 DIAGNOSIS — Z95810 Presence of automatic (implantable) cardiac defibrillator: Secondary | ICD-10-CM

## 2013-05-16 DIAGNOSIS — I472 Ventricular tachycardia, unspecified: Secondary | ICD-10-CM

## 2013-05-16 DIAGNOSIS — Z7901 Long term (current) use of anticoagulants: Secondary | ICD-10-CM

## 2013-05-16 LAB — ICD DEVICE OBSERVATION
AL AMPLITUDE: 4.6 mv
AL IMPEDENCE ICD: 559 Ohm
AL THRESHOLD: 0.7 V
CHARGE TIME: 9 s
DEV-0020ICD: NEGATIVE
HV IMPEDENCE: 55 Ohm
LV LEAD THRESHOLD: 0.8 V
RV LEAD AMPLITUDE: 25 mv
RV LEAD IMPEDENCE ICD: 482 Ohm
RV LEAD THRESHOLD: 1.2 V

## 2013-05-16 MED ORDER — WARFARIN SODIUM 5 MG PO TABS: ORAL_TABLET | ORAL | Status: DC

## 2013-05-16 NOTE — Patient Instructions (Addendum)
Your physician wants you to follow-up in: March 2015 with Dr. Graciela Husbands. You will receive a reminder letter in the mail two months in advance. If you don't receive a letter, please call our office to schedule the follow-up appointment.  Your physician recommends that you continue on your current medications as directed. Please refer to the Current Medication list given to you today.

## 2013-05-16 NOTE — Assessment & Plan Note (Signed)
The patient's device was interrogated.  The information was reviewed. No changes were made in the programming.   Pocket seroma stable

## 2013-05-16 NOTE — Assessment & Plan Note (Signed)
Stable class II heart failure

## 2013-05-16 NOTE — Assessment & Plan Note (Signed)
No VT

## 2013-05-16 NOTE — Telephone Encounter (Signed)
Refill for warfarin sent to [facility] on [address]

## 2013-05-16 NOTE — Assessment & Plan Note (Signed)
As above. Is not clear irregularly medication that seems to have probably been LV thrombus. We're currently making effort To track down her paper chart to look at device interrogation  for evidence of atrial fibrillation. In the event that this is not found, it would be reasonable to consider discontinuing Coumadin. I will defer this to Dr. Donnie Aho

## 2013-05-16 NOTE — Progress Notes (Signed)
Patient Care Team: Alejandra Plump, MD as PCP - General   HPI  [name] is a 62 y.o. female seen in followup for congestive heart failure in the setting of chemotherapy associated cardiomyopathy with previously implanted CRT. She did part of the MADIT CRT protocol; her LV lead had failed and was capped she began to develop worsening symptoms of heart failure in the fall and in the winter 2014  In 3/14 she underwent CRT upgrade. Her LV lead had become nonfunctional. She is much improved following implantation last month. She is going from class IIIB to class II. She was exceedingly pleased the last month has been a derailed by shingles which are gradually improving.  .she also asks whether she should be on Coumadin. She says that she has been on it since she was diagnosed with congestive heart failure. Reviewing the records that I can find in epic notes that she has been on it at the time of initial defibrillator implantation in 2005. Reviewing the defibrillator records which we have which are only back since2013 there is no record of significant atrial fibrillation and I wonder whether the initial indication wasn't LV thrombus. If that were the case, it would be a reasonable thing to discontinue anticoagulation     Past Medical History  Diagnosis Date  . Breast CA     twice first on L breast 1982 w/ chest wall involvment, then a second breast ca on the R in 1987  . Chronic systolic CHF (congestive heart failure)     a. due to Adriamycin,s/p ICD;  b. 01/2013 Gen change and new LV lead - BSX Energen CRT-D BiV ICD, Ser # [id number]  . Nonischemic cardiomyopathy   . Insomnia   . Headache(784.0)   . Diverticulitis   . Osteopenia   . Renal calculus   . Headache(784.0)   . Menopause     since age 1s  . Depression     Past Surgical History  Procedure Laterality Date  . Mastectomy      B  . Oophorectomy      B, per genticists advise at [facility]  . Cardiac defibrillator placement       AICD Replaced-5/09    Current Outpatient Prescriptions  Medication Sig Dispense Refill  . ascorbic acid (VITAMIN C) 1000 MG tablet Take 1,000 mg by mouth daily.      . benzonatate (TESSALON) 200 MG capsule One every 4-6 hours as needed  90 capsule  0  . Biotin 5000 MCG CAPS Take 1 capsule by mouth daily.      . carvedilol (COREG) 6.25 MG tablet Take 1 tablet (6.25 mg total) by mouth 2 (two) times daily with a meal.  60 tablet  12  . famciclovir (FAMVIR) 500 MG tablet Take 500 mg by mouth 3 (three) times daily.      . famotidine (PEPCID) 20 MG tablet TAKE  ONE TABLET BY MOUTH NIGHTLY AT BEDTIME  30 tablet  11  . furosemide (LASIX) 40 MG tablet Take 40 mg by mouth 2 (two) times daily.      Marland Kitchen HYDROcodone-acetaminophen (NORCO/VICODIN) 5-325 MG per tablet Take 1 tablet by mouth every 6 (six) hours as needed for pain.  30 tablet  0  . Multiple Minerals-Vitamins (CALCIUM & VIT D3 BONE HEALTH PO) Take 600 capsules by mouth daily.      . multivitamin (THERAGRAN) per tablet Take 1 tablet by mouth daily.       Marland Kitchen olmesartan (BENICAR)  20 MG tablet Take 0.5 tablets (10 mg total) by mouth daily. One half daily  15 tablet  11  . pantoprazole (PROTONIX) 40 MG tablet TAKE 1 TABLET (40 MG TOTAL) BY MOUTH DAILY. TAKE 30-60 MIN BEFORE FIRST MEAL OF THE DAY  30 tablet  11  . potassium chloride (KLOR-CON) 10 MEQ CR tablet Take 10 mEq by mouth 2 (two) times daily.       . Probiotic Product (PROBIOTIC DAILY PO) Take 1 tablet by mouth daily.      . sertraline (ZOLOFT) 25 MG tablet TAKE ONE TABLET BY MOUTH DAILY  90 tablet  1  . vitamin B-12 (CYANOCOBALAMIN) 1000 MCG tablet Take 1,000 mcg by mouth daily.      Marland Kitchen warfarin (COUMADIN) 5 MG tablet Take 5 mg by mouth daily. TAKE ONE TABLET BY MOUTH DAILY. TAKE AN ADDITIONAL 2 MG WARFARIN WITH5 MG TABLET  ON WEDNESDAY AND FRIDAY ONLY      . zolpidem (AMBIEN) 10 MG tablet Take 1 tablet (10 mg total) by mouth at bedtime as needed. For sleep  90 tablet  1   No current  facility-administered medications for this visit.    Allergies  Allergen Reactions  . Atacand (Candesartan)     Fatigue   . Cephalexin     REACTION: itching, hives  . Spironolactone     Fatigue   . Latex Rash    Review of Systems negative except from HPI and PMH  Physical Exam BP 102/67  Pulse 86  Ht 5\' 4"  (1.626 m)  Wt 181 lb (82.101 kg)  BMI 31.05 kg/m2 Well developed and well nourished in no acute distress HENT normal E scleral and icterus clear Neck Supple JVP flat; carotids brisk and full Clear to ausculation Pocket seroma persists without evidence of infection  Regular rate and rhythm, no murmurs gallops or rub Soft with active bowel sounds No clubbing cyanosis none Edema Alert and oriented, grossly normal motor and sensory function Skin Warm and Dry; erythematous discoloration of the right side of her face    Assessment and  Plan

## 2013-05-24 ENCOUNTER — Telehealth: Payer: Self-pay | Admitting: Internal Medicine

## 2013-05-24 MED ORDER — GABAPENTIN 100 MG PO CAPS: 100.0000 mg | ORAL_CAPSULE | Freq: Three times a day (TID) | ORAL | Status: DC | PRN

## 2013-05-24 NOTE — Telephone Encounter (Signed)
Discuss with patient  

## 2013-05-24 NOTE — Telephone Encounter (Signed)
Please advise 

## 2013-05-24 NOTE — Telephone Encounter (Signed)
Left message to call office, Rx sent. 

## 2013-05-24 NOTE — Telephone Encounter (Signed)
Assess response to the gabapentin  100 mg one every 8 hours as needed ;#30.No cream available to my knowledge.

## 2013-05-24 NOTE — Telephone Encounter (Signed)
Patient was seen about a month ago for the shingles and states that she is still experiencing pain and discomfort around her nose and facial area. Wants to know if there is a cream she can use to help with this.

## 2013-05-31 ENCOUNTER — Telehealth: Payer: Self-pay | Admitting: Internal Medicine

## 2013-05-31 NOTE — Telephone Encounter (Signed)
Due for  INR, please arrange 

## 2013-06-02 NOTE — Telephone Encounter (Signed)
Patient states that she is going to see her heart doctor on Wednesday 06/08/13 and thinks he is going to take her off Coumadin. She will check with him and if he does not take her off, she will call us back to set up an appointment.

## 2013-06-08 ENCOUNTER — Telehealth: Payer: Self-pay | Admitting: *Deleted

## 2013-06-08 ENCOUNTER — Encounter: Payer: Self-pay | Admitting: Cardiology

## 2013-06-08 MED ORDER — GABAPENTIN 100 MG PO CAPS: 200.0000 mg | ORAL_CAPSULE | Freq: Three times a day (TID) | ORAL | Status: DC

## 2013-06-08 NOTE — Telephone Encounter (Signed)
Pt still c/o nerve pain on fore head and nose. Pt would like to get refill on gabapentin 100 mg.Pt seen on 04-26-13 and Dx with shingles. Pt Rx gabapentin 100 mg   on 05-24-13 Please advise

## 2013-06-08 NOTE — Telephone Encounter (Signed)
Sent a prescription for gabapentin 100 mg 2 tablets 3 times a day. If she feels like that is too much medicine, she can go back to one tablet 3 times a day. If pain continue, needs office visit.

## 2013-06-08 NOTE — Progress Notes (Signed)
Patient ID: [name], female   DOB: 1951-07-20, 62 y.o.   MRN: [id number]   Juda, Lajeunesse  Date of visit:  06/08/2013 DOB:  May 22, 1951    Age:  61 yrs. Medical record number:  [id number]     Account number:  [id number] Primary Care Provider: Willow Ora ____________________________ CURRENT DIAGNOSES  1. Congestive Heart Failure Chronic Systolic  2. Cardiomyopathy Idiopathic  3. Long-term (current) Use Of Anticoagulants  4. Aortic Valve Disorder  5. AICD in situ  6. Obesity(BMI30-40)  7. Personal History Of Malignant Neoplasm Of Breast ____________________________ ALLERGIES  Atacand, Intolerance-dizziness  Spironolactone, Weakness present ____________________________ MEDICATIONS  1. Zoloft 25 mg tablet, QHS  2. carvedilol 6.25 mg tablet, BID  3. zolpidem 10 mg tablet, 1/2 qhs  4. furosemide 40 mg tablet, BID  5. Klor-Con 10 10 mEq tablet extended release, B.I.D.  6. Benicar 20 mg tablet, 1 p.o. daily  7. pantoprazole 40 mg recon soln, 1 p.o. daily  8. gabapentin 100 mg capsule, PRN  9. famotidine 20 mg tablet, QHS ____________________________ CHIEF COMPLAINTS  Followup of Cardiomyopathy Idiopathic ____________________________ HISTORY OF PRESENT ILLNESS  Patient seen for evaluation of cardiomyopathy and anticoagulation. She has been doing relatively well since her upgrade to a biventricular ICD. She continues to have episodes of dyspnea and is now getting over a recent episode of the shingles. She complains of some fatigue and also had significant pneumonia earlier this year so overall this has been a difficult year. She is still limited somewhat in what she is able to do and finds it difficult to have stamina. She does feel that her breathing has improved and she has edema only rarely now. She has no PND, orthopnea or and has only a mild amount of edema now. She asks about the need for continued anticoagulation for cardiomyopathy. Her records were reviewed going back to 1991.  She was started on anticoagulation in the late 90s for a cardiomyopathy which was standard of care at this time. She had not been seen for a couple of years when the guidelines changed and we had not addressed at since she started seeing me again in 2013. She has never had atrial fibrillation and never had a stroke. Her cardiomyopathy is thought to do to the combination of chemotherapy and radiation therapy for breast cancer. ____________________________ PAST HISTORY  Past Medical Illnesses:  breast cancer 1982, 1986 tx with surgery, radiation, and chemo, obesity, nephrolithiasis, lumbar disc disease, shingles;  Cardiovascular Illnesses:  cardiomyopathy(idiopathic), conduction disorder-LBBB, arrhythmia-PVCs;  Surgical Procedures:  biil breast Implants, breast mastectomy rt, breast mastectomy left, hysterectomy-total, repair left thumb laceration;  Cardiology Procedures-Invasive:  Guidant Vitality implant 03/24/08 Dr. Graciela Husbands, cardiac cath (left) July 2009, upgrade to biv defibrillator and new generator 3/14;  Cardiology Procedures-Noninvasive:  echocardiogram April 2010, event monitor April 2010, echocardiogram March 2013, echocardiogram October 2013, echocardiogram July 2014;  Cardiac Cath Results:  no significant disease, LAD-PA fistula;  LVEF of 25% documented via echocardiogram on 06/08/2013,   ____________________________ CARDIO-PULMONARY TEST DATES EKG Date:  09/13/2012;   Cardiac Cath Date:  05/25/2008;  Holter/Event Monitor Date: 03/14/2009;  Echocardiography Date: 06/08/2013;  Chest Xray Date: 03/14/2009;   ____________________________ SOCIAL HISTORY Alcohol Use:  socially;  Smoking:  never smoked;  Diet:  regular diet without modifications;  Lifestyle:  married and 3 children;  Exercise:  some exercise;  Occupation:  Interior and spatial designer at [employer];  Residence:  lives with husband;   ____________________________ REVIEW OF SYSTEMS General:  obesity, weight loss of  11 pounds, malaise and fatigue   Integumentary: recent shingles Eyes: wears eye glasses/contact lenses, denies diplopia, history of glaucoma or visual field defects. Respiratory: mild dyspnea with exertion Cardiovascular:  please review HPI Abdominal: reduced appetite Genitourinary-Female: no dysuria, urgency, frequency, UTIs, or stress incontinenceNeurological:  denies headaches, stroke, or TIA  ____________________________ PHYSICAL EXAMINATION VITAL SIGNS  Blood Pressure:  84/60 Sitting, Left arm, regular cuff   Pulse:  80/min. Weight:  181.00 lbs. Height:  64"BMI: 31  Constitutional:  pleasant white female, in no acute distress, mildly obese Skin:  warm and dry to touch, no apparent skin lesions, or masses noted. ENT:  ears, nose and throat reveal no gross abnormalities.  Dentition good. Neck:  supple, no masses, thyromegaly, JVD. Carotid pulses are full and equal bilaterally without bruits. Chest:  normal symmetry, clear to auscultation and percussion., healed ICD incision in the left pectoral area Cardiac:  regular rhythm, normal S1 and S2, no S3 or S4, grade 2/6 systolic murmur heard best at the base Abdomen:  abdomen soft,non-tender, no masses, no hepatospenomegaly, or aneurysm noted Peripheral Pulses:  the femoral,dorsalis pedis, and posterior tibial pulses are full and equal bilaterally with no bruits auscultated. Extremities & Back:  no edema present Neurological:  no gross motor or sensory deficits noted, affect appropriate, oriented x3. ____________________________ MOST RECENT LIPID PANEL 08/17/12  CHOL TOTL 195 mg/dl, HDL 50 mg/dl, TRIGLYCER 92 mg/dl and CHOL/HDL 4 (Calc) ____________________________ IMPRESSIONS/PLAN  1. Dilated cardiomyopathy thought due to late sequelae of cancer treatment 2. Functioning biventricular ICD 3. Moderate fatigue and malaise 4. Long-term anticoagulation with warfarin indications previously for cardiomyopathy  Recommendations:  I think we can discontinue her warfarin at this  time. On review of the guidelines and her clinical status there is no indication to continue it or to start aspirin. Her echocardiogram today shows improvement in her ventricular dyssynchrony and an ejection fraction of around 25%. She is having increasing difficulty doing her normal activities at work and may need to go on disability. ____________________________ TODAYS ORDERS  1. Return Visit: 6 months                       ____________________________ Cardiology Physician:  Darden Palmer MD [facility]

## 2013-06-08 NOTE — Telephone Encounter (Signed)
lmovm to return call  °

## 2013-06-09 NOTE — Telephone Encounter (Signed)
Discussed with patient. Verbalized understanding.

## 2013-07-05 ENCOUNTER — Other Ambulatory Visit: Payer: Self-pay | Admitting: Internal Medicine

## 2013-07-05 NOTE — Telephone Encounter (Signed)
Refill done per protocol.  

## 2013-07-06 ENCOUNTER — Other Ambulatory Visit: Payer: Self-pay | Admitting: Internal Medicine

## 2013-07-07 NOTE — Telephone Encounter (Signed)
Two month supply called in on 06/08/13. Refill requested too soon.

## 2013-07-13 ENCOUNTER — Encounter: Payer: Self-pay | Admitting: Internal Medicine

## 2013-07-13 ENCOUNTER — Ambulatory Visit (INDEPENDENT_AMBULATORY_CARE_PROVIDER_SITE_OTHER): Payer: Managed Care, Other (non HMO) | Admitting: Internal Medicine

## 2013-07-13 VITALS — BP 110/70 | HR 82 | Temp 98.2°F | Wt 185.2 lb

## 2013-07-13 DIAGNOSIS — B029 Zoster without complications: Secondary | ICD-10-CM

## 2013-07-13 MED ORDER — LIDOCAINE 5 % EX PTCH: 1.0000 | MEDICATED_PATCH | CUTANEOUS | Status: DC

## 2013-07-13 MED ORDER — GABAPENTIN 300 MG PO CAPS: 300.0000 mg | ORAL_CAPSULE | Freq: Three times a day (TID) | ORAL | Status: DC

## 2013-07-13 MED ORDER — HYDROCODONE-ACETAMINOPHEN 5-325 MG PO TABS: 1.0000 | ORAL_TABLET | Freq: Four times a day (QID) | ORAL | Status: DC | PRN

## 2013-07-13 NOTE — Patient Instructions (Signed)
One patch on the area of pain daily for up to 12 hours, don't let it get close to the eye. Neurontin 3 times a day ( twice a day if so desired) Hydrocodone as needed, only for severe pain. Please come back in 3 months for a general checkup

## 2013-07-13 NOTE — Assessment & Plan Note (Signed)
See previous entry, diagnosed with shingles 04/2013. She still sees ophthalmology regularly and uses eyedrops, pupil is slightly larger on the right, likely related to the eye drop. Her current problem is pain control, she has been reluctant to take Neurontin but I explained the patient that this is a good way to treat her pain in a safe way. Plan: Neurontin, see instructions Lidoderm to the forehead, avoid eye contact Hydrocodone as needed Will call if this plan is not working

## 2013-07-13 NOTE — Progress Notes (Signed)
  Subjective:    Patient ID: [name], female    DOB: 29-May-1951, 62 y.o.   MRN: [id number]  HPI Here for followup Diagnosed with shingles a few weeks ago, rash is better, still having significant amount of pain in the area of the shingles. She did try Neurontin, as prescribed: 200 mg 3 times a day, now is taking it only sometimes, she is concerned about chronic use of Neurontin. Run out of hydrocodone.   Past Medical History  Diagnosis Date  . Breast CA     twice first on L breast 1982 w/ chest wall involvment, then a second breast ca on the R in 1987  . Chronic systolic CHF (congestive heart failure)     a. due to Adriamycin,s/p ICD;  b. 01/2013 Gen change and new LV lead - BSX Energen CRT-D BiV ICD, Ser # [id number]  . Nonischemic cardiomyopathy   . Diverticulitis   . Osteopenia   . Renal calculus   . Menopause     since age 36s  . Depression    Past Surgical History  Procedure Laterality Date  . Mastectomy      B  . Oophorectomy      B, per genticists advise at [facility]  . Cardiac defibrillator placement      AICD Replaced-5/09    Review of Systems Still sees the eye doctor ref shingles, uses an eye drop (name?) Vision is normal. No headache, nausea, vomiting.    Objective:   Physical Exam  HENT:  Head:      General -- alert, well-developed, NAD.   HEENT--   Face symmetric, sinuses not tender to palpation. Nose not congested.no rash. EOMI, R pupil slt larger (related to eye drops?)  Neurologic-- alert & oriented X3. Speech, gait normal.  Psych-- Cognition and judgment appear intact. Alert and cooperative with normal attention span and concentration. not anxious appearing and not depressed appearing.         Assessment & Plan:

## 2013-07-14 ENCOUNTER — Encounter: Payer: Self-pay | Admitting: Internal Medicine

## 2013-09-01 ENCOUNTER — Telehealth: Payer: Self-pay | Admitting: Gastroenterology

## 2013-09-01 MED ORDER — CIPROFLOXACIN HCL 500 MG PO TABS: 500.0000 mg | ORAL_TABLET | Freq: Two times a day (BID) | ORAL | Status: DC

## 2013-09-01 MED ORDER — METRONIDAZOLE 500 MG PO TABS: 500.0000 mg | ORAL_TABLET | Freq: Two times a day (BID) | ORAL | Status: DC

## 2013-09-01 NOTE — Telephone Encounter (Signed)
Pt aware and scripts sent to the pharmacy. 

## 2013-09-01 NOTE — Telephone Encounter (Signed)
Shanda Bumps please see below. Is it ok to call in antibiotics for this pt?

## 2013-09-01 NOTE — Telephone Encounter (Signed)
Pt states she started having some pain in her left lower quadrant yesterday and thinks her diverticulitis is flaring. Pt would like some antibiotics called in, please advise.

## 2013-09-01 NOTE — Addendum Note (Signed)
Addended by: Selinda Michaels R on: 09/01/2013 02:39 PM   Modules accepted: Orders

## 2013-09-01 NOTE — Telephone Encounter (Signed)
Ok to give her Cipro 500 mg BID and Flagyl 500 mg BID for 10 days.  Thanks,  [facility]

## 2013-09-14 ENCOUNTER — Telehealth: Payer: Self-pay | Admitting: *Deleted

## 2013-09-14 ENCOUNTER — Other Ambulatory Visit: Payer: Self-pay | Admitting: *Deleted

## 2013-09-14 MED ORDER — ZOLPIDEM TARTRATE 10 MG PO TABS: 10.0000 mg | ORAL_TABLET | Freq: Every evening | ORAL | Status: DC | PRN

## 2013-09-14 NOTE — Telephone Encounter (Signed)
Ok 90 and 1 RF 

## 2013-09-14 NOTE — Telephone Encounter (Signed)
Med refilled.

## 2013-09-14 NOTE — Telephone Encounter (Signed)
zolpidem (AMBIEN) 10 MG tablet Last OV: 07/13/2013 Last refill: 03/17/2013 No contract on file

## 2013-09-19 ENCOUNTER — Ambulatory Visit
Admission: RE | Admit: 2013-09-19 | Discharge: 2013-09-19 | Disposition: A | Discharge status: Home / Self Care | Payer: Managed Care, Other (non HMO) | Source: Ambulatory Visit | Attending: Cardiology | Admitting: Cardiology

## 2013-09-19 ENCOUNTER — Encounter: Payer: Self-pay | Admitting: Cardiology

## 2013-09-19 ENCOUNTER — Other Ambulatory Visit: Payer: Self-pay | Admitting: Cardiology

## 2013-09-19 DIAGNOSIS — R0602 Shortness of breath: Secondary | ICD-10-CM

## 2013-09-19 NOTE — Progress Notes (Unsigned)
Patient ID: [name], female   DOB: 02-09-1951, 62 y.o.   MRN: [id number]   Alejandra, Kirby  Date of visit:  09/19/2013 DOB:  [birth year]    Age:  62 yrs. Medical record number: [id number] Account number:  [id number] Primary Care Provider: Willow Ora ____________________________ CURRENT DIAGNOSES  1. Congestive Heart Failure Chronic Systolic  2. Dyspnea  3. Cardiomyopathy Idiopathic  4. Long-term (current) Use Of Anticoagulants  5. Aortic Valve Disorder  6. AICD in situ  7. Personal History Of Malignant Neoplasm Of Breast  8. Obesity(BMI30-40)  9. Chest pain, other ____________________________ ALLERGIES  Atacand, Intolerance-dizziness  Spironolactone, Weakness present ____________________________ MEDICATIONS  1. Zoloft 25 mg tablet, QHS  2. carvedilol 6.25 mg tablet, BID  3. furosemide 40 mg tablet, BID  4. Klor-Con 10 10 mEq tablet extended release, B.I.D.  5. pantoprazole 40 mg recon soln, 1 p.o. daily  6. gabapentin 100 mg capsule, PRN  7. famotidine 20 mg tablet, QHS ____________________________ CHIEF COMPLAINTS  Dyspnea worse with activity  Pressure chest  worse with activity ____________________________ HISTORY OF PRESENT ILLNESS Patient seen early for evaluation of fatigue, shortness of breath and vague chest tightness. She has had progressive shortness of breath as well as some orthopnea and PND over the past 2-3 weeks. She has also noted a sensation of a tightness in her chest she walks or exerts herself. She has been using ibuprofen for her eye where she had the previous shingles. She is not really having a lot of edema. Her weight is 13 pounds up from previous. She is not really using a high salt diet and has been trying to watch her fluid intake. She thinks her urine output may have dropped some. ____________________________ PAST HISTORY  Past Medical Illnesses:  breast cancer 1982, 1986 tx with surgery, radiation, and chemo, obesity, nephrolithiasis, lumbar  disc disease, shingles;  Cardiovascular Illnesses:  cardiomyopathy(idiopathic), conduction disorder-LBBB, arrhythmia-PVCs;  Surgical Procedures:  biil breast Implants, breast mastectomy rt, breast mastectomy left, hysterectomy-total, repair left thumb laceration;  Cardiology Procedures-Invasive:  Guidant Vitality implant 03/24/08 Dr. Graciela Husbands, cardiac cath (left) July 2009, upgrade to biv defibrillator and new generator 3/14;  Cardiology Procedures-Noninvasive:  echocardiogram April 2010, event monitor April 2010, echocardiogram March 2013, echocardiogram October 2013, echocardiogram July 2014;  Cardiac Cath Results:  no significant disease, LAD-PA fistula;  LVEF of 25% documented via echocardiogram on 06/08/2013,   ____________________________ CARDIO-PULMONARY TEST DATES EKG Date:  09/19/2013;   Cardiac Cath Date:  05/25/2008;  Holter/Event Monitor Date: 03/14/2009;  Echocardiography Date: 06/08/2013;  Chest Xray Date: 03/14/2009;   ____________________________ FAMILY HISTORY Brother -- Neoplasm of kidney, Brother alive with problem Brother -- Coronary Artery Disease, Brother alive with problem Brother -- Brother alive and well Father -- Father dead, Malignant neoplasm of lung, Deceased Mother -- Mother dead, Carcinoma of breast, Deceased Sister -- Sister dead, Chronic obstructive lung disease, Carcinoma of breast ____________________________ SOCIAL HISTORY Alcohol Use:  socially;  Smoking:  never smoked;  Diet:  regular diet without modifications;  Lifestyle:  married and 3 children;  Exercise:  no regular exercise;  Occupation:  Interior and spatial designer at [employer];  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 Respiratory: dyspnea with exertion Cardiovascular:  please review HPI Abdominal: denies dyspepsia, GI  bleeding, constipation, or diarrheaGenitourinary-Female: no dysuria, urgency, frequency, UTIs, or stress incontinenceNeurological:  denies headaches, stroke, or  TIA  ____________________________ PHYSICAL EXAMINATION VITAL SIGNS  Blood Pressure:  82/60 Sitting, Left arm, regular cuff  , 80/68 Standing, Left arm and regular cuff   Pulse:  82/min. Weight:  194.00 lbs. Height:  64"BMI: 33  Constitutional:  pleasant white female, in no acute distress, mildly obese Skin:  warm and dry to touch, no apparent skin lesions, or masses noted. ENT:  ears, nose and throat reveal no gross abnormalities.  Dentition good. Neck:  supple, no masses, Carotid pulses are full and equal bilaterally without bruits.JVD increased in sitting position with V waves. Chest:  normal symmetry, clear to auscultation, healed ICD incision in the left pectoral area Cardiac:  regular rhythm, normal S1 and S2, no S3 or S4, grade 2/6 systolic murmur heard best at the base Abdomen:  abdomen soft,non-tender, no masses, no hepatospenomegaly, or aneurysm noted Peripheral Pulses:  the femoral,dorsalis pedis, and posterior tibial pulses are full and equal bilaterally with no bruits auscultated. Extremities & Back:  no edema present Neurological:  no gross motor or sensory deficits noted, affect appropriate, oriented x3. ____________________________ MOST RECENT LIPID PANEL 08/17/12  CHOL TOTL 195 mg/dl, HDL 50 mg/dl, TRIGLYCER 92 mg/dl and CHOL/HDL 4 (Calc) ____________________________ IMPRESSIONS/PLAN 1. Progressive shortness of breath and fatigue in a patient with prior nonischemic cardiomyopathy 2. Exertional chest tightness 3. Significant weight gain 4. Functioning biventricular pacemaker  Recommendations:  Her EKG shows her to be in sinus rhythm with a narrow complex paced beat. Unclear what the cause of her recent deterioration is. I recommended that she stop taking ibuprofen and because of her low blood pressure that she  stop Benicar. I would recommend that she increase her furosemide 80 mg in the morning and 40 in the evening and that she have a followup echocardiogram, chest x-ray, and lab as noted below. Obtain Lexiscan Myoview test to assess for possible ischemia as a contribution. ____________________________ TODAYS ORDERS  1. 12 Lead EKG: Today  2. Chest X-ray PA/Lat: today  3. Comprehensive Metabolic Panel: Today  4. Complete Blood Count: Today  5. BNP: Today  6. TSH: Today  7. 2D, color flow, doppler: First Available  8. Lexiscan 1 day: First Available                       ____________________________ Cardiology Physician:  Darden Palmer MD [facility]

## 2013-09-26 ENCOUNTER — Other Ambulatory Visit: Payer: Self-pay | Admitting: Internal Medicine

## 2013-09-27 NOTE — Telephone Encounter (Signed)
Carvedilol refill sent to pharmacy 

## 2013-09-28 ENCOUNTER — Encounter: Payer: Self-pay | Admitting: Cardiology

## 2013-09-28 NOTE — Progress Notes (Signed)
Patient ID: [name], female   DOB: 1951/08/10, 62 y.o.   MRN: [id number]   Alejandra, Kirby  Date of visit:  09/28/2013 DOB:  1951/03/02    Age:  62 yrs. Medical record number:  [id number]     Account number:  [id number] Primary Care Provider: Willow Ora ____________________________ CURRENT DIAGNOSES  1. Congestive Heart Failure Chronic Systolic  2. Dyspnea  3. Cardiomyopathy Idiopathic  4. Long-term (current) Use Of Anticoagulants  5. Aortic Valve Disorder  6. AICD in situ  7. Personal History Of Malignant Neoplasm Of Breast  8. Obesity(BMI30-40)  9. Chest pain, other ____________________________ ALLERGIES  Atacand, Intolerance-dizziness  Spironolactone, Weakness present ____________________________ MEDICATIONS  1. carvedilol 6.25 mg tablet, BID  2. furosemide 40 mg tablet, BID  3. Klor-Con 10 10 mEq tablet extended release, B.I.D.  4. pantoprazole 40 mg recon soln, 1 p.o. daily  5. gabapentin 100 mg capsule, PRN  6. famotidine 20 mg tablet, QHS  7. furosemide 40 mg tablet, 80 mg am  40mg  pm  8. Zoloft 50 mg tablet, 1 p.o. daily ____________________________ CHIEF COMPLAINTS  Followup of Cardiomyopathy Idiopathic  Followup of Dyspnea ____________________________ HISTORY OF PRESENT ILLNESS Patient seen for cardiac followup. She had a Lexiscan Myoview test that showed no ischemia but had a very low ejection fraction. Her echocardiogram done today shows a markedly dilated left ventricle with an EF of 10-15%. There appeared to be some dyssynchrony of the interventricular septum there was not present on a previous echo in June making me suspect that she may have had dislodgment of the previously placed left ventricular lead. She is feeling poorly with significant fatigue, exercise intolerance and her weight has been up. She has been taking a higher dose of furosemide. Her blood pressure is very low. She is really struggling with having to work. She has had a nonischemic  cardiomyopathy for a number of years since before 1998. Her previous LV lead was nonfunctional and he was put in as part of the study and she recently had upgrade of a left ventricular lead in March by Dr. Ladona Ridgel. Her edema is down and her weight is down from last week. ____________________________ PAST HISTORY  Past Medical Illnesses:  breast cancer 1982, 1986 tx with surgery, radiation, and chemo, obesity, nephrolithiasis, lumbar disc disease, shingles;  Cardiovascular Illnesses:  cardiomyopathy(idiopathic), conduction disorder-LBBB, arrhythmia-PVCs;  Surgical Procedures:  biil breast Implants, breast mastectomy rt, breast mastectomy left, hysterectomy-total, repair left thumb laceration;  Cardiology Procedures-Invasive:  Guidant Vitality implant 03/24/08 Dr. Graciela Husbands, cardiac cath (left) July 2009, upgrade to biv defibrillator and new generator 3/14;  Cardiology Procedures-Noninvasive:  echocardiogram April 2010, event monitor April 2010, echocardiogram March 2013, echocardiogram October 2013, echocardiogram July 2014, lexiscan cardiolite October 2014, echocardiogram November 2014;  Cardiac Cath Results:  no significant disease, LAD-PA fistula;  LVEF of 15% documented via echocardiogram on 09/28/2013,   ____________________________ CARDIO-PULMONARY TEST DATES EKG Date:  09/19/2013;   Cardiac Cath Date:  05/25/2008;  Holter/Event Monitor Date: 03/14/2009;  Nuclear Study Date:  09/22/2013;  Echocardiography Date: 09/28/2013;  Chest Xray Date: 03/14/2009;   ____________________________ FAMILY HISTORY Brother -- Neoplasm of kidney, Brother alive with problem Brother -- Coronary Artery Disease, Brother alive with problem Brother -- Brother alive and well Father -- Father dead, Malignant neoplasm of lung, Deceased Mother -- Mother dead, Carcinoma of breast, Deceased Sister -- Sister dead, Chronic obstructive lung disease, Carcinoma of breast ____________________________ SOCIAL HISTORY Alcohol Use:   socially;  Smoking:  never smoked;  Diet:  regular diet without modifications;  Lifestyle:  married and 3 children;  Exercise:  no regular exercise;  Occupation:  Interior and spatial designer at [employer];  Residence:  lives with husband;   ____________________________ PHYSICAL EXAMINATION VITAL SIGNS  Blood Pressure:  94/60 Sitting, Left arm, regular cuff  , 90/60 Standing, Left arm and regular cuff   Pulse:  80/min. Weight:  191.00 lbs. Height:  64"BMI: 33  Constitutional:  pleasant white female, in no acute distress, mildly obese Skin:  warm and dry to touch, no apparent skin lesions, or masses noted. Neck:  supple, no masses, thyromegaly, JVD. Carotid pulses are full and equal bilaterally without bruits. Chest:  normal symmetry, clear to auscultation, healed ICD incision in the left pectoral area Cardiac:  regular rhythm, normal S1 and S2, no S3 or S4, grade 2/6 systolic murmur heard best at the base Peripheral Pulses:  the femoral,dorsalis pedis, and posterior tibial pulses are full and equal bilaterally with no bruits auscultated. Extremities & Back:  1+ edema Neurological:  no gross motor or sensory deficits noted, affect appropriate, oriented x3. ____________________________ MOST RECENT LIPID PANEL 08/17/12  CHOL TOTL 195 mg/dl, HDL 50 mg/dl, TRIGLYCER 92 mg/dl and CHOL/HDL 4 (Calc) ____________________________ IMPRESSIONS/PLAN  1. Prior nonischemic cardiomyopathy normal coronary arteries and LAD PA fistula in 2009 on most recent cath 2. Significant malaise and fatigue 3. Significant depression of LV function with evidence of dyssynchrony now on echo suggesting malfunctioning LV lead  Recommendations:  Her ventricular function is deteriorating and she is having a lot more mitral regurgitation now has moderate pulmonary hypertension with pressures of around 50. Her blood pressure has been low.  I recommended that she have a consultation with Dr. Jones Broom at the advanced heart failure clinic  and that she have a consultation with Dr. Graciela Husbands to discuss whether or her LV lead is working and whether she could have optimization of the lead. This is the worst that I have seen her in some time.  ____________________________ Cardiology Physician:  Darden Palmer MD [facility]

## 2013-09-30 ENCOUNTER — Encounter: Payer: Self-pay | Admitting: Internal Medicine

## 2013-09-30 ENCOUNTER — Ambulatory Visit (INDEPENDENT_AMBULATORY_CARE_PROVIDER_SITE_OTHER): Payer: Managed Care, Other (non HMO) | Admitting: *Deleted

## 2013-09-30 DIAGNOSIS — Z95810 Presence of automatic (implantable) cardiac defibrillator: Secondary | ICD-10-CM

## 2013-09-30 NOTE — Progress Notes (Signed)
LV threshold test per Dr. Vance Peper consistent with previous measurement at 0.8V @ 0.25ms. Impedance consistent actively measuring [id number] ohms. R-waves varied from last auto measurement---auto measure saw 1.74mV; today measured 11.38mV.  No programming changes made.  Pt Bi-V pacing 99% of time.  4 NSVT since 05/16/13 check---longest with EGM 9 beats.

## 2013-10-04 ENCOUNTER — Encounter (HOSPITAL_COMMUNITY): Payer: Self-pay | Admitting: Pharmacy Technician

## 2013-10-04 ENCOUNTER — Encounter (HOSPITAL_COMMUNITY): Payer: Self-pay | Admitting: *Deleted

## 2013-10-04 ENCOUNTER — Ambulatory Visit (HOSPITAL_COMMUNITY)
Admission: RE | Admit: 2013-10-04 | Discharge: 2013-10-04 | Disposition: A | Discharge status: Home / Self Care | Payer: Managed Care, Other (non HMO) | Source: Ambulatory Visit | Attending: Internal Medicine | Admitting: Internal Medicine

## 2013-10-04 ENCOUNTER — Encounter (HOSPITAL_COMMUNITY): Payer: Self-pay

## 2013-10-04 VITALS — BP 110/72 | HR 92 | Ht 65.0 in | Wt 191.1 lb

## 2013-10-04 DIAGNOSIS — F3289 Other specified depressive episodes: Secondary | ICD-10-CM | POA: Insufficient documentation

## 2013-10-04 DIAGNOSIS — F329 Major depressive disorder, single episode, unspecified: Secondary | ICD-10-CM | POA: Insufficient documentation

## 2013-10-04 DIAGNOSIS — M899 Disorder of bone, unspecified: Secondary | ICD-10-CM | POA: Insufficient documentation

## 2013-10-04 DIAGNOSIS — R0609 Other forms of dyspnea: Secondary | ICD-10-CM | POA: Insufficient documentation

## 2013-10-04 DIAGNOSIS — I428 Other cardiomyopathies: Secondary | ICD-10-CM | POA: Insufficient documentation

## 2013-10-04 DIAGNOSIS — Z9010 Acquired absence of unspecified breast and nipple: Secondary | ICD-10-CM | POA: Insufficient documentation

## 2013-10-04 DIAGNOSIS — Z853 Personal history of malignant neoplasm of breast: Secondary | ICD-10-CM | POA: Insufficient documentation

## 2013-10-04 DIAGNOSIS — R0989 Other specified symptoms and signs involving the circulatory and respiratory systems: Secondary | ICD-10-CM | POA: Insufficient documentation

## 2013-10-04 DIAGNOSIS — I509 Heart failure, unspecified: Secondary | ICD-10-CM | POA: Insufficient documentation

## 2013-10-04 DIAGNOSIS — I5022 Chronic systolic (congestive) heart failure: Secondary | ICD-10-CM

## 2013-10-04 MED ORDER — DIGOXIN 125 MCG PO TABS: 0.1250 mg | ORAL_TABLET | Freq: Every day | ORAL | Status: DC

## 2013-10-04 NOTE — Patient Instructions (Addendum)
Take Metolazone 2.5 mg (1/2 tab) today and tomorrow  Start Digoxin 0.125 mg daily  Cath is scheduled for [facility] 11/13, see instruction sheet

## 2013-10-04 NOTE — Progress Notes (Signed)
Patient ID: [name], female   DOB: 05/10/1951, 62 y.o.   MRN: [id number]  Patient Care Team: Jose E Paz, MD as PCP - General William S Tilley, MD as Consulting Physician (Cardiology)   HPI  [name] is a 62 y/o woman with h/o chronic systolic presumed due to chemotherapy-related cardiomyopathy who is referred by Dr. Tilley for consideration of advanced FH therapies.   She was diagnosed with breast cancer in [place] in [place]. Treated with chemo (including adriamycin)/XRT and L mastectomy. Had R breast cancer (unrelated) in 1987. Treated with chemo and mastectomy. Has been cancer free since.   Denies HTN, HL, DM2.  Was first diagnosed with HF in 1988. Had left heart cath she thinks in 2009. Which shoed normal coronaries with small LAD to PA fistula. Echo 2009 EF 20% with moderate AI and mild to moderate MR. Had ICD placed and then LV lead became nonfunctional. Was feeling worse earlier this year and in 3/14 underwent CRT revision.   Previously on coumadin due to cardiomyopathy and this was recently discontinued.   Had echo last week with Dr. Tilley and EF 10-15% with significant dyssynchrony. Subsequently had LV lead checked and was functioning well. 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 Get dyspneic with minimal walking down the hall. Has to stop frequently with housework.  Her edema has been fairly well controlled but later has had some intermittent LE swelling. Dr. Tilley increased her lasix from 40/40 -> 80/40.  Unable to tolerate spiro due to fatigue. Over past 3 weeks has had nightly orthopnea and PND. Has gained about 10 pounds over last 4 months.  Benicar had to be stopped several weeks ago due to hypotension. Becoming depressed and tearful over her functional decline.   Blood type A+   FHX: Had older brother with HF in his 70s. No strong FHx of HF.   SocHx: still working. Non-smoker. Rare ETOH. 3 grown kids.     Past Medical History  Diagnosis Date  .  Breast CA     twice first on L breast 1982 w/ chest wall involvment, then a second breast ca on the R in 1987  . Chronic systolic CHF (congestive heart failure)     a. due to Adriamycin,s/p ICD;  b. 01/2013 Gen change and new LV lead - BSX Energen CRT-D BiV ICD, Ser # [id number]  . Nonischemic cardiomyopathy   . Diverticulitis   . Osteopenia   . Renal calculus   . Menopause     since age 30s  . Depression     Past Surgical History  Procedure Laterality Date  . Mastectomy      B  . Oophorectomy      B, per genticists advise at [facility]  . Cardiac defibrillator placement      AICD Replaced-5/09    Current Outpatient Prescriptions  Medication Sig Dispense Refill  . ascorbic acid (VITAMIN C) 1000 MG tablet Take 1,000 mg by mouth daily.      . Biotin 5000 MCG CAPS Take 1 capsule by mouth daily.      . carvedilol (COREG) 6.25 MG tablet TAKE 1 TABLET BY MOUTH TWICE A DAY WITH MEALS  60 tablet  5  . famotidine (PEPCID) 20 MG tablet TAKE  ONE TABLET BY MOUTH NIGHTLY AT BEDTIME  30 tablet  11  . furosemide (LASIX) 40 MG tablet Take 40 mg by mouth 3 (three) times daily. Takes two in morning, one in afternoon      .   HYDROcodone-acetaminophen (NORCO/VICODIN) 5-325 MG per tablet Take 1 tablet by mouth every 6 (six) hours as needed for pain.  30 tablet  0  . Multiple Minerals-Vitamins (CALCIUM & VIT D3 BONE HEALTH PO) Take 600 capsules by mouth daily.      . multivitamin (THERAGRAN) per tablet Take 1 tablet by mouth daily.       . pantoprazole (PROTONIX) 40 MG tablet TAKE 1 TABLET (40 MG TOTAL) BY MOUTH DAILY. TAKE 30-60 MIN BEFORE FIRST MEAL OF THE DAY  30 tablet  11  . potassium chloride (KLOR-CON) 10 MEQ CR tablet Take 10 mEq by mouth 2 (two) times daily.       . Probiotic Product (PROBIOTIC DAILY PO) Take 1 tablet by mouth daily.      . sertraline (ZOLOFT) 25 MG tablet TAKE ONE TABLET BY MOUTH DAILY  90 tablet  1  . vitamin B-12 (CYANOCOBALAMIN) 1000 MCG tablet Take 1,000 mcg by mouth daily.       . zolpidem (AMBIEN) 10 MG tablet Take 1 tablet (10 mg total) by mouth at bedtime as needed. For sleep  90 tablet  1   No current facility-administered medications for this encounter.    Allergies  Allergen Reactions  . Atacand [Candesartan]     Fatigue   . Cephalexin     REACTION: itching, hives  . Spironolactone     Fatigue   . Latex Rash    Review of Systems negative except from HPI and PMH  Physical Exam BP 110/72  Pulse 92  Ht 5' 5" (1.651 m)  Wt 191 lb 1.9 oz (86.691 kg)  BMI 31.80 kg/m2  SpO2 99% Well developed and well nourished. +dyspnea on exertion HEENT normal Neck Supple JVP to jaw; carotids brisk and full Lungs: Clear to ausculation Cor: RRR +s3. Soft AI  Ad: obese Soft. NT ND. No hepatosplenomegaly good bowel sounds Extremities: warm No clubbing cyanosis 1-2+ Edema Alert and oriented, grossly normal motor and sensory function Skin no rash Affect; normal. Tearful at times   Assessment and  Plan  1) Chronic systolic HF, class IV 2) NICM, likely due to anthracycline toxicity     --EF 10-15%     --s/p CRT-D 3) h/o bilateral breast CA  Ms. Goracke has end-stage HF with progressive NYHA Class IV symptoms despite aggressive HF therapy by Dr. Tilley, She has a NICM with EF 10-15% likely due to anthracycline toxicity. We talked extensively about the options of advacned therapies and she is interested in proceeding with transplant or VAD. We will start with L and RHC on Thursday. She understands she may need to be admitted for inotropes and diuresis post-cath. Given her volume overload, we will have her take Metolazone 2.5 mg (1/2 tab) today and tomorrow. We will also start Digoxin 0.125 mg daily. I have given her my cell phone# if her husband has any questions prior to cath.   Marticia Reifschneider,MD 11:45 PM          

## 2013-10-06 ENCOUNTER — Ambulatory Visit (HOSPITAL_COMMUNITY)
Admission: RE | Admit: 2013-10-06 | Discharge: 2013-10-06 | Disposition: A | Discharge status: Home / Self Care | Payer: Managed Care, Other (non HMO) | Source: Ambulatory Visit | Attending: Internal Medicine | Admitting: Internal Medicine

## 2013-10-06 ENCOUNTER — Encounter (HOSPITAL_COMMUNITY)
Admission: RE | Disposition: A | Discharge status: Home / Self Care | Payer: Self-pay | Source: Ambulatory Visit | Attending: Internal Medicine

## 2013-10-06 ENCOUNTER — Other Ambulatory Visit: Payer: Self-pay

## 2013-10-06 ENCOUNTER — Encounter (HOSPITAL_COMMUNITY): Payer: Self-pay | Admitting: *Deleted

## 2013-10-06 DIAGNOSIS — I509 Heart failure, unspecified: Secondary | ICD-10-CM

## 2013-10-06 DIAGNOSIS — Z9010 Acquired absence of unspecified breast and nipple: Secondary | ICD-10-CM | POA: Insufficient documentation

## 2013-10-06 DIAGNOSIS — I5022 Chronic systolic (congestive) heart failure: Secondary | ICD-10-CM | POA: Insufficient documentation

## 2013-10-06 DIAGNOSIS — Z853 Personal history of malignant neoplasm of breast: Secondary | ICD-10-CM | POA: Insufficient documentation

## 2013-10-06 DIAGNOSIS — I428 Other cardiomyopathies: Secondary | ICD-10-CM | POA: Insufficient documentation

## 2013-10-06 HISTORY — PX: LEFT AND RIGHT HEART CATHETERIZATION WITH CORONARY ANGIOGRAM: SHX5449

## 2013-10-06 LAB — BASIC METABOLIC PANEL
BUN: 15 mg/dL (ref 6–23)
BUN: 14 mg/dL (ref 6–23)
CO2: 31 mEq/L (ref 19–32)
Calcium: 8.9 mg/dL (ref 8.4–10.5)
Calcium: 8.7 mg/dL (ref 8.4–10.5)
Chloride: 95 mEq/L — ABNORMAL LOW (ref 96–112)
Chloride: 96 mEq/L (ref 96–112)
GFR calc Af Amer: 79 mL/min — ABNORMAL LOW (ref >90)
GFR calc non Af Amer: 68 mL/min — ABNORMAL LOW (ref >90)
Glucose, Bld: 106 mg/dL — ABNORMAL HIGH (ref 70–99)
Glucose, Bld: 84 mg/dL (ref 70–99)
Potassium: 2.6 mEq/L — CL (ref 3.5–5.1)
Potassium: 2.8 mEq/L — ABNORMAL LOW (ref 3.5–5.1)
Sodium: 138 mEq/L (ref 135–145)
Sodium: 139 mEq/L (ref 135–145)

## 2013-10-06 LAB — POCT I-STAT 3, ART BLOOD GAS (G3+)
Acid-Base Excess: 7 mmol/L — ABNORMAL HIGH (ref 0.0–2.0)
Bicarbonate: 29.1 mEq/L — ABNORMAL HIGH (ref 20.0–24.0)
O2 Saturation: 98 %
TCO2: 30 mmol/L (ref 0–100)
pO2, Arterial: 84 mmHg (ref 80.0–100.0)

## 2013-10-06 LAB — POCT I-STAT 3, VENOUS BLOOD GAS (G3P V)
Acid-Base Excess: 5 mmol/L — ABNORMAL HIGH (ref 0.0–2.0)
Acid-Base Excess: 5 mmol/L — ABNORMAL HIGH (ref 0.0–2.0)
Acid-Base Excess: 1 mmol/L (ref 0.0–2.0)
Bicarbonate: 29.2 mEq/L — ABNORMAL HIGH (ref 20.0–24.0)
O2 Saturation: 64 %
O2 Saturation: 59 %
TCO2: 30 mmol/L (ref 0–100)
TCO2: 30 mmol/L (ref 0–100)
pCO2, Ven: 39.1 mmHg — ABNORMAL LOW (ref 45.0–50.0)
pCO2, Ven: 38.8 mmHg — ABNORMAL LOW (ref 45.0–50.0)
pH, Ven: 7.477 — ABNORMAL HIGH (ref 7.250–7.300)
pO2, Ven: 30 mmHg (ref 30.0–45.0)
pO2, Ven: 31 mmHg (ref 30.0–45.0)
pO2, Ven: 30 mmHg (ref 30.0–45.0)

## 2013-10-06 LAB — CBC
HCT: 37.6 % (ref 36.0–46.0)
Hemoglobin: 12.7 g/dL (ref 12.0–15.0)
MCHC: 33.8 g/dL (ref 30.0–36.0)
Platelets: 279 10*3/uL (ref 150–400)
RBC: 4.2 MIL/uL (ref 3.87–5.11)
RDW: 14 % (ref 11.5–15.5)
WBC: 5.9 10*3/uL (ref 4.0–10.5)

## 2013-10-06 LAB — PROTIME-INR
INR: 1.33 (ref 0.00–1.49)
Prothrombin Time: 16.2 seconds — ABNORMAL HIGH (ref 11.6–15.2)

## 2013-10-06 SURGERY — LEFT AND RIGHT HEART CATHETERIZATION WITH CORONARY ANGIOGRAM
Anesthesia: LOCAL

## 2013-10-06 MED ORDER — POTASSIUM CHLORIDE CRYS ER 20 MEQ PO TBCR
40.0000 meq | EXTENDED_RELEASE_TABLET | Freq: Once | ORAL | Status: AC
  Administered 2013-10-06: 40 meq via ORAL

## 2013-10-06 MED ORDER — ACETAMINOPHEN 325 MG PO TABS: 650.0000 mg | ORAL_TABLET | ORAL | Status: DC | PRN

## 2013-10-06 MED ORDER — SODIUM CHLORIDE 0.9 % IV SOLN: INTRAVENOUS | Status: AC

## 2013-10-06 MED ORDER — HEPARIN (PORCINE) IN NACL 2-0.9 UNIT/ML-% IJ SOLN
INTRAMUSCULAR | Status: AC
  Filled 2013-10-06: qty 1500

## 2013-10-06 MED ORDER — NITROGLYCERIN 0.2 MG/ML ON CALL CATH LAB
INTRAVENOUS | Status: AC
  Filled 2013-10-06: qty 2

## 2013-10-06 MED ORDER — SODIUM CHLORIDE 0.9 % IV SOLN: INTRAVENOUS | Status: DC

## 2013-10-06 MED ORDER — SODIUM CHLORIDE 0.9 % IV SOLN
250.0000 mL | INTRAVENOUS | Status: DC | PRN
  Administered 2013-10-06: 250 mL via INTRAVENOUS

## 2013-10-06 MED ORDER — POTASSIUM CHLORIDE CRYS ER 20 MEQ PO TBCR
40.0000 meq | EXTENDED_RELEASE_TABLET | Freq: Once | ORAL | Status: AC
  Administered 2013-10-06: 40 meq via ORAL
  Filled 2013-10-06 (×3): qty 2

## 2013-10-06 MED ORDER — ONDANSETRON HCL 4 MG/2ML IJ SOLN: 4.0000 mg | Freq: Four times a day (QID) | INTRAMUSCULAR | Status: DC | PRN

## 2013-10-06 MED ORDER — POTASSIUM CHLORIDE CRYS ER 20 MEQ PO TBCR
40.0000 meq | EXTENDED_RELEASE_TABLET | Freq: Once | ORAL | Status: AC
  Administered 2013-10-06: 40 meq via ORAL
  Filled 2013-10-06 (×2): qty 2

## 2013-10-06 MED ORDER — SODIUM CHLORIDE 0.9 % IJ SOLN: 3.0000 mL | Freq: Two times a day (BID) | INTRAMUSCULAR | Status: DC

## 2013-10-06 MED ORDER — ASPIRIN 81 MG PO CHEW
324.0000 mg | CHEWABLE_TABLET | ORAL | Status: AC
  Administered 2013-10-06: 324 mg via ORAL
  Filled 2013-10-06: qty 4

## 2013-10-06 MED ORDER — MIDAZOLAM HCL 2 MG/2ML IJ SOLN
INTRAMUSCULAR | Status: AC
  Filled 2013-10-06: qty 2

## 2013-10-06 MED ORDER — LIDOCAINE HCL (PF) 1 % IJ SOLN
INTRAMUSCULAR | Status: AC
  Filled 2013-10-06: qty 60

## 2013-10-06 MED ORDER — SODIUM CHLORIDE 0.9 % IJ SOLN: 3.0000 mL | INTRAMUSCULAR | Status: DC | PRN

## 2013-10-06 NOTE — Progress Notes (Signed)
Pt received form cath procedure alert and able to tolerate fluids and food.  Family at bedside.

## 2013-10-06 NOTE — Interval H&P Note (Signed)
History and Physical Interval Note:  10/06/2013 4:10 PM  [name]  has presented today for surgery, with the diagnosis of CHF  The various methods of treatment have been discussed with the patient and family. After consideration of risks, benefits and other options for treatment, the patient has consented to  Procedure(s): LEFT AND RIGHT HEART CATHETERIZATION WITH CORONARY ANGIOGRAM (N/A) as a surgical intervention .  The patient's history has been reviewed, patient examined, no change in status, stable for surgery.  I have reviewed the patient's chart and labs.  Questions were answered to the patient's satisfaction.     Daniel Bensimhon

## 2013-10-06 NOTE — H&P (View-Only) (Signed)
Patient ID: [name], female   DOB: 14-Nov-1951, 62 y.o.   MRN: [id number]  Patient Care Team: Wanda Plump, MD as PCP - General Othella Boyer, MD as Consulting Physician (Cardiology)   HPI  [name] is a 62 y/o woman with h/o chronic systolic presumed due to chemotherapy-related cardiomyopathy who is referred by Dr. Donnie Kirby for consideration of advanced FH therapies.   She was diagnosed with breast cancer in [place] in [place]. Treated with chemo (including adriamycin)/XRT and L mastectomy. Had R breast cancer (unrelated) in 1987. Treated with chemo and mastectomy. Has been cancer free since.   Denies HTN, HL, DM2.  Was first diagnosed with HF in 1988. Had left heart cath she thinks in 2009. Which shoed normal coronaries with small LAD to PA fistula. Echo 2009 EF 20% with moderate AI and mild to moderate MR. Had ICD placed and then LV lead became nonfunctional. Was feeling worse earlier this year and in 3/14 underwent CRT revision.   Previously on coumadin due to cardiomyopathy and this was recently discontinued.   Had echo last week with Dr. Donnie Kirby and EF 10-15% with significant dyssynchrony. Subsequently had LV lead checked and was functioning well. 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 Get dyspneic with minimal walking down the hall. Has to stop frequently with housework.  Her edema has been fairly well controlled but later has had some intermittent LE swelling. Dr. Donnie Kirby increased her lasix from 40/40 -> 80/40.  Unable to tolerate spiro due to fatigue. Over past 3 weeks has had nightly orthopnea and PND. Has gained about 10 pounds over last 4 months.  Benicar had to be stopped several weeks ago due to hypotension. Becoming depressed and tearful over her functional decline.   Blood type A+   FHX: Had older brother with HF in his 20s. No strong FHx of HF.   SocHx: still working. Non-smoker. Rare ETOH. 3 grown kids.     Past Medical History  Diagnosis Date  .  Breast CA     twice first on L breast 1982 w/ chest wall involvment, then a second breast ca on the R in 1987  . Chronic systolic CHF (congestive heart failure)     a. due to Adriamycin,s/p ICD;  b. 01/2013 Gen change and new LV lead - BSX Energen CRT-D BiV ICD, Ser # [id number]  . Nonischemic cardiomyopathy   . Diverticulitis   . Osteopenia   . Renal calculus   . Menopause     since age 63s  . Depression     Past Surgical History  Procedure Laterality Date  . Mastectomy      B  . Oophorectomy      B, per genticists advise at [facility]  . Cardiac defibrillator placement      AICD Replaced-5/09    Current Outpatient Prescriptions  Medication Sig Dispense Refill  . ascorbic acid (VITAMIN C) 1000 MG tablet Take 1,000 mg by mouth daily.      . Biotin 5000 MCG CAPS Take 1 capsule by mouth daily.      . carvedilol (COREG) 6.25 MG tablet TAKE 1 TABLET BY MOUTH TWICE A DAY WITH MEALS  60 tablet  5  . famotidine (PEPCID) 20 MG tablet TAKE  ONE TABLET BY MOUTH NIGHTLY AT BEDTIME  30 tablet  11  . furosemide (LASIX) 40 MG tablet Take 40 mg by mouth 3 (three) times daily. Takes two in morning, one in afternoon      .  HYDROcodone-acetaminophen (NORCO/VICODIN) 5-325 MG per tablet Take 1 tablet by mouth every 6 (six) hours as needed for pain.  30 tablet  0  . Multiple Minerals-Vitamins (CALCIUM & VIT D3 BONE HEALTH PO) Take 600 capsules by mouth daily.      . multivitamin (THERAGRAN) per tablet Take 1 tablet by mouth daily.       . pantoprazole (PROTONIX) 40 MG tablet TAKE 1 TABLET (40 MG TOTAL) BY MOUTH DAILY. TAKE 30-60 MIN BEFORE FIRST MEAL OF THE DAY  30 tablet  11  . potassium chloride (KLOR-CON) 10 MEQ CR tablet Take 10 mEq by mouth 2 (two) times daily.       . Probiotic Product (PROBIOTIC DAILY PO) Take 1 tablet by mouth daily.      . sertraline (ZOLOFT) 25 MG tablet TAKE ONE TABLET BY MOUTH DAILY  90 tablet  1  . vitamin B-12 (CYANOCOBALAMIN) 1000 MCG tablet Take 1,000 mcg by mouth daily.       Marland Kitchen zolpidem (AMBIEN) 10 MG tablet Take 1 tablet (10 mg total) by mouth at bedtime as needed. For sleep  90 tablet  1   No current facility-administered medications for this encounter.    Allergies  Allergen Reactions  . Atacand [Candesartan]     Fatigue   . Cephalexin     REACTION: itching, hives  . Spironolactone     Fatigue   . Latex Rash    Review of Systems negative except from HPI and PMH  Physical Exam BP 110/72  Pulse 92  Ht 5\' 5"  (1.651 m)  Wt 191 lb 1.9 oz (86.691 kg)  BMI 31.80 kg/m2  SpO2 99% Well developed and well nourished. +dyspnea on exertion HEENT normal Neck Supple JVP to jaw; carotids brisk and full Lungs: Clear to ausculation Cor: RRR +s3. Soft AI  Ad: obese Soft. NT ND. No hepatosplenomegaly good bowel sounds Extremities: warm No clubbing cyanosis 1-2+ Edema Alert and oriented, grossly normal motor and sensory function Skin no rash Affect; normal. Tearful at times   Assessment and  Plan  1) Chronic systolic HF, class IV 2) NICM, likely due to anthracycline toxicity     --EF 10-15%     --s/p CRT-D 3) h/o bilateral breast CA  [name] has end-stage HF with progressive NYHA Class IV symptoms despite aggressive HF therapy by Dr. Donnie Kirby, She has a NICM with EF 10-15% likely due to anthracycline toxicity. We talked extensively about the options of advacned therapies and she is interested in proceeding with transplant or VAD. We will start with L and RHC on Thursday. She understands she may need to be admitted for inotropes and diuresis post-cath. Given her volume overload, we will have her take Metolazone 2.5 mg (1/2 tab) today and tomorrow. We will also start Digoxin 0.125 mg daily. I have given her my cell phone# if her husband has any questions prior to cath.   Daniel Bensimhon,MD 11:45 PM

## 2013-10-06 NOTE — Progress Notes (Signed)
Discharge instruction given per MD order.  Pt and CG able to verbalize understanding.  Pt denies any discomfort at this time.  Pt traveled to car via wheelchair.

## 2013-10-06 NOTE — CV Procedure (Signed)
Cardiac Cath Procedure Note  Indication: HF  Procedures performed:  1) Right heart cathererization 2) Selective coronary angiography 3) Left heart catheterization 4) Left ventriculogram  Description of procedure:     The risks and indication of the procedure were explained. Consent was signed and placed on the chart. An appropriate timeout was taken prior to the procedure. The right groin was prepped and draped in the routine sterile fashion and anesthetized with 1% local lidocaine.   A 5 FR arterial sheath was placed in the right femoral artery using a modified Seldinger technique. Standard catheters including a JL4, JR4 and angled pigtail were used. All catheter exchanges were made over a wire. A 7 FR venous sheath was placed in the right femoral vein using a modified Seldinger technique. A standard Swan-Ganz catheter was used for the procedure.   Complications:  None apparent  Findings:  RA = 11 RV = 54/9/13 PA = 53/27 (38) PCW = 23 Fick cardiac output/index = 3.8/2.0 Thermo CO/CI = 3.4/1.8 PVR = 3.9 WU (Fick) FA sat = 98% PA sat = 62%, 64% SVC = 59%  Ao Pressure:  104/59 (77) LV Pressure:   111/17/26 There was no signficant gradient across the aortic valve on pullback.  Left main: Normal  LAD: Large vessel gives off 2 diagonals. 1st diagonal is small and has fistulous connection to PA. 2nd diagonal is large. Normal  LCX: Large dominant vessel. Normal.   RCA: Moderate sized codominant vessel with large RV branch. Normal  LV-gram done in the RAO projection: Markedly dilated with severe global HK. Ejection fraction = 10-15%  Assessment: 1. Severe nonischemic cardiomyopathy with EF 10-15% 2. Mild to moderately elevated biventricular filling pressures with low cardiac output and PVR =4 3. Coronary to PA fistula  Plan/Discussion:  She will need work-up for advanced therapies. Will schedule CPX testing for next week. I have instructed he to increase lasix to 80 po  bid. Consider addition of sildenafil.   Arvilla Meres, MD 4:49 PM

## 2013-10-17 ENCOUNTER — Telehealth (HOSPITAL_COMMUNITY): Payer: Self-pay | Admitting: Cardiology

## 2013-10-17 NOTE — Telephone Encounter (Signed)
Will send to Dr Bensimhon for review 

## 2013-10-17 NOTE — Telephone Encounter (Signed)
Pt was told be Dr.Bensimhon that she could work PT Pt is currently working FT for the next few weeks and pt wants a letter to give to job informing them she can work PT and restrictions if any

## 2013-10-18 ENCOUNTER — Encounter (HOSPITAL_COMMUNITY): Payer: Managed Care, Other (non HMO)

## 2013-10-21 ENCOUNTER — Ambulatory Visit (INDEPENDENT_AMBULATORY_CARE_PROVIDER_SITE_OTHER): Payer: Managed Care, Other (non HMO) | Admitting: Internal Medicine

## 2013-10-21 ENCOUNTER — Encounter: Payer: Self-pay | Admitting: Internal Medicine

## 2013-10-21 VITALS — BP 106/70 | HR 87 | Temp 98.7°F | Wt 177.0 lb

## 2013-10-21 DIAGNOSIS — R05 Cough: Secondary | ICD-10-CM

## 2013-10-21 MED ORDER — HYDROCODONE-HOMATROPINE 5-1.5 MG/5ML PO SYRP: 5.0000 mL | ORAL_SOLUTION | Freq: Three times a day (TID) | ORAL | Status: DC | PRN

## 2013-10-21 MED ORDER — DOXYCYCLINE HYCLATE 100 MG PO TABS: 100.0000 mg | ORAL_TABLET | Freq: Two times a day (BID) | ORAL | Status: DC

## 2013-10-21 NOTE — Patient Instructions (Addendum)
Rest, fluids , tylenol For cough, take Mucinex DM twice a day as needed  If cough severe take hydrocodone (drowsiness)  Take the antibiotic as prescribed  (doxy) Call if no better in few days Call anytime if the symptoms are severe    Next visit in 3 month for a   follow up . No Fasting Please make an appointment

## 2013-10-21 NOTE — Assessment & Plan Note (Addendum)
Chronic cough with acute exacerbation, likely from intercurrent bronchitis. Of note, the chest x-ray 09/19/2013 show a lesion at the 6 rib felt to be a healed fracture but with the patient's history of breast cancer the radiologist is somehow concerned. Plan:  Treat bronchitis, see instructions Re-do x-ray in few months, she has been clinically cured from breast cancer for many years.

## 2013-10-21 NOTE — Progress Notes (Signed)
Pre visit review using our clinic review tool, if applicable. No additional management support is needed unless otherwise documented below in the visit note. 

## 2013-10-21 NOTE — Progress Notes (Signed)
   Subjective:    Patient ID: [name], female    DOB: August 04, 1951, 62 y.o.   MRN: [id number]  HPI Acute visit He has a history of chronic cough, cough resurface 3 months ago but it was mild. Last week the cough increased, it was intense at time, was associated with some chest congestion. About 3 days ago she developed pain at the right side of her rib cage with cough.  Past Medical History  Diagnosis Date  . Breast CA     twice first on L breast 1982 w/ chest wall involvment, then a second breast ca on the R in 1987  . Chronic systolic CHF (congestive heart failure)     a. due to Adriamycin,s/p ICD;  b. 01/2013 Gen change and new LV lead - BSX Energen CRT-D BiV ICD, Ser # [id number]  . Nonischemic cardiomyopathy   . Diverticulitis   . Osteopenia   . Renal calculus   . Menopause     since age 65s  . Depression    Past Surgical History  Procedure Laterality Date  . Mastectomy      B  . Oophorectomy      B, per genticists advise at [facility]  . Cardiac defibrillator placement      AICD Replaced-5/09   History   Social History  . Marital Status: Married    Spouse Name: N/A    Number of Children: 3  . Years of Education: N/A   Occupational History  . pre school director      preschool   Social History Main Topics  . Smoking status: Never Smoker   . Smokeless tobacco: Never Used  . Alcohol Use: Yes     Comment: socially/occassionally  . Drug Use: No  . Sexual Activity: Not on file   Other Topics Concern  . Not on file   Social History Narrative   Related to Mr and [name], they are my patients as well    Married, 3 children all in [place], 7 Gkids           Review of Systems Denies fever or chills Some sputum production, clear in color. No hemoptysis. No GERD symptoms Mild sneezing, no postnasal dripping or sinus pain.    Objective:   Physical Exam BP 106/70  Pulse 87  Temp(Src) 98.7 F (37.1 C)  Wt 177 lb (80.287 kg)  SpO2 97% General --  alert, well-developed, NAD.  HEENT-- Not pale. TMs normal, throat symmetric, no redness or discharge. Face symmetric, sinuses not tender to palpation. Nose not congested. Lungs -- normal respiratory effort, no intercostal retractions, no accessory muscle use, and few ronchi w/ cough Heart-- normal rate, regular rhythm, + syst  murmur.  Extremities-- no pretibial edema bilaterally  Neurologic--  alert & oriented X3. Speech normal, gait normal, strength normal in all extremities.  Psych-- Cognition and judgment appear intact. Cooperative with normal attention span and concentration. No anxious appearing , no depressed appearing.      Assessment & Plan:

## 2013-10-24 ENCOUNTER — Telehealth: Payer: Self-pay | Admitting: Internal Medicine

## 2013-10-24 DIAGNOSIS — R9389 Abnormal findings on diagnostic imaging of other specified body structures: Secondary | ICD-10-CM

## 2013-10-24 NOTE — Telephone Encounter (Signed)
Please advise 

## 2013-10-24 NOTE — Telephone Encounter (Signed)
[place], I just enter a order for a CT chest, let the patient know.

## 2013-10-24 NOTE — Telephone Encounter (Signed)
Patient is concerned about her latest imaging result because she says that Dr. Drue Novel saw a small spot on her rib cage. She says she spoke with her cardiologist and he recommended that she ask Dr. Drue Novel for a CT scan to ease her mind. Please advise.

## 2013-10-25 ENCOUNTER — Ambulatory Visit (HOSPITAL_COMMUNITY): Payer: Managed Care, Other (non HMO) | Attending: Internal Medicine

## 2013-10-25 DIAGNOSIS — I5022 Chronic systolic (congestive) heart failure: Secondary | ICD-10-CM

## 2013-10-25 DIAGNOSIS — I428 Other cardiomyopathies: Secondary | ICD-10-CM

## 2013-10-25 NOTE — Telephone Encounter (Signed)
Pt scheduled for CT Chest on 10/31/13.

## 2013-10-27 ENCOUNTER — Other Ambulatory Visit: Payer: Self-pay | Admitting: Internal Medicine

## 2013-10-27 NOTE — Telephone Encounter (Signed)
rx refilled per protocol. DJR  

## 2013-10-31 ENCOUNTER — Ambulatory Visit (HOSPITAL_COMMUNITY)
Admission: RE | Admit: 2013-10-31 | Discharge: 2013-10-31 | Disposition: A | Discharge status: Home / Self Care | Payer: Managed Care, Other (non HMO) | Source: Ambulatory Visit | Attending: Internal Medicine | Admitting: Internal Medicine

## 2013-10-31 ENCOUNTER — Ambulatory Visit (INDEPENDENT_AMBULATORY_CARE_PROVIDER_SITE_OTHER)
Admission: RE | Admit: 2013-10-31 | Discharge: 2013-10-31 | Disposition: A | Discharge status: Home / Self Care | Payer: Managed Care, Other (non HMO) | Source: Ambulatory Visit | Attending: Internal Medicine | Admitting: Internal Medicine

## 2013-10-31 VITALS — BP 102/60 | HR 88 | Wt 176.8 lb

## 2013-10-31 DIAGNOSIS — I5022 Chronic systolic (congestive) heart failure: Secondary | ICD-10-CM | POA: Insufficient documentation

## 2013-10-31 DIAGNOSIS — R918 Other nonspecific abnormal finding of lung field: Secondary | ICD-10-CM

## 2013-10-31 DIAGNOSIS — R9389 Abnormal findings on diagnostic imaging of other specified body structures: Secondary | ICD-10-CM

## 2013-10-31 LAB — BASIC METABOLIC PANEL
CO2: 30 mEq/L (ref 19–32)
Calcium: 9 mg/dL (ref 8.4–10.5)
Chloride: 99 mEq/L (ref 96–112)
GFR calc Af Amer: >90 mL/min (ref >90)
Sodium: 142 mEq/L (ref 135–145)

## 2013-10-31 LAB — DIGOXIN LEVEL: Digoxin Level: 0.9 ng/mL (ref 0.8–2.0)

## 2013-10-31 LAB — PRO B NATRIURETIC PEPTIDE: Pro B Natriuretic peptide (BNP): 3906 pg/mL — ABNORMAL HIGH (ref 0–125)

## 2013-10-31 MED ORDER — POTASSIUM CHLORIDE ER 10 MEQ PO TBCR: 30.0000 meq | EXTENDED_RELEASE_TABLET | Freq: Two times a day (BID) | ORAL | Status: DC

## 2013-10-31 MED ORDER — IOHEXOL 300 MG/ML  SOLN
80.0000 mL | Freq: Once | INTRAMUSCULAR | Status: AC | PRN
  Administered 2013-10-31: 80 mL via INTRAVENOUS

## 2013-10-31 MED ORDER — LOSARTAN POTASSIUM 25 MG PO TABS: 25.0000 mg | ORAL_TABLET | Freq: Every day | ORAL | Status: DC

## 2013-10-31 NOTE — Patient Instructions (Addendum)
Take losartan 25 mg daily at bed time  Increase Potassium to 30 meq (3 tabs) Twice daily   Repeat BMET in 10 days  Do the following things EVERYDAY: 1) Weigh yourself in the morning before breakfast. Write it down and keep it in a log. 2) Take your medicines as prescribed 3) Eat low salt foods-Limit salt (sodium) to 2000 mg per day.  4) Stay as active as you can everyday 5) Limit all fluids for the day to less than 2 liters  Follow up in 1 month

## 2013-10-31 NOTE — Progress Notes (Signed)
Patient ID: [name], female   DOB: 1951/07/26, 62 y.o.   MRN: [id number] Patient Care Team: Wanda Plump, MD as PCP - General Othella Boyer, MD as Consulting Physician (Cardiology)  Subjective  [name] is a 62 y/o woman with h/o chronic systolic presumed due to chemotherapy-related cardiomyopathy diagnosed with breast cancer in 1982 in [place]. Treated with chemo (including adriamycin)/XRT and L mastectomy. Had R breast cancer (unrelated) in 1987. Has been cancer free since. Denies HTN, HL, DM2.  Was first diagnosed with HF in 1988. Had left heart cath [name] thinks in 2009. 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 Echo 2009 EF 20% with moderate AI and mild to moderate MR. Had ICD placed and then LV lead became nonfunctional. Was feeling worse earlier this year and in 3/14 underwent CRT revision.   Previously on coumadin due to cardiomyopathy and this was recently discontinued.   ECHO 09/2013 with Dr. Donnie Aho and EF 10-15% with significant dyssynchrony. Subsequently had LV lead checked and was functioning well.   [name] returns for follow up with her husband, daughter, and granddaughter.  Last visit digoxin was added. Feels like [name] has a little more energy. SOB with exertion only if [name] doesn't pace herself. Mild dyspnea going up steps. Sleeps with HOB elevated. + PND.  Day time fatigue. [name] has not had sleep study. Does not feel rested. Weight stable at home 173-176 pounds. Taking all medications. 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 In the past [name] was unable to tolerate spiro due to fatigue.   RHC 10/06/13  RA = 11  RV = 54/9/13  PA = 53/27 (38)  PCW = 23  Fick cardiac output/index = 3.8/2.0  Thermo CO/CI = 3.4/1.8  PVR = 3.9 WU (Fick)  FA sat = 98%  PA sat = 62%, 64%  SVC = 59%   CPX 10/25/13  Peak VO2: 15.6 (81.2% predicted peak VO2) VE/VCO2 slope: 36.9 OUES: 1.25 Peak RER: 1.08  Lab 10/06/13 K 2.8 Creatinine  0.83 Labs 10/31/13 K 3.0 Creatinine 0.68  Dig level 0.9 Pro BNP 3906   Blood type A+   FHX: Had older brother with HF in his 27s. No strong FHx of HF.   SocHx: still working. Non-smoker. Rare ETOH. 3 grown kids.     Past Medical History  Diagnosis Date  . Breast CA     twice first on L breast 1982 w/ chest wall involvment, then a second breast ca on the R in 1987  . Chronic systolic CHF (congestive heart failure)     a. due to Adriamycin,s/p ICD;  b. 01/2013 Gen change and new LV lead - BSX Energen CRT-D BiV ICD, Ser # [id number]  . Nonischemic cardiomyopathy   . Diverticulitis   . Osteopenia   . Renal calculus   . Menopause     since age 62s  . Depression     Past Surgical History  Procedure Laterality Date  . Mastectomy      B  . Oophorectomy      B, per genticists advise at [facility]  . Cardiac defibrillator placement      AICD Replaced-5/09    Current Outpatient Prescriptions  Medication Sig Dispense Refill  . ascorbic acid (VITAMIN C) 1000 MG tablet Take 1,000 mg by mouth daily.      . Biotin 5000 MCG CAPS Take 1 capsule by mouth daily.      . carvedilol (  COREG) 6.25 MG tablet TAKE 1 TABLET BY MOUTH TWICE A DAY WITH MEALS  60 tablet  5  . digoxin (LANOXIN) 0.125 MG tablet Take 1 tablet (0.125 mg total) by mouth daily.  30 tablet  3  . furosemide (LASIX) 40 MG tablet Takes two in morning, one in afternoon      . HYDROcodone-homatropine (HYCODAN) 5-1.5 MG/5ML syrup Take 5 mLs by mouth 3 (three) times daily as needed for cough.  120 mL  0  . multivitamin (THERAGRAN) per tablet Take 1 tablet by mouth daily.       . pantoprazole (PROTONIX) 40 MG tablet TAKE 1 TABLET (40 MG TOTAL) BY MOUTH DAILY. TAKE 30-60 MIN BEFORE FIRST MEAL OF THE DAY  30 tablet  11  . potassium chloride (KLOR-CON) 10 MEQ CR tablet Take 10 mEq by mouth 2 (two) times daily.       . Probiotic Product (PROBIOTIC DAILY PO) Take 1 tablet by mouth daily.      . sertraline (ZOLOFT) 25 MG tablet TAKE 1 TABLET  BY MOUTH DAILY  60 tablet  2  . vitamin B-12 (CYANOCOBALAMIN) 1000 MCG tablet Take 1,000 mcg by mouth daily.      Marland Kitchen zolpidem (AMBIEN) 10 MG tablet Take 1 tablet (10 mg total) by mouth at bedtime as needed. For sleep  90 tablet  1   No current facility-administered medications for this encounter.    Allergies  Allergen Reactions  . Atacand [Candesartan]     Fatigue   . Cephalexin     REACTION: itching, hives  . Spironolactone     Fatigue   . Latex Rash    Review of Systems negative except from HPI and PMH  Physical Exam BP 102/60  Pulse 88  Wt 176 lb 12 oz (80.173 kg)  SpO2 97% General: Well developed and well nourished. NAD  HEENT:  normal Neck:Supple JVP 5-6 carotids  Lungs: Clear to ausculation Cor: RRR . Soft AI  Ad: obese Soft. NT ND. No hepatosplenomegaly good bowel sounds Extremities: warm No clubbing cyanosis, edema Alert and oriented, grossly normal motor and sensory function Skin:  no rash Neuro: A and O x 3 affect; normal.   Assessment and  Plan  1) Chronic systolic HF, NYHA IIIb.Has CRT-D ECHO 09/2013 EF 10-15% Volume status improved from last visit. Continue lasix 40 mg in am and 20 mg in pm.   Continue carvedilol 6.25 mg twice a day and digoxin 0.125 mg daily.  Add losartan 25 mg daily at bed time. Will not use ace due to cough. Repeat BMET in 10 days.  [name] is intolerant spironolactone due to fatigue.  RHC - 10/06/13 Fick cardiac output/index = 3.8/2.0 Thermo CO/CI = 3.4/1.8 Discussed CPX results. - CPX 10/25/13 Peak VO2: 15.6 (81.2% predicted peak VO2) VE/VCO2 slope: 36.9 OUES: 1.25Peak RER: 1.08 Dr Lisabeth Pick discussed advanced therapies - LVAD  versus transplant.  Reinforced daily weights, low salt food choices, and limiting fluid intake < 2 liters per day. Discussed sliding scale diuretics.  Check BMET, dig level, pro bnp. --->Labs 10/31/13 K 3.0 Creatinine 0.68  Dig level 0.9 Pro BNP 3906 . K increased to 30 meq twice a day.  Refer to pulmonary for  sleep study. Will need to refer to [facility] for possible transplant.   2) NICM, likely due to anthracycline toxicity     --EF 10-15%     --s/p CRT-D  3) h/o bilateral breast CA  Follow up in 4 weeks  CLEGG,AMY NP-C  11:32 AM  Patient seen and examined with Tonye Becket, NP. We discussed all aspects of the encounter. I agree with the assessment and plan as stated above.  Extensive discussion with her and her family reviewing recent RHC and CPX test. Currently NYHA III. Volume status looks good. Studies reveal [name] is getting close to the point where we will need to consider advanced therapies. I talked to her in detail about transplantation, VAD and inotropic support. [name] is interested in initiating transplant work-up process. We will have her go to [facility] to get in the system and meet the transplant team. We will add low-dose losartan as BP tolerates. May consider rechallenging again with spiro at some point. We will continue to follow closely. [name] knows to call us with any change in clinical status.   Time spent 55 minutes with over half that time spent discussing items above.   Raechell Singleton,MD 11:40 PM

## 2013-11-10 ENCOUNTER — Ambulatory Visit (INDEPENDENT_AMBULATORY_CARE_PROVIDER_SITE_OTHER): Payer: Managed Care, Other (non HMO)

## 2013-11-10 DIAGNOSIS — Z23 Encounter for immunization: Secondary | ICD-10-CM

## 2013-11-11 ENCOUNTER — Other Ambulatory Visit (HOSPITAL_COMMUNITY): Payer: Self-pay

## 2013-11-11 ENCOUNTER — Other Ambulatory Visit (INDEPENDENT_AMBULATORY_CARE_PROVIDER_SITE_OTHER): Payer: Managed Care, Other (non HMO)

## 2013-11-11 DIAGNOSIS — I509 Heart failure, unspecified: Secondary | ICD-10-CM

## 2013-11-11 LAB — BASIC METABOLIC PANEL
GFR: 82.58 mL/min (ref >60.00)
Glucose, Bld: 127 mg/dL — ABNORMAL HIGH (ref 70–99)
Potassium: 3.6 mEq/L (ref 3.5–5.1)
Sodium: 138 mEq/L (ref 135–145)

## 2013-11-30 ENCOUNTER — Ambulatory Visit (HOSPITAL_COMMUNITY)
Admission: RE | Admit: 2013-11-30 | Discharge: 2013-11-30 | Disposition: A | Discharge status: Home / Self Care | Payer: Managed Care, Other (non HMO) | Source: Ambulatory Visit | Attending: Internal Medicine | Admitting: Internal Medicine

## 2013-11-30 ENCOUNTER — Encounter (HOSPITAL_COMMUNITY): Payer: Self-pay

## 2013-11-30 VITALS — BP 104/60 | HR 70 | Resp 16 | Wt 177.2 lb

## 2013-11-30 DIAGNOSIS — I5022 Chronic systolic (congestive) heart failure: Secondary | ICD-10-CM

## 2013-11-30 MED ORDER — POTASSIUM CHLORIDE ER 10 MEQ PO TBCR
30.0000 meq | EXTENDED_RELEASE_TABLET | Freq: Every day | ORAL | Status: DC
Start: 2013-11-30 — End: 2014-04-10

## 2013-11-30 MED ORDER — CARVEDILOL 6.25 MG PO TABS: 9.3750 mg | ORAL_TABLET | Freq: Two times a day (BID) | ORAL | Status: DC

## 2013-11-30 MED ORDER — SPIRONOLACTONE 25 MG PO TABS: 12.5000 mg | ORAL_TABLET | Freq: Every day | ORAL | Status: DC

## 2013-11-30 NOTE — Patient Instructions (Addendum)
Take 12.5 mg spironolactone daily  Take 30 meq potassium once a day  Take 9.375 mg Carvedilol twice a day  Follow up in 2 months   Do the following things EVERYDAY: 1) Weigh yourself in the morning before breakfast. Write it down and keep it in a log. 2) Take your medicines as prescribed 3) Eat low salt foods-Limit salt (sodium) to 2000 mg per day.  4) Stay as active as you can everyday 5) Limit all fluids for the day to less than 2 liters

## 2013-11-30 NOTE — Progress Notes (Signed)
Patient ID: [name], female   DOB: 04/12/1951, 63 y.o.   MRN: [id number]   PCP: Dr Larose Kells Cardiologist: Dr Wynonia Lawman EP: Dr Caryl Comes   Subjective [name] is a 63 y/o woman with h/o chronic systolic presumed due to chemotherapy-related cardiomyopathy diagnosed with breast cancer in 1982 in [place]. Treated with chemo (including adriamycin)/XRT and L mastectomy. Had R breast cancer (unrelated) in 1987. Has been cancer free since. Denies HTN, HL, DM2.  Was first diagnosed with HF in 1988. Had left heart cath she thinks in 2009. 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 Echo 2009 EF 20% with moderate AI and mild to moderate MR. Had ICD placed and then LV lead became nonfunctional. Was feeling worse earlier this year and in 3/14 underwent CRT revision.   Previously on coumadin due to cardiomyopathy and this was recently discontinued.   ECHO 09/2013 with Dr. Wynonia Lawman and EF 10-15% with significant dyssynchrony. Subsequently had LV lead checked and was functioning well.   She returns for follow up with her husband and daughter. Last visit losartan 25 mg added at night and she has tolerated this without difficulty. Taking carvedilol 6.25/9.375. Denies SOB/PND. + Orthopnea with HOB elevated. Weight at home 173-178 pounds. She has not required additional lasix. 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   In the past she was unable to tolerate spiro due to fatigue.  RHC 10/06/13  RA = 11  RV = 54/9/13  PA = 53/27 (38)  PCW = 23  Fick cardiac output/index = 3.8/2.0  Thermo CO/CI = 3.4/1.8  PVR = 3.9 WU (Fick)  FA sat = 98%  PA sat = 62%, 64%  SVC = 59%   CPX 10/25/13  Peak VO2: 15.6 (81.2% predicted peak VO2) VE/VCO2 slope: 36.9 OUES: 1.25 Peak RER: 1.08  Lab 10/06/13 K 2.8 Creatinine 0.83 Labs 10/31/13 K 3.0 Creatinine 0.68  Dig level 0.9 Pro BNP 3906  Labs 11/11/13  K 3.6 Creatinine 0.8  Blood type A+    FHX: Had older brother with HF in his 23s. No strong FHx of HF.  SocHx: still working. Non-smoker. Rare ETOH. 3 grown kids.     Past Medical History  Diagnosis Date  . Breast CA     twice first on L breast 1982 w/ chest wall involvment, then a second breast ca on the R in 1987  . Chronic systolic CHF (congestive heart failure)     a. due to Adriamycin,s/p ICD;  b. 01/2013 Gen change and new LV lead - BSX Energen CRT-D BiV ICD, Ser # [id number]  . Nonischemic cardiomyopathy   . Diverticulitis   . Osteopenia   . Renal calculus   . Menopause     since age 59s  . Depression     Past Surgical History  Procedure Laterality Date  . Mastectomy      B  . Oophorectomy      B, per genticists advise at [place]  . Cardiac defibrillator placement      AICD Replaced-5/09    Current Outpatient Prescriptions  Medication Sig Dispense Refill  . ascorbic acid (VITAMIN C) 1000 MG tablet Take 1,000 mg by mouth daily.      . Biotin 5000 MCG CAPS Take 1 capsule by mouth daily.      . carvedilol (COREG) 6.25 MG tablet TAKE 1 TABLET BY MOUTH TWICE A DAY WITH MEALS  60 tablet  5  .  digoxin (LANOXIN) 0.125 MG tablet Take 1 tablet (0.125 mg total) by mouth daily.  30 tablet  3  . furosemide (LASIX) 40 MG tablet Takes two in morning, one in afternoon      . HYDROcodone-homatropine (HYCODAN) 5-1.5 MG/5ML syrup Take 5 mLs by mouth 3 (three) times daily as needed for cough.  120 mL  0  . losartan (COZAAR) 25 MG tablet Take 1 tablet (25 mg total) by mouth at bedtime.  30 tablet  6  . multivitamin (THERAGRAN) per tablet Take 1 tablet by mouth daily.       . pantoprazole (PROTONIX) 40 MG tablet TAKE 1 TABLET (40 MG TOTAL) BY MOUTH DAILY. TAKE 30-60 MIN BEFORE FIRST MEAL OF THE DAY  30 tablet  11  . potassium chloride (K-DUR) 10 MEQ tablet Take 3 tablets (30 mEq total) by mouth 2 (two) times daily.  180 tablet  3  . Probiotic Product (PROBIOTIC DAILY PO) Take 1 tablet by mouth daily.      . sertraline  (ZOLOFT) 25 MG tablet TAKE 1 TABLET BY MOUTH DAILY  60 tablet  2  . vitamin B-12 (CYANOCOBALAMIN) 1000 MCG tablet Take 1,000 mcg by mouth daily.      Marland Kitchen zolpidem (AMBIEN) 10 MG tablet Take 1 tablet (10 mg total) by mouth at bedtime as needed. For sleep  90 tablet  1   No current facility-administered medications for this encounter.    Allergies  Allergen Reactions  . Atacand [Candesartan]     Fatigue   . Cephalexin     REACTION: itching, hives  . Spironolactone     Fatigue   . Latex Rash    Review of Systems negative except from HPI and PMH  Physical Exam BP 104/60  Pulse 70  Resp 16  Wt 177 lb 4 oz (80.4 kg)  SpO2 100% General: Well developed and well nourished. NAD  HEENT:  normal Neck:Supple JVP 5-6 carotids  Lungs: Clear to ausculation Cor: RRR . Soft AI  Ad: obese Soft. NT ND. No hepatosplenomegaly good bowel sounds Extremities: warm No clubbing cyanosis, edema Alert and oriented, grossly normal motor and sensory function Skin:  no rash Neuro: A and O x 3 affect; normal.   Assessment and  Plan  1) Chronic systolic HF, NYHA IIIb.Has CRT-D ECHO 09/2013 EF 10-15% NYHA II-III. Volume status stable.  Continue lasix 40 mg in am and 20 mg in pm.   Increase carvedilol 9.375 mg twice a day. Continue digoxin 0.125 mg daily.  Continue losartan 25 mg daily at bed time. Will not use ace due to cough.  Add 12.5 mg spironolactone daily. Check BMET in 10 days.  Will refer to [facility] to meet transplant team.   2) NICM, likely due to anthracycline toxicity     --EF 10-15%     --s/p CRT-D  3) h/o bilateral breast CA  Follow up in 2 months   CLEGG,AMY NP-C  2:50 PM  Patient seen and examined with Darrick Grinder, NP. We discussed all aspects of the encounter. I agree with the assessment and plan as stated above.   Overall doing fairly well. NYHA II-III. Agree with titrating carvedilol as above and adding spiro. Will decrease Kcl to 30 daily (from 30 bid). Recheck BMET next week.  Will refer to [facility] for transplant eval.Knows to call us if symptoms getting worse. Encouraged her to cut back work schedule to part time.   Alejandra Mirkin,MD 5:05 PM

## 2013-12-09 ENCOUNTER — Telehealth (HOSPITAL_COMMUNITY): Payer: Self-pay | Admitting: Cardiology

## 2013-12-09 ENCOUNTER — Ambulatory Visit: Payer: Managed Care, Other (non HMO)

## 2013-12-09 DIAGNOSIS — I5022 Chronic systolic (congestive) heart failure: Secondary | ICD-10-CM

## 2013-12-09 LAB — BASIC METABOLIC PANEL
BUN: 14 mg/dL (ref 6–23)
CALCIUM: 9.4 mg/dL (ref 8.4–10.5)
CO2: 30 mEq/L (ref 19–32)
Chloride: 100 mEq/L (ref 96–112)
Creatinine, Ser: 1 mg/dL (ref 0.4–1.2)
GFR: 61.36 mL/min (ref >60.00)
Glucose, Bld: 143 mg/dL — ABNORMAL HIGH (ref 70–99)
Potassium: 3.4 mEq/L — ABNORMAL LOW (ref 3.5–5.1)
Sodium: 138 mEq/L (ref 135–145)

## 2013-12-09 MED ORDER — SPIRONOLACTONE 25 MG PO TABS: 25.0000 mg | ORAL_TABLET | Freq: Every day | ORAL | Status: DC

## 2013-12-09 NOTE — Telephone Encounter (Signed)
Pt aware via husband New rx sent for correct dose and d/c previous dose

## 2013-12-09 NOTE — Telephone Encounter (Signed)
Message copied by JEFFRIES, Sharlot Gowda on Fri Dec 09, 2013  5:42 PM ------      Message from: Glori Bickers R      Created: Fri Dec 09, 2013  4:12 PM       Increase spiro to 25 daily. Recheck 1 week ------

## 2013-12-12 ENCOUNTER — Other Ambulatory Visit (HOSPITAL_COMMUNITY): Payer: Self-pay | Admitting: Cardiology

## 2013-12-12 DIAGNOSIS — I5022 Chronic systolic (congestive) heart failure: Secondary | ICD-10-CM

## 2013-12-16 ENCOUNTER — Other Ambulatory Visit (HOSPITAL_COMMUNITY): Payer: Self-pay

## 2013-12-16 ENCOUNTER — Ambulatory Visit: Payer: Managed Care, Other (non HMO)

## 2013-12-16 DIAGNOSIS — I5022 Chronic systolic (congestive) heart failure: Secondary | ICD-10-CM

## 2013-12-16 LAB — BASIC METABOLIC PANEL
BUN: 17 mg/dL (ref 6–23)
CALCIUM: 9.3 mg/dL (ref 8.4–10.5)
CHLORIDE: 99 meq/L (ref 96–112)
CO2: 29 meq/L (ref 19–32)
CREATININE: 0.8 mg/dL (ref 0.4–1.2)
GFR: 74.48 mL/min (ref >60.00)
GLUCOSE: 126 mg/dL — AB (ref 70–99)
Potassium: 3.8 mEq/L (ref 3.5–5.1)
Sodium: 136 mEq/L (ref 135–145)

## 2013-12-16 MED ORDER — CARVEDILOL 6.25 MG PO TABS: 9.3750 mg | ORAL_TABLET | Freq: Two times a day (BID) | ORAL | Status: DC

## 2013-12-16 MED ORDER — SPIRONOLACTONE 25 MG PO TABS: 25.0000 mg | ORAL_TABLET | Freq: Every day | ORAL | Status: DC

## 2014-01-11 ENCOUNTER — Other Ambulatory Visit (HOSPITAL_COMMUNITY): Payer: Self-pay

## 2014-01-11 DIAGNOSIS — R002 Palpitations: Secondary | ICD-10-CM

## 2014-01-13 ENCOUNTER — Ambulatory Visit (HOSPITAL_COMMUNITY)
Admission: RE | Admit: 2014-01-13 | Discharge: 2014-01-13 | Disposition: A | Discharge status: Home / Self Care | Payer: Managed Care, Other (non HMO) | Source: Ambulatory Visit | Attending: Internal Medicine | Admitting: Internal Medicine

## 2014-01-13 ENCOUNTER — Encounter: Payer: Self-pay | Admitting: Internal Medicine

## 2014-01-13 VITALS — BP 114/60 | HR 74 | Wt 177.0 lb

## 2014-01-13 DIAGNOSIS — I5022 Chronic systolic (congestive) heart failure: Secondary | ICD-10-CM

## 2014-01-13 MED ORDER — CARVEDILOL 6.25 MG PO TABS: ORAL_TABLET | ORAL | Status: DC

## 2014-01-13 NOTE — Progress Notes (Signed)
Patient ID: [name], female   DOB: [birth year], 63 y.o.   MRN: [id number]  PCP: Dr Larose Kells Cardiologist: Dr Wynonia Lawman EP: Dr Caryl Comes   Subjective [name] is a 63 y/o woman with h/o chronic systolic presumed due to chemotherapy-related cardiomyopathy diagnosed with breast cancer in [place] in [place]. Treated with chemo (including adriamycin)/XRT and L mastectomy. Had R breast cancer (unrelated) in 1987. Has been cancer free since. Denies HTN, HL, DM2.  Was first diagnosed with HF in 1988. Had left heart cath she thinks in 2009. 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 Echo 2009 EF 20% with moderate AI and mild to moderate MR. Had ICD placed and then LV lead became nonfunctional. Was feeling worse earlier this year and in 3/14 underwent CRT revision.   Previously on coumadin due to cardiomyopathy and this was recently discontinued.   ECHO 09/2013 with Dr. Wynonia Lawman and EF 10-15% with significant dyssynchrony. Subsequently had LV lead checked and was functioning well.   She returns for follow up. She was evaluated by Dr Stann Mainland 12/30/13 at [facility] for [facility]. She has follow up scheduled in July. During the last visit in the HF clinic she was increased to 25 mg daily. Complains of fatigue. Mild dyspnea with ambulation. Denies orthopnea/PND. Occasionally dizzy. Says on occasion she feels an occasional fluttering in her chest. Weight at home 174-177 pounds.  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     RHC 10/06/13  RA = 11  RV = 54/9/13  PA = 53/27 (38)  PCW = 23  Fick cardiac output/index = 3.8/2.0  Thermo CO/CI = 3.4/1.8  PVR = 3.9 WU (Fick)  FA sat = 98%  PA sat = 62%, 64%  SVC = 59%   CPX 10/25/13  Peak VO2: 15.6 (81.2% predicted peak VO2) VE/VCO2 slope: 36.9 OUES: 1.25 Peak RER: 1.08  Lab 10/06/13 K 2.8 Creatinine 0.83 Labs 10/31/13 K 3.0 Creatinine 0.68  Dig level 0.9 Pro BNP 3906  Labs 11/11/13  K  3.6 Creatinine 0.8 Labs 12/16/13 K 3.8 Creatinine 0.8   Blood type A+   FHX: Had older brother with HF in his 43s. No strong FHx of HF.  SocHx: still working. Non-smoker. Rare ETOH. 3 grown kids.     Past Medical History  Diagnosis Date  . Breast CA     twice first on L breast 1982 w/ chest wall involvment, then a second breast ca on the R in 1987  . Chronic systolic CHF (congestive heart failure)     a. due to Adriamycin,s/p ICD;  b. 01/2013 Gen change and new LV lead - BSX Energen CRT-D BiV ICD, Ser # [id number]  . Nonischemic cardiomyopathy   . Diverticulitis   . Osteopenia   . Renal calculus   . Menopause     since age 70s  . Depression     Past Surgical History  Procedure Laterality Date  . Mastectomy      B  . Oophorectomy      B, per genticists advise at [place]  . Cardiac defibrillator placement      AICD Replaced-5/09    Current Outpatient Prescriptions  Medication Sig Dispense Refill  . ascorbic acid (VITAMIN C) 1000 MG tablet Take 1,000 mg by mouth daily.      . Biotin 5000 MCG CAPS Take 1 capsule by mouth daily.      . carvedilol (COREG) 6.25 MG tablet Take  1.5 tablets (9.375 mg total) by mouth 2 (two) times daily with a meal.  90 tablet  5  . digoxin (LANOXIN) 0.125 MG tablet Take 1 tablet (0.125 mg total) by mouth daily.  30 tablet  3  . furosemide (LASIX) 40 MG tablet Takes two in morning, one in afternoon      . losartan (COZAAR) 25 MG tablet Take 1 tablet (25 mg total) by mouth at bedtime.  30 tablet  6  . multivitamin (THERAGRAN) per tablet Take 1 tablet by mouth daily.       . pantoprazole (PROTONIX) 40 MG tablet TAKE 1 TABLET (40 MG TOTAL) BY MOUTH DAILY. TAKE 30-60 MIN BEFORE FIRST MEAL OF THE DAY  30 tablet  11  . potassium chloride (K-DUR) 10 MEQ tablet Take 3 tablets (30 mEq total) by mouth daily.  90 tablet  3  . Probiotic Product (PROBIOTIC DAILY PO) Take 1 tablet by mouth daily.      . sertraline (ZOLOFT) 25 MG tablet TAKE 1 TABLET BY MOUTH  DAILY  60 tablet  2  . spironolactone (ALDACTONE) 25 MG tablet Take 1 tablet (25 mg total) by mouth daily.  30 tablet  6  . vitamin B-12 (CYANOCOBALAMIN) 1000 MCG tablet Take 1,000 mcg by mouth daily.      Marland Kitchen zolpidem (AMBIEN) 10 MG tablet Take 1 tablet (10 mg total) by mouth at bedtime as needed. For sleep  90 tablet  1   No current facility-administered medications for this encounter.    Allergies  Allergen Reactions  . Atacand [Candesartan]     Fatigue   . Cephalexin     REACTION: itching, hives  . Latex Rash    Review of Systems negative except from HPI and PMH  Physical Exam BP 114/60  Pulse 74  Wt 177 lb (80.287 kg)  SpO2 97% General: Well developed and well nourished. NAD  HEENT:  normal Neck:Supple JVP 5-6 carotids  Lungs: Clear to ausculation Cor: RRR . Soft AI  Ad: obese Soft. NT ND. No hepatosplenomegaly good bowel sounds Extremities: warm No clubbing cyanosis, edema Alert and oriented, grossly normal motor and sensory function Skin:  no rash Neuro: A and O x 3 affect; normal.   Assessment and  Plan  1) Chronic systolic HF, NYHA III.Has [employer] CRT-D ECHO 09/2013 EF 10-15% NYHA IIIb.  Volume status stable.  Continue lasix 40 mg in am and 20 mg in pm.  Continue 25 mg spironolactone daily.  Continue carvedilol 9.375 mg in am and increaes bed time dose to 12.5 mg. Continue digoxin 0.125 mg daily.  Continue losartan 25 mg daily at bed time. Will not use ace due to cough.  Continue spironolactone 25 mg daily.  Reviewed BMET from  12/16/13 Renal function stable  She is followed by [facility] with follow up in July.   2) NICM, likely due to anthracycline toxicity     --EF 10-15%     --s/p CRT-D  3) h/o bilateral breast CA  4) Palpitations- Medtronic interrogated device. Brief NSVT in Jan noted. BB increased today hopefully that will help.  Follow up in 2 months and will need to set up CPX at that time (Late May or June).  CLEGG,AMY NP-C  11:10  AM  Patient seen and examined with Darrick Grinder, NP. We discussed all aspects of the encounter. I agree with the assessment and plan as stated above.   Overall HF stable. NYHA III. CPX and RHC reviewed. She has also  been seen at [facility]. We agree that she is too early for advanced therapies. Will titrate carvedilol as above. Repeat CPX in next few months. ICD interrogated for palpitations and shows brief NSVT. We will continue to follow closely.   Daniel Bensimhon,MD 11:48 PM

## 2014-01-13 NOTE — Patient Instructions (Signed)
Follow up in 2 months  Take 9.375 mg carvedilol in am (1 1/2 tabs) and 12.5 mg in pm (2 tabs)   Do the following things EVERYDAY: 1) Weigh yourself in the morning before breakfast. Write it down and keep it in a log. 2) Take your medicines as prescribed 3) Eat low salt foods-Limit salt (sodium) to 2000 mg per day.  4) Stay as active as you can everyday 5) Limit all fluids for the day to less than 2 liters

## 2014-01-18 ENCOUNTER — Other Ambulatory Visit (HOSPITAL_COMMUNITY): Payer: Self-pay | Admitting: Internal Medicine

## 2014-01-27 ENCOUNTER — Other Ambulatory Visit: Payer: Self-pay

## 2014-02-13 ENCOUNTER — Encounter: Payer: Managed Care, Other (non HMO) | Admitting: Internal Medicine

## 2014-02-23 ENCOUNTER — Telehealth: Payer: Self-pay

## 2014-02-23 NOTE — Telephone Encounter (Signed)
Patient called back to return your phone call.

## 2014-02-23 NOTE — Telephone Encounter (Addendum)
Left message for call back Non identifiable  Medication and allergies:  Reviewed and updated  90 day supply/mail order: na Local pharmacy: [facility] on [address]   Immunizations due:  UTD  A/P:   No changes to FH, PSH or Personal Hx Flu vaccine--10/2013 Tdap--07/2012 Shingles-10/2012 CCS--07/2012--Dr Kaplan--nml--next 2023 Pap--07/2012--nml MMG--mastectomy   To Discuss with Provider: Not at this time

## 2014-02-27 ENCOUNTER — Encounter: Payer: Self-pay | Admitting: Internal Medicine

## 2014-02-27 ENCOUNTER — Ambulatory Visit (INDEPENDENT_AMBULATORY_CARE_PROVIDER_SITE_OTHER): Payer: BC Managed Care – PPO | Admitting: Internal Medicine

## 2014-02-27 VITALS — BP 95/65 | HR 82 | Temp 98.0°F | Ht 64.0 in | Wt 177.0 lb

## 2014-02-27 DIAGNOSIS — Z0000 Encounter for general adult medical examination without abnormal findings: Secondary | ICD-10-CM

## 2014-02-27 NOTE — Progress Notes (Signed)
Pre visit review using our clinic review tool, if applicable. No additional management support is needed unless otherwise documented below in the visit note. 

## 2014-02-27 NOTE — Patient Instructions (Signed)
Get your blood work before you leave   Consider a immunization called Prevnar Tylenol as needed for pain, avoid Motrin   Next visit is for routine check up in 6 months  No need to come back fasting Please make an appointment

## 2014-02-27 NOTE — Assessment & Plan Note (Addendum)
Td 2013 pneumonia 08 prevnar recommended  Had Shingles-10/2012     Cscope 2003, and 11-2011, next 10 years (Dr Deatra Ina) PAP  8--2009   01-2010, 2013 (normal)-- revisit the issyue 2016 MMG--mastectomy  DEXA 2006, osteopenia  DEXA 06-2008: normal -- pt likes to hold additional DEXAs for now   Labs Diet and exercise discussed Has hand pain, no synovitis on exam, likely DJD, recommend Tylenol, avoid Motrin or similar meds.   On SSRIs,doing okay but has a # of  medical challenges and sometimes gets down,  denies need to adjust any medications at this time; Emotional support provided today.

## 2014-02-27 NOTE — Progress Notes (Signed)
Subjective:    Patient ID: [name], female    DOB: 1951-09-27, 63 y.o.   MRN: [id number]  DOS:  02/27/2014 Type of  visit: CPX Occasionally has pain in both shoulders, also at the base of the thumbs  and the right thumb PIP Denies headaches, fever, chills or pain around the hips.   ROS Diet-- better  Exercise-- better, more active  No  CP, SOB No  lower extremity edema No orthopnea  Denies  nausea, vomiting diarrhea Denies  blood in the stools (-) cough, sputum production (-) wheezing, chest congestion No dysuria, gross hematuria, difficulty urinating  On SSRIs sx relatively well controlled     Past Medical History  Diagnosis Date  . Breast CA     twice first on L breast 1982 w/ chest wall involvment, then a second breast ca on the R in 1987  . Chronic systolic CHF (congestive heart failure)     a. due to Adriamycin,s/p ICD;  b. 01/2013 Gen change and new LV lead - BSX Energen CRT-D BiV ICD, Ser # [id number]  . Nonischemic cardiomyopathy   . Diverticulitis   . Osteopenia   . Renal calculus   . Menopause     since age 19s  . Depression     Past Surgical History  Procedure Laterality Date  . Mastectomy      B  . Oophorectomy      B, per genticists advise at [place]  . Cardiac defibrillator placement      AICD Replaced-5/09 and 2014    History   Social History  . Marital Status: Married    Spouse Name: N/A    Number of Children: 3  . Years of Education: N/A   Occupational History  . RETIRED---pre school director      preschool   Social History Main Topics  . Smoking status: Never Smoker   . Smokeless tobacco: Never Used  . Alcohol Use: Yes     Comment: socially/occassionally  . Drug Use: No  . Sexual Activity: Not on file   Other Topics Concern  . Not on file   Social History Narrative   Related to Mr and Mrs Huston Foley, they are my patients as well    Married, 3 children all in [place], 7 Gkids           Family History  Problem Relation  Age of Onset  . Heart attack Neg Hx   . Diabetes Neg Hx   . Colon cancer Neg Hx   . Kidney cancer Brother   . Breast cancer Mother     M and sister       Medication List       This list is accurate as of: 02/27/14 11:59 PM.  Always use your most recent med list.               ascorbic acid 1000 MG tablet  Commonly known as:  VITAMIN C  Take 1,000 mg by mouth daily.     Biotin 5000 MCG Caps  Take 1 capsule by mouth daily.     carvedilol 6.25 MG tablet  Commonly known as:  COREG  Take 9.375 mg in am and 12.5 mg in pm     digoxin 0.125 MG tablet  Commonly known as:  LANOXIN  TAKE 1 TABLET (0.125 MG TOTAL) BY MOUTH DAILY.     furosemide 40 MG tablet  Commonly known as:  LASIX  Takes two in morning,  one in afternoon     ICAPS AREDS FORMULA PO  Take 2 capsules by mouth daily.     losartan 25 MG tablet  Commonly known as:  COZAAR  Take 1 tablet (25 mg total) by mouth at bedtime.     multivitamin per tablet  Take 1 tablet by mouth daily.     pantoprazole 40 MG tablet  Commonly known as:  PROTONIX  TAKE 1 TABLET (40 MG TOTAL) BY MOUTH DAILY. TAKE 30-60 MIN BEFORE FIRST MEAL OF THE DAY     potassium chloride 10 MEQ tablet  Commonly known as:  K-DUR  Take 3 tablets (30 mEq total) by mouth daily.     PROBIOTIC DAILY PO  Take 1 tablet by mouth daily.     sertraline 25 MG tablet  Commonly known as:  ZOLOFT  TAKE 1 TABLET BY MOUTH DAILY     spironolactone 25 MG tablet  Commonly known as:  ALDACTONE  Take 1 tablet (25 mg total) by mouth daily.     vitamin B-12 1000 MCG tablet  Commonly known as:  CYANOCOBALAMIN  Take 1,000 mcg by mouth daily.     zolpidem 10 MG tablet  Commonly known as:  AMBIEN  Take 1 tablet (10 mg total) by mouth at bedtime as needed. For sleep           Objective:   Physical Exam BP 95/65  Pulse 82  Temp(Src) 98 F (36.7 C)  Ht 5\' 4"  (1.626 m)  Wt 177 lb (80.287 kg)  BMI 30.37 kg/m2  SpO2 96%  General -- alert,  well-developed, NAD.  Neck --no thyromegaly , normal carotid pulse  HEENT-- Not pale.  Lungs -- normal respiratory effort, no intercostal retractions, no accessory muscle use, and normal breath sounds.  Heart-- normal rate, regular rhythm, + syst  murmur.  Abdomen-- Not distended, good bowel sounds,soft, non-tender.  Extremities-- no pretibial edema bilaterally ; Hands and wrists without synovitis Neurologic--  alert & oriented X3. Speech normal, gait normal, strength normal in all extremities.  Psych-- Cognition and judgment appear intact. Cooperative with normal attention span and concentration. No anxious or depressed appearing.        Assessment & Plan:

## 2014-02-28 ENCOUNTER — Encounter: Payer: Self-pay | Admitting: *Deleted

## 2014-02-28 LAB — BASIC METABOLIC PANEL
BUN: 17 mg/dL (ref 6–23)
CO2: 26 mEq/L (ref 19–32)
CREATININE: 0.8 mg/dL (ref 0.4–1.2)
Calcium: 9.7 mg/dL (ref 8.4–10.5)
Chloride: 99 mEq/L (ref 96–112)
GFR: 81.25 mL/min (ref >60.00)
GLUCOSE: 91 mg/dL (ref 70–99)
Potassium: 4 mEq/L (ref 3.5–5.1)
Sodium: 135 mEq/L (ref 135–145)

## 2014-02-28 LAB — LIPID PANEL
CHOLESTEROL: 208 mg/dL — AB (ref 0–200)
HDL: 49.4 mg/dL (ref >39.00)
LDL CALC: 128 mg/dL — AB (ref 0–99)
TRIGLYCERIDES: 153 mg/dL — AB (ref 0.0–149.0)
Total CHOL/HDL Ratio: 4
VLDL: 30.6 mg/dL (ref 0.0–40.0)

## 2014-02-28 LAB — ALT: ALT: 27 U/L (ref 0–35)

## 2014-02-28 LAB — AST: AST: 31 U/L (ref 0–37)

## 2014-02-28 LAB — TSH: TSH: 0.79 u[IU]/mL (ref 0.35–5.50)

## 2014-03-03 ENCOUNTER — Encounter: Payer: Self-pay | Admitting: *Deleted

## 2014-03-09 ENCOUNTER — Other Ambulatory Visit (HOSPITAL_COMMUNITY): Payer: BC Managed Care – PPO

## 2014-03-21 ENCOUNTER — Ambulatory Visit (HOSPITAL_COMMUNITY)
Admission: RE | Admit: 2014-03-21 | Discharge: 2014-03-21 | Disposition: A | Discharge status: Home / Self Care | Payer: BC Managed Care – PPO | Source: Ambulatory Visit | Attending: Internal Medicine | Admitting: Internal Medicine

## 2014-03-21 VITALS — BP 104/56 | HR 62 | Wt 179.2 lb

## 2014-03-21 DIAGNOSIS — Z853 Personal history of malignant neoplasm of breast: Secondary | ICD-10-CM

## 2014-03-21 DIAGNOSIS — I5022 Chronic systolic (congestive) heart failure: Secondary | ICD-10-CM | POA: Insufficient documentation

## 2014-03-21 LAB — DIGOXIN LEVEL: Digoxin Level: 0.6 ng/mL — ABNORMAL LOW (ref 0.8–2.0)

## 2014-03-21 MED ORDER — TRAMADOL HCL 50 MG PO TABS: 50.0000 mg | ORAL_TABLET | Freq: Two times a day (BID) | ORAL | Status: DC | PRN

## 2014-03-21 MED ORDER — FUROSEMIDE 40 MG PO TABS: ORAL_TABLET | ORAL | Status: DC

## 2014-03-21 MED ORDER — CARVEDILOL 12.5 MG PO TABS: 12.5000 mg | ORAL_TABLET | Freq: Two times a day (BID) | ORAL | Status: DC

## 2014-03-21 NOTE — Patient Instructions (Signed)
Increase Carvedilol to 12.5 mg Twice daily   Tramadol 50 mg Twice daily as needed, please stay aware from NSAIDS (asa, ibuprofen)   Lab today  Your physician recommends that you schedule a follow-up appointment in: 6 weeks

## 2014-03-21 NOTE — Progress Notes (Signed)
Patient ID: [name], female   DOB: 1951-04-26, 63 y.o.   MRN: [id number]  PCP: Dr Larose Kells Cardiologist: Dr Wynonia Lawman EP: Dr Caryl Comes   Subjective [name] is a 63 y/o woman with h/o chronic systolic presumed due to chemotherapy-related cardiomyopathy diagnosed with breast cancer in 1982 in [place]. Treated with chemo (including adriamycin)/XRT and L mastectomy. Had R breast cancer (unrelated) in 1987. Has been cancer free since. Denies HTN, HL, DM2.  Was first diagnosed with HF in 1988. Had left heart cath she thinks in 2009. 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 Echo 2009 EF 20% with moderate AI and mild to moderate MR. Had [place] ICD placed and then LV lead became nonfunctional. In 3/14, underwent CRT revision.   Previously on coumadin due to cardiomyopathy but discontinued.   ECHO 09/2013 with Dr. Wynonia Lawman and EF 10-15% with significant dyssynchrony. Subsequently had LV lead checked and was functioning well.   She returns for follow up. She was evaluated by Dr Stann Mainland 12/30/13 at [facility] for transplant. She has follow up scheduled in July. She is now retired.  She is short of breath walking up steps or walking 1-2 blocks on flat ground.  She is short of breath with heavier housework.  Occasional mild lightheadedness with standing.  She tolerated recent uptitration of Coreg.  Weight has been stable at home.   LHC/RHC 10/06/13  RA = 11  RV = 54/9/13  PA = 53/27 (38)  PCW = 23  Fick cardiac output/index = 3.8/2.0  Thermo CO/CI = 3.4/1.8  PVR = 3.9 WU (Fick)  FA sat = 98%  PA sat = 62%, 64%  SVC = 59%  Coronaries no significant disease but there was a fistula from small D1 => PA.   CPX 10/25/13  Peak VO2: 15.6 (81.2% predicted peak VO2) VE/VCO2 slope: 36.9 OUES: 1.25 Peak RER: 1.08  Lab 10/06/13 K 2.8 Creatinine 0.83 Labs 10/31/13 K 3.0 Creatinine 0.68  Dig level 0.9 Pro BNP 3906  Labs 11/11/13  K 3.6 Creatinine 0.8 Labs 12/16/13 K 3.8 Creatinine 0.8   Labs 4/15 K 4, creatinine 0.8, LDL 128, TSH normal  Blood type A+   FHX: Had older brother with HF in his 56s. No strong FHx of HF.  SocHx: still working. Non-smoker. Rare ETOH. 3 grown kids.     Past Medical History  Diagnosis Date  . Breast CA     twice first on L breast 1982 w/ chest wall involvment, then a second breast ca on the R in 1987  . Chronic systolic CHF (congestive heart failure)     a. due to Adriamycin,s/p ICD;  b. 01/2013 Gen change and new LV lead - BSX Energen CRT-D BiV ICD, Ser # [id number]  . Nonischemic cardiomyopathy   . Diverticulitis   . Osteopenia   . Renal calculus   . Menopause     since age 42s  . Depression     Past Surgical History  Procedure Laterality Date  . Mastectomy      B  . Oophorectomy      B, per genticists advise at [place]  . Cardiac defibrillator placement      AICD Replaced-5/09 and 2014    Current Outpatient Prescriptions  Medication Sig Dispense Refill  . ascorbic acid (VITAMIN C) 1000 MG tablet Take 1,000 mg by mouth daily.      . Biotin 5000 MCG CAPS Take 1 capsule by mouth daily.      Marland Kitchen  carvedilol (COREG) 12.5 MG tablet Take 1 tablet (12.5 mg total) by mouth 2 (two) times daily with a meal.  60 tablet  5  . digoxin (LANOXIN) 0.125 MG tablet TAKE 1 TABLET (0.125 MG TOTAL) BY MOUTH DAILY.  30 tablet  6  . furosemide (LASIX) 40 MG tablet Take 2 tabs in AM and 1 tab in PM  90 tablet  5  . losartan (COZAAR) 25 MG tablet Take 1 tablet (25 mg total) by mouth at bedtime.  30 tablet  6  . Multiple Vitamins-Minerals (ICAPS AREDS FORMULA PO) Take 2 capsules by mouth daily.      . multivitamin (THERAGRAN) per tablet Take 1 tablet by mouth daily.       . pantoprazole (PROTONIX) 40 MG tablet TAKE 1 TABLET (40 MG TOTAL) BY MOUTH DAILY. TAKE 30-60 MIN BEFORE FIRST MEAL OF THE DAY  30 tablet  11  . potassium chloride (K-DUR) 10 MEQ tablet Take 3 tablets (30 mEq total) by mouth daily.  90 tablet  3  . Probiotic Product (PROBIOTIC DAILY  PO) Take 1 tablet by mouth daily.      . sertraline (ZOLOFT) 25 MG tablet TAKE 1 TABLET BY MOUTH DAILY  60 tablet  2  . spironolactone (ALDACTONE) 25 MG tablet Take 1 tablet (25 mg total) by mouth daily.  30 tablet  6  . vitamin B-12 (CYANOCOBALAMIN) 1000 MCG tablet Take 1,000 mcg by mouth daily.      Marland Kitchen zolpidem (AMBIEN) 10 MG tablet Take 1 tablet (10 mg total) by mouth at bedtime as needed. For sleep  90 tablet  1  . traMADol (ULTRAM) 50 MG tablet Take 1 tablet (50 mg total) by mouth every 12 (twelve) hours as needed.  40 tablet  2   No current facility-administered medications for this encounter.    Allergies  Allergen Reactions  . Atacand [Candesartan]     Fatigue   . Cephalexin     REACTION: itching, hives  . Latex Rash    Review of Systems negative except from HPI and PMH  Physical Exam BP 104/56  Pulse 62  Wt 179 lb 4 oz (81.307 kg)  SpO2 97% General: Well developed and well nourished. NAD  HEENT:  normal Neck:Supple JVP 5-6 carotids  Lungs: Clear to ausculation Cor: RRR . Soft AI  Ad: obese Soft. NT ND. No hepatosplenomegaly good bowel sounds Extremities: warm No clubbing cyanosis, edema Alert and oriented, grossly normal motor and sensory function Skin:  no rash Neuro: A and O x 3 affect; normal.   Assessment and  Plan  1) Chronic systolic HF: Nonischemic cardiomyopathy, possibly anthracycline cardiotoxicity.  LHC 11/14 without significant coronary disease.  NYHA III.  Has [employer] CRT-D. ECHO 09/2013 EF 10-15%.  Volume looks ok today. Undergoing transplant evaluation at [facility].  - Continue current digoxin, will arrange to check digoxin level.  - Continue current losartan and spironolactone.  Recent BMET looked good.  - Continue current Lasix.  - Increase Coreg to 12.5 mg bid.  - Return in 6 wks to continue medication titration, would try to uptitrate losartan at that time.  - Will arrange repeat CPX in 7/15 before followup at [facility].  4) h/o bilateral  breast CA 3) Palpitations: Seem to have resolved for the most part.   Larey Dresser 03/21/2014 3:37 PM

## 2014-04-10 ENCOUNTER — Telehealth: Payer: Self-pay | Admitting: *Deleted

## 2014-04-10 ENCOUNTER — Other Ambulatory Visit (HOSPITAL_COMMUNITY): Payer: Self-pay | Admitting: Adult Health

## 2014-04-10 MED ORDER — ZOLPIDEM TARTRATE 10 MG PO TABS: 10.0000 mg | ORAL_TABLET | Freq: Every evening | ORAL | Status: DC | PRN

## 2014-04-10 NOTE — Telephone Encounter (Signed)
RX for Ambien faxed to [facility]

## 2014-04-10 NOTE — Telephone Encounter (Signed)
Requesting Zolpidem Tart 10mg -Take 1 tablet by mouth at bedtime as needed for sleep. Last refill:09-14-13:#90,1 Last OV:02-27-14 QVZ:DGLO not have one on file. Please advise.//AB/CMA

## 2014-04-10 NOTE — Telephone Encounter (Signed)
Ok RF, will get a UDS on RTC

## 2014-04-26 ENCOUNTER — Other Ambulatory Visit: Payer: Self-pay | Admitting: Internal Medicine

## 2014-04-26 DIAGNOSIS — F32A Depression, unspecified: Secondary | ICD-10-CM

## 2014-04-26 DIAGNOSIS — F329 Major depressive disorder, single episode, unspecified: Secondary | ICD-10-CM

## 2014-04-26 NOTE — Telephone Encounter (Signed)
Refill for Zoloft sent to [facility]

## 2014-05-03 ENCOUNTER — Ambulatory Visit (HOSPITAL_COMMUNITY)
Admission: RE | Admit: 2014-05-03 | Discharge: 2014-05-03 | Disposition: A | Discharge status: Home / Self Care | Payer: BC Managed Care – PPO | Source: Ambulatory Visit | Attending: Internal Medicine | Admitting: Internal Medicine

## 2014-05-03 VITALS — BP 102/54 | HR 74 | Wt 177.5 lb

## 2014-05-03 DIAGNOSIS — I509 Heart failure, unspecified: Secondary | ICD-10-CM | POA: Insufficient documentation

## 2014-05-03 DIAGNOSIS — N200 Calculus of kidney: Secondary | ICD-10-CM | POA: Insufficient documentation

## 2014-05-03 DIAGNOSIS — Z853 Personal history of malignant neoplasm of breast: Secondary | ICD-10-CM | POA: Insufficient documentation

## 2014-05-03 DIAGNOSIS — F329 Major depressive disorder, single episode, unspecified: Secondary | ICD-10-CM | POA: Insufficient documentation

## 2014-05-03 DIAGNOSIS — Z09 Encounter for follow-up examination after completed treatment for conditions other than malignant neoplasm: Secondary | ICD-10-CM | POA: Insufficient documentation

## 2014-05-03 DIAGNOSIS — Z95810 Presence of automatic (implantable) cardiac defibrillator: Secondary | ICD-10-CM | POA: Insufficient documentation

## 2014-05-03 DIAGNOSIS — I5022 Chronic systolic (congestive) heart failure: Secondary | ICD-10-CM | POA: Insufficient documentation

## 2014-05-03 DIAGNOSIS — Z79899 Other long term (current) drug therapy: Secondary | ICD-10-CM | POA: Insufficient documentation

## 2014-05-03 DIAGNOSIS — M899 Disorder of bone, unspecified: Secondary | ICD-10-CM | POA: Insufficient documentation

## 2014-05-03 DIAGNOSIS — M949 Disorder of cartilage, unspecified: Secondary | ICD-10-CM

## 2014-05-03 DIAGNOSIS — I428 Other cardiomyopathies: Secondary | ICD-10-CM | POA: Insufficient documentation

## 2014-05-03 DIAGNOSIS — F3289 Other specified depressive episodes: Secondary | ICD-10-CM | POA: Insufficient documentation

## 2014-05-03 DIAGNOSIS — Z87898 Personal history of other specified conditions: Secondary | ICD-10-CM | POA: Insufficient documentation

## 2014-05-03 MED ORDER — FUROSEMIDE 40 MG PO TABS: ORAL_TABLET | ORAL | Status: DC

## 2014-05-03 MED ORDER — LOSARTAN POTASSIUM 25 MG PO TABS: 25.0000 mg | ORAL_TABLET | Freq: Two times a day (BID) | ORAL | Status: DC

## 2014-05-03 NOTE — Patient Instructions (Signed)
Increase Losartan to 25 mg Twice daily   If your weight is up 3-5 lbs overnight you may take Lasix 80 mg Twice daily for 3 days  Labs in 2 weeks  Your physician has recommended that you have a cardiopulmonary stress test (CPX). CPX testing is a non-invasive measurement of heart and lung function. It replaces a traditional treadmill stress test. This type of test provides a tremendous amount of information that relates not only to your present condition but also for future outcomes. This test combines measurements of you ventilation, respiratory gas exchange in the lungs, electrocardiogram (EKG), blood pressure and physical response before, during, and following an exercise protocol.  Your physician recommends that you schedule a follow-up appointment in: 1 month

## 2014-05-04 NOTE — Progress Notes (Signed)
Patient ID: [name], female   DOB: Jan 28, 1951, 63 y.o.   MRN: [id number]  PCP: Dr Larose Kells Cardiologist: Dr Wynonia Lawman EP: Dr Caryl Comes  Subjective [name] is a 63 y/o woman with h/o chronic systolic presumed due to chemotherapy-related cardiomyopathy diagnosed with breast cancer in 1982 in [place]. Treated with chemo (including adriamycin)/XRT and L mastectomy. Had R breast cancer (unrelated) in 1987. Has been cancer free since. Denies HTN, HL, DM2.  Was first diagnosed with HF in 1988. Had left heart cath she thinks in 2009. 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 Echo 2009 EF 20% with moderate AI and mild to moderate MR. Had [place] ICD placed and then LV lead became nonfunctional. In 3/14, underwent CRT revision.   Previously on coumadin due to cardiomyopathy but discontinued.   ECHO 09/2013 with Dr. Wynonia Lawman and EF 10-15% with significant dyssynchrony. Subsequently had LV lead checked and was functioning well.   She returns for follow up. She was evaluated by Dr Stann Mainland 12/30/13 at [facility] for [facility]. She has followup scheduled in July.  She is stably short of breath walking up steps or walking 1-2 blocks on flat ground.  She is short of breath with heavier housework.  Occasional mild lightheadedness with standing.  Weight has been stable at home and is down 2 lbs on our scale.  She does sleep propped up chronically.  No chest pain or tachypalpitations.   LHC/RHC 10/06/13  RA = 11  RV = 54/9/13  PA = 53/27 (38)  PCW = 23  Fick cardiac output/index = 3.8/2.0  Thermo CO/CI = 3.4/1.8  PVR = 3.9 WU (Fick)  FA sat = 98%  PA sat = 62%, 64%  SVC = 59%  Coronaries no significant disease but there was a fistula from small D1 => PA.   CPX 10/25/13  Peak VO2: 15.6 (81.2% predicted peak VO2) VE/VCO2 slope: 36.9 OUES: 1.25 Peak RER: 1.08  Lab 10/06/13 K 2.8 Creatinine 0.83 Labs 10/31/13 K 3.0 Creatinine 0.68  Dig level 0.9 Pro BNP 3906  Labs 11/11/13  K 3.6  Creatinine 0.8 Labs 12/16/13 K 3.8 Creatinine 0.8  Labs 4/15 K 4, creatinine 0.8, LDL 128, TSH normal, digoxin 0.6  Blood type A+   FHX: Had older brother with HF in his 21s. No strong FHx of HF.   SocHx: Retired. Non-smoker. Rare ETOH. 3 grown kids.    Past Medical History  Diagnosis Date  . Breast CA     twice first on L breast 1982 w/ chest wall involvment, then a second breast ca on the R in 1987  . Chronic systolic CHF (congestive heart failure)     a. due to Adriamycin,s/p ICD;  b. 01/2013 Gen change and new LV lead - BSX Energen CRT-D BiV ICD, Ser # [id number]  . Nonischemic cardiomyopathy   . Diverticulitis   . Osteopenia   . Renal calculus   . Menopause     since age 47s  . Depression     Past Surgical History  Procedure Laterality Date  . Mastectomy      B  . Oophorectomy      B, per genticists advise at [place]  . Cardiac defibrillator placement      AICD Replaced-5/09 and 2014    Current Outpatient Prescriptions  Medication Sig Dispense Refill  . ascorbic acid (VITAMIN C) 1000 MG tablet Take 1,000 mg by mouth daily.      . Biotin 5000 MCG CAPS  Take 1 capsule by mouth daily.      . carvedilol (COREG) 12.5 MG tablet Take 1 tablet (12.5 mg total) by mouth 2 (two) times daily with a meal.  60 tablet  5  . digoxin (LANOXIN) 0.125 MG tablet TAKE 1 TABLET (0.125 MG TOTAL) BY MOUTH DAILY.  30 tablet  6  . furosemide (LASIX) 40 MG tablet Take 2 tabs in AM and 1 tab in PM  90 tablet  5  . losartan (COZAAR) 25 MG tablet Take 1 tablet (25 mg total) by mouth 2 (two) times daily.  60 tablet  6  . Multiple Vitamins-Minerals (ICAPS AREDS FORMULA PO) Take 2 capsules by mouth daily.      . multivitamin (THERAGRAN) per tablet Take 1 tablet by mouth daily.       . pantoprazole (PROTONIX) 40 MG tablet TAKE 1 TABLET (40 MG TOTAL) BY MOUTH DAILY. TAKE 30-60 MIN BEFORE FIRST MEAL OF THE DAY  30 tablet  11  . potassium chloride (K-DUR) 10 MEQ tablet TAKE 3 TABLETS (30 MEQ TOTAL) BY  MOUTH DAILY.  90 tablet  3  . Probiotic Product (PROBIOTIC DAILY PO) Take 1 tablet by mouth daily.      . sertraline (ZOLOFT) 25 MG tablet TAKE 1 TABLET BY MOUTH DAILY  60 tablet  2  . spironolactone (ALDACTONE) 25 MG tablet Take 1 tablet (25 mg total) by mouth daily.  30 tablet  6  . traMADol (ULTRAM) 50 MG tablet Take 1 tablet (50 mg total) by mouth every 12 (twelve) hours as needed.  40 tablet  2  . vitamin B-12 (CYANOCOBALAMIN) 1000 MCG tablet Take 1,000 mcg by mouth daily.      Marland Kitchen zolpidem (AMBIEN) 10 MG tablet Take 1 tablet (10 mg total) by mouth at bedtime as needed. For sleep  90 tablet  1   No current facility-administered medications for this encounter.    Allergies  Allergen Reactions  . Atacand [Candesartan]     Fatigue   . Cephalexin     REACTION: itching, hives  . Latex Rash    Review of Systems negative except from HPI and PMH  Physical Exam BP 102/54  Pulse 74  Wt 177 lb 8 oz (80.513 kg)  SpO2 98% General: Well developed and well nourished. NAD  HEENT:  normal Neck:Supple JVP 5-6 carotids  Lungs: Clear to ausculation Cor: RRR . Soft AI  Ad: obese Soft. NT ND. No hepatosplenomegaly good bowel sounds Extremities: warm No clubbing cyanosis, edema Alert and oriented, grossly normal motor and sensory function Skin:  no rash Neuro: A and O x 3 affect; normal.   Assessment and  Plan  1) Chronic systolic HF: Nonischemic cardiomyopathy, possibly anthracycline cardiotoxicity.  LHC 11/14 without significant coronary disease.  NYHA III.  Has [employer] CRT-D. ECHO 09/2013 EF 10-15%.  Volume looks ok today. Undergoing transplant evaluation at [facility].  - Continue current digoxin, will arrange to check digoxin level with BMET in 2 wks.  - Continue current Coreg and spironolactone.   - Increase losartan to 25 mg bid with BMET in 2 wks.  - Continue current Lasix.   - Will arrange repeat CPX in 7/15 before followup at [facility].  2) h/o bilateral breast CA 3) Palpitations:  Seem to have resolved for the most part.   Loralie Champagne 05/04/2014

## 2014-05-08 ENCOUNTER — Ambulatory Visit (INDEPENDENT_AMBULATORY_CARE_PROVIDER_SITE_OTHER): Payer: BC Managed Care – PPO | Admitting: Internal Medicine

## 2014-05-08 ENCOUNTER — Encounter: Payer: Self-pay | Admitting: Internal Medicine

## 2014-05-08 VITALS — BP 106/73 | HR 70 | Ht 64.0 in | Wt 178.0 lb

## 2014-05-08 DIAGNOSIS — I5022 Chronic systolic (congestive) heart failure: Secondary | ICD-10-CM

## 2014-05-08 DIAGNOSIS — I428 Other cardiomyopathies: Secondary | ICD-10-CM

## 2014-05-08 DIAGNOSIS — Z95810 Presence of automatic (implantable) cardiac defibrillator: Secondary | ICD-10-CM

## 2014-05-08 NOTE — Patient Instructions (Addendum)
Your physician recommends that you continue on your current medications as directed. Please refer to the Current Medication list given to you today.  Your physician has recommended that you have an AV optimization echo. During this procedure, an echocardiogram is performed to optimize the timing of your device using ultrasound and a device programmer. Changes will be made to the device settings to help the heart chambers pump more efficiently. This procedure takes approximately one hour.  Your physician recommends that you schedule a follow-up appointment in: 3 months with device clinic.  Your physician wants you to follow-up in: 1 year with Dr. Caryl Comes.  You will receive a reminder letter in the mail two months in advance. If you don't receive a letter, please call our office to schedule the follow-up appointment.

## 2014-05-08 NOTE — Progress Notes (Signed)
seen    Patient Care Team: Colon Branch, MD as PCP - General Jacolyn Reedy, MD as Consulting Physician (Cardiology)   HPI  [name] is a 63 y.o. female seen in followup for congestive heart failure in the setting of chemotherapy associated cardiomyopathy with previously implanted CRT. She did part of the MADIT CRT protocol; her LV lead had failed and was capped she began to develop worsening symptoms of heart failure in the fall and in the winter 2014  In 3/14 she underwent CRT upgrade.  She is much improved following implantation last month. She is going from class IIIB to class II.   She continues to have some complaints of shortness of breath. She tires when a flight of stairs. She is to be seen at [facility] for consideration of advanced therapies.? Is this premature?  She denies edema.     Past Medical History  Diagnosis Date  . Breast CA     twice first on L breast 1982 w/ chest wall involvment, then a second breast ca on the R in 1987  . Chronic systolic CHF (congestive heart failure)     a. due to Adriamycin,s/p ICD;  b. 01/2013 Gen change and new LV lead - BSX Energen CRT-D BiV ICD, Ser # [id number]  . Nonischemic cardiomyopathy   . Diverticulitis   . Osteopenia   . Renal calculus   . Menopause     since age 36s  . Depression     Past Surgical History  Procedure Laterality Date  . Mastectomy      B  . Oophorectomy      B, per genticists advise at [place]  . Cardiac defibrillator placement      AICD Replaced-5/09 and 2014    Current Outpatient Prescriptions  Medication Sig Dispense Refill  . ascorbic acid (VITAMIN C) 1000 MG tablet Take 1,000 mg by mouth daily.      . Biotin 5000 MCG CAPS Take 1 capsule by mouth daily.      . carvedilol (COREG) 12.5 MG tablet Take 1 tablet (12.5 mg total) by mouth 2 (two) times daily with a meal.  60 tablet  5  . digoxin (LANOXIN) 0.125 MG tablet TAKE 1 TABLET (0.125 MG TOTAL) BY MOUTH DAILY.  30 tablet  6  .  furosemide (LASIX) 40 MG tablet Take 2 tabs in AM and 1 tab in PM  90 tablet  5  . losartan (COZAAR) 25 MG tablet Take 1 tablet (25 mg total) by mouth 2 (two) times daily.  60 tablet  6  . Multiple Vitamins-Minerals (ICAPS AREDS FORMULA PO) Take 2 capsules by mouth daily.      . multivitamin (THERAGRAN) per tablet Take 1 tablet by mouth daily.       . pantoprazole (PROTONIX) 40 MG tablet TAKE 1 TABLET (40 MG TOTAL) BY MOUTH DAILY. TAKE 30-60 MIN BEFORE FIRST MEAL OF THE DAY  30 tablet  11  . potassium chloride (K-DUR) 10 MEQ tablet TAKE 3 TABLETS (30 MEQ TOTAL) BY MOUTH DAILY.  90 tablet  3  . Probiotic Product (PROBIOTIC DAILY PO) Take 1 tablet by mouth daily.      . sertraline (ZOLOFT) 25 MG tablet TAKE 1 TABLET BY MOUTH DAILY  60 tablet  2  . spironolactone (ALDACTONE) 25 MG tablet Take 1 tablet (25 mg total) by mouth daily.  30 tablet  6  . traMADol (ULTRAM) 50 MG tablet Take 1 tablet (50 mg total)  by mouth every 12 (twelve) hours as needed.  40 tablet  2  . vitamin B-12 (CYANOCOBALAMIN) 1000 MCG tablet Take 1,000 mcg by mouth daily.      Marland Kitchen zolpidem (AMBIEN) 10 MG tablet Take 1 tablet (10 mg total) by mouth at bedtime as needed. For sleep  90 tablet  1   No current facility-administered medications for this visit.    Allergies  Allergen Reactions  . Atacand [Candesartan]     Fatigue   . Cephalexin     REACTION: itching, hives  . Latex Rash    Review of Systems negative except from HPI and PMH  Physical Exam BP 106/73  Pulse 70  Ht 5\' 4"  (1.626 m)  Wt 178 lb (80.74 kg)  BMI 30.54 kg/m2 Well developed and well nourished in no acute distress HENT normal E scleral and icterus clear Neck Supple JVP flat; carotids brisk and full Clear to ausculation  Regular rate and rhythm, 2/6 early systolic murmur Soft with active bowel sounds No clubbing cyanosis  Edema Alert and oriented, grossly normal motor and sensory function Skin Warm and Dry  ECG demonstrates P. synchronous  biventricular pacing  Assessment and  Plan  Nonischemic cardiac myopathy  Congestive heart failure chronic systolic  Implantable defibrillator-[employer]-CRT  The patient's device was interrogated.  The information was reviewed. No changes were made in the programming.     She is euvolemic. She continues to suffer from class IIb-IIIa symptoms. We'll undertake an AV optimization echo  Last digoxin level was normal at less than 1.0 metabolic profile is normal both in April.

## 2014-05-15 LAB — MDC_IDC_ENUM_SESS_TYPE_INCLINIC
Brady Statistic RA Percent Paced: 1 % — CL
Date Time Interrogation Session: 20150615040000
HIGH POWER IMPEDANCE MEASURED VALUE: 59 Ohm
HighPow Impedance: 32 Ohm
Implantable Pulse Generator Serial Number: 111235
Lead Channel Impedance Value: 525 Ohm
Lead Channel Impedance Value: 936 Ohm
Lead Channel Pacing Threshold Amplitude: 0.8 V
Lead Channel Pacing Threshold Amplitude: 1 V
Lead Channel Pacing Threshold Pulse Width: 0.4 ms
Lead Channel Pacing Threshold Pulse Width: 0.4 ms
Lead Channel Pacing Threshold Pulse Width: 0.6 ms
Lead Channel Sensing Intrinsic Amplitude: 14.7 mV
Lead Channel Setting Pacing Amplitude: 2 V
Lead Channel Setting Pacing Pulse Width: 0.4 ms
Lead Channel Setting Pacing Pulse Width: 0.6 ms
Lead Channel Setting Sensing Sensitivity: 0.5 mV
MDC IDC MSMT LEADCHNL RA SENSING INTR AMPL: 4 mV
MDC IDC MSMT LEADCHNL RV IMPEDANCE VALUE: 436 Ohm
MDC IDC MSMT LEADCHNL RV PACING THRESHOLD AMPLITUDE: 1.4 V
MDC IDC MSMT LEADCHNL RV SENSING INTR AMPL: 21.8 mV
MDC IDC SET LEADCHNL LV SENSING SENSITIVITY: 1 mV
MDC IDC SET LEADCHNL RA PACING AMPLITUDE: 2 V
MDC IDC SET LEADCHNL RV PACING AMPLITUDE: 2.4 V
MDC IDC SET ZONE DETECTION INTERVAL: 333 ms
MDC IDC STAT BRADY RV PERCENT PACED: 98 %
Zone Setting Detection Interval: 286 ms

## 2014-05-18 ENCOUNTER — Other Ambulatory Visit: Payer: BC Managed Care – PPO

## 2014-05-18 ENCOUNTER — Ambulatory Visit: Payer: BC Managed Care – PPO | Admitting: *Deleted

## 2014-05-18 DIAGNOSIS — I447 Left bundle-branch block, unspecified: Secondary | ICD-10-CM

## 2014-05-18 DIAGNOSIS — I4729 Other ventricular tachycardia: Secondary | ICD-10-CM

## 2014-05-18 DIAGNOSIS — I5022 Chronic systolic (congestive) heart failure: Secondary | ICD-10-CM

## 2014-05-18 DIAGNOSIS — I428 Other cardiomyopathies: Secondary | ICD-10-CM

## 2014-05-18 DIAGNOSIS — I472 Ventricular tachycardia, unspecified: Secondary | ICD-10-CM

## 2014-05-18 LAB — BASIC METABOLIC PANEL
BUN: 13 mg/dL (ref 6–23)
CHLORIDE: 101 meq/L (ref 96–112)
CO2: 28 mEq/L (ref 19–32)
Calcium: 9.9 mg/dL (ref 8.4–10.5)
Creatinine, Ser: 0.8 mg/dL (ref 0.4–1.2)
GFR: 78.8 mL/min (ref >60.00)
GLUCOSE: 100 mg/dL — AB (ref 70–99)
POTASSIUM: 4.1 meq/L (ref 3.5–5.1)
SODIUM: 137 meq/L (ref 135–145)

## 2014-05-19 ENCOUNTER — Other Ambulatory Visit: Payer: Self-pay | Admitting: *Deleted

## 2014-05-19 LAB — DIGOXIN LEVEL: Digoxin Level: 0.7 ng/mL — ABNORMAL LOW (ref 0.8–2.0)

## 2014-05-19 MED ORDER — LOSARTAN POTASSIUM 25 MG PO TABS: 25.0000 mg | ORAL_TABLET | Freq: Two times a day (BID) | ORAL | Status: DC

## 2014-05-22 ENCOUNTER — Other Ambulatory Visit (HOSPITAL_COMMUNITY): Payer: Self-pay | Admitting: *Deleted

## 2014-05-22 MED ORDER — LOSARTAN POTASSIUM 25 MG PO TABS: 25.0000 mg | ORAL_TABLET | Freq: Two times a day (BID) | ORAL | Status: DC

## 2014-05-24 ENCOUNTER — Ambulatory Visit (HOSPITAL_COMMUNITY): Payer: Medicare Other | Attending: Internal Medicine

## 2014-05-24 DIAGNOSIS — R0989 Other specified symptoms and signs involving the circulatory and respiratory systems: Secondary | ICD-10-CM

## 2014-05-24 DIAGNOSIS — R0609 Other forms of dyspnea: Secondary | ICD-10-CM | POA: Diagnosis not present

## 2014-05-24 DIAGNOSIS — I428 Other cardiomyopathies: Secondary | ICD-10-CM | POA: Insufficient documentation

## 2014-05-24 DIAGNOSIS — I5022 Chronic systolic (congestive) heart failure: Secondary | ICD-10-CM | POA: Insufficient documentation

## 2014-05-24 DIAGNOSIS — I509 Heart failure, unspecified: Secondary | ICD-10-CM | POA: Insufficient documentation

## 2014-05-29 ENCOUNTER — Ambulatory Visit (HOSPITAL_COMMUNITY): Payer: Medicare Other | Attending: Internal Medicine | Admitting: Radiology

## 2014-05-29 DIAGNOSIS — I517 Cardiomegaly: Secondary | ICD-10-CM | POA: Diagnosis not present

## 2014-05-29 DIAGNOSIS — Z95810 Presence of automatic (implantable) cardiac defibrillator: Secondary | ICD-10-CM

## 2014-05-29 DIAGNOSIS — I059 Rheumatic mitral valve disease, unspecified: Secondary | ICD-10-CM | POA: Diagnosis not present

## 2014-05-29 DIAGNOSIS — I472 Ventricular tachycardia, unspecified: Secondary | ICD-10-CM | POA: Insufficient documentation

## 2014-05-29 DIAGNOSIS — I5022 Chronic systolic (congestive) heart failure: Secondary | ICD-10-CM | POA: Insufficient documentation

## 2014-05-29 DIAGNOSIS — I079 Rheumatic tricuspid valve disease, unspecified: Secondary | ICD-10-CM | POA: Insufficient documentation

## 2014-05-29 DIAGNOSIS — I359 Nonrheumatic aortic valve disorder, unspecified: Secondary | ICD-10-CM | POA: Diagnosis not present

## 2014-05-29 DIAGNOSIS — Z853 Personal history of malignant neoplasm of breast: Secondary | ICD-10-CM | POA: Insufficient documentation

## 2014-05-29 DIAGNOSIS — I447 Left bundle-branch block, unspecified: Secondary | ICD-10-CM | POA: Insufficient documentation

## 2014-05-29 DIAGNOSIS — I4729 Other ventricular tachycardia: Secondary | ICD-10-CM | POA: Insufficient documentation

## 2014-05-29 NOTE — Progress Notes (Signed)
Echocardiogram performed.  

## 2014-05-31 DIAGNOSIS — I5022 Chronic systolic (congestive) heart failure: Secondary | ICD-10-CM | POA: Diagnosis not present

## 2014-06-05 ENCOUNTER — Encounter (HOSPITAL_COMMUNITY): Payer: BC Managed Care – PPO

## 2014-06-09 DIAGNOSIS — Z85828 Personal history of other malignant neoplasm of skin: Secondary | ICD-10-CM | POA: Diagnosis not present

## 2014-06-12 DIAGNOSIS — M6530 Trigger finger, unspecified finger: Secondary | ICD-10-CM | POA: Diagnosis not present

## 2014-06-12 DIAGNOSIS — M25519 Pain in unspecified shoulder: Secondary | ICD-10-CM | POA: Diagnosis not present

## 2014-06-21 DIAGNOSIS — H353190 Nonexudative age-related macular degeneration, unspecified eye, stage unspecified: Secondary | ICD-10-CM | POA: Diagnosis not present

## 2014-06-21 DIAGNOSIS — H40019 Open angle with borderline findings, low risk, unspecified eye: Secondary | ICD-10-CM | POA: Diagnosis not present

## 2014-06-21 DIAGNOSIS — H35039 Hypertensive retinopathy, unspecified eye: Secondary | ICD-10-CM | POA: Diagnosis not present

## 2014-06-21 DIAGNOSIS — H524 Presbyopia: Secondary | ICD-10-CM | POA: Diagnosis not present

## 2014-06-26 DIAGNOSIS — M25519 Pain in unspecified shoulder: Secondary | ICD-10-CM | POA: Diagnosis not present

## 2014-06-28 DIAGNOSIS — M25519 Pain in unspecified shoulder: Secondary | ICD-10-CM | POA: Diagnosis not present

## 2014-07-03 ENCOUNTER — Ambulatory Visit (HOSPITAL_COMMUNITY)
Admission: RE | Admit: 2014-07-03 | Discharge: 2014-07-03 | Disposition: A | Discharge status: Home / Self Care | Payer: Medicare Other | Source: Ambulatory Visit | Attending: Internal Medicine | Admitting: Internal Medicine

## 2014-07-03 VITALS — BP 96/68 | HR 66 | Wt 182.2 lb

## 2014-07-03 DIAGNOSIS — R0609 Other forms of dyspnea: Secondary | ICD-10-CM | POA: Diagnosis not present

## 2014-07-03 DIAGNOSIS — I509 Heart failure, unspecified: Secondary | ICD-10-CM | POA: Insufficient documentation

## 2014-07-03 DIAGNOSIS — I5022 Chronic systolic (congestive) heart failure: Secondary | ICD-10-CM

## 2014-07-03 DIAGNOSIS — Z853 Personal history of malignant neoplasm of breast: Secondary | ICD-10-CM | POA: Insufficient documentation

## 2014-07-03 DIAGNOSIS — R0989 Other specified symptoms and signs involving the circulatory and respiratory systems: Secondary | ICD-10-CM | POA: Diagnosis not present

## 2014-07-03 LAB — BASIC METABOLIC PANEL
Anion gap: 14 (ref 5–15)
BUN: 13 mg/dL (ref 6–23)
CHLORIDE: 100 meq/L (ref 96–112)
CO2: 26 meq/L (ref 19–32)
Calcium: 9.6 mg/dL (ref 8.4–10.5)
Creatinine, Ser: 0.78 mg/dL (ref 0.50–1.10)
GFR calc Af Amer: >90 mL/min (ref >90)
GFR calc non Af Amer: 86 mL/min — ABNORMAL LOW (ref >90)
GLUCOSE: 104 mg/dL — AB (ref 70–99)
Potassium: 4.2 mEq/L (ref 3.7–5.3)
SODIUM: 140 meq/L (ref 137–147)

## 2014-07-03 LAB — PRO B NATRIURETIC PEPTIDE: Pro B Natriuretic peptide (BNP): 2126 pg/mL — ABNORMAL HIGH (ref 0–125)

## 2014-07-03 NOTE — Patient Instructions (Signed)
Labs today  Your physician recommends that you schedule a follow-up appointment in: 2 months    

## 2014-07-03 NOTE — Addendum Note (Signed)
Encounter addended by: Scarlette Calico, RN on: 07/03/2014 11:42 AM<BR>     Documentation filed: Patient Instructions Section, Orders

## 2014-07-03 NOTE — Progress Notes (Signed)
Patient ID: [name], female   DOB: [birth year], 63 y.o.   MRN: [id number] Patient ID: [name], female   DOB: 07-14-1951, 63 y.o.   MRN: [id number]  PCP: Dr Larose Kells Cardiologist: Dr Wynonia Lawman EP: Dr Caryl Comes  Subjective [name] is a 63 y/o woman with h/o chronic systolic presumed due to chemotherapy-related cardiomyopathy diagnosed with breast cancer in 1982 in [place]. Treated with chemo (including adriamycin)/XRT and L mastectomy. Had R breast cancer (unrelated) in 1987. Has been cancer free since. Denies HTN, HL, DM2.  Was first diagnosed with HF in 1988. Had left heart cath she thinks in 2009. 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 Echo 2009 EF 20% with moderate AI and mild to moderate MR. Had [place] ICD placed and then LV lead became nonfunctional. In 3/14, underwent CRT revision.   Previously on coumadin due to cardiomyopathy but discontinued.   ECHO 09/2013 with Dr. Wynonia Lawman and EF 10-15% with significant dyssynchrony. Subsequently had LV lead checked and was functioning well.  ECHO 7/15 EF 10-15% Moderate to severe RV dysfx  LHC/RHC 10/06/13  RA = 11  RV = 54/9/13  PA = 53/27 (38)  PCW = 23  Fick cardiac output/index = 3.8/2.0  Thermo CO/CI = 3.4/1.8  PVR = 3.9 WU (Fick)  FA sat = 98%  PA sat = 62%, 64%  SVC = 59%  Coronaries no significant disease but there was a fistula from small D1 => PA.   CPX 10/25/13  Peak VO2: 15.6 (81.2% predicted peak VO2) VE/VCO2 slope: 36.9 OUES: 1.25 Peak RER: 1.08  CPX 05/24/14 FVC 3.05 (92%)  FEV1 2.23 (88%)  FEV1/FVC 73%  MVV 92 (100%) Resting HR: 64 Peak HR: 132 (85% age predicted max HR) BP rest: 108/70 BP peak: 130/66 Peak VO2: 16.4 (81.1% predicted peak VO2) VE/VCO2 slope: 33.6 OUES: 1.74 Peak RER: 1.16 Ventilatory Threshold: 12.5 (61.8% predicted peak VO2) VE/MVV: 62% PETCO2 at peak: 31 O2pulse: 12 (109% predicted O2pulse)  She returns for follow up. Losartan increased at last  visit. Overall stable NYHA III symptoms. She is stably short of breath walking up steps or walking 1-2 blocks on flat ground.  She is short of breath with heavier housework.  Occasional mild lightheadedness with standing.  CPX slightly improved. Echo still with significant biventricular dysfunction. Saw Dr. Stann Mainland in July at [facility] and said that she can f/u on PRN basis if/when symptoms worsening.  Weight has been stable at home.  She does sleep propped up chronically.  No chest pain or tachypalpitations.    Lab 10/06/13 K 2.8 Creatinine 0.83 Labs 10/31/13 K 3.0 Creatinine 0.68  Dig level 0.9 Pro BNP 3906  Labs 11/11/13  K 3.6 Creatinine 0.8 Labs 12/16/13 K 3.8 Creatinine 0.8  Labs 4/15 K 4, creatinine 0.8, LDL 128, TSH normal, digoxin 0.6 Labs 6/15 K 4.1 Cr 0.8 digoxin 0.7  Blood type A+   FHX: Had older brother with HF in his 9s. No strong FHx of HF.   SocHx: Retired. Non-smoker. Rare ETOH. 3 grown kids.    Past Medical History  Diagnosis Date  . Breast CA     twice first on L breast 1982 w/ chest wall involvment, then a second breast ca on the R in 1987  . Chronic systolic CHF (congestive heart failure)     a. due to Adriamycin,s/p ICD;  b. 01/2013 Gen change and new LV lead - BSX Energen CRT-D BiV ICD, Ser # [id number]  .  Nonischemic cardiomyopathy   . Diverticulitis   . Osteopenia   . Renal calculus   . Menopause     since age 67s  . Depression     Past Surgical History  Procedure Laterality Date  . Mastectomy      B  . Oophorectomy      B, per genticists advise at [place]  . Cardiac defibrillator placement      AICD Replaced-5/09 and 2014    Current Outpatient Prescriptions  Medication Sig Dispense Refill  . ascorbic acid (VITAMIN C) 1000 MG tablet Take 1,000 mg by mouth daily.      . Biotin 5000 MCG CAPS Take 1 capsule by mouth daily.      . carvedilol (COREG) 12.5 MG tablet Take 1 tablet (12.5 mg total) by mouth 2 (two) times daily with a meal.  60 tablet  5  .  digoxin (LANOXIN) 0.125 MG tablet TAKE 1 TABLET (0.125 MG TOTAL) BY MOUTH DAILY.  30 tablet  6  . furosemide (LASIX) 40 MG tablet Take 2 tabs in AM and 1 tab in PM  90 tablet  5  . losartan (COZAAR) 25 MG tablet Take 1 tablet (25 mg total) by mouth 2 (two) times daily.  180 tablet  3  . Multiple Vitamins-Minerals (ICAPS AREDS FORMULA PO) Take 2 capsules by mouth daily.      . multivitamin (THERAGRAN) per tablet Take 1 tablet by mouth daily.       . pantoprazole (PROTONIX) 40 MG tablet TAKE 1 TABLET (40 MG TOTAL) BY MOUTH DAILY. TAKE 30-60 MIN BEFORE FIRST MEAL OF THE DAY  30 tablet  11  . potassium chloride (K-DUR) 10 MEQ tablet TAKE 3 TABLETS (30 MEQ TOTAL) BY MOUTH DAILY.  90 tablet  3  . Probiotic Product (PROBIOTIC DAILY PO) Take 1 tablet by mouth daily.      . sertraline (ZOLOFT) 25 MG tablet TAKE 1 TABLET BY MOUTH DAILY  60 tablet  2  . spironolactone (ALDACTONE) 25 MG tablet Take 1 tablet (25 mg total) by mouth daily.  30 tablet  6  . traMADol (ULTRAM) 50 MG tablet Take 1 tablet (50 mg total) by mouth every 12 (twelve) hours as needed.  40 tablet  2  . vitamin B-12 (CYANOCOBALAMIN) 1000 MCG tablet Take 1,000 mcg by mouth daily.      Marland Kitchen zolpidem (AMBIEN) 10 MG tablet Take 1 tablet (10 mg total) by mouth at bedtime as needed. For sleep  90 tablet  1   No current facility-administered medications for this encounter.    Allergies  Allergen Reactions  . Atacand [Candesartan]     Fatigue   . Cephalexin     REACTION: itching, hives  . Latex Rash    Review of Systems negative except from HPI and PMH  Physical Exam BP 96/68  Pulse 66  Wt 182 lb 4 oz (82.668 kg)  SpO2 97% General: Well developed and well nourished. NAD  HEENT:  normal Neck:Supple JVP 5-6 carotids  Lungs: Clear to ausculation Cor: RRR . Soft AI  Ad: obese Soft. NT ND. No hepatosplenomegaly good bowel sounds Extremities: warm No clubbing cyanosis, edema Alert and oriented, grossly normal motor and sensory  function Skin:  no rash Neuro: A and O x 3 affect; normal.   Assessment and  Plan  1) Chronic systolic HF: Nonischemic cardiomyopathy, possibly anthracycline cardiotoxicity.  LHC 11/14 without significant coronary disease.  NYHA III.  Has [employer] CRT-D. ECHO 09/2013  EF 10-15%. - Stable NYHA III dyspnea. CPX slightly improved.  - Seen in [facility] and will follow prn - otherwise stable. Will continue current regimen. BP too low to adjust meds or consider switch to [facility] - we discussed what to look for with worsening HF and how the transplant listing process  - check BMET and BNP 2) h/o bilateral breast CA  RTC in 2 months. Call with any increase in symptoms.   Glori Bickers MD 07/03/2014

## 2014-07-06 DIAGNOSIS — M25519 Pain in unspecified shoulder: Secondary | ICD-10-CM | POA: Diagnosis not present

## 2014-07-10 DIAGNOSIS — M25519 Pain in unspecified shoulder: Secondary | ICD-10-CM | POA: Diagnosis not present

## 2014-07-10 DIAGNOSIS — M6530 Trigger finger, unspecified finger: Secondary | ICD-10-CM | POA: Diagnosis not present

## 2014-07-10 DIAGNOSIS — M7500 Adhesive capsulitis of unspecified shoulder: Secondary | ICD-10-CM | POA: Diagnosis not present

## 2014-07-12 ENCOUNTER — Telehealth (HOSPITAL_COMMUNITY): Payer: Self-pay | Admitting: *Deleted

## 2014-07-12 NOTE — Telephone Encounter (Signed)
Pt called for lab results from last week, advised everything was normal

## 2014-07-17 ENCOUNTER — Other Ambulatory Visit (HOSPITAL_COMMUNITY): Payer: Self-pay | Admitting: Internal Medicine

## 2014-08-04 ENCOUNTER — Other Ambulatory Visit (HOSPITAL_COMMUNITY): Payer: Self-pay | Admitting: Internal Medicine

## 2014-08-04 ENCOUNTER — Telehealth (HOSPITAL_COMMUNITY): Payer: Self-pay | Admitting: Vascular Surgery

## 2014-08-04 DIAGNOSIS — I509 Heart failure, unspecified: Secondary | ICD-10-CM

## 2014-08-04 MED ORDER — POTASSIUM CHLORIDE ER 10 MEQ PO TBCR: EXTENDED_RELEASE_TABLET | ORAL | Status: DC

## 2014-08-04 NOTE — Telephone Encounter (Signed)
Refill on Potassium @ [facility]

## 2014-08-04 NOTE — Telephone Encounter (Signed)
As requested refills sent into [place]

## 2014-08-14 DIAGNOSIS — M25519 Pain in unspecified shoulder: Secondary | ICD-10-CM | POA: Diagnosis not present

## 2014-08-17 ENCOUNTER — Telehealth: Payer: Self-pay

## 2014-08-17 NOTE — Telephone Encounter (Signed)
Received fax from [facility] regarding Pts prescription for Zolpidem, form completed by Dr. Larose Kells and faxed back to [facility]. Form placed in scanning folder to be scanned.

## 2014-08-17 NOTE — Telephone Encounter (Signed)
Received PA approval on Zolpidem from Pt by [facility]. Zolpidem approved from 08/15/2014 to 11/23/2014. Approval letter placed in scanning folder to be scanned.

## 2014-08-18 ENCOUNTER — Other Ambulatory Visit (HOSPITAL_COMMUNITY): Payer: Self-pay | Admitting: Internal Medicine

## 2014-08-18 DIAGNOSIS — I509 Heart failure, unspecified: Secondary | ICD-10-CM

## 2014-09-01 ENCOUNTER — Encounter: Payer: Self-pay | Admitting: *Deleted

## 2014-09-05 ENCOUNTER — Ambulatory Visit (HOSPITAL_COMMUNITY)
Admission: RE | Admit: 2014-09-05 | Discharge: 2014-09-05 | Disposition: A | Discharge status: Home / Self Care | Payer: Medicare Other | Source: Ambulatory Visit | Attending: Internal Medicine | Admitting: Internal Medicine

## 2014-09-05 VITALS — BP 110/68 | HR 81 | Wt 183.0 lb

## 2014-09-05 DIAGNOSIS — I429 Cardiomyopathy, unspecified: Secondary | ICD-10-CM | POA: Insufficient documentation

## 2014-09-05 DIAGNOSIS — I5022 Chronic systolic (congestive) heart failure: Secondary | ICD-10-CM | POA: Diagnosis not present

## 2014-09-05 DIAGNOSIS — Z95810 Presence of automatic (implantable) cardiac defibrillator: Secondary | ICD-10-CM | POA: Insufficient documentation

## 2014-09-05 DIAGNOSIS — Z853 Personal history of malignant neoplasm of breast: Secondary | ICD-10-CM | POA: Insufficient documentation

## 2014-09-05 DIAGNOSIS — I428 Other cardiomyopathies: Secondary | ICD-10-CM

## 2014-09-05 LAB — DIGOXIN LEVEL: Digoxin Level: 0.6 ng/mL — ABNORMAL LOW (ref 0.8–2.0)

## 2014-09-05 LAB — BASIC METABOLIC PANEL
ANION GAP: 10 (ref 5–15)
BUN: 14 mg/dL (ref 6–23)
CHLORIDE: 99 meq/L (ref 96–112)
CO2: 29 meq/L (ref 19–32)
Calcium: 10.2 mg/dL (ref 8.4–10.5)
Creatinine, Ser: 0.93 mg/dL (ref 0.50–1.10)
GFR calc Af Amer: 73 mL/min — ABNORMAL LOW (ref >90)
GFR calc non Af Amer: 63 mL/min — ABNORMAL LOW (ref >90)
Glucose, Bld: 101 mg/dL — ABNORMAL HIGH (ref 70–99)
Potassium: 4.7 mEq/L (ref 3.7–5.3)
Sodium: 138 mEq/L (ref 137–147)

## 2014-09-05 MED ORDER — SACUBITRIL-VALSARTAN 24-26 MG PO TABS: 1.0000 | ORAL_TABLET | Freq: Two times a day (BID) | ORAL | Status: DC

## 2014-09-05 NOTE — Patient Instructions (Signed)
Stop Losartan  Start Entresto 24/26 mg Twice daily   Labs today  Lab in 2 weeks (bmet)  We will contact you in 3 months to schedule your next appointment.

## 2014-09-06 ENCOUNTER — Telehealth: Payer: Self-pay | Admitting: Cardiology

## 2014-09-06 NOTE — Progress Notes (Signed)
Patient ID: [name], female   DOB: 1951/01/02, 63 y.o.   MRN: [id number] PCP: Dr Larose Kells EP: Dr Caryl Comes  Subjective [name] is a 63 y/o woman with h/o chronic systolic presumed due to chemotherapy-related cardiomyopathy diagnosed with breast cancer in 1982 [place]. Treated with chemo (including adriamycin)/XRT and L mastectomy. Had R breast cancer (unrelated) in 1987. Has been cancer free since. Denies HTN, HL, DM2.  Was first diagnosed with HF in 1988. Had left heart cath she thinks in 2009. 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 Echo 2009 EF 20% with moderate AI and mild to moderate MR. Had [place]anelle ICD placed and then LV lead became nonfunctional. In 3/14, underwent CRT revision.   Previously on coumadin due to cardiomyopathy but discontinued.   ECHO 09/2013 with Dr. Wynonia Lawman and EF 10-15% with significant dyssynchrony. Subsequently had LV lead checked and was functioning well.  ECHO 7/15 EF 10-15%, moderate to severe RV dysfunction  LHC/RHC 10/06/13  RA = 11  RV = 54/9/13  PA = 53/27 (38)  PCW = 23  Fick cardiac output/index = 3.8/2.0  Thermo CO/CI = 3.4/1.8  PVR = 3.9 WU (Fick)  FA sat = 98%  PA sat = 62%, 64%  SVC = 59%  Coronaries no significant disease but there was a fistula from small D1 => PA.   CPX 10/25/13  Peak VO2: 15.6 (81.2% predicted peak VO2) VE/VCO2 slope: 36.9 OUES: 1.25 Peak RER: 1.08  CPX 05/24/14 FVC 3.05 (92%)  FEV1 2.23 (88%)  FEV1/FVC 73%  MVV 92 (100%) Resting HR: 64 Peak HR: 132 (85% age predicted max HR) BP rest: 108/70 BP peak: 130/66 Peak VO2: 16.4 (81.1% predicted peak VO2) VE/VCO2 slope: 33.6 OUES: 1.74 Peak RER: 1.16 Ventilatory Threshold: 12.5 (61.8% predicted peak VO2) VE/MVV: 62% PETCO2 at peak: 31 O2pulse: 12 (109% predicted O2pulse)  She is stably short of breath walking up steps or walking 1-2 blocks on flat ground.  She is short of breath with heavier housework.  Occasional mild  lightheadedness with standing.  Saw Dr. Stann Mainland at July at [facility] and said that she can f/u on PRN basis if/when symptoms worsening.  Weight has been stable at home.  No orthopnea or PND.  No chest pain or tachypalpitations. SBP 100s-110s.   Lab 10/06/13 K 2.8 Creatinine 0.83 Labs 10/31/13 K 3.0 Creatinine 0.68  Dig level 0.9 Pro BNP 3906  Labs 11/11/13  K 3.6 Creatinine 0.8 Labs 12/16/13 K 3.8 Creatinine 0.8  Labs 4/15 K 4, creatinine 0.8, LDL 128, TSH normal, digoxin 0.6 Labs 6/15 K 4.1 Cr 0.8 digoxin 0.7 Labs 8/15 K 4.2, creatinine 0.78, pBNP 2126  ECG: NSR, BiV paced  Blood type A+   FHX: Had older brother with HF in his 91s. No strong FHx of HF.   SocHx: Retired. Non-smoker. Rare ETOH. 3 grown kids.    Past Medical History  Diagnosis Date  . Breast CA     twice first on L breast 1982 w/ chest wall involvment, then a second breast ca on the R in 1987  . Chronic systolic CHF (congestive heart failure)     a. due to Adriamycin,s/p ICD;  b. 01/2013 Gen change and new LV lead - BSX Energen CRT-D BiV ICD, Ser # [id number]  . Nonischemic cardiomyopathy   . Diverticulitis   . Osteopenia   . Renal calculus   . Menopause     since age 35s  . Depression  Past Surgical History  Procedure Laterality Date  . Mastectomy      B  . Oophorectomy      B, per genticists advise at [place]  . Cardiac defibrillator placement      AICD Replaced-5/09 and 2014    Current Outpatient Prescriptions  Medication Sig Dispense Refill  . ascorbic acid (VITAMIN C) 1000 MG tablet Take 1,000 mg by mouth daily.      . Biotin 5000 MCG CAPS Take 1 capsule by mouth daily.      . carvedilol (COREG) 12.5 MG tablet Take 1 tablet (12.5 mg total) by mouth 2 (two) times daily with a meal.  60 tablet  5  . digoxin (LANOXIN) 0.125 MG tablet TAKE 1 TABLET (0.125 MG TOTAL) BY MOUTH DAILY.  30 tablet  6  . furosemide (LASIX) 40 MG tablet Take 2 tabs in AM and 1 tab in PM  90 tablet  5  . Multiple Vitamins-Minerals  (ICAPS AREDS FORMULA PO) Take 2 capsules by mouth daily.      . multivitamin (THERAGRAN) per tablet Take 1 tablet by mouth daily.       . pantoprazole (PROTONIX) 40 MG tablet TAKE 1 TABLET (40 MG TOTAL) BY MOUTH DAILY. TAKE 30-60 MIN BEFORE FIRST MEAL OF THE DAY  30 tablet  11  . potassium chloride (K-DUR) 10 MEQ tablet TAKE 3 TABLETS (30 MEQ TOTAL) BY MOUTH DAILY.  90 tablet  6  . Probiotic Product (PROBIOTIC DAILY PO) Take 1 tablet by mouth daily.      . sertraline (ZOLOFT) 25 MG tablet TAKE 1 TABLET BY MOUTH DAILY  60 tablet  2  . spironolactone (ALDACTONE) 25 MG tablet TAKE 1 TABLET BY MOUTH DAILY.  30 tablet  6  . traMADol (ULTRAM) 50 MG tablet Take 1 tablet (50 mg total) by mouth every 12 (twelve) hours as needed.  40 tablet  2  . vitamin B-12 (CYANOCOBALAMIN) 1000 MCG tablet Take 1,000 mcg by mouth daily.      Marland Kitchen zolpidem (AMBIEN) 10 MG tablet Take 1 tablet (10 mg total) by mouth at bedtime as needed. For sleep  90 tablet  1  . Sacubitril-Valsartan (ENTRESTO) 24-26 MG TABS Take 1 tablet by mouth 2 (two) times daily.  60 tablet  6   No current facility-administered medications for this encounter.    Allergies  Allergen Reactions  . Atacand [Candesartan]     Fatigue   . Cephalexin     REACTION: itching, hives  . Latex Rash    Review of Systems negative except from HPI and PMH  Physical Exam BP 110/68  Pulse 81  Wt 183 lb (83.008 kg)  SpO2 96% General: Well developed and well nourished. NAD  HEENT:  normal Neck: Supple JVP 5-6, no carotid bruit Lungs: Clear to ausculation Cor: RRR . Soft AI  Ad: obese Soft. NT ND. No hepatosplenomegaly good bowel sounds Extremities: warm No clubbing cyanosis, edema Alert and oriented, grossly normal motor and sensory function Skin:  no rash Neuro: A and O x 3 affect; normal.   Assessment and  Plan  1) Chronic systolic HF: Nonischemic cardiomyopathy, possibly anthracycline cardiotoxicity.  LHC 11/14 without significant coronary disease.   NYHA III, stable.  Has [employer] CRT-D. ECHO 09/2013 EF 10-15%.  Seen in [facility] and will follow prn - Stop losartan, start Entresto 24/26 bid. She will call if she gets dizzy with this change.  - Continue Coreg, digoxin, spironolactone at current doses.  -  BMET/digoxin level today.  BMET in 2 wks.  2) h/o bilateral breast CA  Loralie Champagne MD 09/06/2014

## 2014-09-06 NOTE — Telephone Encounter (Signed)
Pt called and stated that Dr. Wynonia Lawman follows her ICD. Informed pt that I would make a note of this. She verbalized understanding.

## 2014-09-06 NOTE — Addendum Note (Signed)
Encounter addended by: Evalee Mutton, CCT on: 09/06/2014  9:08 AM<BR>     Documentation filed: Charges VN

## 2014-09-11 ENCOUNTER — Ambulatory Visit (INDEPENDENT_AMBULATORY_CARE_PROVIDER_SITE_OTHER): Payer: Medicare Other | Admitting: Internal Medicine

## 2014-09-11 ENCOUNTER — Encounter: Payer: Self-pay | Admitting: Internal Medicine

## 2014-09-11 VITALS — BP 95/62 | HR 57 | Temp 98.3°F | Ht 64.0 in | Wt 186.2 lb

## 2014-09-11 DIAGNOSIS — Z0000 Encounter for general adult medical examination without abnormal findings: Secondary | ICD-10-CM

## 2014-09-11 DIAGNOSIS — Z23 Encounter for immunization: Secondary | ICD-10-CM | POA: Diagnosis not present

## 2014-09-11 DIAGNOSIS — I5022 Chronic systolic (congestive) heart failure: Secondary | ICD-10-CM | POA: Diagnosis not present

## 2014-09-11 NOTE — Progress Notes (Signed)
Subjective:    Patient ID: [name], female    DOB: [birth year], 63 y.o.   MRN: [id number]  DOS:  09/11/2014 Type of visit - description : rov Interval history: Since last visit , Started a new  Medication called entresto, Good compliance, no apparent side effects. Labs reviewed, she is due for a BMP in a couple of weeks due to the recent new  medication.   ROS Denies chest pain or difficulty breathing No nausea, vomiting, diarrhea  Anxiety well controlled on SSRIs   Past Medical History  Diagnosis Date  . Breast CA     twice first on L breast 1982 w/ chest wall involvment, then a second breast ca on the R in 1987  . Chronic systolic CHF (congestive heart failure)     a. due to Adriamycin,s/p ICD;  b. 01/2013 Gen change and new LV lead - BSX Energen CRT-D BiV ICD, Ser # [id number]  . Nonischemic cardiomyopathy   . Diverticulitis   . Osteopenia   . Renal calculus   . Menopause     since age 6s  . Depression     Past Surgical History  Procedure Laterality Date  . Mastectomy      B  . Oophorectomy      B, per genticists advise at [place]  . Cardiac defibrillator placement      AICD Replaced-5/09 and 2014    History   Social History  . Marital Status: Married    Spouse Name: N/A    Number of Children: 3  . Years of Education: N/A   Occupational History  . RETIRED---pre school director      preschool   Social History Main Topics  . Smoking status: Never Smoker   . Smokeless tobacco: Never Used  . Alcohol Use: Yes     Comment: socially/occassionally  . Drug Use: No  . Sexual Activity: Not on file   Other Topics Concern  . Not on file   Social History Narrative   Related to Mr and Mrs Huston Foley, they are my patients as well    Married, 3 children all in [place], 7 Gkids              Medication List       This list is accurate as of: 09/11/14  6:28 PM.  Always use your most recent med list.               ascorbic acid 1000 MG tablet    Commonly known as:  VITAMIN C  Take 1,000 mg by mouth daily.     Biotin 5000 MCG Caps  Take 1 capsule by mouth daily.     carvedilol 12.5 MG tablet  Commonly known as:  COREG  Take 1 tablet (12.5 mg total) by mouth 2 (two) times daily with a meal.     digoxin 0.125 MG tablet  Commonly known as:  LANOXIN  TAKE 1 TABLET (0.125 MG TOTAL) BY MOUTH DAILY.     furosemide 40 MG tablet  Commonly known as:  LASIX  Take 2 tabs in AM and 1 tab in PM     ICAPS AREDS FORMULA PO  Take 2 capsules by mouth daily.     multivitamin per tablet  Take 1 tablet by mouth daily.     pantoprazole 40 MG tablet  Commonly known as:  PROTONIX  TAKE 1 TABLET (40 MG TOTAL) BY MOUTH DAILY. TAKE 30-60 MIN BEFORE FIRST MEAL OF THE  DAY     potassium chloride 10 MEQ tablet  Commonly known as:  K-DUR  TAKE 3 TABLETS (30 MEQ TOTAL) BY MOUTH DAILY.     PROBIOTIC DAILY PO  Take 1 tablet by mouth daily.     Sacubitril-Valsartan 24-26 MG Tabs  Commonly known as:  ENTRESTO  Take 1 tablet by mouth 2 (two) times daily.     sertraline 25 MG tablet  Commonly known as:  ZOLOFT  TAKE 1 TABLET BY MOUTH DAILY     spironolactone 25 MG tablet  Commonly known as:  ALDACTONE  TAKE 1 TABLET BY MOUTH DAILY.     traMADol 50 MG tablet  Commonly known as:  ULTRAM  Take 1 tablet (50 mg total) by mouth every 12 (twelve) hours as needed.     vitamin B-12 1000 MCG tablet  Commonly known as:  CYANOCOBALAMIN  Take 1,000 mcg by mouth daily.     zolpidem 10 MG tablet  Commonly known as:  AMBIEN  Take 1 tablet (10 mg total) by mouth at bedtime as needed. For sleep           Objective:   Physical Exam BP 95/62  Pulse 57  Temp(Src) 98.3 F (36.8 C) (Oral)  Ht 5\' 4"  (1.626 m)  Wt 186 lb 4 oz (84.482 kg)  BMI 31.95 kg/m2  SpO2 100%  General -- alert, well-developed, NAD.  HEENT-- Not pale.   Lungs -- normal respiratory effort, no intercostal retractions, no accessory muscle use, and normal breath sounds.   Heart-- normal rate, regular rhythm, + syst  murmur.  Extremities-- no pretibial edema bilaterally  Neurologic--  alert & oriented X3. Speech normal, gait appropriate for age, strength symmetric and appropriate for age.  Psych-- Cognition and judgment appear intact. Cooperative with normal attention span and concentration. No anxious or depressed appearing.     Assessment & Plan:

## 2014-09-11 NOTE — Assessment & Plan Note (Signed)
High vs regular dose Flu shot discussed , elected regular  prevnar-- today RTC 6 months

## 2014-09-11 NOTE — Progress Notes (Signed)
Pre visit review using our clinic review tool, if applicable. No additional management support is needed unless otherwise documented below in the visit note. 

## 2014-09-11 NOTE — Patient Instructions (Signed)
  Please come back to the office by 02-2015 for a physical exam. Come back fasting

## 2014-09-11 NOTE — Assessment & Plan Note (Signed)
Started entresto, BP slightly low but she is asymptomatic, symptoms of low BP discussed and she is encouraged to communicate with the CHF clinic if they  surface.

## 2014-09-15 ENCOUNTER — Other Ambulatory Visit: Payer: Self-pay | Admitting: Cardiology

## 2014-09-15 ENCOUNTER — Other Ambulatory Visit: Payer: Self-pay | Admitting: Internal Medicine

## 2014-09-15 ENCOUNTER — Telehealth (HOSPITAL_COMMUNITY): Payer: Self-pay | Admitting: Vascular Surgery

## 2014-09-15 MED ORDER — CARVEDILOL 12.5 MG PO TABS: 12.5000 mg | ORAL_TABLET | Freq: Two times a day (BID) | ORAL | Status: DC

## 2014-09-15 NOTE — Telephone Encounter (Signed)
Refill Carvedilol  

## 2014-09-15 NOTE — Telephone Encounter (Signed)
Refill sent in

## 2014-09-19 ENCOUNTER — Ambulatory Visit (HOSPITAL_COMMUNITY)
Admission: RE | Admit: 2014-09-19 | Discharge: 2014-09-19 | Disposition: A | Discharge status: Home / Self Care | Payer: Medicare Other | Source: Ambulatory Visit | Attending: Cardiology | Admitting: Cardiology

## 2014-09-19 DIAGNOSIS — I5022 Chronic systolic (congestive) heart failure: Secondary | ICD-10-CM | POA: Diagnosis not present

## 2014-09-19 LAB — BASIC METABOLIC PANEL
Anion gap: 12 (ref 5–15)
BUN: 17 mg/dL (ref 6–23)
CHLORIDE: 100 meq/L (ref 96–112)
CO2: 28 mEq/L (ref 19–32)
Calcium: 9.8 mg/dL (ref 8.4–10.5)
Creatinine, Ser: 0.93 mg/dL (ref 0.50–1.10)
GFR calc non Af Amer: 63 mL/min — ABNORMAL LOW (ref >90)
GFR, EST AFRICAN AMERICAN: 73 mL/min — AB (ref >90)
Glucose, Bld: 92 mg/dL (ref 70–99)
POTASSIUM: 4.3 meq/L (ref 3.7–5.3)
SODIUM: 140 meq/L (ref 137–147)

## 2014-10-02 ENCOUNTER — Telehealth: Payer: Self-pay | Admitting: Internal Medicine

## 2014-10-02 DIAGNOSIS — M7501 Adhesive capsulitis of right shoulder: Secondary | ICD-10-CM | POA: Diagnosis not present

## 2014-10-02 DIAGNOSIS — M25511 Pain in right shoulder: Secondary | ICD-10-CM | POA: Diagnosis not present

## 2014-10-02 DIAGNOSIS — M25512 Pain in left shoulder: Secondary | ICD-10-CM | POA: Diagnosis not present

## 2014-10-02 DIAGNOSIS — M7502 Adhesive capsulitis of left shoulder: Secondary | ICD-10-CM | POA: Diagnosis not present

## 2014-10-02 DIAGNOSIS — Z9013 Acquired absence of bilateral breasts and nipples: Secondary | ICD-10-CM

## 2014-10-02 NOTE — Telephone Encounter (Signed)
Please advise 

## 2014-10-02 NOTE — Telephone Encounter (Signed)
Caller name: Chaselynn Relation to pt: self Call back number: [phone] Pharmacy:  Reason for call:   Patient needs an rx for prosthesis and bras because of her double mastectomy. [facility] Fax# [phone] or [phone]

## 2014-10-02 NOTE — Telephone Encounter (Signed)
Please proceed with the prescription

## 2014-10-03 NOTE — Telephone Encounter (Signed)
Rx printed, awaiting signature by Dr. Paz.  

## 2014-10-03 NOTE — Telephone Encounter (Signed)
Orders faxed to [facility] as requested by Pt.

## 2014-10-04 ENCOUNTER — Encounter: Payer: Self-pay | Admitting: *Deleted

## 2014-10-18 ENCOUNTER — Other Ambulatory Visit: Payer: Self-pay | Admitting: Cardiology

## 2014-10-26 ENCOUNTER — Telehealth: Payer: Self-pay

## 2014-10-26 MED ORDER — ZOLPIDEM TARTRATE 10 MG PO TABS: 10.0000 mg | ORAL_TABLET | Freq: Every evening | ORAL | Status: DC | PRN

## 2014-10-26 NOTE — Telephone Encounter (Signed)
Pt is requesting refill on Ambien  Last OV: 09/11/2014 Last Fill: 04/10/2014 # [phone] UDS: None  Please advise.

## 2014-10-26 NOTE — Telephone Encounter (Signed)
Printed, signed by Dr. Etter Sjogren, and faxed to [place].

## 2014-10-26 NOTE — Telephone Encounter (Signed)
Refill x1 

## 2014-11-01 ENCOUNTER — Encounter: Payer: Self-pay | Admitting: Internal Medicine

## 2014-11-01 ENCOUNTER — Ambulatory Visit (INDEPENDENT_AMBULATORY_CARE_PROVIDER_SITE_OTHER): Payer: Medicare Other | Admitting: *Deleted

## 2014-11-01 DIAGNOSIS — I5022 Chronic systolic (congestive) heart failure: Secondary | ICD-10-CM | POA: Diagnosis not present

## 2014-11-01 LAB — MDC_IDC_ENUM_SESS_TYPE_INCLINIC
Brady Statistic RA Percent Paced: 1 %
Brady Statistic RV Percent Paced: 98 %
HighPow Impedance: 32 Ohm
HighPow Impedance: 56 Ohm
Implantable Pulse Generator Serial Number: 111235
Lead Channel Impedance Value: 440 Ohm
Lead Channel Impedance Value: 490 Ohm
Lead Channel Pacing Threshold Amplitude: 0.8 V
Lead Channel Pacing Threshold Amplitude: 1 V
Lead Channel Sensing Intrinsic Amplitude: 25 mV
Lead Channel Sensing Intrinsic Amplitude: 3.9 mV
Lead Channel Setting Pacing Amplitude: 2 V
Lead Channel Setting Pacing Amplitude: 2.4 V
Lead Channel Setting Sensing Sensitivity: 1 mV
MDC IDC MSMT LEADCHNL LV IMPEDANCE VALUE: 714 Ohm
MDC IDC MSMT LEADCHNL LV PACING THRESHOLD PULSEWIDTH: 0.6 ms
MDC IDC MSMT LEADCHNL LV SENSING INTR AMPL: 12.9 mV
MDC IDC MSMT LEADCHNL RA PACING THRESHOLD PULSEWIDTH: 0.4 ms
MDC IDC MSMT LEADCHNL RV PACING THRESHOLD AMPLITUDE: 1.4 V
MDC IDC MSMT LEADCHNL RV PACING THRESHOLD PULSEWIDTH: 0.4 ms
MDC IDC SESS DTM: 20151209050000
MDC IDC SET LEADCHNL LV PACING PULSEWIDTH: 0.6 ms
MDC IDC SET LEADCHNL RA PACING AMPLITUDE: 2 V
MDC IDC SET LEADCHNL RV PACING PULSEWIDTH: 0.4 ms
MDC IDC SET LEADCHNL RV SENSING SENSITIVITY: 0.5 mV
MDC IDC SET ZONE DETECTION INTERVAL: 286 ms
Zone Setting Detection Interval: 333 ms

## 2014-11-01 NOTE — Progress Notes (Signed)
CRT-D device check in office. Thresholds and sensing consistent with previous device measurements. Lead impedance trends stable over time. 1 mode switch episodes recorded, SVT. 2 NSVT episodes.  5 PMT episodes.  V-A conduction @ 265msec, PVARP @ 275msec.   Patient bi-ventricularly pacing >98 % of the time. Device programmed with appropriate safety margins. Heart failure diagnostics reviewed and trends are stable for patient. Audible/vibratory alerts demonstrated for patient. No changes made this session. Estimated longevity 8 years.  Patient enrolled in remote follow up. Plan to check device remotely in 3 months and see in office in 6 months. Patient education completed including shock plan.  [place] followed by Dr. Wynonia Lawman.   ROV in June with Dr. Caryl Comes.

## 2014-11-02 ENCOUNTER — Encounter (HOSPITAL_COMMUNITY): Payer: Self-pay | Admitting: Internal Medicine

## 2014-11-15 ENCOUNTER — Ambulatory Visit (HOSPITAL_BASED_OUTPATIENT_CLINIC_OR_DEPARTMENT_OTHER)
Admission: RE | Admit: 2014-11-15 | Discharge: 2014-11-15 | Disposition: A | Discharge status: Home / Self Care | Payer: Medicare Other | Source: Ambulatory Visit | Attending: Medical | Admitting: Medical

## 2014-11-15 ENCOUNTER — Encounter: Payer: Self-pay | Admitting: Medical

## 2014-11-15 ENCOUNTER — Ambulatory Visit (INDEPENDENT_AMBULATORY_CARE_PROVIDER_SITE_OTHER): Payer: Medicare Other | Admitting: Medical

## 2014-11-15 VITALS — BP 98/56 | HR 73 | Temp 98.9°F | Ht 64.0 in | Wt 186.0 lb

## 2014-11-15 DIAGNOSIS — M25569 Pain in unspecified knee: Secondary | ICD-10-CM | POA: Insufficient documentation

## 2014-11-15 DIAGNOSIS — M25561 Pain in right knee: Secondary | ICD-10-CM | POA: Insufficient documentation

## 2014-11-15 DIAGNOSIS — M11261 Other chondrocalcinosis, right knee: Secondary | ICD-10-CM | POA: Insufficient documentation

## 2014-11-15 MED ORDER — DICLOFENAC SODIUM 75 MG PO TBEC
75.0000 mg | DELAYED_RELEASE_TABLET | Freq: Two times a day (BID) | ORAL | Status: DC
Start: 2014-11-15 — End: 2015-05-31

## 2014-11-15 MED ORDER — HYDROCODONE-ACETAMINOPHEN 5-325 MG PO TABS: 1.0000 | ORAL_TABLET | Freq: Four times a day (QID) | ORAL | Status: DC | PRN

## 2014-11-15 NOTE — Assessment & Plan Note (Signed)
For your rt knee pain, I want to get xray today and will rx diclofenac for pain and inflammation. For breakthrough pain, I am prescribing limited number of hydrocodone.  Follow up in 7 days or as needed.  If your pain persists and xray negative,  then consider referral to ortho since you may have internal derangement based on pain in medial tibial plateau area.(possible meniscus injury)

## 2014-11-15 NOTE — Patient Instructions (Signed)
For your rt knee pain, I want to get xray today and will rx diclofenac for pain and inflammation. For breakthrough pain, I am prescribing limited number of hydrocodone.  Follow up in 7 days or as needed.  If your pain persists and xray negative,  then consider referral to ortho since you may have internal derangement based on pain in medial tibial plateau area.(possible meniscus injury)

## 2014-11-15 NOTE — Progress Notes (Signed)
Subjective:    Patient ID: [name], female    DOB: [birth year], 63 y.o.   MRN: [id number]  HPI   Pt has some rt aspect medial knee pain. Pain started a couple of nights ago. Just started to hurt little but now pain is gradually worsening. No fall or trauma. Some pain about 3 months ago same knee. Pain came and went and she was not evaluated at that time. Pain increased going up steps. Twists knee and severe pain. No locking sensation. Pt just taking tylenol for the pain. Sitting has some pain level about 6/10. Pain level 9 on movement. Pt not taking any tramadol. That was for her shoulder in the past. And not taking any nsaids.  Past Medical History  Diagnosis Date  . Breast CA     twice first on L breast 1982 w/ chest wall involvment, then a second breast ca on the R in 1987  . Chronic systolic CHF (congestive heart failure)     a. due to Adriamycin,s/p ICD;  b. 01/2013 Gen change and new LV lead - BSX Energen CRT-D BiV ICD, Ser # [id number]  . Nonischemic cardiomyopathy   . Diverticulitis   . Osteopenia   . Renal calculus   . Menopause     since age 42s  . Depression     History   Social History  . Marital Status: Married    Spouse Name: N/A    Number of Children: 3  . Years of Education: N/A   Occupational History  . RETIRED---pre school director      preschool   Social History Main Topics  . Smoking status: Never Smoker   . Smokeless tobacco: Never Used  . Alcohol Use: Yes     Comment: socially/occassionally  . Drug Use: No  . Sexual Activity: Not on file   Other Topics Concern  . Not on file   Social History Narrative   Related to Mr and Mrs Huston Foley, they are my patients as well    Married, 3 children all in [place], 7 Gkids          Past Surgical History  Procedure Laterality Date  . Mastectomy      B  . Oophorectomy      B, per genticists advise at [place]  . Cardiac defibrillator placement      AICD Replaced-5/09 and 2014  . Venogram N/A  10/20/2012    Procedure: VENOGRAM;  Surgeon: Deboraha Sprang, MD;  Location: [facility] CATH LAB;  Service: Cardiovascular;  Laterality: N/A;  . Bi-ventricular implantable cardioverter defibrillator upgrade N/A 02/04/2013    Procedure: BI-VENTRICULAR IMPLANTABLE CARDIOVERTER DEFIBRILLATOR UPGRADE;  Surgeon: Evans Lance, MD;  Location: [facility] CATH LAB;  Service: Cardiovascular;  Laterality: N/A;  . Left and right heart catheterization with coronary angiogram N/A 10/06/2013    Procedure: LEFT AND RIGHT HEART CATHETERIZATION WITH CORONARY ANGIOGRAM;  Surgeon: Jolaine Artist, MD;  Location: [facility] CATH LAB;  Service: Cardiovascular;  Laterality: N/A;    Family History  Problem Relation Age of Onset  . Heart attack Neg Hx   . Diabetes Neg Hx   . Colon cancer Neg Hx   . Kidney cancer Brother   . Breast cancer Mother     M and sister     Allergies  Allergen Reactions  . Atacand [Candesartan]     Fatigue   . Cephalexin     REACTION: itching, hives  . Latex Rash    Current  Outpatient Prescriptions on File Prior to Visit  Medication Sig Dispense Refill  . ascorbic acid (VITAMIN C) 1000 MG tablet Take 1,000 mg by mouth daily.    . Biotin 5000 MCG CAPS Take 1 capsule by mouth daily.    . carvedilol (COREG) 12.5 MG tablet Take 1 tablet (12.5 mg total) by mouth 2 (two) times daily with a meal. 60 tablet 6  . digoxin (LANOXIN) 0.125 MG tablet TAKE 1 TABLET (0.125 MG TOTAL) BY MOUTH DAILY. 30 tablet 6  . furosemide (LASIX) 40 MG tablet TAKE 2TABLETS BY MOUTH EVERY MORNING AND 1 TAB EVERY EVENING 90 tablet 5  . Multiple Vitamins-Minerals (ICAPS AREDS FORMULA PO) Take 2 capsules by mouth daily.    . multivitamin (THERAGRAN) per tablet Take 1 tablet by mouth daily.     . pantoprazole (PROTONIX) 40 MG tablet TAKE 1 TABLET (40 MG TOTAL) BY MOUTH DAILY. TAKE 30-60 MIN BEFORE FIRST MEAL OF THE DAY 30 tablet 11  . potassium chloride (K-DUR) 10 MEQ tablet TAKE 3 TABLETS (30 MEQ TOTAL) BY MOUTH DAILY. 90 tablet 6    . Probiotic Product (PROBIOTIC DAILY PO) Take 1 tablet by mouth daily.    . sertraline (ZOLOFT) 25 MG tablet TAKE 1 TABLET BY MOUTH DAILY 60 tablet 6  . spironolactone (ALDACTONE) 25 MG tablet TAKE 1 TABLET BY MOUTH DAILY. 30 tablet 6  . traMADol (ULTRAM) 50 MG tablet Take 1 tablet (50 mg total) by mouth every 12 (twelve) hours as needed. 40 tablet 2  . vitamin B-12 (CYANOCOBALAMIN) 1000 MCG tablet Take 1,000 mcg by mouth daily.    Marland Kitchen zolpidem (AMBIEN) 10 MG tablet Take 1 tablet (10 mg total) by mouth at bedtime as needed. For sleep 90 tablet 0   No current facility-administered medications on file prior to visit.    BP 98/56 mmHg  Pulse 73  Temp(Src) 98.9 F (37.2 C) (Oral)  Ht 5\' 4"  (1.626 m)  Wt 186 lb (84.369 kg)  BMI 31.91 kg/m2  SpO2 95%      Review of Systems  Constitutional: Negative for fever, chills and fatigue.  Respiratory: Negative for cough, choking, shortness of breath and wheezing.   Cardiovascular: Negative for chest pain and palpitations.  Musculoskeletal:       Rt knee pain. No popliteal pain.  Hematological: Negative for adenopathy. Does not bruise/bleed easily.       Objective:   Physical Exam  General- No acute distress. Pleasant patient. Lungs- Clear, even and unlabored. Heart- regular rate and rhythm. Neurologic- CNII- XII grossly intact.   Rt knee- medial tibial plateau pain directly on palpation. No swelling, no warmth, no edema. Appears symmetric to lt knee. Neg homans sign. On flexion and extension medial tibial plateau region pain. No crepitus. No severe pain on light touch.       Assessment & Plan:

## 2014-11-15 NOTE — Progress Notes (Signed)
Pre visit review using our clinic review tool, if applicable. No additional management support is needed unless otherwise documented below in the visit note. 

## 2014-12-20 DIAGNOSIS — Z85828 Personal history of other malignant neoplasm of skin: Secondary | ICD-10-CM | POA: Diagnosis not present

## 2014-12-20 DIAGNOSIS — L821 Other seborrheic keratosis: Secondary | ICD-10-CM | POA: Diagnosis not present

## 2014-12-20 DIAGNOSIS — L570 Actinic keratosis: Secondary | ICD-10-CM | POA: Diagnosis not present

## 2014-12-20 DIAGNOSIS — D225 Melanocytic nevi of trunk: Secondary | ICD-10-CM | POA: Diagnosis not present

## 2014-12-20 DIAGNOSIS — Z08 Encounter for follow-up examination after completed treatment for malignant neoplasm: Secondary | ICD-10-CM | POA: Diagnosis not present

## 2014-12-26 DIAGNOSIS — H40013 Open angle with borderline findings, low risk, bilateral: Secondary | ICD-10-CM | POA: Diagnosis not present

## 2015-01-01 DIAGNOSIS — M25511 Pain in right shoulder: Secondary | ICD-10-CM | POA: Diagnosis not present

## 2015-01-01 DIAGNOSIS — M25512 Pain in left shoulder: Secondary | ICD-10-CM | POA: Diagnosis not present

## 2015-01-01 DIAGNOSIS — M25531 Pain in right wrist: Secondary | ICD-10-CM | POA: Diagnosis not present

## 2015-01-01 DIAGNOSIS — M25331 Other instability, right wrist: Secondary | ICD-10-CM | POA: Diagnosis not present

## 2015-02-05 DIAGNOSIS — M7502 Adhesive capsulitis of left shoulder: Secondary | ICD-10-CM | POA: Diagnosis not present

## 2015-02-05 DIAGNOSIS — M25512 Pain in left shoulder: Secondary | ICD-10-CM | POA: Diagnosis not present

## 2015-02-05 DIAGNOSIS — M25331 Other instability, right wrist: Secondary | ICD-10-CM | POA: Diagnosis not present

## 2015-02-19 ENCOUNTER — Other Ambulatory Visit (HOSPITAL_COMMUNITY): Payer: Self-pay

## 2015-02-19 ENCOUNTER — Telehealth (HOSPITAL_COMMUNITY): Payer: Self-pay | Admitting: Vascular Surgery

## 2015-02-19 DIAGNOSIS — I5022 Chronic systolic (congestive) heart failure: Secondary | ICD-10-CM

## 2015-02-19 MED ORDER — SPIRONOLACTONE 25 MG PO TABS: 25.0000 mg | ORAL_TABLET | Freq: Every day | ORAL | Status: DC

## 2015-02-19 NOTE — Telephone Encounter (Signed)
Pt called she called she called in her spironlactone to [facility] last week , the pharmacy states they are waiting to hear back from doctor office... Please advise

## 2015-03-15 ENCOUNTER — Other Ambulatory Visit (HOSPITAL_COMMUNITY): Payer: Self-pay | Admitting: Internal Medicine

## 2015-03-16 LAB — HEPATIC FUNCTION PANEL
ALK PHOS: 74 U/L (ref 25–125)
ALT: 17 U/L (ref 7–35)
AST: 22 U/L (ref 13–35)
BILIRUBIN, TOTAL: 0.4 mg/dL

## 2015-03-16 LAB — LIPID PANEL
Cholesterol: 174 mg/dL (ref 0–200)
HDL: 59 mg/dL (ref 35–70)
LDL CALC: 100 mg/dL
TRIGLYCERIDES: 76 mg/dL (ref 40–160)

## 2015-03-16 LAB — BASIC METABOLIC PANEL
BUN: 16 mg/dL (ref 4–21)
CREATININE: 1 mg/dL (ref <=1.1)
Glucose: 103 mg/dL
Potassium: 4.2 mmol/L (ref 3.4–5.3)
SODIUM: 138 mmol/L (ref 137–147)

## 2015-03-16 LAB — CBC AND DIFFERENTIAL
HCT: 37 % (ref 36–46)
Hemoglobin: 12.8 g/dL (ref 12.0–16.0)
Neutrophils Absolute: 5 /uL
PLATELETS: 245 10*3/uL (ref 150–399)
WBC: 8.2 10^3/mL

## 2015-04-09 DIAGNOSIS — Z95810 Presence of automatic (implantable) cardiac defibrillator: Secondary | ICD-10-CM | POA: Diagnosis not present

## 2015-04-16 ENCOUNTER — Other Ambulatory Visit (HOSPITAL_COMMUNITY): Payer: Self-pay | Admitting: Internal Medicine

## 2015-04-16 ENCOUNTER — Encounter: Payer: Self-pay | Admitting: Internal Medicine

## 2015-04-30 ENCOUNTER — Encounter: Payer: Self-pay | Admitting: Internal Medicine

## 2015-05-09 ENCOUNTER — Telehealth: Payer: Self-pay | Admitting: Internal Medicine

## 2015-05-09 NOTE — Telephone Encounter (Signed)
Pre Visit letter sent  °

## 2015-05-17 ENCOUNTER — Other Ambulatory Visit: Payer: Self-pay | Admitting: Cardiology

## 2015-05-21 ENCOUNTER — Telehealth: Payer: Self-pay

## 2015-05-21 MED ORDER — ZOLPIDEM TARTRATE 10 MG PO TABS: 10.0000 mg | ORAL_TABLET | Freq: Every evening | ORAL | Status: DC | PRN

## 2015-05-21 NOTE — Telephone Encounter (Signed)
Rx faxed to [facility] pharmacy.  

## 2015-05-21 NOTE — Telephone Encounter (Signed)
Rx printed, awaiting MD signature.  

## 2015-05-21 NOTE — Telephone Encounter (Signed)
Okay #90 no refills

## 2015-05-21 NOTE — Telephone Encounter (Signed)
Pt is requesting refill on Ambien.  Last OV: 09/11/2014, CPE scheduled for 05/31/2015 at 0800 Last Fill: 10/26/2014 #[phone] 0RF  Please advise.

## 2015-05-29 ENCOUNTER — Telehealth (HOSPITAL_COMMUNITY): Payer: Self-pay | Admitting: Vascular Surgery

## 2015-05-29 NOTE — Telephone Encounter (Signed)
LEFT PT MESSAGE FOR APPT

## 2015-05-30 ENCOUNTER — Encounter: Payer: Self-pay | Admitting: *Deleted

## 2015-05-30 ENCOUNTER — Telehealth: Payer: Self-pay | Admitting: *Deleted

## 2015-05-30 NOTE — Telephone Encounter (Signed)
Pre-Visit Call completed with patient and chart updated.   Pre-Visit Info documented in Specialty Comments under [employer].    

## 2015-05-30 NOTE — Telephone Encounter (Signed)
Unable to reach patient at time of Pre-Visit Call.  Left message for patient to return call when available.    

## 2015-05-30 NOTE — Addendum Note (Signed)
Addended by: Leticia Penna A on: 05/30/2015 11:25 AM   Modules accepted: Orders, Medications

## 2015-05-31 ENCOUNTER — Encounter: Payer: Self-pay | Admitting: Internal Medicine

## 2015-05-31 ENCOUNTER — Ambulatory Visit (INDEPENDENT_AMBULATORY_CARE_PROVIDER_SITE_OTHER): Payer: Medicare Other | Admitting: Internal Medicine

## 2015-05-31 VITALS — BP 120/64 | HR 72 | Temp 97.9°F | Ht 64.0 in | Wt 191.0 lb

## 2015-05-31 DIAGNOSIS — F329 Major depressive disorder, single episode, unspecified: Secondary | ICD-10-CM

## 2015-05-31 DIAGNOSIS — Z0000 Encounter for general adult medical examination without abnormal findings: Secondary | ICD-10-CM | POA: Diagnosis not present

## 2015-05-31 DIAGNOSIS — I5022 Chronic systolic (congestive) heart failure: Secondary | ICD-10-CM | POA: Diagnosis not present

## 2015-05-31 DIAGNOSIS — R05 Cough: Secondary | ICD-10-CM | POA: Diagnosis not present

## 2015-05-31 DIAGNOSIS — E559 Vitamin D deficiency, unspecified: Secondary | ICD-10-CM

## 2015-05-31 DIAGNOSIS — R059 Cough, unspecified: Secondary | ICD-10-CM

## 2015-05-31 DIAGNOSIS — Z780 Asymptomatic menopausal state: Secondary | ICD-10-CM

## 2015-05-31 DIAGNOSIS — F32A Depression, unspecified: Secondary | ICD-10-CM

## 2015-05-31 LAB — VITAMIN D 25 HYDROXY (VIT D DEFICIENCY, FRACTURES): VITD: 24.62 ng/mL — AB (ref 30.00–100.00)

## 2015-05-31 MED ORDER — SERTRALINE HCL 25 MG PO TABS: 25.0000 mg | ORAL_TABLET | Freq: Every day | ORAL | Status: DC

## 2015-05-31 NOTE — Assessment & Plan Note (Signed)
Currently on low dose of Zoloft and doing very well. No change Insomnia, well-controlled with Ambien as needed, no change, refill as needed.

## 2015-05-31 NOTE — Assessment & Plan Note (Addendum)
Seems to be doing well, labs from 02-2015 at [facility] very good. Continue with present care, check digoxin level

## 2015-05-31 NOTE — Progress Notes (Signed)
Subjective:    Patient ID: [name], female    DOB: [birth year], 64 y.o.   MRN: [id number]  DOS:  05/31/2015 Type of visit - description :  Here for Medicare AWV:  1. Risk factors based on Past M, S, F history: reviewed 2. Physical Activities:  Active at home, yard work 3. Depression/mood: neg screening , on medications 4. Hearing:  No problems noted or reported  5. ADL's: independent, drives  6. Fall Risk: no recent falls, prevention discussed , see AVS 7. home Safety: does feel safe at home  8. Height, weight, & visual acuity: see VS, sees eye doctor regulalrly 9. Counseling: provided 10. Labs ordered based on risk factors: if needed  11. Referral Coordination: if needed 12. Care Plan, see assessment and plan , written personalized plan provided , see AVS 13. Cognitive Assessment: motor skills and cognition appropriate for age 72. Care team updated, see Snap Shot  15. End-of-life care discussed   In addition, today we discussed the following: CHF, good compliance with medications, no concerns. Depression and insomnia: Symptoms well-controlled with sertraline and Ambien which she takes as needed History of chronic cough, asymptomatic for 2 years.    Review of Systems Constitutional: No fever. No chills. No unexplained wt changes. No unusual sweats  HEENT: No dental problems, no ear discharge, no facial swelling, no voice changes. No eye discharge, no eye  redness , no  intolerance to light   Respiratory: No wheezing , no  difficulty breathing. No cough , no mucus production  Cardiovascular: No CP, no leg swelling , no  Palpitations  GI: no nausea, no vomiting, no diarrhea , no  abdominal pain.  No blood in the stools. No dysphagia, no odynophagia    Endocrine: No polyphagia, no polyuria , no polydipsia  GU: No dysuria, gross hematuria, difficulty urinating. No urinary urgency, no frequency.  Musculoskeletal: No joint swellings or unusual aches or  pains  Skin: No change in the color of the skin, palor , no  Rash  Allergic, immunologic: No environmental allergies , no  food allergies  Neurological: No dizziness no  syncope. No headaches. No diplopia, no slurred, no slurred speech, no motor deficits, no facial  Numbness  Hematological: No enlarged lymph nodes, no easy bruising , no unusual bleedings  Psychiatry: No suicidal ideas, no hallucinations, no beavior problems, no confusion.  No unusual/severe anxiety, no depression    Past Medical History  Diagnosis Date  . Breast CA     twice first on L breast 1982 w/ chest wall involvment, then a second breast ca on the R in 1987  . Chronic systolic CHF (congestive heart failure)     a. due to Adriamycin,s/p ICD;  b. 01/2013 Gen change and new LV lead - BSX Energen CRT-D BiV ICD, Ser # [id number]  . Nonischemic cardiomyopathy   . Diverticulitis   . Osteopenia   . Renal calculus   . Menopause     since age 85s  . Depression     Past Surgical History  Procedure Laterality Date  . Mastectomy      B  . Oophorectomy      B, per genticists advise at [place]  . Cardiac defibrillator placement      AICD Replaced-5/09 and 2014  . Venogram N/A 10/20/2012    Procedure: VENOGRAM;  Surgeon: Deboraha Sprang, MD;  Location: [facility] CATH LAB;  Service: Cardiovascular;  Laterality: N/A;  . Bi-ventricular implantable cardioverter defibrillator upgrade  N/A 02/04/2013    Procedure: BI-VENTRICULAR IMPLANTABLE CARDIOVERTER DEFIBRILLATOR UPGRADE;  Surgeon: Evans Lance, MD;  Location: [facility] CATH LAB;  Service: Cardiovascular;  Laterality: N/A;  . Left and right heart catheterization with coronary angiogram N/A 10/06/2013    Procedure: LEFT AND RIGHT HEART CATHETERIZATION WITH CORONARY ANGIOGRAM;  Surgeon: Jolaine Artist, MD;  Location: [facility] CATH LAB;  Service: Cardiovascular;  Laterality: N/A;    History   Social History  . Marital Status: Married    Spouse Name: N/A  . Number of Children: 3  .  Years of Education: N/A   Occupational History  . RETIRED---pre school director      preschool   Social History Main Topics  . Smoking status: Never Smoker   . Smokeless tobacco: Never Used  . Alcohol Use: Yes     Comment: socially/occassionally  . Drug Use: No  . Sexual Activity: Not on file   Other Topics Concern  . Not on file   Social History Narrative   Related to Mr and Mrs Huston Foley, they are my patients as well    Married, 3 children all in [place], 7 Gkids           Family History  Problem Relation Age of Onset  . Heart attack Neg Hx   . Diabetes Neg Hx   . Colon cancer Neg Hx   . Kidney cancer Brother   . Breast cancer Mother     M and sister       Medication List       This list is accurate as of: 05/31/15  2:33 PM.  Always use your most recent med list.               ascorbic acid 1000 MG tablet  Commonly known as:  VITAMIN C  Take 1,000 mg by mouth daily.     Biotin 5000 MCG Caps  Take 1 capsule by mouth daily.     carvedilol 12.5 MG tablet  Commonly known as:  COREG  TAKE 1 TABLET (12.5 MG TOTAL) BY MOUTH 2 (TWO) TIMES DAILY WITH A MEAL.     digoxin 0.125 MG tablet  Commonly known as:  LANOXIN  TAKE 1 TABLET BY MOUTH ONCE A DAY     furosemide 40 MG tablet  Commonly known as:  LASIX  TAKE 2 TABLETS BY MOUTH EVERY MORNING AND 1 TABLET EVERY EVENING     ICAPS AREDS FORMULA PO  Take 2 capsules by mouth daily.     KLOR-CON M10 10 MEQ tablet  Generic drug:  potassium chloride  TAKE 3 TABLETS (30 MEQ TOTAL) BY MOUTH DAILY.     losartan 25 MG tablet  Commonly known as:  COZAAR  TAKE 1 TABLET (25 MG TOTAL) BY MOUTH 2 (TWO) TIMES DAILY.     multivitamin per tablet  Take 1 tablet by mouth daily.     pantoprazole 40 MG tablet  Commonly known as:  PROTONIX  TAKE 1 TABLET (40 MG TOTAL) BY MOUTH DAILY. TAKE 30-60 MIN BEFORE FIRST MEAL OF THE DAY     PROBIOTIC DAILY PO  Take 1 tablet by mouth daily.     sertraline 25 MG tablet  Commonly known  as:  ZOLOFT  Take 1 tablet (25 mg total) by mouth daily.     spironolactone 25 MG tablet  Commonly known as:  ALDACTONE  Take 1 tablet (25 mg total) by mouth daily.     vitamin B-12 1000 MCG tablet  Commonly known as:  CYANOCOBALAMIN  Take 1,000 mcg by mouth daily.     zolpidem 10 MG tablet  Commonly known as:  AMBIEN  Take 1 tablet (10 mg total) by mouth at bedtime as needed for sleep.           Objective:   Physical Exam BP 120/64 mmHg  Pulse 72  Temp(Src) 97.9 F (36.6 C) (Oral)  Ht 5\' 4"  (1.626 m)  Wt 191 lb (86.637 kg)  BMI 32.77 kg/m2  SpO2 95% General:   Well developed, well nourished . NAD.  Neck:  Full range of motion. Supple. No  Thyromegaly  HEENT:  Normocephalic . Face symmetric, atraumatic Lungs:  CTA B Normal respiratory effort, no intercostal retractions, no accessory muscle use. Heart: RRR,  trace systolic murmur.  No pretibial edema bilaterally  Abdomen:  Not distended, soft, non-tender. No rebound or rigidity. No mass,organomegaly Skin: Exposed areas without rash. Not pale. Not jaundice Neurologic:  alert & oriented X3.  Speech normal, gait appropriate for age and unassisted Strength symmetric and appropriate for age.  Psych: Cognition and judgment appear intact.  Cooperative with normal attention span and concentration.  Behavior appropriate. No anxious or depressed appearing.        Assessment & Plan:

## 2015-05-31 NOTE — Patient Instructions (Signed)
Get your blood work before you leave    Protonix: Take 1 tablet every other day, then one tablet twice a week, then stop. If the cough come back, then restart Protonix  Please consider visit these websites for more information:  www.begintheconversation.org  theconversationproject.org    Fall Prevention and Home Safety Falls cause injuries and can affect all age groups. It is possible to use preventive measures to significantly decrease the likelihood of falls. There are many simple measures which can make your home safer and prevent falls. OUTDOORS  Repair cracks and edges of walkways and driveways.  Remove high doorway thresholds.  Trim shrubbery on the main path into your home.  Have good outside lighting.  Clear walkways of tools, rocks, debris, and clutter.  Check that handrails are not broken and are securely fastened. Both sides of steps should have handrails.  Have leaves, snow, and ice cleared regularly.  Use sand or salt on walkways during winter months.  In the garage, clean up grease or oil spills. BATHROOM  Install night lights.  Install grab bars by the toilet and in the tub and shower.  Use non-skid mats or decals in the tub or shower.  Place a plastic non-slip stool in the shower to sit on, if needed.  Keep floors dry and clean up all water on the floor immediately.  Remove soap buildup in the tub or shower on a regular basis.  Secure bath mats with non-slip, double-sided rug tape.  Remove throw rugs and tripping hazards from the floors. BEDROOMS  Install night lights.  Make sure a bedside light is easy to reach.  Do not use oversized bedding.  Keep a telephone by your bedside.  Have a firm chair with side arms to use for getting dressed.  Remove throw rugs and tripping hazards from the floor. KITCHEN  Keep handles on pots and pans turned toward the center of the stove. Use back burners when possible.  Clean up spills quickly and  allow time for drying.  Avoid walking on wet floors.  Avoid hot utensils and knives.  Position shelves so they are not too high or low.  Place commonly used objects within easy reach.  If necessary, use a sturdy step stool with a grab bar when reaching.  Keep electrical cables out of the way.  Do not use floor polish or wax that makes floors slippery. If you must use wax, use non-skid floor wax.  Remove throw rugs and tripping hazards from the floor. STAIRWAYS  Never leave objects on stairs.  Place handrails on both sides of stairways and use them. Fix any loose handrails. Make sure handrails on both sides of the stairways are as long as the stairs.  Check carpeting to make sure it is firmly attached along stairs. Make repairs to worn or loose carpet promptly.  Avoid placing throw rugs at the top or bottom of stairways, or properly secure the rug with carpet tape to prevent slippage. Get rid of throw rugs, if possible.  Have an electrician put in a light switch at the top and bottom of the stairs. OTHER FALL PREVENTION TIPS  Wear low-heel or rubber-soled shoes that are supportive and fit well. Wear closed toe shoes.  When using a stepladder, make sure it is fully opened and both spreaders are firmly locked. Do not climb a closed stepladder.  Add color or contrast paint or tape to grab bars and handrails in your home. Place contrasting color strips on first and  last steps.  Learn and use mobility aids as needed. Install an electrical emergency response system.  Turn on lights to avoid dark areas. Replace light bulbs that burn out immediately. Get light switches that glow.  Arrange furniture to create clear pathways. Keep furniture in the same place.  Firmly attach carpet with non-skid or double-sided tape.  Eliminate uneven floor surfaces.  Select a carpet pattern that does not visually hide the edge of steps.  Be aware of all pets. OTHER HOME SAFETY TIPS  Set the  water temperature for 120 F (48.8 C).  Keep emergency numbers on or near the telephone.  Keep smoke detectors on every level of the home and near sleeping areas. Document Released: 10/31/2002 Document Revised: 05/11/2012 Document Reviewed: 01/30/2012 [facility] Patient Information 2015 [place], [place]. This information is not intended to replace advice given to you by your health care provider. Make sure you discuss any questions you have with your health care provider.   Preventive Care for Adults Ages 73 and over  Blood pressure check.** / Every 1 to 2 years.  Lipid and cholesterol check.**/ Every 5 years beginning at age 41.  Lung cancer screening. / Every year if you are aged 84-80 years and have a 30-pack-year history of smoking and currently smoke or have quit within the past 15 years. Yearly screening is stopped once you have quit smoking for at least 15 years or develop a health problem that would prevent you from having lung cancer treatment.  Fecal occult blood test (FOBT) of stool. / Every year beginning at age 69 and continuing until age 29. You may not have to do this test if you get a colonoscopy every 10 years.  Flexible sigmoidoscopy** or colonoscopy.** / Every 5 years for a flexible sigmoidoscopy or every 10 years for a colonoscopy beginning at age 32 and continuing until age 39.  Hepatitis C blood test.** / For all people born from [birth year] through 1967 and any individual with known risks for hepatitis C.  Abdominal aortic aneurysm (AAA) screening.** / A one-time screening for ages 85 to 56 years who are current or former smokers.  Skin self-exam. / Monthly.  Influenza vaccine. / Every year.  Tetanus, diphtheria, and acellular pertussis (Tdap/Td) vaccine.** / 1 dose of Td every 10 years.  Varicella vaccine.** / Consult your health care provider.  Zoster vaccine.** / 1 dose for adults aged 70 years or older.  Pneumococcal 13-valent conjugate (PCV13) vaccine.** /  Consult your health care provider.  Pneumococcal polysaccharide (PPSV23) vaccine.** / 1 dose for all adults aged 79 years and older.  Meningococcal vaccine.** / Consult your health care provider.  Hepatitis A vaccine.** / Consult your health care provider.  Hepatitis B vaccine.** / Consult your health care provider.  Haemophilus influenzae type b (Hib) vaccine.** / Consult your health care provider. **Family history and personal history of risk and conditions may change your health care provider's recommendations. Document Released: 01/06/2002 Document Revised: 11/15/2013 Document Reviewed: 04/07/2011 [facility] Patient Information 2015 [place], [place]. This information is not intended to replace advice given to you by your health care provider. Make sure you discuss any questions you have with your health care provider.

## 2015-05-31 NOTE — Assessment & Plan Note (Addendum)
Td 2013 pneumonia 2008 prevnar-- 2015  zostavax 2013    Cscope 2003, and 11-2011, next 10 years (Dr Deatra Ina) PAP  8--2009   01-2010, 2013 (normal)-- needs one additional PAP, if negative we can stop per guidelines. Declined to have a Pap smear today, will discuss next year MMG-- h/o mastectomy  DEXA 2006, osteopenia  DEXA 06-2008: normal -- schedule a DEXAs     Labs reviewed, due for a vitamin D Diet and exercise discussed .

## 2015-05-31 NOTE — Assessment & Plan Note (Signed)
Asymptomatic for almost 2 years, we can taper off Protonix, see instructions

## 2015-05-31 NOTE — Progress Notes (Signed)
Pre visit review using our clinic review tool, if applicable. No additional management support is needed unless otherwise documented below in the visit note. 

## 2015-06-01 LAB — DIGOXIN LEVEL

## 2015-06-01 MED ORDER — VITAMIN D (ERGOCALCIFEROL) 1.25 MG (50000 UNIT) PO CAPS: 50000.0000 [IU] | ORAL_CAPSULE | ORAL | Status: DC

## 2015-06-01 NOTE — Addendum Note (Signed)
Addended by: Wilfrid Lund on: 06/01/2015 01:50 PM   Modules accepted: Orders

## 2015-06-13 ENCOUNTER — Ambulatory Visit (HOSPITAL_BASED_OUTPATIENT_CLINIC_OR_DEPARTMENT_OTHER)
Admission: RE | Admit: 2015-06-13 | Discharge: 2015-06-13 | Disposition: A | Discharge status: Home / Self Care | Payer: Medicare Other | Source: Ambulatory Visit | Attending: Internal Medicine | Admitting: Internal Medicine

## 2015-06-13 DIAGNOSIS — Z780 Asymptomatic menopausal state: Secondary | ICD-10-CM | POA: Insufficient documentation

## 2015-06-13 DIAGNOSIS — Z13820 Encounter for screening for osteoporosis: Secondary | ICD-10-CM | POA: Diagnosis not present

## 2015-06-27 DIAGNOSIS — H40013 Open angle with borderline findings, low risk, bilateral: Secondary | ICD-10-CM | POA: Diagnosis not present

## 2015-06-27 DIAGNOSIS — H35033 Hypertensive retinopathy, bilateral: Secondary | ICD-10-CM | POA: Diagnosis not present

## 2015-06-27 DIAGNOSIS — H2513 Age-related nuclear cataract, bilateral: Secondary | ICD-10-CM | POA: Diagnosis not present

## 2015-06-27 DIAGNOSIS — H3531 Nonexudative age-related macular degeneration: Secondary | ICD-10-CM | POA: Diagnosis not present

## 2015-07-02 ENCOUNTER — Ambulatory Visit (HOSPITAL_COMMUNITY)
Admission: RE | Admit: 2015-07-02 | Discharge: 2015-07-02 | Disposition: A | Discharge status: Home / Self Care | Payer: Medicare Other | Source: Ambulatory Visit | Attending: Internal Medicine | Admitting: Internal Medicine

## 2015-07-02 ENCOUNTER — Encounter (HOSPITAL_COMMUNITY): Payer: Self-pay

## 2015-07-02 VITALS — BP 100/62 | HR 60 | Wt 189.1 lb

## 2015-07-02 DIAGNOSIS — Z9221 Personal history of antineoplastic chemotherapy: Secondary | ICD-10-CM | POA: Diagnosis not present

## 2015-07-02 DIAGNOSIS — E119 Type 2 diabetes mellitus without complications: Secondary | ICD-10-CM | POA: Insufficient documentation

## 2015-07-02 DIAGNOSIS — Z79899 Other long term (current) drug therapy: Secondary | ICD-10-CM | POA: Diagnosis not present

## 2015-07-02 DIAGNOSIS — Z853 Personal history of malignant neoplasm of breast: Secondary | ICD-10-CM | POA: Insufficient documentation

## 2015-07-02 DIAGNOSIS — I10 Essential (primary) hypertension: Secondary | ICD-10-CM | POA: Insufficient documentation

## 2015-07-02 DIAGNOSIS — Z95810 Presence of automatic (implantable) cardiac defibrillator: Secondary | ICD-10-CM | POA: Insufficient documentation

## 2015-07-02 DIAGNOSIS — I429 Cardiomyopathy, unspecified: Secondary | ICD-10-CM | POA: Insufficient documentation

## 2015-07-02 DIAGNOSIS — I5022 Chronic systolic (congestive) heart failure: Secondary | ICD-10-CM | POA: Diagnosis not present

## 2015-07-02 NOTE — Addendum Note (Signed)
Encounter addended by: Scarlette Calico, RN on: 07/02/2015 11:52 AM<BR>     Documentation filed: Orders, Dx Association, Patient Instructions Section

## 2015-07-02 NOTE — Patient Instructions (Signed)
Appointment to see Dr Caryl Comes  Your physician has recommended that you have a cardiopulmonary stress test (CPX). CPX testing is a non-invasive measurement of heart and lung function. It replaces a traditional treadmill stress test. This type of test provides a tremendous amount of information that relates not only to your present condition but also for future outcomes. This test combines measurements of you ventilation, respiratory gas exchange in the lungs, electrocardiogram (EKG), blood pressure and physical response before, during, and following an exercise protocol.  Your physician recommends that you schedule a follow-up appointment in: 4 months

## 2015-07-02 NOTE — Progress Notes (Signed)
Patient ID: [name], female   DOB: 30-Aug-1951, 64 y.o.   MRN: [id number] PCP: Dr Larose Kells EP: Dr Caryl Comes  Subjective Ms. Baysinger is a 64 y/o woman with h/o chronic systolic presumed due to chemotherapy-related cardiomyopathy diagnosed with breast cancer in 1982 in [place]. Treated with chemo (including adriamycin)/XRT and L mastectomy. Had R breast cancer (unrelated) in 1987. Has been cancer free since. Denies HTN, HL, DM2.  Was first diagnosed with HF in 1988. Had left heart cath she thinks in 2009. 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 Echo 2009 EF 20% with moderate AI and mild to moderate MR. Had [place] ICD placed and then LV lead became nonfunctional. In 3/14, underwent CRT revision.   Previously on coumadin due to cardiomyopathy but discontinued.   ECHO 09/2013 with Dr. Wynonia Lawman and EF 10-15% with significant dyssynchrony. Subsequently had LV lead checked and was functioning well.  ECHO 7/15 EF 10-15%, moderate to severe RV dysfunction ECHO 4/16 EF 15% normal RV moderate AI  LHC/RHC 10/06/13  RA = 11  RV = 54/9/13  PA = 53/27 (38)  PCW = 23  Fick cardiac output/index = 3.8/2.0  Thermo CO/CI = 3.4/1.8  PVR = 3.9 WU (Fick)  FA sat = 98%  PA sat = 62%, 64%  SVC = 59%  Coronaries no significant disease but there was a fistula from small D1 => PA.   CPX 10/25/13  Peak VO2: 15.6 (81.2% predicted peak VO2) VE/VCO2 slope: 36.9 OUES: 1.25 Peak RER: 1.08  CPX 05/24/14 FVC 3.05 (92%)  FEV1 2.23 (88%)  FEV1/FVC 73%  MVV 92 (100%) Resting HR: 64 Peak HR: 132 (85% age predicted max HR) BP rest: 108/70 BP peak: 130/66 Peak VO2: 16.4 (81.1% predicted peak VO2) VE/VCO2 slope: 33.6 OUES: 1.74 Peak RER: 1.16 Ventilatory Threshold: 12.5 (61.8% predicted peak VO2) VE/MVV: 62% PETCO2 at peak: 31 O2pulse: 12 (109% predicted O2pulse)  She is here for routine f/u. Overall fairly stable. Gets SOB going to mailbox. Able to do ADLs without problem.  PArticipated in the Coping Skills study at [facility]. Echo 03/16/15 with EF 15% RV read as normal. Moderate AI. Weighing every day. Weight stable. No problems with meds. At last visit started [facility] but couldn't afford so remains on losartan  Lab 10/06/13 K 2.8 Creatinine 0.83 Labs 10/31/13 K 3.0 Creatinine 0.68  Dig level 0.9 Pro BNP 3906  Labs 11/11/13  K 3.6 Creatinine 0.8 Labs 12/16/13 K 3.8 Creatinine 0.8  Labs 4/15 K 4, creatinine 0.8, LDL 128, TSH normal, digoxin 0.6 Labs 6/15 K 4.1 Cr 0.8 digoxin 0.7 Labs 8/15 K 4.2, creatinine 0.78, pBNP 2126 Labs 8/15 K 4.2, creatinine 1.0 dig <0.5  ECG: NSR, BiV paced  Blood type A+   FHX: Had older brother with HF in his 8s. No strong FHx of HF.   SocHx: Retired. Non-smoker. Rare ETOH. 3 grown kids.    Past Medical History  Diagnosis Date  . Breast CA     twice first on L breast 1982 w/ chest wall involvment, then a second breast ca on the R in 1987  . Chronic systolic CHF (congestive heart failure)     a. due to Adriamycin,s/p ICD;  b. 01/2013 Gen change and new LV lead - BSX Energen CRT-D BiV ICD, Ser # [id number]  . Nonischemic cardiomyopathy   . Diverticulitis   . Osteopenia   . Renal calculus   . Menopause     since age 26s  .  Depression     Past Surgical History  Procedure Laterality Date  . Mastectomy      B  . Oophorectomy      B, per genticists advise at [place]  . Cardiac defibrillator placement      AICD Replaced-5/09 and 2014  . Venogram N/A 10/20/2012    Procedure: VENOGRAM;  Surgeon: Deboraha Sprang, MD;  Location: [facility] CATH LAB;  Service: Cardiovascular;  Laterality: N/A;  . Bi-ventricular implantable cardioverter defibrillator upgrade N/A 02/04/2013    Procedure: BI-VENTRICULAR IMPLANTABLE CARDIOVERTER DEFIBRILLATOR UPGRADE;  Surgeon: Evans Lance, MD;  Location: [facility] CATH LAB;  Service: Cardiovascular;  Laterality: N/A;  . Left and right heart catheterization with coronary angiogram N/A 10/06/2013    Procedure: LEFT  AND RIGHT HEART CATHETERIZATION WITH CORONARY ANGIOGRAM;  Surgeon: Jolaine Artist, MD;  Location: [facility] CATH LAB;  Service: Cardiovascular;  Laterality: N/A;    Current Outpatient Prescriptions  Medication Sig Dispense Refill  . ascorbic acid (VITAMIN C) 1000 MG tablet Take 1,000 mg by mouth daily.    . Biotin 5000 MCG CAPS Take 1 capsule by mouth daily.    . carvedilol (COREG) 12.5 MG tablet TAKE 1 TABLET (12.5 MG TOTAL) BY MOUTH 2 (TWO) TIMES DAILY WITH A MEAL. 60 tablet 6  . digoxin (LANOXIN) 0.125 MG tablet TAKE 1 TABLET BY MOUTH ONCE A DAY 30 tablet 6  . furosemide (LASIX) 40 MG tablet TAKE 2 TABLETS BY MOUTH EVERY MORNING AND 1 TABLET EVERY EVENING 90 tablet 5  . KLOR-CON M10 10 MEQ tablet TAKE 3 TABLETS (30 MEQ TOTAL) BY MOUTH DAILY. 90 tablet 6  . losartan (COZAAR) 25 MG tablet TAKE 1 TABLET (25 MG TOTAL) BY MOUTH 2 (TWO) TIMES DAILY. 180 tablet 3  . Multiple Vitamins-Minerals (ICAPS AREDS FORMULA PO) Take 2 capsules by mouth daily.    . multivitamin (THERAGRAN) per tablet Take 1 tablet by mouth daily.     . Probiotic Product (PROBIOTIC DAILY PO) Take 1 tablet by mouth daily.    . sertraline (ZOLOFT) 25 MG tablet Take 1 tablet (25 mg total) by mouth daily. 60 tablet 6  . spironolactone (ALDACTONE) 25 MG tablet Take 1 tablet (25 mg total) by mouth daily. 90 tablet 3  . vitamin B-12 (CYANOCOBALAMIN) 1000 MCG tablet Take 1,000 mcg by mouth daily.    . Vitamin D, Ergocalciferol, (DRISDOL) 50000 UNITS CAPS capsule Take 1 capsule (50,000 Units total) by mouth every 7 (seven) days. 12 capsule 0  . zolpidem (AMBIEN) 10 MG tablet Take 1 tablet (10 mg total) by mouth at bedtime as needed for sleep. 90 tablet 0   No current facility-administered medications for this encounter.    Allergies  Allergen Reactions  . Atacand [Candesartan]     Fatigue   . Cephalexin     REACTION: itching, hives  . Latex Rash    Review of Systems negative except from HPI and PMH  Physical Exam BP 100/62  mmHg  Pulse 60  Wt 189 lb 1.9 oz (85.784 kg)  SpO2 98% General: Well developed and well nourished. NAD  HEENT:  normal Neck: Supple JVP 5-6, no carotid bruit Lungs: Clear to ausculation Cor: RRR . Soft AI  Ad: obese Soft. NT ND. No hepatosplenomegaly good bowel sounds Extremities: warm No clubbing cyanosis, edema Alert and oriented, grossly normal motor and sensory function Skin:  no rash Neuro: A and O x 3 affect; normal.   Assessment and  Plan  1) Chronic systolic HF:  Nonischemic cardiomyopathy, possibly anthracycline cardiotoxicity.  LHC 11/14 without significant coronary disease.  NYHA III, stable.  Has [employer] CRT-D. ECHO 09/2013 EF 10-15%. Echo at [facility] 4/16 EF 15% moderate AI.  Seen in [facility] and will follow prn - Will need to f/u on echo at [facility] as previously RV felt to be moderately HK but [facility] echo reports normal RV - Will once again try to stop losartan and start Entresto 24/26 bid. We will fill out paperwork for assistance program. She will call if she gets dizzy with this change.  - Continue Coreg, digoxin, spironolactone at current doses.  - Will need CPX testing to assess for progression and timing of Tx w/u  2) h/o bilateral breast CA 3) ICD - f/u with Dr. Rhunette Croft MD 07/02/2015

## 2015-07-11 ENCOUNTER — Ambulatory Visit (HOSPITAL_COMMUNITY): Payer: Medicare Other | Attending: Internal Medicine

## 2015-07-11 DIAGNOSIS — I5022 Chronic systolic (congestive) heart failure: Secondary | ICD-10-CM | POA: Insufficient documentation

## 2015-07-12 ENCOUNTER — Other Ambulatory Visit (HOSPITAL_COMMUNITY): Payer: Self-pay | Admitting: *Deleted

## 2015-07-12 DIAGNOSIS — I5022 Chronic systolic (congestive) heart failure: Secondary | ICD-10-CM | POA: Diagnosis not present

## 2015-08-24 ENCOUNTER — Encounter: Payer: Self-pay | Admitting: Internal Medicine

## 2015-08-24 ENCOUNTER — Ambulatory Visit (INDEPENDENT_AMBULATORY_CARE_PROVIDER_SITE_OTHER): Payer: Medicare Other | Admitting: Internal Medicine

## 2015-08-24 VITALS — BP 90/64 | HR 73 | Ht 64.0 in | Wt 192.6 lb

## 2015-08-24 DIAGNOSIS — I428 Other cardiomyopathies: Secondary | ICD-10-CM

## 2015-08-24 DIAGNOSIS — G4710 Hypersomnia, unspecified: Secondary | ICD-10-CM

## 2015-08-24 DIAGNOSIS — I5022 Chronic systolic (congestive) heart failure: Secondary | ICD-10-CM | POA: Diagnosis not present

## 2015-08-24 DIAGNOSIS — I447 Left bundle-branch block, unspecified: Secondary | ICD-10-CM

## 2015-08-24 DIAGNOSIS — I429 Cardiomyopathy, unspecified: Secondary | ICD-10-CM

## 2015-08-24 DIAGNOSIS — Z79899 Other long term (current) drug therapy: Secondary | ICD-10-CM | POA: Diagnosis not present

## 2015-08-24 DIAGNOSIS — I4729 Other ventricular tachycardia: Secondary | ICD-10-CM

## 2015-08-24 DIAGNOSIS — Z95810 Presence of automatic (implantable) cardiac defibrillator: Secondary | ICD-10-CM

## 2015-08-24 DIAGNOSIS — I472 Ventricular tachycardia: Secondary | ICD-10-CM

## 2015-08-24 DIAGNOSIS — R5383 Other fatigue: Secondary | ICD-10-CM

## 2015-08-24 DIAGNOSIS — R400 Somnolence: Secondary | ICD-10-CM

## 2015-08-24 LAB — BASIC METABOLIC PANEL
BUN: 11 mg/dL (ref 6–23)
CHLORIDE: 103 meq/L (ref 96–112)
CO2: 31 mEq/L (ref 19–32)
Calcium: 9.6 mg/dL (ref 8.4–10.5)
Creatinine, Ser: 0.83 mg/dL (ref 0.40–1.20)
GFR: 73.06 mL/min (ref >60.00)
GLUCOSE: 108 mg/dL — AB (ref 70–99)
Potassium: 4 mEq/L (ref 3.5–5.1)
Sodium: 139 mEq/L (ref 135–145)

## 2015-08-24 NOTE — Progress Notes (Signed)
seen    Patient Care Team: Colon Branch, MD as PCP - General Jacolyn Reedy, MD as Consulting Physician (Cardiology) Jolaine Artist, MD as Consulting Physician (Cardiology)   HPI  [name] is a 64 y.o. female seen in followup for congestive heart failure in the setting of chemotherapy associated cardiomyopathy with previously implanted CRT. She did part of the [facility] CRT protocol; her LV lead had failed and was capped she began to develop worsening symptoms of heart failure in the fall and in the winter 2014  In 3/14 she underwent CRT upgrade.  She was much improved following implantation when last seen 6/15. She is going from class IIIB to class II.    She remains relatively stable.  She has chronic fatigue. She does not sleep well. She has daytime somnolence.    ECHO 09/2013 with Dr. Wynonia Lawman and EF 10-15% with significant dyssynchrony. Subsequently had LV lead checked and was functioning well.  ECHO 7/15 EF 10-15%, moderate to severe RV dysfunction ECHO 4/16 EF 15% normal RV moderate AI   Past Medical History  Diagnosis Date  . Breast CA     twice first on L breast 1982 w/ chest wall involvment, then a second breast ca on the R in 1987  . Chronic systolic CHF (congestive heart failure)     a. due to Adriamycin,s/p ICD;  b. 01/2013 Gen change and new LV lead - BSX Energen CRT-D BiV ICD, Ser # [id number]  . Nonischemic cardiomyopathy   . Diverticulitis   . Osteopenia   . Renal calculus   . Menopause     since age 89s  . Depression     Past Surgical History  Procedure Laterality Date  . Mastectomy      B  . Oophorectomy      B, per genticists advise at [place]  . Cardiac defibrillator placement      AICD Replaced-5/09 and 2014  . Venogram N/A 10/20/2012    Procedure: VENOGRAM;  Surgeon: Deboraha Sprang, MD;  Location: [facility] CATH LAB;  Service: Cardiovascular;  Laterality: N/A;  . Bi-ventricular implantable cardioverter defibrillator upgrade N/A 02/04/2013      Procedure: BI-VENTRICULAR IMPLANTABLE CARDIOVERTER DEFIBRILLATOR UPGRADE;  Surgeon: Evans Lance, MD;  Location: [facility] CATH LAB;  Service: Cardiovascular;  Laterality: N/A;  . Left and right heart catheterization with coronary angiogram N/A 10/06/2013    Procedure: LEFT AND RIGHT HEART CATHETERIZATION WITH CORONARY ANGIOGRAM;  Surgeon: Jolaine Artist, MD;  Location: [facility] CATH LAB;  Service: Cardiovascular;  Laterality: N/A;    Current Outpatient Prescriptions  Medication Sig Dispense Refill  . ascorbic acid (VITAMIN C) 1000 MG tablet Take 1,000 mg by mouth daily.    . Biotin 5000 MCG CAPS Take 1 capsule by mouth daily.    . carvedilol (COREG) 12.5 MG tablet TAKE 1 TABLET (12.5 MG TOTAL) BY MOUTH 2 (TWO) TIMES DAILY WITH A MEAL. 60 tablet 6  . digoxin (LANOXIN) 0.125 MG tablet TAKE 1 TABLET BY MOUTH ONCE A DAY 30 tablet 6  . furosemide (LASIX) 40 MG tablet TAKE 2 TABLETS BY MOUTH EVERY MORNING AND 1 TABLET EVERY EVENING 90 tablet 5  . KLOR-CON M10 10 MEQ tablet TAKE 3 TABLETS (30 MEQ TOTAL) BY MOUTH DAILY. 90 tablet 6  . losartan (COZAAR) 25 MG tablet TAKE 1 TABLET (25 MG TOTAL) BY MOUTH 2 (TWO) TIMES DAILY. 180 tablet 3  . multivitamin (THERAGRAN) per tablet Take 1 tablet by mouth daily.     Marland Kitchen  Probiotic Product (PROBIOTIC DAILY PO) Take 1 tablet by mouth daily.    . sertraline (ZOLOFT) 25 MG tablet Take 1 tablet (25 mg total) by mouth daily. 60 tablet 6  . spironolactone (ALDACTONE) 25 MG tablet Take 1 tablet (25 mg total) by mouth daily. 90 tablet 3  . vitamin B-12 (CYANOCOBALAMIN) 1000 MCG tablet Take 1,000 mcg by mouth daily.    Marland Kitchen zolpidem (AMBIEN) 10 MG tablet Take 1 tablet (10 mg total) by mouth at bedtime as needed for sleep. 90 tablet 0   No current facility-administered medications for this visit.    Allergies  Allergen Reactions  . Atacand [Candesartan]     Fatigue   . Cephalexin     REACTION: itching, hives  . Latex Rash    Review of Systems negative except from HPI  and PMH  Physical Exam BP 90/64 mmHg  Pulse 73  Ht 5\' 4"  (1.626 m)  Wt 192 lb 9.6 oz (87.363 kg)  BMI 33.04 kg/m2 Well developed and well nourished in no acute distress HENT normal E scleral and icterus clear Neck Supple JVP flat; carotids brisk and full Clear to ausculation Device pocket well healed; without hematoma or erythema.  There is a fluid collection. I suspect this is a seroma. There is no tethering   Regular rate and rhythm, 2/6 early systolic murmur Soft with active bowel sounds No clubbing cyanosis  Edema Alert and oriented, grossly normal motor and sensory function Skin Warm and Dry  ECG demonstrates P. synchronous biventricular pacing  Assessment and  Plan  Nonischemic cardiac myopathy  Daytime somnolence and sleep disordered breathing  Congestive heart failure chronic systolic  Implantable defibrillator-[employer]-CRT  The patient's device was interrogated.  The information was reviewed. No changes were made in the programming.    She is euvolemic. We will continue her on her current medications up titration of which is limited by hypotension.  We'll check a metabolic profile for surveillance of her high-risk medications.  She has a device seroma;   We will arrange a sleep study. With her daytime somnolence and sleep-disordered breathing is at high risk.  With her cardiac issues for sleep apnea

## 2015-08-24 NOTE — Patient Instructions (Addendum)
Medication Instructions:  Your physician recommends that you continue on your current medications as directed. Please refer to the Current Medication list given to you today.  Labwork: Medication surveillance labs today: BMET  Testing/Procedures: Your physician has recommended that you have a sleep study. This test records several body functions during sleep, including: brain activity, eye movement, oxygen and carbon dioxide blood levels, heart rate and rhythm, breathing rate and rhythm, the flow of air through your mouth and nose, snoring, body muscle movements, and chest and belly movement.  Follow-Up: Remote monitoring is used to monitor your Pacemaker of ICD from home. This monitoring reduces the number of office visits required to check your device to one time per year. It allows [place] to keep an eye on the functioning of your device to ensure it is working properly. You are scheduled for a device check from home on 12/01/15. You may send your transmission at any time that day. If you have a wireless device, the transmission will be sent automatically. After your physician reviews your transmission, you will receive a postcard with your next transmission date.  Your physician wants you to follow-up in: 1 year with Dr. Caryl Comes.  You will receive a reminder letter in the mail two months in advance. If you don't receive a letter, please call our office to schedule the follow-up appointment.   Any Other Special Instructions Will Be Listed Below (If Applicable). Thank you for choosing [facility]!!

## 2015-08-30 LAB — CUP PACEART INCLINIC DEVICE CHECK
Date Time Interrogation Session: 20160930040000
HighPow Impedance: 32 Ohm
HighPow Impedance: 55 Ohm
Lead Channel Impedance Value: 434 Ohm
Lead Channel Impedance Value: 496 Ohm
Lead Channel Impedance Value: 850 Ohm
Lead Channel Pacing Threshold Amplitude: 1.4 V
Lead Channel Pacing Threshold Pulse Width: 0.4 ms
Lead Channel Pacing Threshold Pulse Width: 0.6 ms
Lead Channel Sensing Intrinsic Amplitude: 15.8 mV
Lead Channel Sensing Intrinsic Amplitude: 3.9 mV
Lead Channel Setting Pacing Amplitude: 2 V
Lead Channel Setting Pacing Amplitude: 2 V
Lead Channel Setting Pacing Amplitude: 2.4 V
Lead Channel Setting Pacing Pulse Width: 0.4 ms
Lead Channel Setting Sensing Sensitivity: 1 mV
MDC IDC MSMT LEADCHNL LV PACING THRESHOLD AMPLITUDE: 1.3 V
MDC IDC MSMT LEADCHNL RA PACING THRESHOLD AMPLITUDE: 0.6 V
MDC IDC MSMT LEADCHNL RA PACING THRESHOLD PULSEWIDTH: 0.4 ms
MDC IDC MSMT LEADCHNL RV SENSING INTR AMPL: 25 mV — AB
MDC IDC PG SERIAL: 111235
MDC IDC SET LEADCHNL LV PACING PULSEWIDTH: 0.6 ms
MDC IDC SET LEADCHNL RV SENSING SENSITIVITY: 0.5 mV
Zone Setting Detection Interval: 286 ms
Zone Setting Detection Interval: 333 ms

## 2015-09-18 DIAGNOSIS — M7051 Other bursitis of knee, right knee: Secondary | ICD-10-CM | POA: Diagnosis not present

## 2015-09-18 DIAGNOSIS — M1711 Unilateral primary osteoarthritis, right knee: Secondary | ICD-10-CM | POA: Diagnosis not present

## 2015-09-18 DIAGNOSIS — M6281 Muscle weakness (generalized): Secondary | ICD-10-CM | POA: Diagnosis not present

## 2015-09-21 DIAGNOSIS — Z23 Encounter for immunization: Secondary | ICD-10-CM | POA: Diagnosis not present

## 2015-09-26 DIAGNOSIS — M1711 Unilateral primary osteoarthritis, right knee: Secondary | ICD-10-CM | POA: Diagnosis not present

## 2015-10-02 ENCOUNTER — Telehealth: Payer: Self-pay

## 2015-10-02 NOTE — Telephone Encounter (Signed)
Ok 90, no RF 

## 2015-10-02 NOTE — Telephone Encounter (Signed)
Pt is requesting refill on Ambien.  Last OV: 05/31/2015, no future appt scheduled Last Fill: 05/21/2015 #90 and 0RF UDS: Not needed  Please advise.

## 2015-10-03 MED ORDER — ZOLPIDEM TARTRATE 10 MG PO TABS
10.0000 mg | ORAL_TABLET | Freq: Every evening | ORAL | Status: DC | PRN
Start: 2015-10-03 — End: 2016-03-12

## 2015-10-03 NOTE — Telephone Encounter (Signed)
Rx printed, awaiting MD signature.  

## 2015-10-03 NOTE — Telephone Encounter (Signed)
Rx faxed to [facility] pharmacy.  

## 2015-10-22 ENCOUNTER — Other Ambulatory Visit (HOSPITAL_COMMUNITY): Payer: Self-pay | Admitting: Internal Medicine

## 2015-10-31 DIAGNOSIS — M7051 Other bursitis of knee, right knee: Secondary | ICD-10-CM | POA: Diagnosis not present

## 2015-10-31 DIAGNOSIS — M6281 Muscle weakness (generalized): Secondary | ICD-10-CM | POA: Diagnosis not present

## 2015-10-31 DIAGNOSIS — M1711 Unilateral primary osteoarthritis, right knee: Secondary | ICD-10-CM | POA: Diagnosis not present

## 2015-11-03 ENCOUNTER — Other Ambulatory Visit (HOSPITAL_COMMUNITY): Payer: Self-pay | Admitting: Internal Medicine

## 2015-11-06 ENCOUNTER — Telehealth: Payer: Self-pay | Admitting: Internal Medicine

## 2015-11-06 NOTE — Telephone Encounter (Signed)
Pt will be due for routine follow-up in March 2017, please call Pt to schedule. Thank you.

## 2015-11-06 NOTE — Telephone Encounter (Signed)
Scheduled appt for patient 02/06/2016

## 2015-11-06 NOTE — Telephone Encounter (Signed)
Pt's last OV was 05/31/2015 for CPE, did not see recommended F/U date. Please advise.

## 2015-11-06 NOTE — Telephone Encounter (Signed)
Immunization record updated.

## 2015-11-06 NOTE — Telephone Encounter (Signed)
Schedule a routine checkup in ~ 3 months

## 2015-11-06 NOTE — Telephone Encounter (Signed)
Patient had flu shot early November @ [facility]

## 2015-11-20 ENCOUNTER — Ambulatory Visit (HOSPITAL_BASED_OUTPATIENT_CLINIC_OR_DEPARTMENT_OTHER): Payer: Medicare Other | Attending: Internal Medicine | Admitting: Radiology

## 2015-11-20 VITALS — Ht 64.0 in | Wt 191.0 lb

## 2015-11-20 DIAGNOSIS — Z6833 Body mass index (BMI) 33.0-33.9, adult: Secondary | ICD-10-CM | POA: Insufficient documentation

## 2015-11-20 DIAGNOSIS — G4719 Other hypersomnia: Secondary | ICD-10-CM | POA: Diagnosis not present

## 2015-11-20 DIAGNOSIS — R5383 Other fatigue: Secondary | ICD-10-CM | POA: Diagnosis not present

## 2015-11-20 DIAGNOSIS — G4710 Hypersomnia, unspecified: Secondary | ICD-10-CM | POA: Diagnosis not present

## 2015-11-20 DIAGNOSIS — G4733 Obstructive sleep apnea (adult) (pediatric): Secondary | ICD-10-CM | POA: Insufficient documentation

## 2015-11-20 DIAGNOSIS — R0683 Snoring: Secondary | ICD-10-CM | POA: Diagnosis not present

## 2015-11-20 DIAGNOSIS — I509 Heart failure, unspecified: Secondary | ICD-10-CM | POA: Insufficient documentation

## 2015-11-20 DIAGNOSIS — Z79899 Other long term (current) drug therapy: Secondary | ICD-10-CM | POA: Diagnosis not present

## 2015-11-20 DIAGNOSIS — E669 Obesity, unspecified: Secondary | ICD-10-CM | POA: Insufficient documentation

## 2015-11-20 DIAGNOSIS — R400 Somnolence: Secondary | ICD-10-CM

## 2015-11-21 ENCOUNTER — Other Ambulatory Visit (HOSPITAL_COMMUNITY): Payer: Self-pay | Admitting: Internal Medicine

## 2015-11-27 ENCOUNTER — Ambulatory Visit (INDEPENDENT_AMBULATORY_CARE_PROVIDER_SITE_OTHER): Payer: Medicare Other | Admitting: *Deleted

## 2015-11-27 DIAGNOSIS — I428 Other cardiomyopathies: Secondary | ICD-10-CM

## 2015-11-27 DIAGNOSIS — I429 Cardiomyopathy, unspecified: Secondary | ICD-10-CM

## 2015-11-28 NOTE — Progress Notes (Signed)
Remote ICD transmission.   

## 2015-12-02 ENCOUNTER — Telehealth: Payer: Self-pay | Admitting: Cardiology

## 2015-12-02 NOTE — Telephone Encounter (Signed)
Please let patient know that they have very minimal sleep apnea and recommend office visit to discuss treatment options.

## 2015-12-02 NOTE — Sleep Study (Signed)
   Patient Name: [name], [place] MRN: [id number] Study Date: 11/20/2015 Gender: Female D.O.B: July 12, 1951 Age (years): 64 Referring Provider: Virl Kirby Interpreting Physician: [name], ABSM RPSGT: Carolin Coy  Weight (lbs): 191 Height (inches): 64 BMI: 33 Neck Size: 13.50  CLINICAL INFORMATION Sleep Study Type: NPSG Indication for sleep study: Congestive Heart Failure, Excessive Daytime Sleepiness, Fatigue, Obesity Epworth Sleepiness Score: 4  SLEEP STUDY TECHNIQUE As per the AASM Manual for the Scoring of Sleep and Associated Events v2.3 (April 2016) with a hypopnea requiring 4% desaturations. The channels recorded and monitored were frontal, central and occipital EEG, electrooculogram (EOG), submentalis EMG (chin), nasal and oral airflow, thoracic and abdominal wall motion, anterior tibialis EMG, snore microphone, electrocardiogram, and pulse oximetry.  MEDICATIONS Patient's medications include: AMBIEN, Vitamin C, Biotin, Coreg, Digoxin, Lasix , KCl, Losartan, Zoloft, Aldactone. Medications self-administered by patient during sleep study : Sleep medicine administered - Ambien 5 mg at 10:45:00 PM  SLEEP ARCHITECTURE The study was initiated at 11:05:48 PM and ended at 5:15:14 AM. Sleep onset time was 7.4 minutes and the sleep efficiency was reduced at 78.9%. The total sleep time was 291.5 minutes. Stage REM latency was 92.5 minutes. The patient spent 15.78% of the night in stage N1 sleep, 73.59% in stage N2 sleep, 0.17% in stage N3 and 10.46% in REM. Alpha intrusion was absent. Supine sleep was 7.38%.  RESPIRATORY PARAMETERS The overall apnea/hypopnea index (AHI) was 6.2 per hour. There were 0 total apneas, including 0 obstructive, 0 central and 0 mixed apneas. There were 30 hypopneas and 45 RERAs. The AHI during Stage REM sleep was 5.9 per hour. AHI while supine was 41.9 per hour. The mean oxygen saturation was 91.73%. The minimum SpO2 during sleep was  85.00%. Moderate snoring was noted during this study.  CARDIAC DATA The 2 lead EKG demonstrated sinus rhythm. The mean heart rate was 73.68 beats per minute. Other EKG findings include: None.  LEG MOVEMENT DATA The total PLMS were 108 with a resulting PLMS index of 22.23. Associated arousal with leg movement index was 1.2 .  IMPRESSIONS - Mild obstructive sleep apnea occurred during this study (AHI = 6.2/h). - No significant central sleep apnea occurred during this study (CAI = 0.0/h). - Mild oxygen desaturation was noted during this study (Min O2 = 85.00%). - The patient snored with Moderate snoring volume. - Mild periodic limb movements of sleep occurred during the study. No significant associated arousals.  DIAGNOSIS - Obstructive Sleep Apnea (327.23 [G47.33 ICD-10])  RECOMMENDATIONS - Positional therapy avoiding supine position during sleep. - Very mild obstructive sleep apnea. Return to discuss possible treatment options which include CPAP therapy, ENT referral to evaluate for surgical causes of mild sleep apnea and fitting for oral device. - Avoid alcohol, sedatives and other CNS depressants that may worsen sleep apnea and disrupt normal sleep architecture. - Sleep hygiene should be reviewed to assess factors that may improve sleep quality. - Weight management and regular exercise should be initiated or continued if appropriate.   [place], American Board of Sleep Medicine  ELECTRONICALLY SIGNED ON:  12/02/2015, 9:51 PM [place] PH: [phone]   FX: [phone] [place]

## 2015-12-04 NOTE — Telephone Encounter (Signed)
Patient informed of results.  Appointment made with Dr. Radford Pax for 02/20/16 11:15am

## 2015-12-04 NOTE — Telephone Encounter (Signed)
Attempted to contact patient.   No answer on home phone, cell phone VM is full and I cannot leave a message.  Will attempt to reach again later

## 2015-12-07 LAB — CUP PACEART REMOTE DEVICE CHECK
Battery Remaining Percentage: 100 %
Brady Statistic RA Percent Paced: 0 %
Date Time Interrogation Session: 20170103051100
HighPow Impedance: 60 Ohm
Implantable Lead Implant Date: 20050701
Implantable Lead Implant Date: 20140314
Implantable Lead Location: 753858
Implantable Lead Location: 753860
Implantable Lead Model: 158
Implantable Lead Model: 5076
Lead Channel Impedance Value: 432 Ohm
Lead Channel Impedance Value: 501 Ohm
Lead Channel Pacing Threshold Amplitude: 1.3 V
Lead Channel Pacing Threshold Amplitude: 1.4 V
Lead Channel Pacing Threshold Pulse Width: 0.4 ms
Lead Channel Pacing Threshold Pulse Width: 0.6 ms
Lead Channel Setting Pacing Amplitude: 2 V
Lead Channel Setting Pacing Amplitude: 2 V
Lead Channel Setting Pacing Amplitude: 2.4 V
Lead Channel Setting Pacing Pulse Width: 0.4 ms
MDC IDC LEAD IMPLANT DT: 20050701
MDC IDC LEAD LOCATION: 753859
MDC IDC LEAD MODEL: 4196
MDC IDC LEAD SERIAL: 156416
MDC IDC MSMT BATTERY REMAINING LONGEVITY: 84 mo
MDC IDC MSMT LEADCHNL LV IMPEDANCE VALUE: 934 Ohm
MDC IDC MSMT LEADCHNL RA PACING THRESHOLD AMPLITUDE: 0.6 V
MDC IDC MSMT LEADCHNL RV PACING THRESHOLD PULSEWIDTH: 0.4 ms
MDC IDC SET LEADCHNL LV PACING PULSEWIDTH: 0.6 ms
MDC IDC SET LEADCHNL LV SENSING SENSITIVITY: 1 mV
MDC IDC SET LEADCHNL RV SENSING SENSITIVITY: 0.5 mV
MDC IDC STAT BRADY RV PERCENT PACED: 100 %
Pulse Gen Serial Number: 111235

## 2015-12-12 ENCOUNTER — Encounter: Payer: Self-pay | Admitting: Cardiology

## 2015-12-26 ENCOUNTER — Encounter: Payer: Self-pay | Admitting: Cardiology

## 2016-01-09 DIAGNOSIS — L820 Inflamed seborrheic keratosis: Secondary | ICD-10-CM | POA: Diagnosis not present

## 2016-01-09 DIAGNOSIS — L570 Actinic keratosis: Secondary | ICD-10-CM | POA: Diagnosis not present

## 2016-01-09 DIAGNOSIS — Z85828 Personal history of other malignant neoplasm of skin: Secondary | ICD-10-CM | POA: Diagnosis not present

## 2016-01-09 DIAGNOSIS — L821 Other seborrheic keratosis: Secondary | ICD-10-CM | POA: Diagnosis not present

## 2016-01-09 DIAGNOSIS — L720 Epidermal cyst: Secondary | ICD-10-CM | POA: Diagnosis not present

## 2016-01-09 DIAGNOSIS — L814 Other melanin hyperpigmentation: Secondary | ICD-10-CM | POA: Diagnosis not present

## 2016-01-09 DIAGNOSIS — D485 Neoplasm of uncertain behavior of skin: Secondary | ICD-10-CM | POA: Diagnosis not present

## 2016-01-16 DIAGNOSIS — L723 Sebaceous cyst: Secondary | ICD-10-CM | POA: Diagnosis not present

## 2016-01-16 DIAGNOSIS — L7211 Pilar cyst: Secondary | ICD-10-CM | POA: Diagnosis not present

## 2016-02-06 ENCOUNTER — Encounter: Payer: Self-pay | Admitting: Internal Medicine

## 2016-02-06 ENCOUNTER — Ambulatory Visit (INDEPENDENT_AMBULATORY_CARE_PROVIDER_SITE_OTHER): Payer: Medicare Other | Admitting: Internal Medicine

## 2016-02-06 VITALS — BP 126/74 | HR 83 | Temp 98.2°F | Ht 64.0 in | Wt 190.2 lb

## 2016-02-06 DIAGNOSIS — R1310 Dysphagia, unspecified: Secondary | ICD-10-CM | POA: Diagnosis not present

## 2016-02-06 DIAGNOSIS — Z09 Encounter for follow-up examination after completed treatment for conditions other than malignant neoplasm: Secondary | ICD-10-CM

## 2016-02-06 DIAGNOSIS — I10 Essential (primary) hypertension: Secondary | ICD-10-CM | POA: Diagnosis not present

## 2016-02-06 DIAGNOSIS — R059 Cough, unspecified: Secondary | ICD-10-CM

## 2016-02-06 DIAGNOSIS — I509 Heart failure, unspecified: Secondary | ICD-10-CM

## 2016-02-06 DIAGNOSIS — I5022 Chronic systolic (congestive) heart failure: Secondary | ICD-10-CM | POA: Diagnosis not present

## 2016-02-06 DIAGNOSIS — G4733 Obstructive sleep apnea (adult) (pediatric): Secondary | ICD-10-CM

## 2016-02-06 DIAGNOSIS — M25569 Pain in unspecified knee: Secondary | ICD-10-CM

## 2016-02-06 DIAGNOSIS — R05 Cough: Secondary | ICD-10-CM | POA: Diagnosis not present

## 2016-02-06 LAB — BASIC METABOLIC PANEL
BUN: 19 mg/dL (ref 6–23)
CALCIUM: 9.9 mg/dL (ref 8.4–10.5)
CO2: 28 mEq/L (ref 19–32)
CREATININE: 0.88 mg/dL (ref 0.40–1.20)
Chloride: 99 mEq/L (ref 96–112)
GFR: 68.19 mL/min (ref >60.00)
GLUCOSE: 100 mg/dL — AB (ref 70–99)
POTASSIUM: 4.6 meq/L (ref 3.5–5.1)
Sodium: 135 mEq/L (ref 135–145)

## 2016-02-06 LAB — CBC WITH DIFFERENTIAL/PLATELET
BASOS ABS: 0 10*3/uL (ref 0.0–0.1)
Basophils Relative: 0.4 % (ref 0.0–3.0)
EOS ABS: 0.2 10*3/uL (ref 0.0–0.7)
Eosinophils Relative: 3.3 % (ref 0.0–5.0)
HCT: 40.6 % (ref 36.0–46.0)
HEMOGLOBIN: 13.9 g/dL (ref 12.0–15.0)
Lymphocytes Relative: 31.5 % (ref 12.0–46.0)
Lymphs Abs: 2.4 10*3/uL (ref 0.7–4.0)
MCHC: 34.2 g/dL (ref 30.0–36.0)
MCV: 90 fl (ref 78.0–100.0)
Monocytes Absolute: 1 10*3/uL (ref 0.1–1.0)
Monocytes Relative: 13 % — ABNORMAL HIGH (ref 3.0–12.0)
Neutro Abs: 3.9 10*3/uL (ref 1.4–7.7)
Neutrophils Relative %: 51.8 % (ref 43.0–77.0)
Platelets: 294 10*3/uL (ref 150.0–400.0)
RBC: 4.51 Mil/uL (ref 3.87–5.11)
RDW: 12.9 % (ref 11.5–15.5)
WBC: 7.5 10*3/uL (ref 4.0–10.5)

## 2016-02-06 LAB — TSH: TSH: 0.36 u[IU]/mL (ref 0.35–4.50)

## 2016-02-06 MED ORDER — PANTOPRAZOLE SODIUM 40 MG PO TBEC: 40.0000 mg | DELAYED_RELEASE_TABLET | Freq: Every day | ORAL | Status: DC

## 2016-02-06 NOTE — Progress Notes (Signed)
Subjective:    Patient ID: [name], female    DOB: [birth year], 65 y.o.   MRN: [id number]  DOS:  02/06/2016 Type of visit - description : routine visit Interval history: CHF: saw cardiology 08/24/2015, felt to be stable, was recommended a sleep study ---> mild sleep apnea. Complaining of indigestion described as food getting stuck in the chest when she tries to swallow, this is going on for a while,Happening only w/ solids. Knee pain: Using  meloxicam as needed.  Review of Systems Denies weight loss, fever or chills No chest pain or difficulty breathing No nausea, vomiting, actual heartburn or abdominal pain. No odynophagia. Cough at baseline. Anxiety depression well-controlled  Past Medical History  Diagnosis Date  . Breast CA ([place])     twice first on L breast 1982 w/ chest wall involvment, then a second breast ca on the R in 1987  . Chronic systolic CHF (congestive heart failure) ([place])     a. due to Adriamycin,s/p ICD;  b. 01/2013 Gen change and new LV lead - BSX Energen CRT-D BiV ICD, Ser # [id number]  . Nonischemic cardiomyopathy ([place])   . Diverticulitis   . Osteopenia   . Renal calculus   . Menopause     since age 55s  . Depression     Past Surgical History  Procedure Laterality Date  . Mastectomy      B  . Oophorectomy      B, per genticists advise at [place]  . Cardiac defibrillator placement      AICD Replaced-5/09 and 2014  . Venogram N/A 10/20/2012    Procedure: VENOGRAM;  Surgeon: Deboraha Sprang, MD;  Location[facility] CATH LAB;  Service: Cardiovascular;  Laterality: N/A;  . Bi-ventricular implantable cardioverter defibrillator upgrade N/A 02/04/2013    Procedure: BI-VENTRICULAR IMPLANTABLE CARDIOVERTER DEFIBRILLATOR UPGRADE;  Surgeon: Evans Lance, MD;  Location: [facility] CATH LAB;  Service: Cardiovascular;  Laterality: N/A;  . Left and right heart catheterization with coronary angiogram N/A 10/06/2013    Procedure: LEFT AND RIGHT HEART CATHETERIZATION WITH  CORONARY ANGIOGRAM;  Surgeon: Jolaine Artist, MD;  Location: [facility] CATH LAB;  Service: Cardiovascular;  Laterality: N/A;    Social History   Social History  . Marital Status: Married    Spouse Name: N/A  . Number of Children: 3  . Years of Education: N/A   Occupational History  . RETIRED---pre school director      preschool   Social History Main Topics  . Smoking status: Never Smoker   . Smokeless tobacco: Never Used  . Alcohol Use: Yes     Comment: socially/occassionally  . Drug Use: No  . Sexual Activity: Not on file   Other Topics Concern  . Not on file   Social History Narrative   Related to Mr and Mrs Huston Foley, they are my patients as well    Married, 3 children all in [place], 7 Gkids              Medication List       This list is accurate as of: 02/06/16 11:59 PM.  Always use your most recent med list.               ascorbic acid 1000 MG tablet  Commonly known as:  VITAMIN C  Take 1,000 mg by mouth daily.     Biotin 5000 MCG Caps  Take 1 capsule by mouth daily.     carvedilol 12.5 MG tablet  Commonly  known as:  COREG  TAKE 1 TABLET BY MOUTH TWICE DAILY WITH MEALS     DIGOX 0.125 MG tablet  Generic drug:  digoxin  TAKE 1 TABLET BY MOUTH ONCE A DAY     furosemide 40 MG tablet  Commonly known as:  LASIX  TAKE 2 TABLETS BY MOUTH EVERY MORNING AND 1 TABLET EVERY EVENING     KLOR-CON M10 10 MEQ tablet  Generic drug:  potassium chloride  TAKE 3 TABLETS (30 MEQ TOTAL) BY MOUTH DAILY.     losartan 25 MG tablet  Commonly known as:  COZAAR  TAKE 1 TABLET (25 MG TOTAL) BY MOUTH 2 (TWO) TIMES DAILY.     meloxicam 15 MG tablet  Commonly known as:  MOBIC  Take 15 mg by mouth daily. For knee pain     multivitamin per tablet  Take 1 tablet by mouth daily.     pantoprazole 40 MG tablet  Commonly known as:  PROTONIX  Take 1 tablet (40 mg total) by mouth daily.     PROBIOTIC DAILY PO  Take 1 tablet by mouth daily.     sertraline 25 MG tablet  Commonly  known as:  ZOLOFT  Take 1 tablet (25 mg total) by mouth daily.     spironolactone 25 MG tablet  Commonly known as:  ALDACTONE  Take 1 tablet (25 mg total) by mouth daily.     vitamin B-12 1000 MCG tablet  Commonly known as:  CYANOCOBALAMIN  Take 1,000 mcg by mouth daily.     zolpidem 10 MG tablet  Commonly known as:  AMBIEN  Take 1 tablet (10 mg total) by mouth at bedtime as needed for sleep.           Objective:   Physical Exam BP 126/74 mmHg  Pulse 83  Temp(Src) 98.2 F (36.8 C) (Oral)  Ht 5\' 4"  (1.626 m)  Wt 190 lb 4 oz (86.297 kg)  BMI 32.64 kg/m2  SpO2 94% General:   Well developed, well nourished . NAD.  HEENT:  Normocephalic . Face symmetric, atraumatic; no LAD is at the neck or supraclavicular areas Lungs:  CTA B Normal respiratory effort, no intercostal retractions, no accessory muscle use. Heart: RRR,  + syst murmur.  no pretibial edema bilaterally  Abdomen:  Not distended, soft, non-tender. No rebound or rigidity.  Skin: Not pale. Not jaundice Neurologic:  alert & oriented X3.  Speech normal, gait appropriate for age and unassisted Psych--  Cognition and judgment appear intact.  Cooperative with normal attention span and concentration.  Behavior appropriate. No anxious or depressed appearing.    Assessment & Plan:   Assessment CHF, nonischemic cardiomyopathy:due to chemotherapy Depression Mild OSA per sleep study 11-2015, to see cards 02-20-16 Osteopenia Kidney stones Menopause Breast cancer x 2: first in 1982 with chest wall involvement, 2nd on the R dx  1987, s/p B mastectomy  PLAN: CHF: Seems to be well-controlled, check a BMP, TSH Mild sleep apnea -- per  recent sleep study, to see Dr. Radford Pax soon Chronic cough --- at baseline Dysphagia: new issue, denies weight loss or actual heartburn. recommend PPIs, if not better will need a referral to GI in few weeks.check CBC Knee pain: On meloxicam, recommend to minimize its use due to potentia GI  -renal problems . Try to use Tylenol if possible. RTC 4 months, CPX

## 2016-02-06 NOTE — Progress Notes (Signed)
Pre visit review using our clinic review tool, if applicable. No additional management support is needed unless otherwise documented below in the visit note. 

## 2016-02-06 NOTE — Patient Instructions (Signed)
GO TO THE LAB :      Get the blood work     GO TO THE FRONT DESK Schedule your next appointment for a  physical When?   4 months Fasting?  Yes    Take pantoprazole every morning before breakfast. If the difficulty swallowing is not improving please let me know for referral to the [facility]  doctor

## 2016-02-07 ENCOUNTER — Encounter: Payer: Self-pay | Admitting: Internal Medicine

## 2016-02-07 DIAGNOSIS — R1310 Dysphagia, unspecified: Secondary | ICD-10-CM | POA: Insufficient documentation

## 2016-02-07 DIAGNOSIS — G4733 Obstructive sleep apnea (adult) (pediatric): Secondary | ICD-10-CM

## 2016-02-07 DIAGNOSIS — Z09 Encounter for follow-up examination after completed treatment for conditions other than malignant neoplasm: Secondary | ICD-10-CM | POA: Insufficient documentation

## 2016-02-07 HISTORY — DX: Obstructive sleep apnea (adult) (pediatric): G47.33

## 2016-02-07 NOTE — Assessment & Plan Note (Signed)
CHF: Seems to be well-controlled, check a BMP, TSH Mild sleep apnea -- per  recent sleep study, to see Dr. Radford Pax soon Chronic cough --- at baseline Dysphagia: new issue, denies weight loss or actual heartburn. recommend PPIs, if not better will need a referral to GI in few weeks.check CBC Knee pain: On meloxicam, recommend to minimize its use due to potentia GI -renal problems . Try to use Tylenol if possible. RTC 4 months, CPX

## 2016-02-19 NOTE — Progress Notes (Signed)
Cardiology Office Note   Date:  02/20/2016   ID:  [name] [birth year], MRN [id number]  PCP:  Alejandra November, MD    Chief Complaint  Patient presents with  . Fatigue      History of Present Illness: [name] is a 65 y.o. female who presents for evaluation of OSA.  She was referred by Dr. Caryl Kirby for excessive daytime sleepiness despite a low Epworth sleepiness score of 4.   She thinks she has some mild snoring.  She says that she feels more fatigued than sleepy during the day. She denies any problems with awakening gasping for breath.   She had evidence of reduced sleep efficiency but only very mild OSA by the 3% oxygen rule with an AHI of 6.2/hr and no OSA using CMS 4% rule with AHI 4.9/hr. She has oxygen saturations as low as 85% and < 88% for only 1 minutes during the study and moderate snoring.  She is now here to discuss treatment options.     Past Medical History  Diagnosis Date  . Breast CA ([place])     twice first on L breast 1982 w/ chest wall involvment, then a second breast ca on the R in 1987  . Chronic systolic CHF (congestive heart failure) ([place])     a. due to Adriamycin,s/p ICD;  b. 01/2013 Gen change and new LV lead - BSX Energen CRT-D BiV ICD, Ser # [id number]  . Nonischemic cardiomyopathy ([place])   . Diverticulitis   . Osteopenia   . Renal calculus   . Menopause     since age 74s  . Depression   . OSA (obstructive sleep apnea) 02/07/2016  . Fatigue 02/20/2016  . Snoring 02/20/2016    Past Surgical History  Procedure Laterality Date  . Mastectomy      B  . Oophorectomy      B, per genticists advise at [place]  . Cardiac defibrillator placement      AICD Replaced-5/09 and 2014  . Venogram N/A 10/20/2012    Procedure: VENOGRAM;  Surgeon: Alejandra Sprang, MD;  Location: [facility] CATH LAB;  Service: Cardiovascular;  Laterality: N/A;  . Bi-ventricular implantable cardioverter defibrillator upgrade N/A 02/04/2013    Procedure:  BI-VENTRICULAR IMPLANTABLE CARDIOVERTER DEFIBRILLATOR UPGRADE;  Surgeon: Alejandra Lance, MD;  Location: [facility] CATH LAB;  Service: Cardiovascular;  Laterality: N/A;  . Left and right heart catheterization with coronary angiogram N/A 10/06/2013    Procedure: LEFT AND RIGHT HEART CATHETERIZATION WITH CORONARY ANGIOGRAM;  Surgeon: Alejandra Artist, MD;  Location: [facility] CATH LAB;  Service: Cardiovascular;  Laterality: N/A;     Current Outpatient Prescriptions  Medication Sig Dispense Refill  . ascorbic acid (VITAMIN C) 1000 MG tablet Take 1,000 mg by mouth daily.    . Biotin 5000 MCG CAPS Take 1 capsule by mouth daily.    . carvedilol (COREG) 12.5 MG tablet TAKE 1 TABLET BY MOUTH TWICE DAILY WITH MEALS 60 tablet 6  . DIGOX 125 MCG tablet TAKE 1 TABLET BY MOUTH ONCE A DAY 30 tablet 6  . furosemide (LASIX) 40 MG tablet TAKE 2 TABLETS BY MOUTH EVERY MORNING AND 1 TABLET EVERY EVENING 90 tablet 5  . KLOR-CON M10 10 MEQ tablet TAKE 3 TABLETS (30 MEQ TOTAL) BY MOUTH DAILY. 90 tablet 6  . losartan (COZAAR) 25 MG tablet TAKE 1 TABLET (25 MG TOTAL) BY MOUTH  2 (TWO) TIMES DAILY. 180 tablet 3  . multivitamin (THERAGRAN) per tablet Take 1 tablet by mouth daily.     . pantoprazole (PROTONIX) 40 MG tablet Take 1 tablet (40 mg total) by mouth daily. 30 tablet 6  . Probiotic Product (PROBIOTIC DAILY PO) Take 1 tablet by mouth daily.    . sertraline (ZOLOFT) 25 MG tablet Take 1 tablet (25 mg total) by mouth daily. 60 tablet 6  . spironolactone (ALDACTONE) 25 MG tablet Take 1 tablet (25 mg total) by mouth daily. 90 tablet 3  . vitamin B-12 (CYANOCOBALAMIN) 1000 MCG tablet Take 1,000 mcg by mouth daily.    Marland Kitchen zolpidem (AMBIEN) 10 MG tablet Take 1 tablet (10 mg total) by mouth at bedtime as needed for sleep. 90 tablet 0   No current facility-administered medications for this visit.    Allergies:   Atacand; Cephalexin; and Latex    Social History:  The patient  reports that she has never smoked. She has never used  smokeless tobacco. She reports that she drinks alcohol. She reports that she does not use illicit drugs.   Family History:  The patient's family history includes Breast cancer in her mother; Kidney cancer in her brother. There is no history of Heart attack, Diabetes, or Colon cancer.    ROS:  Please see the history of present illness.   Otherwise, review of systems are positive for none.   All other systems are reviewed and negative.    PHYSICAL EXAM: VS:  BP 100/68 mmHg  Pulse 68  Ht 5\' 4"  (1.626 m)  Wt 191 lb (86.637 kg)  BMI 32.77 kg/m2 , BMI Body mass index is 32.77 kg/(m^2). GEN: Well nourished, well developed, in no acute distress HEENT: normal Neck: no JVD, carotid bruits, or masses Cardiac: RRR; no murmurs, rubs, or gallops,no edema  Respiratory:  clear to auscultation bilaterally, normal work of breathing GI: soft, nontender, nondistended, + BS MS: no deformity or atrophy Skin: warm and dry, no rash Neuro:  Strength and sensation are intact Psych: euthymic mood, full affect   EKG:  EKG is not ordered today.    Recent Labs: 03/16/2015: ALT 17 02/06/2016: BUN 19; Creatinine, Ser 0.88; Hemoglobin 13.9; Platelets 294.0; Potassium 4.6; Sodium 135; TSH 0.36    Lipid Panel    Component Value Date/Time   CHOL 174 03/16/2015   TRIG 76 03/16/2015   HDL 59 03/16/2015   CHOLHDL 4 02/27/2014 1650   VLDL 30.6 02/27/2014 1650   LDLCALC 100 03/16/2015      Wt Readings from Last 3 Encounters:  02/20/16 191 lb (86.637 kg)  02/06/16 190 lb 4 oz (86.297 kg)  11/20/15 191 lb (86.637 kg)        ASSESSMENT AND PLAN:  1.  Very mild OSA with oxygen saturations as low as 85% < 1% of the time.  By CMS 4% oxygen desat rule she only has an AHI of 4.9% and therefore dose not have sleep apnea.  She only qualifies using the 3% rule.  Her symptoms are more related to generalized fatigue and no excessive sleepiness.  At this time I do not think that she would benefit from from PAP  therapy or oral device and Medicare would not cover this.   2.  NIDCM secondary to chemotherapy for breast CA.  Continue current medical therapy 3.  Snoring - she says that this has never bothered her or her husband.  I have asked her to let me know if it  becomes a problems and I can refer her to ENT. 4.  Obesity - Encouraged her to walk for exercise.   Current medicines are reviewed at length with the patient today.  The patient does not have concerns regarding medicines.  The following changes have been made:  no change  Labs/ tests ordered today: See above Assessment and Plan No orders of the defined types were placed in this encounter.     Disposition:   FU with me PRN  Signed, Sueanne Margarita, MD  02/20/2016 11:37 AM    [facility] [address], [place], [place]  [address] Phone: [phone]; Fax: [phone]

## 2016-02-20 ENCOUNTER — Encounter: Payer: Self-pay | Admitting: Cardiology

## 2016-02-20 ENCOUNTER — Ambulatory Visit (INDEPENDENT_AMBULATORY_CARE_PROVIDER_SITE_OTHER): Payer: Medicare Other | Admitting: Cardiology

## 2016-02-20 VITALS — BP 100/68 | HR 68 | Ht 64.0 in | Wt 191.0 lb

## 2016-02-20 DIAGNOSIS — I5022 Chronic systolic (congestive) heart failure: Secondary | ICD-10-CM | POA: Diagnosis not present

## 2016-02-20 DIAGNOSIS — E669 Obesity, unspecified: Secondary | ICD-10-CM | POA: Diagnosis not present

## 2016-02-20 DIAGNOSIS — R0683 Snoring: Secondary | ICD-10-CM | POA: Insufficient documentation

## 2016-02-20 DIAGNOSIS — R5383 Other fatigue: Secondary | ICD-10-CM | POA: Diagnosis not present

## 2016-02-20 HISTORY — DX: Other fatigue: R53.83

## 2016-02-20 HISTORY — DX: Snoring: R06.83

## 2016-02-20 NOTE — Patient Instructions (Signed)
Medication Instructions:  Your physician recommends that you continue on your current medications as directed. Please refer to the Current Medication list given to you today.   Labwork: None  Testing/Procedures: None  Follow-Up: Your physician recommends that you schedule a follow-up appointment AS NEEDED with Dr. Turner.  Any Other Special Instructions Will Be Listed Below (If Applicable).     If you need a refill on your cardiac medications before your next appointment, please call your pharmacy.   

## 2016-02-25 ENCOUNTER — Other Ambulatory Visit (HOSPITAL_COMMUNITY): Payer: Self-pay | Admitting: Internal Medicine

## 2016-02-26 ENCOUNTER — Ambulatory Visit (INDEPENDENT_AMBULATORY_CARE_PROVIDER_SITE_OTHER): Payer: Medicare Other | Admitting: *Deleted

## 2016-02-26 DIAGNOSIS — Z95810 Presence of automatic (implantable) cardiac defibrillator: Secondary | ICD-10-CM

## 2016-02-26 DIAGNOSIS — I472 Ventricular tachycardia: Secondary | ICD-10-CM | POA: Diagnosis not present

## 2016-02-26 DIAGNOSIS — I4729 Other ventricular tachycardia: Secondary | ICD-10-CM

## 2016-02-26 NOTE — Progress Notes (Signed)
Remote ICD transmission.   

## 2016-02-28 DIAGNOSIS — M76891 Other specified enthesopathies of right lower limb, excluding foot: Secondary | ICD-10-CM | POA: Diagnosis not present

## 2016-02-28 DIAGNOSIS — M1711 Unilateral primary osteoarthritis, right knee: Secondary | ICD-10-CM | POA: Diagnosis not present

## 2016-02-28 DIAGNOSIS — M7050 Other bursitis of knee, unspecified knee: Secondary | ICD-10-CM | POA: Diagnosis not present

## 2016-02-28 DIAGNOSIS — M6281 Muscle weakness (generalized): Secondary | ICD-10-CM | POA: Diagnosis not present

## 2016-03-11 ENCOUNTER — Other Ambulatory Visit: Payer: Self-pay | Admitting: Cardiology

## 2016-03-11 ENCOUNTER — Telehealth: Payer: Self-pay

## 2016-03-11 NOTE — Telephone Encounter (Signed)
Pt is requesting refill on Ambien.  Last OV: 02/06/2016  Last Fill: 10/03/2015 #90 and 0RF UDS: Not needed  Please advise.

## 2016-03-11 NOTE — Telephone Encounter (Signed)
Okay #90 and 1 refill 

## 2016-03-12 MED ORDER — ZOLPIDEM TARTRATE 10 MG PO TABS: 10.0000 mg | ORAL_TABLET | Freq: Every evening | ORAL | Status: DC | PRN

## 2016-03-12 NOTE — Telephone Encounter (Signed)
Rx faxed to [facility] pharmacy.  

## 2016-03-12 NOTE — Telephone Encounter (Signed)
Rx printed, awaiting MD signature.  

## 2016-03-21 DIAGNOSIS — M1711 Unilateral primary osteoarthritis, right knee: Secondary | ICD-10-CM | POA: Diagnosis not present

## 2016-03-24 DIAGNOSIS — M1711 Unilateral primary osteoarthritis, right knee: Secondary | ICD-10-CM | POA: Diagnosis not present

## 2016-03-26 DIAGNOSIS — M1711 Unilateral primary osteoarthritis, right knee: Secondary | ICD-10-CM | POA: Diagnosis not present

## 2016-03-28 DIAGNOSIS — M1711 Unilateral primary osteoarthritis, right knee: Secondary | ICD-10-CM | POA: Diagnosis not present

## 2016-04-02 DIAGNOSIS — M1711 Unilateral primary osteoarthritis, right knee: Secondary | ICD-10-CM | POA: Diagnosis not present

## 2016-04-04 DIAGNOSIS — M1711 Unilateral primary osteoarthritis, right knee: Secondary | ICD-10-CM | POA: Diagnosis not present

## 2016-04-17 ENCOUNTER — Encounter: Payer: Self-pay | Admitting: Cardiology

## 2016-04-17 LAB — CUP PACEART REMOTE DEVICE CHECK
Battery Remaining Longevity: 78 mo
Battery Remaining Percentage: 100 %
Brady Statistic RV Percent Paced: 100 %
Date Time Interrogation Session: 20170404041100
HIGH POWER IMPEDANCE MEASURED VALUE: 58 Ohm
Implantable Lead Implant Date: 20050701
Implantable Lead Implant Date: 20140314
Implantable Lead Location: 753858
Implantable Lead Location: 753859
Implantable Lead Location: 753860
Implantable Lead Model: 158
Implantable Lead Model: 4196
Implantable Lead Model: 5076
Implantable Lead Serial Number: 156416
Lead Channel Impedance Value: 421 Ohm
Lead Channel Impedance Value: 486 Ohm
Lead Channel Impedance Value: 860 Ohm
Lead Channel Pacing Threshold Amplitude: 0.6 V
Lead Channel Pacing Threshold Amplitude: 1.3 V
Lead Channel Pacing Threshold Pulse Width: 0.4 ms
Lead Channel Pacing Threshold Pulse Width: 0.4 ms
Lead Channel Pacing Threshold Pulse Width: 0.6 ms
Lead Channel Setting Pacing Amplitude: 2 V
Lead Channel Setting Pacing Amplitude: 2.4 V
Lead Channel Setting Pacing Pulse Width: 0.4 ms
Lead Channel Setting Pacing Pulse Width: 0.6 ms
Lead Channel Setting Sensing Sensitivity: 0.5 mV
Lead Channel Setting Sensing Sensitivity: 1 mV
MDC IDC LEAD IMPLANT DT: 20050701
MDC IDC MSMT LEADCHNL RV PACING THRESHOLD AMPLITUDE: 1.4 V
MDC IDC SET LEADCHNL LV PACING AMPLITUDE: 2 V
MDC IDC STAT BRADY RA PERCENT PACED: 0 %
Pulse Gen Serial Number: 111235

## 2016-04-25 DIAGNOSIS — M76891 Other specified enthesopathies of right lower limb, excluding foot: Secondary | ICD-10-CM | POA: Diagnosis not present

## 2016-04-25 DIAGNOSIS — M7051 Other bursitis of knee, right knee: Secondary | ICD-10-CM | POA: Diagnosis not present

## 2016-04-25 DIAGNOSIS — M1711 Unilateral primary osteoarthritis, right knee: Secondary | ICD-10-CM | POA: Diagnosis not present

## 2016-04-25 DIAGNOSIS — M6281 Muscle weakness (generalized): Secondary | ICD-10-CM | POA: Diagnosis not present

## 2016-05-05 ENCOUNTER — Encounter: Payer: Self-pay | Admitting: Cardiology

## 2016-05-07 ENCOUNTER — Encounter (HOSPITAL_COMMUNITY): Payer: Self-pay | Admitting: Internal Medicine

## 2016-05-07 ENCOUNTER — Ambulatory Visit (HOSPITAL_COMMUNITY)
Admission: RE | Admit: 2016-05-07 | Discharge: 2016-05-07 | Disposition: A | Discharge status: Home / Self Care | Payer: Medicare Other | Source: Ambulatory Visit | Attending: Internal Medicine | Admitting: Internal Medicine

## 2016-05-07 VITALS — BP 108/60 | HR 82 | Wt 189.8 lb

## 2016-05-07 DIAGNOSIS — I5022 Chronic systolic (congestive) heart failure: Secondary | ICD-10-CM

## 2016-05-07 DIAGNOSIS — G4733 Obstructive sleep apnea (adult) (pediatric): Secondary | ICD-10-CM | POA: Insufficient documentation

## 2016-05-07 DIAGNOSIS — Z881 Allergy status to other antibiotic agents status: Secondary | ICD-10-CM | POA: Diagnosis not present

## 2016-05-07 DIAGNOSIS — Z9221 Personal history of antineoplastic chemotherapy: Secondary | ICD-10-CM | POA: Insufficient documentation

## 2016-05-07 DIAGNOSIS — Z91040 Latex allergy status: Secondary | ICD-10-CM | POA: Insufficient documentation

## 2016-05-07 DIAGNOSIS — Z87442 Personal history of urinary calculi: Secondary | ICD-10-CM | POA: Diagnosis not present

## 2016-05-07 DIAGNOSIS — I359 Nonrheumatic aortic valve disorder, unspecified: Secondary | ICD-10-CM

## 2016-05-07 DIAGNOSIS — F329 Major depressive disorder, single episode, unspecified: Secondary | ICD-10-CM | POA: Insufficient documentation

## 2016-05-07 DIAGNOSIS — Z923 Personal history of irradiation: Secondary | ICD-10-CM | POA: Diagnosis not present

## 2016-05-07 DIAGNOSIS — Z79899 Other long term (current) drug therapy: Secondary | ICD-10-CM | POA: Diagnosis not present

## 2016-05-07 DIAGNOSIS — I428 Other cardiomyopathies: Secondary | ICD-10-CM | POA: Insufficient documentation

## 2016-05-07 DIAGNOSIS — Z95810 Presence of automatic (implantable) cardiac defibrillator: Secondary | ICD-10-CM | POA: Diagnosis not present

## 2016-05-07 DIAGNOSIS — Z8249 Family history of ischemic heart disease and other diseases of the circulatory system: Secondary | ICD-10-CM | POA: Insufficient documentation

## 2016-05-07 DIAGNOSIS — Z853 Personal history of malignant neoplasm of breast: Secondary | ICD-10-CM | POA: Insufficient documentation

## 2016-05-07 NOTE — Patient Instructions (Addendum)
STOP Losartan.  START Entresto 24/26 mg tablet twice daily on FRIDAY  Follow up 3 months with echocardiogram.  Do the following things EVERYDAY: 1) Weigh yourself in the morning before breakfast. Write it down and keep it in a log. 2) Take your medicines as prescribed 3) Eat low salt foods-Limit salt (sodium) to 2000 mg per day.  4) Stay as active as you can everyday 5) Limit all fluids for the day to less than 2 liters

## 2016-05-07 NOTE — Progress Notes (Signed)
ADVANCED HEART FAILURE CLINIC NOTE  Patient ID: [name], female   DOB: 06-01-1951, 65 y.o.   MRN: [id number] PCP: Dr Larose Kells EP: Dr Caryl Comes  Subjective [name] is a 65 y/o woman with h/o chronic systolic presumed due to chemotherapy-related cardiomyopathy diagnosed with breast cancer in 1982 in [place]. Treated with chemo (including adriamycin)/XRT and L mastectomy. Had R breast cancer (unrelated) in 1987. Has been cancer free since. Denies HTN, HL, DM2.  Was first diagnosed with HF in 1988. Had left heart cath she thinks in 2009. 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 Echo 2009 EF 20% with moderate AI and mild to moderate MR. Had [place] ICD placed and then LV lead became nonfunctional. In 3/14, underwent CRT revision.   Previously on coumadin due to cardiomyopathy but discontinued.   She is here for routine f/u. Overall fairly stable. Able to do ADLs without problem. Gets SOB going to the mailbox or with a flight of steps. Can do the whole grocery store slowly. No edema, orthopnea or PND. Weighing every day. Weight stable. No problems with meds. At last visit started [facility] but couldn't afford so remains on losartan. Had sleep study 1/17 very mild OSA (6.2/hr) not using CPAP.   ECHO 09/2013 with Dr. Wynonia Lawman and EF 10-15% with significant dyssynchrony. Subsequently had LV lead checked and was functioning well.  ECHO 7/15 EF 10-15%, moderate to severe RV dysfunction ECHO 4/16 EF 15% normal RV moderate AI  LHC/RHC 10/06/13  RA = 11  RV = 54/9/13  PA = 53/27 (38)  PCW = 23  Fick cardiac output/index = 3.8/2.0  Thermo CO/CI = 3.4/1.8  PVR = 3.9 WU (Fick)  FA sat = 98%  PA sat = 62%, 64%  SVC = 59%  Coronaries no significant disease but there was a fistula from small D1 => PA.   CPX 10/25/13  Peak VO2: 15.6 (81.2% predicted peak VO2) VE/VCO2 slope: 36.9 OUES: 1.25 Peak RER: 1.08  CPX 05/24/14 FVC 3.05 (92%)  FEV1 2.23 (88%)  FEV1/FVC 73%    MVV 92 (100%) Resting HR: 64 Peak HR: 132 (85% age predicted max HR) BP rest: 108/70 BP peak: 130/66 Peak VO2: 16.4 (81.1% predicted peak VO2) VE/VCO2 slope: 33.6 OUES: 1.74 Peak RER: 1.16 Ventilatory Threshold: 12.5 (61.8% predicted peak VO2) VE/MVV: 62% PETCO2 at peak: 31 O2pulse: 12 (109% predicted O2pulse)    Lab 10/06/13 K 2.8 Creatinine 0.83 Labs 10/31/13 K 3.0 Creatinine 0.68  Dig level 0.9 Pro BNP 3906  Labs 11/11/13  K 3.6 Creatinine 0.8 Labs 12/16/13 K 3.8 Creatinine 0.8  Labs 4/15 K 4, creatinine 0.8, LDL 128, TSH normal, digoxin 0.6 Labs 6/15 K 4.1 Cr 0.8 digoxin 0.7 Labs 8/15 K 4.2, creatinine 0.78, pBNP 2126 Labs 8/15 K 4.2, creatinine 1.0 dig <0.5  ECG: NSR, BiV paced  Blood type A+   FHX: Had older brother with HF in his 26s. No strong FHx of HF.   SocHx: Retired. Non-smoker. Rare ETOH. 3 grown kids.    Past Medical History  Diagnosis Date  . Breast CA ([place])     twice first on L breast 1982 w/ chest wall involvment, then a second breast ca on the R in 1987  . Chronic systolic CHF (congestive heart failure) ([place])     a. due to Adriamycin,s/p ICD;  b. 01/2013 Gen change and new LV lead - BSX Energen CRT-D BiV ICD, Ser # [id number]  . Nonischemic cardiomyopathy ([place])   .  Diverticulitis   . Osteopenia   . Renal calculus   . Menopause     since age 27s  . Depression   . OSA (obstructive sleep apnea) 02/07/2016  . Fatigue 02/20/2016  . Snoring 02/20/2016    Past Surgical History  Procedure Laterality Date  . Mastectomy      B  . Oophorectomy      B, per genticists advise at [place]  . Cardiac defibrillator placement      AICD Replaced-5/09 and 2014  . Venogram N/A 10/20/2012    Procedure: VENOGRAM;  Surgeon: Deboraha Sprang, MD;  Location: [facility] CATH LAB;  Service: Cardiovascular;  Laterality: N/A;  . Bi-ventricular implantable cardioverter defibrillator upgrade N/A 02/04/2013    Procedure: BI-VENTRICULAR IMPLANTABLE CARDIOVERTER DEFIBRILLATOR UPGRADE;   Surgeon: Evans Lance, MD;  Location: [facility] CATH LAB;  Service: Cardiovascular;  Laterality: N/A;  . Left and right heart catheterization with coronary angiogram N/A 10/06/2013    Procedure: LEFT AND RIGHT HEART CATHETERIZATION WITH CORONARY ANGIOGRAM;  Surgeon: Jolaine Artist, MD;  Location: [facility] CATH LAB;  Service: Cardiovascular;  Laterality: N/A;    Current Outpatient Prescriptions  Medication Sig Dispense Refill  . ascorbic acid (VITAMIN C) 1000 MG tablet Take 1,000 mg by mouth daily.    . Biotin 5000 MCG CAPS Take 1 capsule by mouth daily.    . carvedilol (COREG) 12.5 MG tablet TAKE 1 TABLET BY MOUTH TWICE DAILY WITH MEALS 60 tablet 6  . DIGOX 125 MCG tablet TAKE 1 TABLET BY MOUTH ONCE A DAY 30 tablet 6  . furosemide (LASIX) 40 MG tablet TAKE 2 TABLETS BY MOUTH EVERY MORNING AND 1 TABLET EVERY EVENING 90 tablet 5  . KLOR-CON M10 10 MEQ tablet TAKE 3 TABLETS (30 MEQ TOTAL) BY MOUTH DAILY. 90 tablet 6  . losartan (COZAAR) 25 MG tablet TAKE 1 TABLET (25 MG TOTAL) BY MOUTH 2 (TWO) TIMES DAILY. 180 tablet 3  . multivitamin (THERAGRAN) per tablet Take 1 tablet by mouth daily.     . pantoprazole (PROTONIX) 40 MG tablet Take 1 tablet (40 mg total) by mouth daily. 30 tablet 6  . Probiotic Product (PROBIOTIC DAILY PO) Take 1 tablet by mouth daily.    . sertraline (ZOLOFT) 25 MG tablet Take 1 tablet (25 mg total) by mouth daily. 60 tablet 6  . spironolactone (ALDACTONE) 25 MG tablet TAKE 1 TABLET BY MOUTH DAILY. 90 tablet 3  . vitamin B-12 (CYANOCOBALAMIN) 1000 MCG tablet Take 1,000 mcg by mouth daily.    Marland Kitchen zolpidem (AMBIEN) 10 MG tablet Take 1 tablet (10 mg total) by mouth at bedtime as needed for sleep. 90 tablet 1   No current facility-administered medications for this encounter.    Allergies  Allergen Reactions  . Atacand [Candesartan]     Fatigue   . Cephalexin     REACTION: itching, hives  . Latex Rash    Review of Systems negative except from HPI and PMH  Physical Exam BP  108/60 mmHg  Pulse 82  Wt 189 lb 12 oz (86.07 kg)  SpO2 98% General: Well developed and well nourished. NAD  HEENT:  normal Neck: Supple JVP 5-6, no carotid bruit Lungs: Clear to ausculation Cor: RRR . Soft AI murmur Ad: obese Soft. NT ND. No hepatosplenomegaly good bowel sounds Extremities: warm No clubbing cyanosis, edema Alert and oriented, grossly normal motor and sensory function Skin:  no rash Neuro: A and O x 3 affect; normal.   Assessment and  Plan  1) Chronic systolic HF: Nonischemic cardiomyopathy, possibly anthracycline cardiotoxicity.  LHC 11/14 without significant coronary disease.    Has [employer] CRT-D. ECHO 09/2013 EF 10-15%. Echo at [facility] 4/16 EF 15% moderate AI.  Seen in [facility] and will follow prn - NYHA III, stable. Volume status ok.  - Will once again try to stop losartan and start Entresto 24/26 bid. We will fill out paperwork for assistance program. She will call if she gets dizzy with this change.  - Continue Coreg, digoxin, spironolactone at current doses.  - See back in 3 months with echo - We discussed repeat CPX testing but overall symptoms very stable. Will repeat if symptoms get any worse  2) h/o bilateral breast CA 3) ICD - f/u with Dr. Rhunette Croft MD 05/07/2016

## 2016-05-07 NOTE — Addendum Note (Signed)
Encounter addended by: Effie Berkshire, RN on: 05/07/2016  3:31 PM<BR>     Documentation filed: Dx Association, Patient Instructions Section, Medications, Orders

## 2016-05-08 ENCOUNTER — Telehealth (HOSPITAL_COMMUNITY): Payer: Self-pay | Admitting: Pharmacist

## 2016-05-08 MED ORDER — SACUBITRIL-VALSARTAN 24-26 MG PO TABS: 1.0000 | ORAL_TABLET | Freq: Two times a day (BID) | ORAL | Status: DC

## 2016-05-08 NOTE — Telephone Encounter (Signed)
[name] PA approved by [facility] Part D through 11/23/2016.   Ruta Hinds. Velva Harman, PharmD, BCPS, CPP Clinical Pharmacist Pager: [phone] Phone: [phone] 05/08/2016 4:20 PM

## 2016-05-08 NOTE — Telephone Encounter (Signed)
Patient started on Entresto but copay is expensive for patient so have enrolled her in PAN foundation so that she will have $800 toward her copay costs through 05/07/17. Will relay info to [employer].   Billing ID: [id number] Person Code: [facility]: [id number] RX BIN: [id number] PCN for Part D: [id number]    Ruta Hinds. Velva Harman, PharmD, BCPS, CPP Clinical Pharmacist Pager: [phone] Phone: [phone] 05/08/2016 12:29 PM

## 2016-05-28 ENCOUNTER — Ambulatory Visit (INDEPENDENT_AMBULATORY_CARE_PROVIDER_SITE_OTHER): Payer: Medicare Other | Admitting: *Deleted

## 2016-05-28 DIAGNOSIS — Z95810 Presence of automatic (implantable) cardiac defibrillator: Secondary | ICD-10-CM | POA: Diagnosis not present

## 2016-05-28 DIAGNOSIS — I472 Ventricular tachycardia: Secondary | ICD-10-CM

## 2016-05-28 DIAGNOSIS — I4729 Other ventricular tachycardia: Secondary | ICD-10-CM

## 2016-05-28 LAB — CUP PACEART REMOTE DEVICE CHECK
Battery Remaining Percentage: 100 %
Brady Statistic RV Percent Paced: 100 %
HIGH POWER IMPEDANCE MEASURED VALUE: 59 Ohm
Implantable Lead Implant Date: 20050701
Implantable Lead Location: 753858
Implantable Lead Location: 753859
Implantable Lead Serial Number: 156416
Lead Channel Impedance Value: 489 Ohm
Lead Channel Impedance Value: 944 Ohm
Lead Channel Setting Pacing Amplitude: 2.4 V
Lead Channel Setting Pacing Pulse Width: 0.4 ms
Lead Channel Setting Pacing Pulse Width: 0.6 ms
Lead Channel Setting Sensing Sensitivity: 0.5 mV
MDC IDC LEAD IMPLANT DT: 20050701
MDC IDC LEAD IMPLANT DT: 20140314
MDC IDC LEAD LOCATION: 753860
MDC IDC LEAD MODEL: 158
MDC IDC LEAD MODEL: 4196
MDC IDC MSMT BATTERY REMAINING LONGEVITY: 78 mo
MDC IDC MSMT LEADCHNL RV IMPEDANCE VALUE: 406 Ohm
MDC IDC SESS DTM: 20170705041200
MDC IDC SET LEADCHNL LV PACING AMPLITUDE: 2 V
MDC IDC SET LEADCHNL LV SENSING SENSITIVITY: 1 mV
MDC IDC SET LEADCHNL RA PACING AMPLITUDE: 2 V
MDC IDC STAT BRADY RA PERCENT PACED: 0 %
Pulse Gen Serial Number: 111235

## 2016-05-28 NOTE — Progress Notes (Signed)
Remote ICD transmission.   

## 2016-06-02 ENCOUNTER — Other Ambulatory Visit (HOSPITAL_COMMUNITY): Payer: Self-pay | Admitting: Internal Medicine

## 2016-06-04 ENCOUNTER — Encounter: Payer: Self-pay | Admitting: Cardiology

## 2016-06-09 ENCOUNTER — Other Ambulatory Visit (HOSPITAL_COMMUNITY): Payer: Self-pay | Admitting: *Deleted

## 2016-06-09 MED ORDER — DIGOXIN 125 MCG PO TABS: 125.0000 ug | ORAL_TABLET | Freq: Every day | ORAL | Status: DC

## 2016-06-09 MED ORDER — POTASSIUM CHLORIDE CRYS ER 10 MEQ PO TBCR: EXTENDED_RELEASE_TABLET | ORAL | Status: DC

## 2016-06-18 ENCOUNTER — Encounter: Payer: Self-pay | Admitting: Cardiology

## 2016-06-20 ENCOUNTER — Other Ambulatory Visit: Payer: Self-pay | Admitting: Internal Medicine

## 2016-06-27 DIAGNOSIS — H40013 Open angle with borderline findings, low risk, bilateral: Secondary | ICD-10-CM | POA: Diagnosis not present

## 2016-06-27 DIAGNOSIS — H353130 Nonexudative age-related macular degeneration, bilateral, stage unspecified: Secondary | ICD-10-CM | POA: Diagnosis not present

## 2016-06-27 DIAGNOSIS — H2513 Age-related nuclear cataract, bilateral: Secondary | ICD-10-CM | POA: Diagnosis not present

## 2016-06-27 DIAGNOSIS — H25013 Cortical age-related cataract, bilateral: Secondary | ICD-10-CM | POA: Diagnosis not present

## 2016-06-30 ENCOUNTER — Other Ambulatory Visit (HOSPITAL_COMMUNITY): Payer: Self-pay | Admitting: Internal Medicine

## 2016-07-02 ENCOUNTER — Encounter (INDEPENDENT_AMBULATORY_CARE_PROVIDER_SITE_OTHER): Payer: Self-pay

## 2016-07-02 ENCOUNTER — Encounter: Payer: Self-pay | Admitting: Internal Medicine

## 2016-07-02 ENCOUNTER — Ambulatory Visit (INDEPENDENT_AMBULATORY_CARE_PROVIDER_SITE_OTHER): Payer: Medicare Other | Admitting: Internal Medicine

## 2016-07-02 VITALS — BP 118/78 | HR 67 | Temp 98.1°F | Resp 12 | Ht 64.0 in | Wt 186.2 lb

## 2016-07-02 DIAGNOSIS — E559 Vitamin D deficiency, unspecified: Secondary | ICD-10-CM | POA: Diagnosis not present

## 2016-07-02 DIAGNOSIS — Z23 Encounter for immunization: Secondary | ICD-10-CM | POA: Diagnosis not present

## 2016-07-02 DIAGNOSIS — M25569 Pain in unspecified knee: Secondary | ICD-10-CM

## 2016-07-02 DIAGNOSIS — F329 Major depressive disorder, single episode, unspecified: Secondary | ICD-10-CM

## 2016-07-02 DIAGNOSIS — G4733 Obstructive sleep apnea (adult) (pediatric): Secondary | ICD-10-CM

## 2016-07-02 DIAGNOSIS — I509 Heart failure, unspecified: Secondary | ICD-10-CM

## 2016-07-02 DIAGNOSIS — Z0000 Encounter for general adult medical examination without abnormal findings: Secondary | ICD-10-CM

## 2016-07-02 DIAGNOSIS — F32A Depression, unspecified: Secondary | ICD-10-CM

## 2016-07-02 LAB — BASIC METABOLIC PANEL
BUN: 14 mg/dL (ref 6–23)
CO2: 30 mEq/L (ref 19–32)
Calcium: 9.6 mg/dL (ref 8.4–10.5)
Chloride: 104 mEq/L (ref 96–112)
Creatinine, Ser: 0.78 mg/dL (ref 0.40–1.20)
GFR: 78.28 mL/min (ref >60.00)
Glucose, Bld: 102 mg/dL — ABNORMAL HIGH (ref 70–99)
POTASSIUM: 4.2 meq/L (ref 3.5–5.1)
SODIUM: 138 meq/L (ref 135–145)

## 2016-07-02 LAB — LIPID PANEL
CHOLESTEROL: 191 mg/dL (ref 0–200)
HDL: 44 mg/dL (ref >39.00)
LDL Cholesterol: 118 mg/dL — ABNORMAL HIGH (ref 0–99)
NonHDL: 146.77
Total CHOL/HDL Ratio: 4
Triglycerides: 142 mg/dL (ref 0.0–149.0)
VLDL: 28.4 mg/dL (ref 0.0–40.0)

## 2016-07-02 MED ORDER — GABAPENTIN 300 MG PO CAPS: 300.0000 mg | ORAL_CAPSULE | Freq: Two times a day (BID) | ORAL | 6 refills | Status: DC

## 2016-07-02 NOTE — Assessment & Plan Note (Addendum)
Td 2013;  pneumonia 2008; booster 06-2016 prevnar-- 2015;   zostavax 2013    Cscope 2003, and 11-2011, next 10 years (Dr Deatra Ina) PAP  8--2009   01-2010, 2013 (normal)-- needs one additional PAP, if negative we can stop per guidelines. Reluctant to proceed, will discuss next year  MMG-- h/o mastectomy , self chest examination normal per patient   Labs reviewed, due for a vitamin D Diet and exercise discussed .

## 2016-07-02 NOTE — Patient Instructions (Addendum)
Get your blood work before you leave   Next visit in 6 months  Consider healthcare power of attorney  Fall Prevention and Home Safety Falls cause injuries and can affect all age groups. It is possible to use preventive measures to significantly decrease the likelihood of falls. There are many simple measures which can make your home safer and prevent falls. OUTDOORS  Repair cracks and edges of walkways and driveways.  Remove high doorway thresholds.  Trim shrubbery on the main path into your home.  Have good outside lighting.  Clear walkways of tools, rocks, debris, and clutter.  Check that handrails are not broken and are securely fastened. Both sides of steps should have handrails.  Have leaves, snow, and ice cleared regularly.  Use sand or salt on walkways during winter months.  In the garage, clean up grease or oil spills. BATHROOM  Install night lights.  Install grab bars by the toilet and in the tub and shower.  Use non-skid mats or decals in the tub or shower.  Place a plastic non-slip stool in the shower to sit on, if needed.  Keep floors dry and clean up all water on the floor immediately.  Remove soap buildup in the tub or shower on a regular basis.  Secure bath mats with non-slip, double-sided rug tape.  Remove throw rugs and tripping hazards from the floors. BEDROOMS  Install night lights.  Make sure a bedside light is easy to reach.  Do not use oversized bedding.  Keep a telephone by your bedside.  Have a firm chair with side arms to use for getting dressed.  Remove throw rugs and tripping hazards from the floor. KITCHEN  Keep handles on pots and pans turned toward the center of the stove. Use back burners when possible.  Clean up spills quickly and allow time for drying.  Avoid walking on wet floors.  Avoid hot utensils and knives.  Position shelves so they are not too high or low.  Place commonly used objects within easy reach.  If  necessary, use a sturdy step stool with a grab bar when reaching.  Keep electrical cables out of the way.  Do not use floor polish or wax that makes floors slippery. If you must use wax, use non-skid floor wax.  Remove throw rugs and tripping hazards from the floor. STAIRWAYS  Never leave objects on stairs.  Place handrails on both sides of stairways and use them. Fix any loose handrails. Make sure handrails on both sides of the stairways are as long as the stairs.  Check carpeting to make sure it is firmly attached along stairs. Make repairs to worn or loose carpet promptly.  Avoid placing throw rugs at the top or bottom of stairways, or properly secure the rug with carpet tape to prevent slippage. Get rid of throw rugs, if possible.  Have an electrician put in a light switch at the top and bottom of the stairs. OTHER FALL PREVENTION TIPS  Wear low-heel or rubber-soled shoes that are supportive and fit well. Wear closed toe shoes.  When using a stepladder, make sure it is fully opened and both spreaders are firmly locked. Do not climb a closed stepladder.  Add color or contrast paint or tape to grab bars and handrails in your home. Place contrasting color strips on first and last steps.  Learn and use mobility aids as needed. Install an electrical emergency response system.  Turn on lights to avoid dark areas. Replace light bulbs that  out immediately. Get light switches that glow.  Arrange furniture to create clear pathways. Keep furniture in the same place.  Firmly attach carpet with non-skid or double-sided tape.  Eliminate uneven floor surfaces.  Select a carpet pattern that does not visually hide the edge of steps.  Be aware of all pets. OTHER HOME SAFETY TIPS  Set the water temperature for 120 F (48.8 C).  Keep emergency numbers on or near the telephone.  Keep smoke detectors on every level of the home and near sleeping areas. Document Released: 10/31/2002 Document  Revised: 05/11/2012 Document Reviewed: 01/30/2012 ExitCare Patient Information 2015 [employer]. This information is not intended to replace advice given to you by your health care provider. Make sure you discuss any questions you have with your health care provider.   Preventive Care for Adults Ages 65 and over  Blood pressure check.** / Every 1 to 2 years.  Lipid and cholesterol check.**/ Every 5 years beginning at age 20.  Lung cancer screening. / Every year if you are aged 55-80 years and have a 30-pack-year history of smoking and currently smoke or have quit within the past 15 years. Yearly screening is stopped once you have quit smoking for at least 15 years or develop a health problem that would prevent you from having lung cancer treatment.  Fecal occult blood test (FOBT) of stool. / Every year beginning at age 50 and continuing until age 75. You may not have to do this test if you get a colonoscopy every 10 years.  Flexible sigmoidoscopy** or colonoscopy.** / Every 5 years for a flexible sigmoidoscopy or every 10 years for a colonoscopy beginning at age 50 and continuing until age 75.  Hepatitis C blood test.** / For all people born from 1945 through 1965 and any individual with known risks for hepatitis C.  Abdominal aortic aneurysm (AAA) screening.** / A one-time screening for ages 65 to 75 years who are current or former smokers.  Skin self-exam. / Monthly.  Influenza vaccine. / Every year.  Tetanus, diphtheria, and acellular pertussis (Tdap/Td) vaccine.** / 1 dose of Td every 10 years.  Varicella vaccine.** / Consult your health care provider.  Zoster vaccine.** / 1 dose for adults aged 60 years or older.  Pneumococcal 13-valent conjugate (PCV13) vaccine.** / Consult your health care provider.  Pneumococcal polysaccharide (PPSV23) vaccine.** / 1 dose for all adults aged 65 years and older.  Meningococcal vaccine.** / Consult your health care provider.  Hepatitis  A vaccine.** / Consult your health care provider.  Hepatitis B vaccine.** / Consult your health care provider.  Haemophilus influenzae type b (Hib) vaccine.** / Consult your health care provider. **Family history and personal history of risk and conditions may change your health care provider's recommendations. Document Released: 01/06/2002 Document Revised: 11/15/2013 Document Reviewed: 04/07/2011 ExitCare Patient Information 2015 [employer]. This information is not intended to replace advice given to you by your health care provider. Make sure you discuss any questions you have with your health care provider.   

## 2016-07-02 NOTE — Progress Notes (Signed)
Subjective:    Patient ID: [name], female    DOB: 1951-11-23, 65 y.o.   MRN: [id number]  DOS:  07/02/2016 Type of visit - description :  Here for Medicare AWV:   1. Risk factors based on Past M, S, F history: reviewed 2. Physical Activities:  Active at home, yard work 3. Depression/mood: neg screening , on medications 4. Hearing:  No problems noted or reported  5. ADL's: independent, drives  6. Fall Risk: no recent falls, prevention discussed , see AVS 7. home Safety: does feel safe at home  8. Height, weight, & visual acuity: see VS, sees eye doctor regulalrly 9. Counseling: provided 10. Labs ordered based on risk factors: if needed  11. Referral Coordination: if needed 12. Care Plan, see assessment and plan , written personalized plan provided , see AVS 13. Cognitive Assessment: motor skills and cognition appropriate for age 88. Care team updated   15. End-of-life care , rec a HC POA    In addition, today we discussed the following: Cardiovascular: Cardiology note reviewed, she seems stable DJD: She complained of knee pain the last time she was here, doing better, not taking meloxicam She complained of dysphagia before, started PPIs, now asymptomatic OSA: Cardiology note reviewed    Review of Systems  Constitutional: No fever. No chills. No unexplained wt changes. No unusual sweats  HEENT: No dental problems, no ear discharge, no facial swelling, no voice changes. No eye discharge, no eye  redness , no  intolerance to light   Respiratory: No wheezing , no  difficulty breathing. used to have chronic cough, it has come back, mild, sporadic no mucus production  Cardiovascular: No CP, no leg swelling , no  Palpitations  GI: no nausea, no vomiting, no diarrhea , no  abdominal pain.  No blood in the stools. No dysphagia, no odynophagia    Endocrine: No polyphagia, no polyuria , no polydipsia  GU: No dysuria, gross hematuria, difficulty urinating. No urinary  urgency, no frequency.  Musculoskeletal: No joint swellings or unusual aches or pains  Skin: No change in the color of the skin, palor , no  Rash  Allergic, immunologic: No environmental allergies , no  food allergies  Neurological: No dizziness no  syncope. No headaches. No diplopia, no slurred, no slurred speech, no motor deficits, no facial  Numbness  Hematological: No enlarged lymph nodes, no easy bruising , no unusual bleedings  Psychiatry: No suicidal ideas, no hallucinations, no beavior problems, no confusion.  No unusual/severe anxiety, no depression  Past Medical History:  Diagnosis Date  . Breast CA ([place])    twice first on L breast 1982 w/ chest wall involvment, then a second breast ca on the R in 1987  . Chronic systolic CHF (congestive heart failure) ([place])    a. due to Adriamycin,s/p ICD;  b. 01/2013 Gen change and new LV lead - BSX Energen CRT-D BiV ICD, Ser # [id number]  . Depression   . Diverticulitis   . Fatigue 02/20/2016  . Menopause    since age 67s  . Nonischemic cardiomyopathy ([place])   . OSA (obstructive sleep apnea) 02/07/2016  . Osteopenia   . Renal calculus   . Snoring 02/20/2016    Past Surgical History:  Procedure Laterality Date  . BI-VENTRICULAR IMPLANTABLE CARDIOVERTER DEFIBRILLATOR UPGRADE N/A 02/04/2013   Procedure: BI-VENTRICULAR IMPLANTABLE CARDIOVERTER DEFIBRILLATOR UPGRADE;  Surgeon: Evans Lance, MD;  Location: [facility] CATH LAB;  Service: Cardiovascular;  Laterality: N/A;  . CARDIAC DEFIBRILLATOR  PLACEMENT     AICD Replaced-5/09 and 2014  . LEFT AND RIGHT HEART CATHETERIZATION WITH CORONARY ANGIOGRAM N/A 10/06/2013   Procedure: LEFT AND RIGHT HEART CATHETERIZATION WITH CORONARY ANGIOGRAM;  Surgeon: Jolaine Artist, MD;  Location: [facility] CATH LAB;  Service: Cardiovascular;  Laterality: N/A;  . LITHOTRIPSY     h/o several procedures   . MASTECTOMY     B  . OOPHORECTOMY     B, per genticists advise at [place]  . VENOGRAM N/A 10/20/2012    Procedure: VENOGRAM;  Surgeon: Deboraha Sprang, MD;  Location: [facility] CATH LAB;  Service: Cardiovascular;  Laterality: N/A;    Social History   Social History  . Marital status: Married    Spouse name: N/A  . Number of children: 3  . Years of education: N/A   Occupational History  . 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 Smoking status: Never Smoker  . Smokeless tobacco: Never Used  . Alcohol use Yes     Comment: socially/occassionally  . Drug use: No  . Sexual activity: Not on file   Other Topics Concern  . Not on file   Social History Narrative   Related to Mr and Mrs Huston Foley, they are my patients as well    Married, 3 children all in [place], 7 Gkids           Family History  Problem Relation Age of Onset  . Kidney cancer Brother   . Breast cancer Mother     M and sister   . Heart attack Neg Hx   . Diabetes Neg Hx   . Colon cancer Neg Hx        Medication List       Accurate as of 07/02/16  5:34 PM. Always use your most recent med list.          ascorbic acid 1000 MG tablet Commonly known as:  VITAMIN C Take 1,000 mg by mouth daily.   Biotin 5000 MCG Caps Take 1 capsule by mouth daily.   carvedilol 12.5 MG tablet Commonly known as:  COREG TAKE 1 TABLET BY MOUTH TWICE DAILY WITH MEALS   digoxin 0.125 MG tablet Commonly known as:  DIGOX Take 1 tablet (125 mcg total) by mouth daily.   furosemide 40 MG tablet Commonly known as:  LASIX TAKE 2 TABLETS BY MOUTH EVERY MORNING AND 1 TABLET EVERY EVENING   gabapentin 300 MG capsule Commonly known as:  NEURONTIN Take 1 capsule (300 mg total) by mouth 2 (two) times daily.   multivitamin per tablet Take 1 tablet by mouth daily.   pantoprazole 40 MG tablet Commonly known as:  PROTONIX Take 1 tablet (40 mg total) by mouth daily.   potassium chloride 10 MEQ tablet Commonly known as:  KLOR-CON M10 TAKE 3 TABLETS (30 MEQ TOTAL) BY MOUTH DAILY.   PROBIOTIC  DAILY PO Take 1 tablet by mouth daily.   sacubitril-valsartan 24-26 MG Commonly known as:  ENTRESTO Take 1 tablet by mouth 2 (two) times daily.   sertraline 25 MG tablet Commonly known as:  ZOLOFT Take 1 tablet (25 mg total) by mouth daily.   spironolactone 25 MG tablet Commonly known as:  ALDACTONE TAKE 1 TABLET BY MOUTH DAILY.   vitamin B-12 1000 MCG tablet Commonly known as:  CYANOCOBALAMIN Take 1,000 mcg by mouth daily.   zolpidem 10 MG tablet Commonly known as:  AMBIEN Take 1 tablet (  10 mg total) by mouth at bedtime as needed for sleep.          Objective:   Physical Exam BP 118/78 (BP Location: Left Arm, Patient Position: Sitting, Cuff Size: Normal)   Pulse 67   Temp 98.1 F (36.7 C) (Oral)   Resp 12   Ht 5\' 4"  (1.626 m)   Wt 186 lb 4 oz (84.5 kg)   SpO2 96%   BMI 31.97 kg/m   General:   Well developed, well nourished . NAD.  Neck: No  thyromegaly , no LAD is HEENT:  Normocephalic . Face symmetric, atraumatic Lungs:  CTA B Normal respiratory effort, no intercostal retractions, no accessory muscle use. Heart: RRR,  signs systolic murmur.  No pretibial edema bilaterally  Abdomen:  Not distended, soft, non-tender. No rebound or rigidity.   Skin: Exposed areas without rash. Not pale. Not jaundice Neurologic:  alert & oriented X3.  Speech normal, gait appropriate for age and unassisted Strength symmetric and appropriate for age.  Psych: Cognition and judgment appear intact.  Cooperative with normal attention span and concentration.  Behavior appropriate. No anxious or depressed appearing.    Assessment & Plan:    Assessment CHF, nonischemic cardiomyopathy:due to chemotherapy Depression Mild OSA per sleep study 11-2015, saw Dr Radford Pax 02-20-16: no need for CPAP Osteopenia-- DEXA 06/13/2015 normal Vitamin D deficiency Kidney stones Menopause Breast cancer x 2: first in 1982 with chest wall involvement, 2nd on the R dx  1987, s/p B  mastectomy Handicap sticker signed 06-2016  PLAN: CHF: Saw cardiology 2 months ago, felt to be stable. No changes made. Check a BMP, FLP and digoxin level Depression: Controlled Mild OSA: Saw Dr. Radford Pax 5 months ago, no need for CPAP Osteopenia: DEXA 06/13/2015 normal, recommend to stay active and vitamin D supplements Vitamin D deficiency: Check labs Chronic cough: Better after she started PPIs but still there, add Flonase Dysphagia: Better after PPIs. Knee pain: Better, not taking meloxicam. Neuropathy: also reports shingles in the past, area ~ R forehead still  hurts daily, worse w/ the touch of glasses, used to take gabapentin, would like to restart; rx printed, 300mg  bid, no red flags/interactions noted , increase dose if needed RTC 6 months.    Today, I spent more than 25  min with the patient(in addition to the regular medical physical) >50% of the time counseling regards her multiple medical problems and concerns including the resurface of neuropathy pain, reviewing  notes from other providers, as well as old results.

## 2016-07-02 NOTE — Progress Notes (Signed)
Pre visit review using our clinic review tool, if applicable. No additional management support is needed unless otherwise documented below in the visit note. 

## 2016-07-02 NOTE — Assessment & Plan Note (Signed)
CHF: Saw cardiology 2 months ago, felt to be stable. No changes made. Check a BMP, FLP and digoxin level Depression: Controlled Mild OSA: Saw Dr. Radford Pax 5 months ago, no need for CPAP Osteopenia: DEXA 06/13/2015 normal, recommend to stay active and vitamin D supplements Vitamin D deficiency: Check labs Chronic cough: Better after she started PPIs but still there, add Flonase Dysphagia: Better after PPIs. Knee pain: Better, not taking meloxicam. Neuropathy: also reports shingles in the past, area ~ R forehead still  hurts daily, worse w/ the touch of glasses, used to take gabapentin, would like to restart; rx printed, 300mg  bid, no red flags/interactions noted , increase dose if needed RTC 6 months.

## 2016-07-03 LAB — DIGOXIN LEVEL: DIGOXIN LVL: 0.8 ug/L (ref 0.8–2.0)

## 2016-07-04 LAB — VITAMIN D 1,25 DIHYDROXY
VITAMIN D2 1, 25 (OH): 10 pg/mL
Vitamin D 1, 25 (OH)2 Total: 52 pg/mL (ref 18–72)
Vitamin D3 1, 25 (OH)2: 42 pg/mL

## 2016-07-23 ENCOUNTER — Other Ambulatory Visit: Payer: Self-pay | Admitting: Internal Medicine

## 2016-08-07 ENCOUNTER — Ambulatory Visit (HOSPITAL_BASED_OUTPATIENT_CLINIC_OR_DEPARTMENT_OTHER)
Admission: RE | Admit: 2016-08-07 | Discharge: 2016-08-07 | Disposition: A | Discharge status: Home / Self Care | Payer: Medicare Other | Source: Ambulatory Visit | Attending: Internal Medicine | Admitting: Internal Medicine

## 2016-08-07 ENCOUNTER — Encounter (HOSPITAL_COMMUNITY): Payer: Self-pay | Admitting: Internal Medicine

## 2016-08-07 ENCOUNTER — Ambulatory Visit (HOSPITAL_COMMUNITY)
Admission: RE | Admit: 2016-08-07 | Discharge: 2016-08-07 | Disposition: A | Discharge status: Home / Self Care | Payer: Medicare Other | Source: Ambulatory Visit | Attending: Internal Medicine | Admitting: Internal Medicine

## 2016-08-07 VITALS — BP 102/58 | HR 56 | Wt 191.5 lb

## 2016-08-07 DIAGNOSIS — I5022 Chronic systolic (congestive) heart failure: Secondary | ICD-10-CM

## 2016-08-07 DIAGNOSIS — Z95810 Presence of automatic (implantable) cardiac defibrillator: Secondary | ICD-10-CM | POA: Diagnosis not present

## 2016-08-07 DIAGNOSIS — Z853 Personal history of malignant neoplasm of breast: Secondary | ICD-10-CM | POA: Insufficient documentation

## 2016-08-07 DIAGNOSIS — F329 Major depressive disorder, single episode, unspecified: Secondary | ICD-10-CM | POA: Insufficient documentation

## 2016-08-07 DIAGNOSIS — I359 Nonrheumatic aortic valve disorder, unspecified: Secondary | ICD-10-CM | POA: Diagnosis not present

## 2016-08-07 DIAGNOSIS — G4733 Obstructive sleep apnea (adult) (pediatric): Secondary | ICD-10-CM | POA: Insufficient documentation

## 2016-08-07 DIAGNOSIS — I429 Cardiomyopathy, unspecified: Secondary | ICD-10-CM | POA: Insufficient documentation

## 2016-08-07 DIAGNOSIS — M8580 Other specified disorders of bone density and structure, unspecified site: Secondary | ICD-10-CM | POA: Insufficient documentation

## 2016-08-07 LAB — ECHOCARDIOGRAM COMPLETE
AV Area VTI index: 0.79 cm2/m2
AV Area mean vel: 1.19 cm2
AV Mean grad: 10 mmHg
AV Peak grad: 22 mmHg
AV pk vel: 234 cm/s
AV vel: 1.5
AVAREAMEANVIN: 0.62 cm2/m2
AVAREAVTI: 1.23 cm2
AVCELMEANRAT: 0.31
AVPHT: 465 ms
Ao pk vel: 0.32 m/s
CHL CUP AV PEAK INDEX: 0.65
CHL CUP MV DEC (S): 317
CHL CUP TV REG PEAK VELOCITY: 268 cm/s
DOP CAL AO MEAN VELOCITY: 146 cm/s
E decel time: 317 msec
EERAT: 34.14
FS: 7 % — AB (ref 28–44)
IVS/LV PW RATIO, ED: 1.09
LA ID, A-P, ES: 38 mm
LA diam index: 2 cm/m2
LA vol A4C: 63.4 ml
LDCA: 3.8 cm2
LEFT ATRIUM END SYS DIAM: 38 mm
LV E/e'average: 34.14
LV PW d: 11.3 mm — AB (ref 0.6–1.1)
LV TDI E'LATERAL: 4.54
LV TDI E'MEDIAL: 3.49
LV dias vol index: 157 mL/m2
LV dias vol: 299 mL — AB (ref 46–106)
LV e' LATERAL: 4.54 cm/s
LV sys vol index: 115 mL/m2
LVEEMED: 34.14
LVOT SV: 80 mL
LVOT VTI: 21 cm
LVOT peak vel: 75.7 cm/s
LVOTD: 22 mm
LVOTVTI: 0.4 cm
LVSYSVOL: 219 mL — AB (ref 14–42)
MV Peak grad: 10 mmHg
MV pk E vel: 155 m/s
MVPKAVEL: 67.7 m/s
RV sys press: 32 mmHg
Simpson's disk: 27
Stroke v: 80 ml
TR max vel: 268 cm/s
VTI: 53.1 cm
Valve area index: 0.79
Valve area: 1.5 cm2

## 2016-08-07 LAB — BASIC METABOLIC PANEL
ANION GAP: 9 (ref 5–15)
BUN: 13 mg/dL (ref 6–20)
CALCIUM: 9.6 mg/dL (ref 8.9–10.3)
CHLORIDE: 107 mmol/L (ref 101–111)
CO2: 22 mmol/L (ref 22–32)
CREATININE: 0.81 mg/dL (ref 0.44–1.00)
GFR calc Af Amer: >60 mL/min (ref >60)
GFR calc non Af Amer: >60 mL/min (ref >60)
Glucose, Bld: 97 mg/dL (ref 65–99)
Potassium: 4.6 mmol/L (ref 3.5–5.1)
Sodium: 138 mmol/L (ref 135–145)

## 2016-08-07 LAB — DIGOXIN LEVEL: DIGOXIN LVL: 0.7 ng/mL — AB (ref 0.8–2.0)

## 2016-08-07 LAB — BRAIN NATRIURETIC PEPTIDE: B Natriuretic Peptide: 333.3 pg/mL — ABNORMAL HIGH (ref 0.0–100.0)

## 2016-08-07 NOTE — Progress Notes (Signed)
ADVANCED HEART FAILURE CLINIC NOTE  Patient ID: [name], female   DOB: 1951-10-11, 65 y.o.   MRN: [id number] PCP: Dr Larose Kells EP: Dr Caryl Comes  Subjective Ms. Bruzek is a 65 y/o woman with h/o chronic systolic presumed due to chemotherapy-related cardiomyopathy diagnosed with breast cancer in 1982 in [place]. Treated with chemo (including adriamycin)/XRT and L mastectomy. Had R breast cancer (unrelated) in 1987. Has been cancer free since. Denies HTN, HL, DM2.  Was first diagnosed with HF in 1988. Had left heart cath she thinks in 2009. 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 Echo 2009 EF 20% with moderate AI and mild to moderate MR. Had [place] ICD placed and then LV lead became nonfunctional. In 3/14, underwent CRT revision.   Previously on coumadin due to cardiomyopathy but discontinued.   She is here for routine f/u. Overall fairly stable. Able to do ADLs without problem. Gets tired easily but improves easily with rest. Feels about the same. No edema, orthopnea or PND. Weighing every day. Weight stable. No problems with meds. Now on Entresto 24/26. BP running low. Rare orthostasis. Had sleep study 1/17 very mild OSA (6.2/hr) not using CPAP.   ECHO 09/2013 with Dr. Wynonia Lawman and EF 10-15% with significant dyssynchrony. Subsequently had LV lead checked and was functioning well.  ECHO 7/15 EF 10-15%, moderate to severe RV dysfunction ECHO 4/16 EF 15% normal RV moderate AI ECHO 9/17 EF 20-25% RV ok. 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   CPX 10/25/13  Peak VO2: 15.6 (81.2% predicted peak VO2) VE/VCO2 slope: 36.9 OUES: 1.25 Peak RER: 1.08  CPX 05/24/14 FVC 3.05 (92%)  FEV1 2.23 (88%)  FEV1/FVC  73%  MVV 92 (100%) Resting HR: 64 Peak HR: 132 (85% age predicted max HR) BP rest: 108/70 BP peak: 130/66 Peak VO2: 16.4 (81.1% predicted peak VO2) VE/VCO2 slope: 33.6 OUES: 1.74 Peak RER: 1.16 Ventilatory Threshold: 12.5 (61.8% predicted peak VO2) VE/MVV: 62% PETCO2 at peak: 31 O2pulse: 12 (109% predicted O2pulse)    Lab 10/06/13 K 2.8 Creatinine 0.83 Labs 10/31/13 K 3.0 Creatinine 0.68  Dig level 0.9 Pro BNP 3906  Labs 11/11/13  K 3.6 Creatinine 0.8 Labs 12/16/13 K 3.8 Creatinine 0.8  Labs 4/15 K 4, creatinine 0.8, LDL 128, TSH normal, digoxin 0.6 Labs 6/15 K 4.1 Cr 0.8 digoxin 0.7 Labs 8/15 K 4.2, creatinine 0.78, pBNP 2126 Labs 8/15 K 4.2, creatinine 1.0 dig <0.5  ECG: NSR, BiV paced  Blood type A+   FHX: Had older brother with HF in his 46s. No strong FHx of HF.   SocHx: Retired. Non-smoker. Rare ETOH. 3 grown kids.    Past Medical History:  Diagnosis Date  . Breast CA ([place])    twice first on L breast 1982 w/ chest wall involvment, then a second breast ca on the R in 1987  . Chronic systolic CHF (congestive heart failure) ([place])    a. due to Adriamycin,s/p ICD;  b. 01/2013 Gen change and new LV lead - BSX Energen CRT-D BiV ICD, Ser # [id number]  . Depression   . Diverticulitis   .  Early menopause    early 88s  . Fatigue 02/20/2016  . Glaucoma suspect of both eyes    Dr. Marshall Cork  . Nonischemic cardiomyopathy ([place])   . OSA (obstructive sleep apnea) 02/07/2016  . Osteopenia   . Renal calculus   . Snoring 02/20/2016    Past Surgical History:  Procedure Laterality Date  . BI-VENTRICULAR IMPLANTABLE CARDIOVERTER DEFIBRILLATOR UPGRADE N/A 02/04/2013   Procedure: BI-VENTRICULAR IMPLANTABLE CARDIOVERTER DEFIBRILLATOR UPGRADE;  Surgeon: Evans Lance, MD;  Location[facility] CATH LAB;  Service: Cardiovascular;  Laterality: N/A;  . CARDIAC DEFIBRILLATOR PLACEMENT     AICD Replaced-5/09 and 2014  . LEFT AND RIGHT HEART CATHETERIZATION WITH CORONARY ANGIOGRAM N/A  10/06/2013   Procedure: LEFT AND RIGHT HEART CATHETERIZATION WITH CORONARY ANGIOGRAM;  Surgeon: Jolaine Artist, MD;  Location: [facility] CATH LAB;  Service: Cardiovascular;  Laterality: N/A;  . LITHOTRIPSY     h/o several procedures   . MASTECTOMY     B  . OOPHORECTOMY     B, per genticists advise at [place]  . VENOGRAM N/A 10/20/2012   Procedure: VENOGRAM;  Surgeon: Deboraha Sprang, MD;  Location: [facility] CATH LAB;  Service: Cardiovascular;  Laterality: N/A;    Current Outpatient Prescriptions  Medication Sig Dispense Refill  . ascorbic acid (VITAMIN C) 1000 MG tablet Take 1,000 mg by mouth daily.    . Biotin 5000 MCG CAPS Take 1 capsule by mouth daily.    . carvedilol (COREG) 12.5 MG tablet TAKE 1 TABLET BY MOUTH TWICE DAILY WITH MEALS 60 tablet 6  . digoxin (DIGOX) 0.125 MG tablet Take 1 tablet (125 mcg total) by mouth daily. 30 tablet 6  . furosemide (LASIX) 40 MG tablet TAKE 2 TABLETS BY MOUTH EVERY MORNING AND 1 TABLET EVERY EVENING 90 tablet 5  . gabapentin (NEURONTIN) 300 MG capsule Take 1 capsule (300 mg total) by mouth 2 (two) times daily. 60 capsule 6  . multivitamin (THERAGRAN) per tablet Take 1 tablet by mouth daily.     . pantoprazole (PROTONIX) 40 MG tablet Take 1 tablet (40 mg total) by mouth daily. 30 tablet 6  . potassium chloride (KLOR-CON M10) 10 MEQ tablet TAKE 3 TABLETS (30 MEQ TOTAL) BY MOUTH DAILY. 90 tablet 6  . Probiotic Product (PROBIOTIC DAILY PO) Take 1 tablet by mouth daily.    . sacubitril-valsartan (ENTRESTO) 24-26 MG Take 1 tablet by mouth 2 (two) times daily. 60 tablet 5  . sertraline (ZOLOFT) 25 MG tablet Take 1 tablet (25 mg total) by mouth daily. 30 tablet 5  . spironolactone (ALDACTONE) 25 MG tablet TAKE 1 TABLET BY MOUTH DAILY. 90 tablet 3  . vitamin B-12 (CYANOCOBALAMIN) 1000 MCG tablet Take 1,000 mcg by mouth daily.    Marland Kitchen zolpidem (AMBIEN) 10 MG tablet Take 1 tablet (10 mg total) by mouth at bedtime as needed for sleep. 90 tablet 1   No current  facility-administered medications for this encounter.     Allergies  Allergen Reactions  . Atacand [Candesartan]     Fatigue   . Cephalexin     REACTION: itching, hives  . Latex Rash    Review of Systems negative except from HPI and PMH  Physical Exam BP (!) 102/58   Pulse (!) 56   Wt 191 lb 8 oz (86.9 kg)   SpO2 97%   BMI 32.87 kg/m  General: Well developed and well nourished. NAD  HEENT:  normal Neck: Supple JVP 5-6, no carotid bruit Lungs: Clear to ausculation  Cor: RRR . Soft AI murmur Ad: obese Soft. NT ND. No hepatosplenomegaly good bowel sounds Extremities: warm No clubbing cyanosis, edema Alert and oriented, grossly normal motor and sensory function Skin:  no rash Neuro: A and O x 3 affect; normal.   Assessment and  Plan  1) Chronic systolic HF: Nonischemic cardiomyopathy, possibly anthracycline cardiotoxicity.  LHC 11/14 without significant coronary disease.    Has [employer] CRT-D. ECHO 09/2013 EF 10-15%. Echo at [facility] 4/16 EF 15% moderate AI.  Seen in [facility] and will follow prn - NYHA III, stable. Volume status ok.  - Continue Entresto, Coreg, digoxin, spironolactone at current doses.  - Echo images reviewed personally EF slightly better but with severe MR - Will repeat CPX to re-risk stratify - Has previously seen Dr. Stann Mainland at [facility] and will refer back as needed. (Blood type A positive) - Check labs today including BMET and digoxin  2) h/o bilateral breast CA in 1982 and 1987 (treated with Adriamycin in 1982) 3) ICD - f/u with Dr. Rhunette Croft MD 08/07/2016

## 2016-08-07 NOTE — Progress Notes (Signed)
  Echocardiogram 2D Echocardiogram has been performed.  Jennette Dubin 08/07/2016, 11:08 AM

## 2016-08-07 NOTE — Addendum Note (Signed)
Encounter addended by: Scarlette Calico, RN on: 08/07/2016 11:56 AM<BR>    Actions taken: Order Entry activity accessed, Diagnosis association updated, Sign clinical note

## 2016-08-07 NOTE — Patient Instructions (Signed)
Labs today  Your physician has recommended that you have a cardiopulmonary stress test (CPX). CPX testing is a non-invasive measurement of heart and lung function. It replaces a traditional treadmill stress test. This type of test provides a tremendous amount of information that relates not only to your present condition but also for future outcomes. This test combines measurements of you ventilation, respiratory gas exchange in the lungs, electrocardiogram (EKG), blood pressure and physical response before, during, and following an exercise protocol.  We will contact you in 4 months to schedule your next appointment.

## 2016-08-20 ENCOUNTER — Encounter (HOSPITAL_COMMUNITY): Payer: Medicare Other

## 2016-08-22 ENCOUNTER — Encounter (HOSPITAL_COMMUNITY): Payer: Medicare Other

## 2016-09-03 ENCOUNTER — Ambulatory Visit (HOSPITAL_COMMUNITY): Payer: Medicare Other | Attending: Internal Medicine

## 2016-09-03 DIAGNOSIS — I5022 Chronic systolic (congestive) heart failure: Secondary | ICD-10-CM | POA: Insufficient documentation

## 2016-09-15 ENCOUNTER — Telehealth: Payer: Self-pay

## 2016-09-15 ENCOUNTER — Other Ambulatory Visit: Payer: Self-pay | Admitting: Internal Medicine

## 2016-09-15 NOTE — Telephone Encounter (Signed)
Ok 90 and 1 RF 

## 2016-09-15 NOTE — Telephone Encounter (Signed)
Pt is requesting refill on Ambien.  Last OV: 07/02/2016 Last Fill: 03/12/2016 #90 and 1RF   Please advise.

## 2016-09-16 MED ORDER — ZOLPIDEM TARTRATE 10 MG PO TABS: 10.0000 mg | ORAL_TABLET | Freq: Every evening | ORAL | 1 refills | Status: DC | PRN

## 2016-09-16 NOTE — Telephone Encounter (Signed)
Rx faxed to [facility] pharmacy.  

## 2016-09-16 NOTE — Telephone Encounter (Signed)
Rx printed, awaiting MD signature.  

## 2016-10-09 ENCOUNTER — Encounter (INDEPENDENT_AMBULATORY_CARE_PROVIDER_SITE_OTHER): Payer: Self-pay

## 2016-10-09 ENCOUNTER — Encounter: Payer: Self-pay | Admitting: Internal Medicine

## 2016-10-09 ENCOUNTER — Ambulatory Visit (INDEPENDENT_AMBULATORY_CARE_PROVIDER_SITE_OTHER): Payer: Medicare Other | Admitting: Internal Medicine

## 2016-10-09 VITALS — BP 98/60 | HR 89 | Ht 64.0 in | Wt 189.8 lb

## 2016-10-09 DIAGNOSIS — Z95810 Presence of automatic (implantable) cardiac defibrillator: Secondary | ICD-10-CM

## 2016-10-09 DIAGNOSIS — I447 Left bundle-branch block, unspecified: Secondary | ICD-10-CM

## 2016-10-09 DIAGNOSIS — I5022 Chronic systolic (congestive) heart failure: Secondary | ICD-10-CM

## 2016-10-09 DIAGNOSIS — I428 Other cardiomyopathies: Secondary | ICD-10-CM

## 2016-10-09 DIAGNOSIS — I472 Ventricular tachycardia: Secondary | ICD-10-CM | POA: Diagnosis not present

## 2016-10-09 DIAGNOSIS — I4729 Other ventricular tachycardia: Secondary | ICD-10-CM

## 2016-10-09 LAB — CUP PACEART INCLINIC DEVICE CHECK
Brady Statistic RA Percent Paced: 1 % — CL
Date Time Interrogation Session: 20171116050000
HighPow Impedance: 32 Ohm
HighPow Impedance: 63 Ohm
Implantable Lead Implant Date: 20050701
Implantable Lead Implant Date: 20050701
Implantable Lead Location: 753858
Lead Channel Impedance Value: 445 Ohm
Lead Channel Impedance Value: 484 Ohm
Lead Channel Pacing Threshold Amplitude: 1.7 V
Lead Channel Pacing Threshold Pulse Width: 0.6 ms
Lead Channel Sensing Intrinsic Amplitude: 3.6 mV
Lead Channel Setting Pacing Amplitude: 2.4 V
Lead Channel Setting Pacing Pulse Width: 0.6 ms
Lead Channel Setting Pacing Pulse Width: 0.6 ms
Lead Channel Setting Sensing Sensitivity: 1 mV
MDC IDC LEAD IMPLANT DT: 20140314
MDC IDC LEAD LOCATION: 753859
MDC IDC LEAD LOCATION: 753860
MDC IDC LEAD MODEL: 158
MDC IDC LEAD MODEL: 4196
MDC IDC LEAD SERIAL: 156416
MDC IDC MSMT LEADCHNL LV IMPEDANCE VALUE: 857 Ohm
MDC IDC MSMT LEADCHNL LV SENSING INTR AMPL: 20.7 mV
MDC IDC MSMT LEADCHNL RA PACING THRESHOLD AMPLITUDE: 0.9 V
MDC IDC MSMT LEADCHNL RA PACING THRESHOLD PULSEWIDTH: 0.4 ms
MDC IDC MSMT LEADCHNL RV PACING THRESHOLD AMPLITUDE: 1.7 V
MDC IDC MSMT LEADCHNL RV PACING THRESHOLD PULSEWIDTH: 0.4 ms
MDC IDC MSMT LEADCHNL RV SENSING INTR AMPL: 25 mV — AB
MDC IDC PG IMPLANT DT: 20140314
MDC IDC SET LEADCHNL RA PACING AMPLITUDE: 2 V
MDC IDC SET LEADCHNL RV PACING AMPLITUDE: 2.4 V
MDC IDC SET LEADCHNL RV SENSING SENSITIVITY: 0.5 mV
MDC IDC STAT BRADY RV PERCENT PACED: 100 %
Pulse Gen Serial Number: 111235

## 2016-10-09 NOTE — Patient Instructions (Signed)
Medication Instructions: Your physician recommends that you continue on your current medications as directed. Please refer to the Current Medication list given to you today.   Labwork: None Ordered  Procedures/Testing: None Ordered  Follow-Up: Your physician wants you to follow-up in: 6 MONTHS with Chanetta Marshall, NP. You will receive a reminder letter in the mail two months in advance. If you don't receive a letter, please call our office to schedule the follow-up appointment.  Remote monitoring is used to monitor your ICD from home. This monitoring reduces the number of office visits required to check your device to one time per year. It allows [place] to keep an eye on the functioning of your device to ensure it is working properly. You are scheduled for a device check from home on  01/08/17. You may send your transmission at any time that day. If you have a wireless device, the transmission will be sent automatically. After your physician reviews your transmission, you will receive a postcard with your next transmission date.   Any Additional Special Instructions Will Be Listed Below (If Applicable).     If you need a refill on your cardiac medications before your next appointment, please call your pharmacy.

## 2016-10-09 NOTE — Progress Notes (Signed)
seen    Patient Care Team: Colon Branch, MD as PCP - General Jolaine Artist, MD as Consulting Physician (Cardiology) Hortencia Pilar, MD as Consulting Physician (Ophthalmology)   HPI  [name] is a 65 y.o. female seen in followup for congestive heart failure in the setting of chemotherapy associated cardiomyopathy with previously implanted CRT. She did part of the MADIT CRT protocol; her LV lead had failed and was capped.  When she began to develop worsening symptoms of heart failure in the fall and in the winter 2014  she underwent CRT upgrade.   She has been followed by Dr. Wynonia Lawman and the heart failure clinic.  Heart failure notes from 9/17 were reviewed. She remains relatively stable.  She has chronic fatigue. She does not sleep well. She has daytime somnolence.    ECHO 09/2013 with Dr. Wynonia Lawman and EF 10-15% with significant dyssynchrony. Subsequently had LV lead checked and was functioning well.  ECHO 7/15 EF 10-15%, moderate to severe RV dysfunction ECHO 4/16 EF 15% normal RV moderate AI Echo 9/17 EF 20-25% severe MR CPX was also reviewed. "No significant cardiac limitation despite severe LV dysfunction "  Heart failure records reviewed  9/17 Cr 0.81 K4.6 Dig 0.7  Past Medical History:  Diagnosis Date  . Breast CA ([place])    twice first on L breast 1982 w/ chest wall involvment, then a second breast ca on the R in 1987  . Chronic systolic CHF (congestive heart failure) ([place])    a. due to Adriamycin,s/p ICD;  b. 01/2013 Gen change and new LV lead - BSX Energen CRT-D BiV ICD, Ser # [id number]  . Depression   . Diverticulitis   . Early menopause    early 52s  . Fatigue 02/20/2016  . Glaucoma suspect of both eyes    Dr. Marshall Cork  . Nonischemic cardiomyopathy ([place])   . OSA (obstructive sleep apnea) 02/07/2016  . Osteopenia   . Renal calculus   . Snoring 02/20/2016    Past Surgical History:  Procedure Laterality Date  . BI-VENTRICULAR  IMPLANTABLE CARDIOVERTER DEFIBRILLATOR UPGRADE N/A 02/04/2013   Procedure: BI-VENTRICULAR IMPLANTABLE CARDIOVERTER DEFIBRILLATOR UPGRADE;  Surgeon: Evans Lance, MD;  Location: [facility] CATH LAB;  Service: Cardiovascular;  Laterality: N/A;  . CARDIAC DEFIBRILLATOR PLACEMENT     AICD Replaced-5/09 and 2014  . LEFT AND RIGHT HEART CATHETERIZATION WITH CORONARY ANGIOGRAM N/A 10/06/2013   Procedure: LEFT AND RIGHT HEART CATHETERIZATION WITH CORONARY ANGIOGRAM;  Surgeon: Jolaine Artist, MD;  Location: [facility] CATH LAB;  Service: Cardiovascular;  Laterality: N/A;  . LITHOTRIPSY     h/o several procedures   . MASTECTOMY     B  . OOPHORECTOMY     B, per genticists advise at [place]  . VENOGRAM N/A 10/20/2012   Procedure: VENOGRAM;  Surgeon: Deboraha Sprang, MD;  Location: [facility] CATH LAB;  Service: Cardiovascular;  Laterality: N/A;    Current Outpatient Prescriptions  Medication Sig Dispense Refill  . ascorbic acid (VITAMIN C) 1000 MG tablet Take 1,000 mg by mouth daily.    . Biotin 5000 MCG CAPS Take 1 capsule by mouth daily.    . carvedilol (COREG) 12.5 MG tablet TAKE 1 TABLET BY MOUTH TWICE DAILY WITH MEALS 60 tablet 6  . digoxin (DIGOX) 0.125 MG tablet Take 1 tablet (125 mcg total) by mouth daily. 30 tablet 6  . furosemide (LASIX) 40 MG tablet TAKE 2 TABLETS BY MOUTH EVERY MORNING AND 1 TABLET EVERY EVENING  90 tablet 5  . gabapentin (NEURONTIN) 300 MG capsule Take 300 mg by mouth daily as needed (shingles flare).    . multivitamin (THERAGRAN) per tablet Take 1 tablet by mouth daily.     . pantoprazole (PROTONIX) 40 MG tablet Take 1 tablet (40 mg total) by mouth daily. 90 tablet 3  . potassium chloride (KLOR-CON M10) 10 MEQ tablet TAKE 3 TABLETS (30 MEQ TOTAL) BY MOUTH DAILY. 90 tablet 6  . Probiotic Product (PROBIOTIC DAILY PO) Take 1 tablet by mouth daily.    . sacubitril-valsartan (ENTRESTO) 24-26 MG Take 1 tablet by mouth 2 (two) times daily. 60 tablet 5  . sertraline (ZOLOFT) 25 MG tablet Take  1 tablet (25 mg total) by mouth daily. 30 tablet 5  . spironolactone (ALDACTONE) 25 MG tablet TAKE 1 TABLET BY MOUTH DAILY. 90 tablet 3  . vitamin B-12 (CYANOCOBALAMIN) 1000 MCG tablet Take 1,000 mcg by mouth daily.    Marland Kitchen zolpidem (AMBIEN) 10 MG tablet Take 1 tablet (10 mg total) by mouth at bedtime as needed for sleep. 90 tablet 1   No current facility-administered medications for this visit.     Allergies  Allergen Reactions  . Atacand [Candesartan] Other (See Comments)    Fatigue   . Cephalexin Other (See Comments)    REACTION: itching, hives  . Latex Rash    Review of Systems negative except from HPI and PMH  Physical Exam BP 98/60   Pulse 89   Ht 5\' 4"  (1.626 m)   Wt 189 lb 12.8 oz (86.1 kg)   SpO2 94%   BMI 32.58 kg/m  Well developed and well nourished in no acute distress HENT normal E scleral and icterus clear Neck Supple JVP flat; carotids brisk and full Clear to ausculation Device pocket well healed; without hematoma or erythema.   No tethering   Regular rate and rhythm, 2/6 early systolic murmur Soft with active bowel sounds No clubbing cyanosis  Edema Alert and oriented, grossly normal motor and sensory function Skin Warm and Dry  ECG demonstrates P. synchronous biventricular pacing  Assessment and  Plan  Nonischemic cardiomyopathy/MR-severe  Congestive heart failure chronic systolic  Ventricular tachycardia  Implantable defibrillator-[employer]-CRT  The patient's device was interrogated.  The information was reviewed. No changes were made in the programming.      The patient has had intercurrent ventricular tachycardia that was misdiagnosed by the ICD. He fell below detection and was placed into the mode switch out of rhythm. We have reprogrammed the device to decrease detection rate  to 150. MADIT RIT algorithm was use with 60 seconds prior to the initiation of therapy. High-voltage therapies are off in this zone.  Euvolemic continue current  meds  There've been modest changes in pacing thresholds. We'll plan to see her again in 6 months to make sure that these stabilized.  I have reviewed with her echo findings.

## 2016-11-21 ENCOUNTER — Other Ambulatory Visit: Payer: Self-pay

## 2016-11-27 ENCOUNTER — Other Ambulatory Visit (HOSPITAL_COMMUNITY): Payer: Self-pay | Admitting: Internal Medicine

## 2016-11-27 ENCOUNTER — Other Ambulatory Visit: Payer: Self-pay | Admitting: *Deleted

## 2016-11-28 ENCOUNTER — Other Ambulatory Visit (HOSPITAL_COMMUNITY): Payer: Self-pay | Admitting: *Deleted

## 2016-11-28 MED ORDER — FUROSEMIDE 40 MG PO TABS: ORAL_TABLET | ORAL | 3 refills | Status: DC

## 2016-12-19 ENCOUNTER — Other Ambulatory Visit (HOSPITAL_COMMUNITY): Payer: Self-pay | Admitting: Internal Medicine

## 2017-01-07 ENCOUNTER — Ambulatory Visit: Payer: Medicare Other | Admitting: Internal Medicine

## 2017-01-07 ENCOUNTER — Telehealth: Payer: Self-pay | Admitting: Internal Medicine

## 2017-01-07 NOTE — Telephone Encounter (Signed)
Patient lvm at 8:18am cancelling 10:45am today due to family emergency, charge or no charge

## 2017-01-07 NOTE — Telephone Encounter (Signed)
No, thx 

## 2017-01-08 ENCOUNTER — Ambulatory Visit (INDEPENDENT_AMBULATORY_CARE_PROVIDER_SITE_OTHER): Payer: Medicare Other | Admitting: *Deleted

## 2017-01-08 DIAGNOSIS — I428 Other cardiomyopathies: Secondary | ICD-10-CM

## 2017-01-08 NOTE — Progress Notes (Signed)
Remote ICD transmission.   

## 2017-01-11 ENCOUNTER — Other Ambulatory Visit (HOSPITAL_COMMUNITY): Payer: Self-pay | Admitting: Internal Medicine

## 2017-01-11 ENCOUNTER — Other Ambulatory Visit: Payer: Self-pay | Admitting: Internal Medicine

## 2017-01-13 ENCOUNTER — Encounter: Payer: Self-pay | Admitting: Cardiology

## 2017-01-16 LAB — CUP PACEART REMOTE DEVICE CHECK
Battery Remaining Longevity: 66 mo
Battery Remaining Percentage: 100 %
Brady Statistic RV Percent Paced: 100 %
HIGH POWER IMPEDANCE MEASURED VALUE: 59 Ohm
Implantable Lead Implant Date: 20050701
Implantable Lead Location: 753858
Implantable Lead Model: 158
Implantable Lead Model: 5076
Implantable Lead Serial Number: 156416
Lead Channel Impedance Value: 443 Ohm
Lead Channel Pacing Threshold Amplitude: 0.9 V
Lead Channel Pacing Threshold Amplitude: 1.7 V
Lead Channel Pacing Threshold Pulse Width: 0.4 ms
Lead Channel Pacing Threshold Pulse Width: 0.4 ms
Lead Channel Pacing Threshold Pulse Width: 0.6 ms
Lead Channel Setting Pacing Amplitude: 2 V
Lead Channel Setting Pacing Amplitude: 2.4 V
Lead Channel Setting Pacing Pulse Width: 0.6 ms
MDC IDC LEAD IMPLANT DT: 20050701
MDC IDC LEAD IMPLANT DT: 20140314
MDC IDC LEAD LOCATION: 753859
MDC IDC LEAD LOCATION: 753860
MDC IDC MSMT LEADCHNL LV IMPEDANCE VALUE: 765 Ohm
MDC IDC MSMT LEADCHNL RA IMPEDANCE VALUE: 481 Ohm
MDC IDC MSMT LEADCHNL RV PACING THRESHOLD AMPLITUDE: 1.7 V
MDC IDC PG IMPLANT DT: 20140314
MDC IDC SESS DTM: 20180215061600
MDC IDC SET LEADCHNL LV PACING AMPLITUDE: 2.4 V
MDC IDC SET LEADCHNL LV SENSING SENSITIVITY: 1 mV
MDC IDC SET LEADCHNL RV PACING PULSEWIDTH: 0.6 ms
MDC IDC SET LEADCHNL RV SENSING SENSITIVITY: 0.5 mV
MDC IDC STAT BRADY RA PERCENT PACED: 0 %
Pulse Gen Serial Number: 111235

## 2017-01-23 ENCOUNTER — Ambulatory Visit (INDEPENDENT_AMBULATORY_CARE_PROVIDER_SITE_OTHER): Payer: Medicare Other | Admitting: Internal Medicine

## 2017-01-23 ENCOUNTER — Encounter: Payer: Self-pay | Admitting: Internal Medicine

## 2017-01-23 VITALS — BP 102/64 | HR 84 | Temp 98.2°F | Resp 14 | Ht 64.0 in | Wt 190.5 lb

## 2017-01-23 DIAGNOSIS — I5022 Chronic systolic (congestive) heart failure: Secondary | ICD-10-CM | POA: Diagnosis not present

## 2017-01-23 DIAGNOSIS — Z1159 Encounter for screening for other viral diseases: Secondary | ICD-10-CM

## 2017-01-23 DIAGNOSIS — F329 Major depressive disorder, single episode, unspecified: Secondary | ICD-10-CM | POA: Diagnosis not present

## 2017-01-23 DIAGNOSIS — F32A Depression, unspecified: Secondary | ICD-10-CM

## 2017-01-23 LAB — BASIC METABOLIC PANEL
BUN: 18 mg/dL (ref 7–25)
CHLORIDE: 104 mmol/L (ref 98–110)
CO2: 28 mmol/L (ref 20–31)
Calcium: 9.5 mg/dL (ref 8.6–10.4)
Creat: 0.91 mg/dL (ref 0.50–0.99)
GLUCOSE: 137 mg/dL — AB (ref 65–99)
POTASSIUM: 4.4 mmol/L (ref 3.5–5.3)
SODIUM: 140 mmol/L (ref 135–146)

## 2017-01-23 LAB — CBC WITH DIFFERENTIAL/PLATELET
BASOS ABS: 0 {cells}/uL (ref 0–200)
Basophils Relative: 0 %
Eosinophils Absolute: 284 cells/uL (ref 15–500)
Eosinophils Relative: 4 %
HEMATOCRIT: 40.3 % (ref 35.0–45.0)
HEMOGLOBIN: 13.4 g/dL (ref 11.7–15.5)
LYMPHS ABS: 2769 {cells}/uL (ref 850–3900)
Lymphocytes Relative: 39 %
MCH: 30.2 pg (ref 27.0–33.0)
MCHC: 33.3 g/dL (ref 32.0–36.0)
MCV: 91 fL (ref 80.0–100.0)
MONO ABS: 781 {cells}/uL (ref 200–950)
MPV: 9.1 fL (ref 7.5–12.5)
Monocytes Relative: 11 %
NEUTROS PCT: 46 %
Neutro Abs: 3266 cells/uL (ref 1500–7800)
Platelets: 233 10*3/uL (ref 140–400)
RBC: 4.43 MIL/uL (ref 3.80–5.10)
RDW: 13.3 % (ref 11.0–15.0)
WBC: 7.1 10*3/uL (ref 3.8–10.8)

## 2017-01-23 NOTE — Patient Instructions (Addendum)
GO TO THE LAB : Get the blood work     GO TO THE FRONT DESK Schedule your next appointment for a  Physical exam and a medicare wellness 6 months from now  Your weight loss, consider use MYFITNESSPAL

## 2017-01-23 NOTE — Progress Notes (Signed)
Pre visit review using our clinic review tool, if applicable. No additional management support is needed unless otherwise documented below in the visit note. 

## 2017-01-23 NOTE — Progress Notes (Signed)
Subjective:    Patient ID: [name], female    DOB: 1950/12/23, 66 y.o.   MRN: [id number]  DOS:  01/23/2017 Type of visit - description : rov Interval history: In general feeling well, good compliance w/ medication. Notes from cardiology reviewed. Sleeping well, uses Ambien as needed only.    Review of Systems Denies chest pain, difficulty breathing. No lower extremity edema Depression well-controlled on SSRIs  Past Medical History:  Diagnosis Date  . Breast CA ([place])    twice first on L breast 1982 w/ chest wall involvment, then a second breast ca on the R in 1987  . Chronic systolic CHF (congestive heart failure) ([place])    a. due to Adriamycin,s/p ICD;  b. 01/2013 Gen change and new LV lead - BSX Energen CRT-D BiV ICD, Ser # [id number]  . Depression   . Diverticulitis   . Early menopause    early 77s  . Fatigue 02/20/2016  . Glaucoma suspect of both eyes    Dr. Marshall Cork  . Nonischemic cardiomyopathy ([place])   . OSA (obstructive sleep apnea) 02/07/2016  . Osteopenia   . Renal calculus   . Snoring 02/20/2016    Past Surgical History:  Procedure Laterality Date  . BI-VENTRICULAR IMPLANTABLE CARDIOVERTER DEFIBRILLATOR UPGRADE N/A 02/04/2013   Procedure: BI-VENTRICULAR IMPLANTABLE CARDIOVERTER DEFIBRILLATOR UPGRADE;  Surgeon: Evans Lance, MD;  Location[facility] CATH LAB;  Service: Cardiovascular;  Laterality: N/A;  . CARDIAC DEFIBRILLATOR PLACEMENT     AICD Replaced-5/09 and 2014  . LEFT AND RIGHT HEART CATHETERIZATION WITH CORONARY ANGIOGRAM N/A 10/06/2013   Procedure: LEFT AND RIGHT HEART CATHETERIZATION WITH CORONARY ANGIOGRAM;  Surgeon: Jolaine Artist, MD;  Location: [facility] CATH LAB;  Service: Cardiovascular;  Laterality: N/A;  . LITHOTRIPSY     h/o several procedures   . MASTECTOMY     B  . OOPHORECTOMY     B, per genticists advise at [place]  . VENOGRAM N/A 10/20/2012   Procedure: VENOGRAM;  Surgeon: Deboraha Sprang, MD;  Location: [facility] CATH LAB;  Service:  Cardiovascular;  Laterality: N/A;    Social History   Social History  . Marital status: Married    Spouse name: N/A  . Number of children: 3  . Years of education: N/A   Occupational History  . 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 Smoking status: Never Smoker  . Smokeless tobacco: Never Used  . Alcohol use Yes     Comment: socially/occassionally  . Drug use: No  . Sexual activity: Not on file   Other Topics Concern  . Not on file   Social History Narrative   Related to Mr and Mrs Huston Foley, they are my patients as well    Married, 3 children all in [place], 7 Gkids            Allergies as of 01/23/2017      Reactions   Atacand [candesartan] Other (See Comments)   Fatigue   Cephalexin Other (See Comments)   REACTION: itching, hives   Latex Rash      Medication List       Accurate as of 01/23/17 11:59 PM. Always use your most recent med list.          ascorbic acid 1000 MG tablet Commonly known as:  VITAMIN C Take 1,000 mg by mouth daily.   Biotin 5000 MCG Caps Take 1 capsule by mouth daily.  carvedilol 12.5 MG tablet Commonly known as:  COREG TAKE 1 TABLET BY MOUTH TWICE DAILY WITH MEALS   digoxin 0.125 MG tablet Commonly known as:  LANOXIN TAKE 1 TABLET (125 MCG TOTAL) BY MOUTH DAILY.   ENTRESTO 24-26 MG Generic drug:  sacubitril-valsartan TAKE 1 TABLET BY MOUTH 2 (TWO) TIMES DAILY.   furosemide 40 MG tablet Commonly known as:  LASIX TAKE 2 TABLETS BY MOUTH EVERY MORNING AND 1 TABLET EVERY EVENING   gabapentin 300 MG capsule Commonly known as:  NEURONTIN Take 300 mg by mouth daily as needed (shingles flare).   multivitamin per tablet Take 1 tablet by mouth daily.   pantoprazole 40 MG tablet Commonly known as:  PROTONIX Take 1 tablet (40 mg total) by mouth daily.   potassium chloride 10 MEQ tablet Commonly known as:  K-DUR TAKE 3 TABLETS (30 MEQ TOTAL) BY MOUTH DAILY.   PROBIOTIC DAILY  PO Take 1 tablet by mouth daily.   sertraline 25 MG tablet Commonly known as:  ZOLOFT Take 1 tablet (25 mg total) by mouth daily.   spironolactone 25 MG tablet Commonly known as:  ALDACTONE TAKE 1 TABLET BY MOUTH DAILY.   vitamin B-12 1000 MCG tablet Commonly known as:  CYANOCOBALAMIN Take 1,000 mcg by mouth daily.   zolpidem 10 MG tablet Commonly known as:  AMBIEN Take 1 tablet (10 mg total) by mouth at bedtime as needed for sleep.          Objective:   Physical Exam BP 102/64 (BP Location: Left Arm, Patient Position: Sitting, Cuff Size: Normal)   Pulse 84   Temp 98.2 F (36.8 C) (Oral)   Resp 14   Ht 5\' 4"  (1.626 m)   Wt 190 lb 8 oz (86.4 kg)   SpO2 98%   BMI 32.70 kg/m  General:   Well developed, well nourished . NAD.  HEENT:  Normocephalic . Face symmetric, atraumatic Lungs:  CTA B Normal respiratory effort, no intercostal retractions, no accessory muscle use. Heart: RRR,  significant systolic murmur.  No pretibial edema bilaterally  Skin: Not pale. Not jaundice Neurologic:  alert & oriented X3.  Speech normal, gait appropriate for age and unassisted Psych--  Cognition and judgment appear intact.  Cooperative with normal attention span and concentration.  Behavior appropriate. No anxious or depressed appearing.      Assessment & Plan:  Assessment CV:Dr. Caryl Comes and [place] --CHF, nonischemic cardiomyopathy:due to chemotherapy --MR severe --VT Depression Mild OSA per sleep study 11-2015, saw Dr Radford Pax 02-20-16: no need for CPAP Osteopenia-- DEXA 06/13/2015 normal Vitamin D deficiency Kidney stones Menopause Breast cancer x 2: first in 1982 with chest wall involvement, 2nd on the R dx  1987, s/p B mastectomy Handicap sticker signed 06-2016 Post herpetic neuralgia (shingles 2014) , R face-- gaba prn  PLAN: CHF: Seems to be under excellent control, sees cards, on multiple medications, check a BMP and CBC Depression: Well-controlled, continue  sertraline. Post herpetic neuralgia: Still takes gabapentin on an as-needed basis. No persistent sxs Primary care: Check a hep C serology, reports had a flu shot at cardiology, no documentation. RTC 06-2017, CPX a Medicare wellness

## 2017-01-24 LAB — HEPATITIS C ANTIBODY: HCV AB: NEGATIVE

## 2017-01-25 NOTE — Assessment & Plan Note (Signed)
CHF: Seems to be under excellent control, sees cards, on multiple medications, check a BMP and CBC Depression: Well-controlled, continue sertraline. Post herpetic neuralgia: Still takes gabapentin on an as-needed basis. No persistent sxs Primary care: Check a hep C serology, reports had a flu shot at cardiology, no documentation. RTC 06-2017, CPX a Medicare wellness

## 2017-01-27 ENCOUNTER — Encounter: Payer: Self-pay | Admitting: Cardiology

## 2017-01-27 ENCOUNTER — Other Ambulatory Visit (HOSPITAL_COMMUNITY): Payer: Self-pay | Admitting: Internal Medicine

## 2017-02-05 DIAGNOSIS — H40013 Open angle with borderline findings, low risk, bilateral: Secondary | ICD-10-CM | POA: Diagnosis not present

## 2017-02-05 DIAGNOSIS — H04123 Dry eye syndrome of bilateral lacrimal glands: Secondary | ICD-10-CM | POA: Diagnosis not present

## 2017-02-11 ENCOUNTER — Other Ambulatory Visit (HOSPITAL_COMMUNITY): Payer: Self-pay | Admitting: Internal Medicine

## 2017-03-26 ENCOUNTER — Encounter (HOSPITAL_COMMUNITY): Payer: Medicare Other | Admitting: Internal Medicine

## 2017-03-27 ENCOUNTER — Ambulatory Visit (HOSPITAL_COMMUNITY)
Admission: RE | Admit: 2017-03-27 | Discharge: 2017-03-27 | Disposition: A | Discharge status: Home / Self Care | Payer: Medicare Other | Source: Ambulatory Visit | Attending: Internal Medicine | Admitting: Internal Medicine

## 2017-03-27 VITALS — BP 94/56 | HR 78 | Wt 188.4 lb

## 2017-03-27 DIAGNOSIS — M8580 Other specified disorders of bone density and structure, unspecified site: Secondary | ICD-10-CM | POA: Insufficient documentation

## 2017-03-27 DIAGNOSIS — I429 Cardiomyopathy, unspecified: Secondary | ICD-10-CM | POA: Insufficient documentation

## 2017-03-27 DIAGNOSIS — Z853 Personal history of malignant neoplasm of breast: Secondary | ICD-10-CM | POA: Diagnosis not present

## 2017-03-27 DIAGNOSIS — I5022 Chronic systolic (congestive) heart failure: Secondary | ICD-10-CM

## 2017-03-27 DIAGNOSIS — I359 Nonrheumatic aortic valve disorder, unspecified: Secondary | ICD-10-CM

## 2017-03-27 DIAGNOSIS — I428 Other cardiomyopathies: Secondary | ICD-10-CM | POA: Diagnosis not present

## 2017-03-27 DIAGNOSIS — H409 Unspecified glaucoma: Secondary | ICD-10-CM | POA: Insufficient documentation

## 2017-03-27 DIAGNOSIS — Z87442 Personal history of urinary calculi: Secondary | ICD-10-CM | POA: Insufficient documentation

## 2017-03-27 DIAGNOSIS — Z95810 Presence of automatic (implantable) cardiac defibrillator: Secondary | ICD-10-CM | POA: Diagnosis not present

## 2017-03-27 DIAGNOSIS — F329 Major depressive disorder, single episode, unspecified: Secondary | ICD-10-CM | POA: Diagnosis not present

## 2017-03-27 DIAGNOSIS — G4733 Obstructive sleep apnea (adult) (pediatric): Secondary | ICD-10-CM | POA: Diagnosis not present

## 2017-03-27 NOTE — Progress Notes (Signed)
Medication Samples have been provided to the patient.  Drug name: [name]       Strength: 24/26mg         Qty: 4  LOT: [id number]  Exp.Date: 11/18  Dosing instructions: 1 tab Twice daily   The patient has been instructed regarding the correct time, dose, and frequency of taking this medication, including desired effects and most common side effects.   Jayde Daffin 10:41 AM 03/27/2017

## 2017-03-27 NOTE — Patient Instructions (Signed)
We will contact you in 4 months to schedule your next appointment.  

## 2017-03-27 NOTE — Progress Notes (Signed)
ADVANCED HEART FAILURE CLINIC NOTE  Patient ID: [name], female   DOB: 1951-03-03, 66 y.o.   MRN: [id number] PCP: Dr Larose Kells EP: Dr Caryl Comes  Subjective [name] is a 66 y/o woman with h/o chronic systolic presumed due to chemotherapy-related cardiomyopathy diagnosed with breast cancer in 1982 in [place]. Treated with chemo (including adriamycin)/XRT and L mastectomy. Had R breast cancer (unrelated) in 1987. Has been cancer free since. Denies HTN, HL, DM2.  Was first diagnosed with HF in 1988. Had left heart cath she thinks in 2009. 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 Echo 2009 EF 20% with moderate AI and mild to moderate MR. Had [place] ICD placed and then LV lead became nonfunctional. In 3/14, underwent CRT revision.   Previously on coumadin due to cardiomyopathy but discontinued. Had sleep study 1/17 very mild OSA (6.2/hr) not using CPAP.   She is here for routine f/u. Overall fairly stable. CPX 10/7 stable pVO2 16 slope 34.  Able to do ADLs without problem. Gets SOB if she tries to hurry. No edema, orthopnea or PND. Occasional dizziness. Tolerating [name] but is worried about the donut hole   ECHO 09/2013 with Dr. Wynonia Lawman and EF 10-15% with significant dyssynchrony. Subsequently had LV lead checked and was functioning well.  ECHO 7/15 EF 10-15%, moderate to severe RV dysfunction ECHO 4/16 EF 15% normal RV moderate AI ECHO 9/17 EF 20-25% RV ok. Severe MR mild AI/AS  CPX 10/17  FVC 3.44 (113%)    FEV1 2.32 (97%)     FEV1/FVC 67 (86%)     MVV 90 (101%)    Resting HR: 72 Peak HR: 112 (73% age predicted max HR)  BP rest: 104/64 BP peak: 146/64 Peak VO2: 16.0 (91% predicted peak VO2) VE/VCO2 slope: 34.6 OUES: 1.90 Peak RER: 1.10 Ventilatory Threshold: 13.5 (76.9% predicted or measured peak VO2) VE/MVV: 64% PETCO2 at peak: 29 O2pulse: 13  (130% predicted O2pulse)  LHC/RHC 10/06/13  RA = 11  RV = 54/9/13  PA = 53/27 (38)   PCW = 23  Fick cardiac output/index = 3.8/2.0  Thermo CO/CI = 3.4/1.8  PVR = 3.9 WU (Fick)  FA sat = 98%  PA sat = 62%, 64%  SVC = 59%  Coronaries no significant disease but there was a fistula from small D1 => PA.   CPX 10/25/13  Peak VO2: 15.6 (81.2% predicted peak VO2) VE/VCO2 slope: 36.9 OUES: 1.25 Peak RER: 1.08  CPX 05/24/14 FVC 3.05 (92%)  FEV1 2.23 (88%)  FEV1/FVC 73%  MVV 92 (100%) Resting HR: 64 Peak HR: 132 (85% age predicted max HR) BP rest: 108/70 BP peak: 130/66  Peak VO2: 16.4 (81.1% predicted peak VO2) VE/VCO2 slope: 33.6 OUES: 1.74 Peak RER: 1.16 Ventilatory Threshold: 12.5 (61.8% predicted peak VO2) VE/MVV: 62% PETCO2 at peak: 31 O2pulse: 12 (109% predicted O2pulse)    Lab 10/06/13 K 2.8 Creatinine 0.83 Labs 10/31/13 K 3.0 Creatinine 0.68  Dig level 0.9 Pro BNP 3906  Labs 11/11/13  K 3.6 Creatinine 0.8 Labs 12/16/13 K 3.8 Creatinine 0.8  Labs 4/15 K 4, creatinine 0.8, LDL 128, TSH normal, digoxin 0.6 Labs 6/15 K 4.1 Cr 0.8 digoxin 0.7 Labs 8/15 K 4.2, creatinine 0.78, pBNP 2126 Labs 8/15 K 4.2, creatinine 1.0 dig <0.5  ECG: NSR, BiV paced  Blood type A+   FHX: Had older brother with HF in his 80s. No strong FHx of HF.   SocHx: Retired. Non-smoker. Rare ETOH. 3 grown kids.  Past Medical History:  Diagnosis Date  . Breast CA ([place])    twice first on L breast 1982 w/ chest wall involvment, then a second breast ca on the R in 1987  . Chronic systolic CHF (congestive heart failure) ([place])    a. due to Adriamycin,s/p ICD;  b. 01/2013 Gen change and new LV lead - BSX Energen CRT-D BiV ICD, Ser # [id number]  . Depression   . Diverticulitis   . Early menopause    early 45s  . Fatigue 02/20/2016  . Glaucoma suspect of both eyes    Dr. Marshall Cork  . Nonischemic cardiomyopathy ([place])   . OSA (obstructive sleep apnea) 02/07/2016  . Osteopenia   . Renal calculus   . Snoring 02/20/2016    Past Surgical History:  Procedure Laterality Date  .  BI-VENTRICULAR IMPLANTABLE CARDIOVERTER DEFIBRILLATOR UPGRADE N/A 02/04/2013   Procedure: BI-VENTRICULAR IMPLANTABLE CARDIOVERTER DEFIBRILLATOR UPGRADE;  Surgeon: Evans Lance, MD;  Location: [facility] CATH LAB;  Service: Cardiovascular;  Laterality: N/A;  . CARDIAC DEFIBRILLATOR PLACEMENT     AICD Replaced-5/09 and 2014  . LEFT AND RIGHT HEART CATHETERIZATION WITH CORONARY ANGIOGRAM N/A 10/06/2013   Procedure: LEFT AND RIGHT HEART CATHETERIZATION WITH CORONARY ANGIOGRAM;  Surgeon: Jolaine Artist, MD;  Location: [facility] CATH LAB;  Service: Cardiovascular;  Laterality: N/A;  . LITHOTRIPSY     h/o several procedures   . MASTECTOMY     B  . OOPHORECTOMY     B, per genticists advise at [place]  . VENOGRAM N/A 10/20/2012   Procedure: VENOGRAM;  Surgeon: Deboraha Sprang, MD;  Location: [facility] CATH LAB;  Service: Cardiovascular;  Laterality: N/A;    Current Outpatient Prescriptions  Medication Sig Dispense Refill  . Biotin 5000 MCG CAPS Take 1 capsule by mouth daily.    . carvedilol (COREG) 12.5 MG tablet TAKE 1 TABLET BY MOUTH TWICE DAILY WITH MEALS 60 tablet 8  . digoxin (LANOXIN) 0.125 MG tablet TAKE 1 TABLET (125 MCG TOTAL) BY MOUTH DAILY. 30 tablet 6  . ENTRESTO 24-26 MG TAKE 1 TABLET BY MOUTH 2 (TWO) TIMES DAILY. 60 tablet 5  . furosemide (LASIX) 40 MG tablet TAKE 2 TABLETS BY MOUTH EVERY MORNING AND 1 TABLET EVERY EVENING 90 tablet 3  . gabapentin (NEURONTIN) 300 MG capsule Take 300 mg by mouth daily as needed (shingles flare).    . multivitamin (THERAGRAN) per tablet Take 1 tablet by mouth daily.     . pantoprazole (PROTONIX) 40 MG tablet Take 1 tablet (40 mg total) by mouth daily. 90 tablet 3  . potassium chloride (K-DUR) 10 MEQ tablet TAKE 3 TABLETS (30 MEQ TOTAL) BY MOUTH DAILY. 90 tablet 5  . Probiotic Product (PROBIOTIC DAILY PO) Take 1 tablet by mouth daily.    . sertraline (ZOLOFT) 25 MG tablet Take 1 tablet (25 mg total) by mouth daily. 30 tablet 1  . spironolactone (ALDACTONE) 25 MG  tablet TAKE 1 TABLET BY MOUTH DAILY. 90 tablet 2  . vitamin B-12 (CYANOCOBALAMIN) 1000 MCG tablet Take 1,000 mcg by mouth daily.    Marland Kitchen zolpidem (AMBIEN) 10 MG tablet Take 1 tablet (10 mg total) by mouth at bedtime as needed for sleep. 90 tablet 1  . ascorbic acid (VITAMIN C) 1000 MG tablet Take 1,000 mg by mouth daily.     No current facility-administered medications for this encounter.     Allergies  Allergen Reactions  . Atacand [Candesartan] Other (See Comments)    Fatigue   .  Cephalexin Other (See Comments)    REACTION: itching, hives  . Latex Rash    Review of Systems negative except from HPI and PMH  Physical Exam BP (!) 94/56   Pulse 78   Wt 188 lb 6 oz (85.4 kg)   SpO2 97%   BMI 32.33 kg/m  General:  Well appearing. No resp difficulty HEENT: normal Neck: supple. no JVD. Carotids 2+ bilat; no bruits. No lymphadenopathy or thryomegaly appreciated. Cor: PMI laterally displaced. Regular rate & rhythm. Soft AI murmur Lungs: clear Abdomen: soft, nontender, nondistended. No hepatosplenomegaly. No bruits or masses. Good bowel sounds. Extremities: no cyanosis, clubbing, rash, edema Neuro: alert & orientedx3, cranial nerves grossly intact. moves all 4 extremities w/o difficulty. Affect pleasant   Assessment and  Plan  1) Chronic systolic HF: Nonischemic cardiomyopathy, possibly anthracycline cardiotoxicity.  LHC 11/14 without significant coronary disease.    Has [employer] CRT-D. ECHO 09/2013 EF 10-15%. Echo at [facility] 4/16 EF 15% moderate AI. Echo 9/17 EF 20-25%.  RV ok. Severe MR mild AI/AS  Seen in [facility] and will follow prn - NYHA III, stable. Volume status ok  - CPX from 10/17 reviewed with her. Stable with moderate (to severe HF limitation). Will continue to follow very closely. Return in 4 months with echo. Call sooner if not doing wel.  - Continue Entresto, Coreg, digoxin, spironolactone at current doses. BP too low to titrate Entresto.  - Corlanor  may be an option down the road if HR remains > 70.   - Will have her apply for [employer] to help afford Entresto  - Has previously seen Dr. Stann Mainland at [facility] and will refer back as needed. (Blood type A positive) - Recent BMET ok.  2) h/o bilateral breast CA in 1982 and 1987 (treated with Adriamycin in 1982) 3) ICD - follows with Dr. Rhunette Croft MD 03/27/2017

## 2017-03-30 ENCOUNTER — Telehealth (HOSPITAL_COMMUNITY): Payer: Self-pay | Admitting: Pharmacist

## 2017-03-30 NOTE — Telephone Encounter (Signed)
[name] was concerned about the fact that the cost of her [name] may put her into the "donut hole". I have left her a VM to call me back to discuss this. Getting her [name] through her insurance will push her toward the donut hole but if this happens, the [name] foundation will cover her copays for her other HF medications so she would really only be responsible for the cost of her sertraline.   [name], PharmD, BCPS, CPP Clinical Pharmacist Pager: [phone] Phone: [phone] 03/30/2017 12:56 PM

## 2017-04-10 ENCOUNTER — Ambulatory Visit (INDEPENDENT_AMBULATORY_CARE_PROVIDER_SITE_OTHER): Payer: Medicare Other | Admitting: *Deleted

## 2017-04-10 ENCOUNTER — Ambulatory Visit (INDEPENDENT_AMBULATORY_CARE_PROVIDER_SITE_OTHER): Payer: Medicare Other | Admitting: Internal Medicine

## 2017-04-10 VITALS — BP 96/62 | HR 86 | Temp 97.9°F | Ht 64.0 in | Wt 189.0 lb

## 2017-04-10 DIAGNOSIS — L089 Local infection of the skin and subcutaneous tissue, unspecified: Secondary | ICD-10-CM | POA: Diagnosis not present

## 2017-04-10 DIAGNOSIS — I428 Other cardiomyopathies: Secondary | ICD-10-CM

## 2017-04-10 DIAGNOSIS — L723 Sebaceous cyst: Secondary | ICD-10-CM | POA: Diagnosis not present

## 2017-04-10 MED ORDER — DOXYCYCLINE HYCLATE 100 MG PO TABS: 100.0000 mg | ORAL_TABLET | Freq: Two times a day (BID) | ORAL | 0 refills | Status: DC

## 2017-04-10 NOTE — Patient Instructions (Signed)
Warm compress  Doxycycline for 1 week  Tylenol for pain  Come back next week if area is not better, may  need to lancet it  Call, or go to the urgent care if the cyst swelling, redness increases

## 2017-04-10 NOTE — Progress Notes (Signed)
Subjective:    Patient ID: [name], female    DOB: Jan 13, 1951, 66 y.o.   MRN: [id number]  DOS:  04/10/2017 Type of visit - description : Acute Interval history:  4 days ago noted a knot at the right inner leg, the area is painful. Slightly red but not hot. Has not noted  discharge Slightly increased in size since she found it.   Review of Systems No fever or chills   Past Medical History:  Diagnosis Date  . Breast CA ([place])    twice first on L breast 1982 w/ chest wall involvment, then a second breast ca on the R in 1987  . Chronic systolic CHF (congestive heart failure) ([place])    a. due to Adriamycin,s/p ICD;  b. 01/2013 Gen change and new LV lead - BSX Energen CRT-D BiV ICD, Ser # [id number]  . Depression   . Diverticulitis   . Early menopause    early 7s  . Fatigue 02/20/2016  . Glaucoma suspect of both eyes    Dr. Marshall Cork  . Nonischemic cardiomyopathy ([place])   . OSA (obstructive sleep apnea) 02/07/2016  . Osteopenia   . Renal calculus   . Snoring 02/20/2016    Past Surgical History:  Procedure Laterality Date  . BI-VENTRICULAR IMPLANTABLE CARDIOVERTER DEFIBRILLATOR UPGRADE N/A 02/04/2013   Procedure: BI-VENTRICULAR IMPLANTABLE CARDIOVERTER DEFIBRILLATOR UPGRADE;  Surgeon: Evans Lance, MD;  Location: [facility] CATH LAB;  Service: Cardiovascular;  Laterality: N/A;  . CARDIAC DEFIBRILLATOR PLACEMENT     AICD Replaced-5/09 and 2014  . LEFT AND RIGHT HEART CATHETERIZATION WITH CORONARY ANGIOGRAM N/A 10/06/2013   Procedure: LEFT AND RIGHT HEART CATHETERIZATION WITH CORONARY ANGIOGRAM;  Surgeon: Jolaine Artist, MD;  Location: [facility] CATH LAB;  Service: Cardiovascular;  Laterality: N/A;  . LITHOTRIPSY     h/o several procedures   . MASTECTOMY     B  . OOPHORECTOMY     B, per genticists advise at [place]  . VENOGRAM N/A 10/20/2012   Procedure: VENOGRAM;  Surgeon: Deboraha Sprang, MD;  Location: [facility] CATH LAB;  Service: Cardiovascular;  Laterality: N/A;     Social History   Social History  . Marital status: Married    Spouse name: N/A  . Number of children: 3  . Years of education: N/A   Occupational History  . 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 Smoking status: Never Smoker  . Smokeless tobacco: Never Used  . Alcohol use Yes     Comment: socially/occassionally  . Drug use: No  . Sexual activity: Not on file   Other Topics Concern  . Not on file   Social History Narrative   Related to Mr and Mrs Huston Foley, they are my patients as well    Married, 3 children all in [place], 7 Gkids            Allergies as of 04/10/2017      Reactions   Atacand [candesartan] Other (See Comments)   Fatigue   Cephalexin Other (See Comments)   REACTION: itching, hives   Latex Rash      Medication List       Accurate as of 04/10/17 11:59 PM. Always use your most recent med list.          ascorbic acid 1000 MG tablet Commonly known as:  VITAMIN C Take 1,000 mg by mouth daily.   Biotin 5000 MCG Caps Take  1 capsule by mouth daily.   carvedilol 12.5 MG tablet Commonly known as:  COREG TAKE 1 TABLET BY MOUTH TWICE DAILY WITH MEALS   digoxin 0.125 MG tablet Commonly known as:  LANOXIN TAKE 1 TABLET (125 MCG TOTAL) BY MOUTH DAILY.   doxycycline 100 MG tablet Commonly known as:  VIBRA-TABS Take 1 tablet (100 mg total) by mouth 2 (two) times daily.   ENTRESTO 24-26 MG Generic drug:  sacubitril-valsartan TAKE 1 TABLET BY MOUTH 2 (TWO) TIMES DAILY.   furosemide 40 MG tablet Commonly known as:  LASIX TAKE 2 TABLETS BY MOUTH EVERY MORNING AND 1 TABLET EVERY EVENING   gabapentin 300 MG capsule Commonly known as:  NEURONTIN Take 300 mg by mouth daily as needed (shingles flare).   multivitamin per tablet Take 1 tablet by mouth daily.   pantoprazole 40 MG tablet Commonly known as:  PROTONIX Take 1 tablet (40 mg total) by mouth daily.   potassium chloride 10 MEQ  tablet Commonly known as:  K-DUR TAKE 3 TABLETS (30 MEQ TOTAL) BY MOUTH DAILY.   PROBIOTIC DAILY PO Take 1 tablet by mouth daily.   sertraline 25 MG tablet Commonly known as:  ZOLOFT Take 1 tablet (25 mg total) by mouth daily.   spironolactone 25 MG tablet Commonly known as:  ALDACTONE TAKE 1 TABLET BY MOUTH DAILY.   vitamin B-12 1000 MCG tablet Commonly known as:  CYANOCOBALAMIN Take 1,000 mcg by mouth daily.   zolpidem 10 MG tablet Commonly known as:  AMBIEN Take 1 tablet (10 mg total) by mouth at bedtime as needed for sleep.          Objective:   Physical Exam  Genitourinary:      BP 96/62   Pulse 86   Temp 97.9 F (36.6 C) (Oral)   Ht 5\' 4"  (1.626 m)   Wt 189 lb (85.7 kg)   SpO2 97%   BMI 32.44 kg/m  General:   Well developed, well nourished . NAD.  HEENT:  Normocephalic . Face symmetric, atraumatic Skin: Not pale. Not jaundice Neurologic:  alert & oriented X3.  Speech normal, gait appropriate for age and unassisted Psych--  Cognition and judgment appear intact.  Cooperative with normal attention span and concentration.  Behavior appropriate. No anxious or depressed appearing.      Assessment & Plan:    Assessment CV:Dr. Caryl Comes and Bensimohn --CHF, nonischemic cardiomyopathy:due to chemotherapy --MR severe --VT Depression Mild OSA per sleep study 11-2015, saw Dr Radford Pax 02-20-16: no need for CPAP Osteopenia-- DEXA 06/13/2015 normal Vitamin D deficiency Kidney stones Menopause Breast cancer x 2: first in 1982 with chest wall involvement, 2nd on the R dx  1987, s/p B mastectomy Handicap sticker signed 06-2016 Post herpetic neuralgia (shingles 2014) , R face-- gaba prn  PLAN: Sebaceous cyst: Presentation consistent with sebaceous cyst , probably infected, no fluctuance, will treat medically with doxycycline, if not better will come back next week for possible I&D, come back anytime if worse.

## 2017-04-11 NOTE — Assessment & Plan Note (Addendum)
Sebaceous cyst: Presentation consistent with sebaceous cyst , probably infected, no fluctuance, will treat medically with doxycycline, if not better will come back next week for possible I&D, come back anytime if worse.

## 2017-04-15 ENCOUNTER — Encounter: Payer: Self-pay | Admitting: Cardiology

## 2017-04-16 LAB — CUP PACEART REMOTE DEVICE CHECK
Battery Remaining Longevity: 66 mo
Battery Remaining Percentage: 98 %
Brady Statistic RV Percent Paced: 100 %
Date Time Interrogation Session: 20180518041200
HIGH POWER IMPEDANCE MEASURED VALUE: 60 Ohm
Implantable Lead Implant Date: 20050701
Implantable Lead Implant Date: 20140314
Implantable Lead Location: 753859
Implantable Lead Model: 4196
Implantable Lead Model: 5076
Implantable Lead Serial Number: 156416
Implantable Pulse Generator Implant Date: 20140314
Lead Channel Impedance Value: 448 Ohm
Lead Channel Impedance Value: 734 Ohm
Lead Channel Pacing Threshold Amplitude: 0.9 V
Lead Channel Pacing Threshold Amplitude: 1.7 V
Lead Channel Pacing Threshold Amplitude: 1.7 V
Lead Channel Pacing Threshold Pulse Width: 0.4 ms
Lead Channel Pacing Threshold Pulse Width: 0.4 ms
Lead Channel Setting Pacing Amplitude: 2 V
Lead Channel Setting Pacing Amplitude: 2.4 V
Lead Channel Setting Pacing Pulse Width: 0.6 ms
Lead Channel Setting Pacing Pulse Width: 0.6 ms
Lead Channel Setting Sensing Sensitivity: 0.5 mV
MDC IDC LEAD IMPLANT DT: 20050701
MDC IDC LEAD LOCATION: 753858
MDC IDC LEAD LOCATION: 753860
MDC IDC MSMT LEADCHNL LV PACING THRESHOLD PULSEWIDTH: 0.6 ms
MDC IDC MSMT LEADCHNL RA IMPEDANCE VALUE: 472 Ohm
MDC IDC PG SERIAL: 111235
MDC IDC SET LEADCHNL LV PACING AMPLITUDE: 2.4 V
MDC IDC SET LEADCHNL LV SENSING SENSITIVITY: 1 mV
MDC IDC STAT BRADY RA PERCENT PACED: 0 %

## 2017-04-22 ENCOUNTER — Telehealth (HOSPITAL_COMMUNITY): Payer: Self-pay | Admitting: *Deleted

## 2017-04-22 NOTE — Telephone Encounter (Signed)
Faxed last office visit with vitals to [facility] as requested.

## 2017-05-07 ENCOUNTER — Ambulatory Visit (INDEPENDENT_AMBULATORY_CARE_PROVIDER_SITE_OTHER): Payer: Medicare Other | Admitting: Internal Medicine

## 2017-05-07 ENCOUNTER — Encounter: Payer: Self-pay | Admitting: Internal Medicine

## 2017-05-07 VITALS — BP 96/60 | HR 89 | Ht 64.0 in | Wt 185.0 lb

## 2017-05-07 DIAGNOSIS — Z95810 Presence of automatic (implantable) cardiac defibrillator: Secondary | ICD-10-CM

## 2017-05-07 DIAGNOSIS — I428 Other cardiomyopathies: Secondary | ICD-10-CM

## 2017-05-07 DIAGNOSIS — I447 Left bundle-branch block, unspecified: Secondary | ICD-10-CM | POA: Diagnosis not present

## 2017-05-07 DIAGNOSIS — I5022 Chronic systolic (congestive) heart failure: Secondary | ICD-10-CM | POA: Diagnosis not present

## 2017-05-07 DIAGNOSIS — I4729 Other ventricular tachycardia: Secondary | ICD-10-CM

## 2017-05-07 DIAGNOSIS — I472 Ventricular tachycardia: Secondary | ICD-10-CM

## 2017-05-07 LAB — CUP PACEART INCLINIC DEVICE CHECK
Brady Statistic RA Percent Paced: 1 % — CL
Brady Statistic RV Percent Paced: 100 %
Date Time Interrogation Session: 20180614040000
HighPow Impedance: 32 Ohm
HighPow Impedance: 66 Ohm
Implantable Lead Implant Date: 20140314
Implantable Lead Location: 753859
Implantable Lead Location: 753860
Implantable Lead Model: 5076
Implantable Lead Serial Number: 156416
Lead Channel Impedance Value: 492 Ohm
Lead Channel Pacing Threshold Amplitude: 0.7 V
Lead Channel Pacing Threshold Amplitude: 1.5 V
Lead Channel Pacing Threshold Pulse Width: 0.6 ms
Lead Channel Sensing Intrinsic Amplitude: 12.4 mV
Lead Channel Sensing Intrinsic Amplitude: 3.4 mV
Lead Channel Setting Pacing Amplitude: 2 V
Lead Channel Setting Pacing Amplitude: 2.4 V
Lead Channel Setting Pacing Pulse Width: 0.6 ms
MDC IDC LEAD IMPLANT DT: 20050701
MDC IDC LEAD IMPLANT DT: 20050701
MDC IDC LEAD LOCATION: 753858
MDC IDC MSMT LEADCHNL LV IMPEDANCE VALUE: 871 Ohm
MDC IDC MSMT LEADCHNL RA PACING THRESHOLD PULSEWIDTH: 0.4 ms
MDC IDC MSMT LEADCHNL RV IMPEDANCE VALUE: 485 Ohm
MDC IDC MSMT LEADCHNL RV PACING THRESHOLD AMPLITUDE: 1.2 V
MDC IDC MSMT LEADCHNL RV PACING THRESHOLD PULSEWIDTH: 0.6 ms
MDC IDC MSMT LEADCHNL RV SENSING INTR AMPL: 25 mV — AB
MDC IDC PG IMPLANT DT: 20140314
MDC IDC SET LEADCHNL LV PACING AMPLITUDE: 2.4 V
MDC IDC SET LEADCHNL LV PACING PULSEWIDTH: 0.6 ms
MDC IDC SET LEADCHNL LV SENSING SENSITIVITY: 1 mV
MDC IDC SET LEADCHNL RV SENSING SENSITIVITY: 0.5 mV
Pulse Gen Serial Number: 111235

## 2017-05-07 NOTE — Patient Instructions (Addendum)
Medication Instructions:  Your physician recommends that you continue on your current medications as directed. Please refer to the Current Medication list given to you today.   Labwork: None ordered  Testing/Procedures: None ordered  Follow-Up: Remote monitoring is used to monitor your ICD from home. This monitoring reduces the number of office visits required to check your device to one time per year. It allows [place] to keep an eye on the functioning of your device to ensure it is working properly. You are scheduled for a device check from home on 07/13/17. You may send your transmission at any time that day. If you have a wireless device, the transmission will be sent automatically. After your physician reviews your transmission, you will receive a postcard with your next transmission date.    Your physician wants you to follow-up in: 3 months with Chanetta Marshall.   Your physician wants you to follow-up in: 12 months with Dr. Caryl Comes. You will receive a reminder letter in the mail two months in advance. If you don't receive a letter, please call our office to schedule the follow-up appointment.    Any Other Special Instructions Will Be Listed Below (If Applicable).     If you need a refill on your cardiac medications before your next appointment, please call your pharmacy.

## 2017-05-07 NOTE — Progress Notes (Signed)
After watching   seen    Patient Care Team: Colon Branch, MD as PCP - General Bensimhon, Shaune Pascal, MD as Consulting Physician (Cardiology) Hortencia Pilar, MD as Consulting Physician (Ophthalmology)   HPI  [name] is a 66 y.o. female seen in followup for congestive heart failure in the setting of chemotherapy associated cardiomyopathy with previously implanted CRT. She did part of the MADIT CRT protocol; her LV lead had failed and was capped.  When she began to develop worsening symptoms of heart failure in the fall and in the winter 2014  she underwent CRT upgrade.   She has been followed by Dr. Wynonia Lawman and the heart failure clinic.  Heart failure notes from 9/17 were reviewed. She remains relatively stable.  She has chronic fatigue. She does not sleep well. She has daytime somnolence.  She has no chest pain. She does have dyspnea with moderate exertion. She does not have orthopnea or peripheral edema.  She's also had recurrent episodes of presyncope. He's been associated with some palpitations.   ECHO 09/2013 with Dr. Wynonia Lawman and EF 10-15% with significant dyssynchrony. Subsequently had LV lead checked and was functioning well.  ECHO 7/15 EF 10-15%, moderate to severe RV dysfunction ECHO 4/16 EF 15% normal RV moderate AI Echo 9/17 EF 20-25% severe MR CPX was also reviewed. "No significant cardiac limitation despite severe LV dysfunction "  Heart failure records reviewed  Date Cr K Dig  3/18  0.91 4.4 0.7   Date LDL  8/17 118         Past Medical History:  Diagnosis Date  . Breast CA ([place])    twice first on L breast 1982 w/ chest wall involvment, then a second breast ca on the R in 1987  . Chronic systolic CHF (congestive heart failure) ([place])    a. due to Adriamycin,s/p ICD;  b. 01/2013 Gen change and new LV lead - BSX Energen CRT-D BiV ICD, Ser # [id number]  . Depression   . Diverticulitis   . Early menopause    early 39s  . Fatigue 02/20/2016  .  Glaucoma suspect of both eyes    Dr. Marshall Cork  . Nonischemic cardiomyopathy ([place])   . OSA (obstructive sleep apnea) 02/07/2016  . Osteopenia   . Renal calculus   . Snoring 02/20/2016    Past Surgical History:  Procedure Laterality Date  . BI-VENTRICULAR IMPLANTABLE CARDIOVERTER DEFIBRILLATOR UPGRADE N/A 02/04/2013   Procedure: BI-VENTRICULAR IMPLANTABLE CARDIOVERTER DEFIBRILLATOR UPGRADE;  Surgeon: Evans Lance, MD;  Location: [facility] CATH LAB;  Service: Cardiovascular;  Laterality: N/A;  . CARDIAC DEFIBRILLATOR PLACEMENT     AICD Replaced-5/09 and 2014  . LEFT AND RIGHT HEART CATHETERIZATION WITH CORONARY ANGIOGRAM N/A 10/06/2013   Procedure: LEFT AND RIGHT HEART CATHETERIZATION WITH CORONARY ANGIOGRAM;  Surgeon: Jolaine Artist, MD;  Location: [facility] CATH LAB;  Service: Cardiovascular;  Laterality: N/A;  . LITHOTRIPSY     h/o several procedures   . MASTECTOMY     B  . OOPHORECTOMY     B, per genticists advise at [facility]  . VENOGRAM N/A 10/20/2012   Procedure: VENOGRAM;  Surgeon: Deboraha Sprang, MD;  Location: [facility] CATH LAB;  Service: Cardiovascular;  Laterality: N/A;    Current Outpatient Prescriptions  Medication Sig Dispense Refill  . ascorbic acid (VITAMIN C) 1000 MG tablet Take 1,000 mg by mouth daily.    . Biotin 5000 MCG CAPS Take 1 capsule by mouth daily.    Marland Kitchen  carvedilol (COREG) 12.5 MG tablet TAKE 1 TABLET BY MOUTH TWICE DAILY WITH MEALS 60 tablet 8  . digoxin (LANOXIN) 0.125 MG tablet TAKE 1 TABLET (125 MCG TOTAL) BY MOUTH DAILY. 30 tablet 6  . doxycycline (VIBRA-TABS) 100 MG tablet Take 1 tablet (100 mg total) by mouth 2 (two) times daily. 15 tablet 0  . ENTRESTO 24-26 MG TAKE 1 TABLET BY MOUTH 2 (TWO) TIMES DAILY. 60 tablet 5  . furosemide (LASIX) 40 MG tablet TAKE 2 TABLETS BY MOUTH EVERY MORNING AND 1 TABLET EVERY EVENING 90 tablet 3  . gabapentin (NEURONTIN) 300 MG capsule Take 300 mg by mouth daily as needed (shingles flare).    . multivitamin (THERAGRAN)  per tablet Take 1 tablet by mouth daily.     . pantoprazole (PROTONIX) 40 MG tablet Take 1 tablet (40 mg total) by mouth daily. 90 tablet 3  . potassium chloride (K-DUR) 10 MEQ tablet TAKE 3 TABLETS (30 MEQ TOTAL) BY MOUTH DAILY. 90 tablet 5  . Probiotic Product (PROBIOTIC DAILY PO) Take 1 tablet by mouth daily.    . sertraline (ZOLOFT) 25 MG tablet Take 1 tablet (25 mg total) by mouth daily. 30 tablet 1  . spironolactone (ALDACTONE) 25 MG tablet TAKE 1 TABLET BY MOUTH DAILY. 90 tablet 2  . vitamin B-12 (CYANOCOBALAMIN) 1000 MCG tablet Take 1,000 mcg by mouth daily.    Marland Kitchen zolpidem (AMBIEN) 10 MG tablet Take 1 tablet (10 mg total) by mouth at bedtime as needed for sleep. 90 tablet 1   No current facility-administered medications for this visit.     Allergies  Allergen Reactions  . Atacand [Candesartan] Other (See Comments)    Fatigue   . Cephalexin Other (See Comments)    REACTION: itching, hives  . Latex Rash    Review of Systems negative except from HPI and PMH  Physical Exam BP 96/60   Pulse 89   Ht 5\' 4"  (1.626 m)   Wt 185 lb (83.9 kg)   SpO2 95%   BMI 31.76 kg/m  Well developed and nourished in no acute distress HENT normal Neck supple with JVP-6-8Clear Regular rate and rhythm, 2/6 M Abd-soft with active BS No Clubbing cyanosis edema Skin-warm and dry A & Oriented  Grossly normal sensory and motor function  ECG demonstrates P. synchronous biventricular pacing @ 89  Assessment and  Plan  Nonischemic cardiomyopathy/MR-severe  Congestive heart failure chronic systolic  Ventricular tachycardia-nonsustained  Hyperlipidemia  Implantable defibrillator-[employer]-CRT  The patient's device was interrogated.  The information was reviewed. No changes were made in the programming.     Euvolemic continue current meds  The episodes of presyncope associated with palpitations may be a consequence of the nonsustained ventricular tachycardia. She will try and keep a  calendar loss was to see if we can correlate her episodes of nonsustained VT. I'll have her come back to see A S in 3 months.  If there is symptomatic nonsustained VT, we will need to consider antiarrhythmic therapy.  She is not on statin therapy. We will have to explore antilipid therapy  We spent more than 50% of our >25 min visit in face to face counseling regarding the above

## 2017-05-12 ENCOUNTER — Other Ambulatory Visit (HOSPITAL_COMMUNITY): Payer: Self-pay | Admitting: Cardiology

## 2017-05-12 ENCOUNTER — Other Ambulatory Visit: Payer: Self-pay

## 2017-05-12 MED ORDER — POTASSIUM CHLORIDE ER 10 MEQ PO TBCR: EXTENDED_RELEASE_TABLET | ORAL | 3 refills | Status: DC

## 2017-05-12 MED ORDER — SPIRONOLACTONE 25 MG PO TABS: 25.0000 mg | ORAL_TABLET | Freq: Every day | ORAL | 3 refills | Status: DC

## 2017-05-12 MED ORDER — FUROSEMIDE 40 MG PO TABS: ORAL_TABLET | ORAL | 3 refills | Status: DC

## 2017-05-15 ENCOUNTER — Other Ambulatory Visit (HOSPITAL_COMMUNITY): Payer: Self-pay | Admitting: *Deleted

## 2017-05-15 MED ORDER — DIGOXIN 125 MCG PO TABS: 125.0000 ug | ORAL_TABLET | Freq: Every day | ORAL | 6 refills | Status: DC

## 2017-05-19 ENCOUNTER — Telehealth: Payer: Self-pay | Admitting: Internal Medicine

## 2017-05-19 MED ORDER — CARVEDILOL 12.5 MG PO TABS: 12.5000 mg | ORAL_TABLET | Freq: Two times a day (BID) | ORAL | 1 refills | Status: DC

## 2017-05-19 MED ORDER — PANTOPRAZOLE SODIUM 40 MG PO TBEC: 40.0000 mg | DELAYED_RELEASE_TABLET | Freq: Every day | ORAL | 1 refills | Status: DC

## 2017-05-19 MED ORDER — SERTRALINE HCL 25 MG PO TABS: 25.0000 mg | ORAL_TABLET | Freq: Every day | ORAL | 1 refills | Status: DC

## 2017-05-19 NOTE — Telephone Encounter (Signed)
Relation to [id number] Call back number:[phone] Pharmacy: [place], [place] [facility]  Reason for call:  Patient states she's about to run out of all her medications prescribe by Dr. Larose Kells and states [facility] faxed over several request, patient requesting all refills sent to [place], [place], please advise

## 2017-05-19 NOTE — Telephone Encounter (Signed)
Sertraline (Zoloft) 50mg , pantroprazole (Protonix) 40mg  tabs, and Carvedilol 12.5mg  sent to [facility] mail order. All other meds are either OTC, or prescribed by other physicians.

## 2017-05-20 ENCOUNTER — Emergency Department (HOSPITAL_COMMUNITY): Payer: Medicare Other

## 2017-05-20 ENCOUNTER — Observation Stay (HOSPITAL_COMMUNITY)
Admission: EM | Admit: 2017-05-20 | Discharge: 2017-05-21 | Disposition: A | Discharge status: Home / Self Care | Payer: Medicare Other | Attending: Internal Medicine | Admitting: Internal Medicine

## 2017-05-20 ENCOUNTER — Encounter (HOSPITAL_COMMUNITY): Payer: Self-pay | Admitting: Emergency Medicine

## 2017-05-20 DIAGNOSIS — R479 Unspecified speech disturbances: Secondary | ICD-10-CM | POA: Diagnosis not present

## 2017-05-20 DIAGNOSIS — R0683 Snoring: Secondary | ICD-10-CM | POA: Insufficient documentation

## 2017-05-20 DIAGNOSIS — M8580 Other specified disorders of bone density and structure, unspecified site: Secondary | ICD-10-CM | POA: Insufficient documentation

## 2017-05-20 DIAGNOSIS — Z853 Personal history of malignant neoplasm of breast: Secondary | ICD-10-CM | POA: Insufficient documentation

## 2017-05-20 DIAGNOSIS — Z8051 Family history of malignant neoplasm of kidney: Secondary | ICD-10-CM | POA: Diagnosis not present

## 2017-05-20 DIAGNOSIS — H409 Unspecified glaucoma: Secondary | ICD-10-CM | POA: Diagnosis not present

## 2017-05-20 DIAGNOSIS — I080 Rheumatic disorders of both mitral and aortic valves: Secondary | ICD-10-CM | POA: Insufficient documentation

## 2017-05-20 DIAGNOSIS — Z95810 Presence of automatic (implantable) cardiac defibrillator: Secondary | ICD-10-CM | POA: Insufficient documentation

## 2017-05-20 DIAGNOSIS — Z87442 Personal history of urinary calculi: Secondary | ICD-10-CM | POA: Insufficient documentation

## 2017-05-20 DIAGNOSIS — Z803 Family history of malignant neoplasm of breast: Secondary | ICD-10-CM | POA: Insufficient documentation

## 2017-05-20 DIAGNOSIS — R29700 NIHSS score 0: Secondary | ICD-10-CM | POA: Diagnosis not present

## 2017-05-20 DIAGNOSIS — I427 Cardiomyopathy due to drug and external agent: Secondary | ICD-10-CM | POA: Diagnosis not present

## 2017-05-20 DIAGNOSIS — I6789 Other cerebrovascular disease: Secondary | ICD-10-CM | POA: Diagnosis not present

## 2017-05-20 DIAGNOSIS — Z91040 Latex allergy status: Secondary | ICD-10-CM | POA: Insufficient documentation

## 2017-05-20 DIAGNOSIS — Z888 Allergy status to other drugs, medicaments and biological substances status: Secondary | ICD-10-CM | POA: Diagnosis not present

## 2017-05-20 DIAGNOSIS — I5022 Chronic systolic (congestive) heart failure: Secondary | ICD-10-CM | POA: Insufficient documentation

## 2017-05-20 DIAGNOSIS — I471 Supraventricular tachycardia: Secondary | ICD-10-CM | POA: Insufficient documentation

## 2017-05-20 DIAGNOSIS — G4733 Obstructive sleep apnea (adult) (pediatric): Secondary | ICD-10-CM | POA: Diagnosis not present

## 2017-05-20 DIAGNOSIS — G4700 Insomnia, unspecified: Secondary | ICD-10-CM | POA: Diagnosis not present

## 2017-05-20 DIAGNOSIS — G459 Transient cerebral ischemic attack, unspecified: Principal | ICD-10-CM | POA: Insufficient documentation

## 2017-05-20 DIAGNOSIS — R4781 Slurred speech: Secondary | ICD-10-CM | POA: Diagnosis not present

## 2017-05-20 DIAGNOSIS — F329 Major depressive disorder, single episode, unspecified: Secondary | ICD-10-CM | POA: Diagnosis not present

## 2017-05-20 DIAGNOSIS — G450 Vertebro-basilar artery syndrome: Secondary | ICD-10-CM

## 2017-05-20 DIAGNOSIS — I639 Cerebral infarction, unspecified: Secondary | ICD-10-CM

## 2017-05-20 DIAGNOSIS — F32A Depression, unspecified: Secondary | ICD-10-CM | POA: Diagnosis present

## 2017-05-20 LAB — I-STAT CHEM 8, ED
BUN: 18 mg/dL (ref 6–20)
Calcium, Ion: 1.14 mmol/L — ABNORMAL LOW (ref 1.15–1.40)
Chloride: 101 mmol/L (ref 101–111)
Creatinine, Ser: 1 mg/dL (ref 0.44–1.00)
Glucose, Bld: 123 mg/dL — ABNORMAL HIGH (ref 65–99)
HEMATOCRIT: 40 % (ref 36.0–46.0)
HEMOGLOBIN: 13.6 g/dL (ref 12.0–15.0)
POTASSIUM: 3.9 mmol/L (ref 3.5–5.1)
Sodium: 138 mmol/L (ref 135–145)
TCO2: 24 mmol/L (ref 0–100)

## 2017-05-20 LAB — CBC
HEMATOCRIT: 40.3 % (ref 36.0–46.0)
HEMOGLOBIN: 13.5 g/dL (ref 12.0–15.0)
MCH: 30.6 pg (ref 26.0–34.0)
MCHC: 33.5 g/dL (ref 30.0–36.0)
MCV: 91.4 fL (ref 78.0–100.0)
Platelets: 244 10*3/uL (ref 150–400)
RBC: 4.41 MIL/uL (ref 3.87–5.11)
RDW: 12.8 % (ref 11.5–15.5)
WBC: 10.4 10*3/uL (ref 4.0–10.5)

## 2017-05-20 LAB — COMPREHENSIVE METABOLIC PANEL
ALK PHOS: 69 U/L (ref 38–126)
ALT: 21 U/L (ref 14–54)
ANION GAP: 8 (ref 5–15)
AST: 23 U/L (ref 15–41)
Albumin: 3.9 g/dL (ref 3.5–5.0)
BILIRUBIN TOTAL: 0.5 mg/dL (ref 0.3–1.2)
BUN: 16 mg/dL (ref 6–20)
CALCIUM: 9.7 mg/dL (ref 8.9–10.3)
CO2: 23 mmol/L (ref 22–32)
CREATININE: 0.93 mg/dL (ref 0.44–1.00)
Chloride: 103 mmol/L (ref 101–111)
GFR calc non Af Amer: >60 mL/min (ref >60)
Glucose, Bld: 122 mg/dL — ABNORMAL HIGH (ref 65–99)
Potassium: 3.9 mmol/L (ref 3.5–5.1)
Sodium: 134 mmol/L — ABNORMAL LOW (ref 135–145)
TOTAL PROTEIN: 7 g/dL (ref 6.5–8.1)

## 2017-05-20 LAB — DIFFERENTIAL
Basophils Absolute: 0 10*3/uL (ref 0.0–0.1)
Basophils Relative: 0 %
EOS PCT: 3 %
Eosinophils Absolute: 0.3 10*3/uL (ref 0.0–0.7)
LYMPHS ABS: 3.5 10*3/uL (ref 0.7–4.0)
Lymphocytes Relative: 34 %
MONOS PCT: 9 %
Monocytes Absolute: 0.9 10*3/uL (ref 0.1–1.0)
NEUTROS ABS: 5.7 10*3/uL (ref 1.7–7.7)
Neutrophils Relative %: 54 %

## 2017-05-20 LAB — PROTIME-INR
INR: 1.06
Prothrombin Time: 13.8 seconds (ref 11.4–15.2)

## 2017-05-20 LAB — APTT: aPTT: 34 seconds (ref 24–36)

## 2017-05-20 LAB — DIGOXIN LEVEL: DIGOXIN LVL: 0.3 ng/mL — AB (ref 0.8–2.0)

## 2017-05-20 MED ORDER — IOPAMIDOL (ISOVUE-370) INJECTION 76%
INTRAVENOUS | Status: AC
  Administered 2017-05-20: 50 mL
  Filled 2017-05-20: qty 50

## 2017-05-20 NOTE — ED Triage Notes (Signed)
Per EMS, pt tried to reach for something w/ her right hand and found that she could not use it.  EMS called a code stroke.  Pt might have had some speech issues however they were not detected upon arrival.

## 2017-05-20 NOTE — ED Provider Notes (Signed)
[facility] DEPT Provider Note   CSN: [id number] Arrival date & time: 05/20/17  2046   An emergency department physician performed an initial assessment on this suspected stroke patient at 2041.  History   Chief Complaint Chief Complaint  Patient presents with  . Code Stroke    HPI [name] is a 66 y.o. female.  HPI Patient states she was feeling lightheaded this evening and went to lie down. She tried to reach for her glasses with her right hand and noticed that the right arm was weak. She also had some difficulty speaking. Symptoms started roughly at 1845 this evening. She states her weakness has resolved. Denied any visual changes any point. Complains of continued speech changes though this is improved. Past Medical History:  Diagnosis Date  . Breast CA ([place])    twice first on L breast 1982 w/ chest wall involvment, then a second breast ca on the R in 1987  . Chronic systolic CHF (congestive heart failure) ([place])    a. due to Adriamycin,s/p ICD;  b. 01/2013 Gen change and new LV lead - BSX Energen CRT-D BiV ICD, Ser # [id number]  . Depression   . Diverticulitis   . Early menopause    early 61s  . Fatigue 02/20/2016  . Glaucoma suspect of both eyes    Dr. Marshall Cork  . Nonischemic cardiomyopathy ([place])   . OSA (obstructive sleep apnea) 02/07/2016  . Osteopenia   . Renal calculus   . Snoring 02/20/2016    Patient Active Problem List   Diagnosis Date Noted  . Snoring 02/20/2016  . PCP NOTES >>>>>>>>>>>>>>>>>>> 02/07/2016  . Dysphagia 02/07/2016  . DOE (dyspnea on exertion) 07/03/2014  . LAD (lymphadenopathy) 10/07/2012  . Nonischemic cardiomyopathy ([place]) 09/13/2012  . Obesity (BMI 30-39.9)   . LBBB (left bundle branch block)   . Aortic valve disease   . Annual physical exam 08/17/2012  . Cough 05/10/2009  . Ventricular tachycardia   . History of diverticulosis and diverticulitis   . Depression-insomnia 04/06/2007  . Chronic systolic heart failure  ([place])   . History of breast cancer   . AICD -[employer]     Past Surgical History:  Procedure Laterality Date  . BI-VENTRICULAR IMPLANTABLE CARDIOVERTER DEFIBRILLATOR UPGRADE N/A 02/04/2013   Procedure: BI-VENTRICULAR IMPLANTABLE CARDIOVERTER DEFIBRILLATOR UPGRADE;  Surgeon: Evans Lance, MD;  Location: [facility] CATH LAB;  Service: Cardiovascular;  Laterality: N/A;  . CARDIAC DEFIBRILLATOR PLACEMENT     AICD Replaced-5/09 and 2014  . LEFT AND RIGHT HEART CATHETERIZATION WITH CORONARY ANGIOGRAM N/A 10/06/2013   Procedure: LEFT AND RIGHT HEART CATHETERIZATION WITH CORONARY ANGIOGRAM;  Surgeon: Jolaine Artist, MD;  Location: [facility] CATH LAB;  Service: Cardiovascular;  Laterality: N/A;  . LITHOTRIPSY     h/o several procedures   . MASTECTOMY     B  . OOPHORECTOMY     B, per genticists advise at [place]  . VENOGRAM N/A 10/20/2012   Procedure: VENOGRAM;  Surgeon: Deboraha Sprang, MD;  Location[facility] CATH LAB;  Service: Cardiovascular;  Laterality: N/A;    OB History    No data available       Home Medications    Prior to Admission medications   Medication Sig Start Date End Date Taking? Authorizing Provider  ascorbic acid (VITAMIN C) 1000 MG tablet Take 1,000 mg by mouth daily.   Yes [provider]  Biotin 5000 MCG CAPS Take 5,000 mcg by mouth daily.    Yes [provider]  carvedilol (COREG) 12.5 MG tablet Take 1 tablet (12.5 mg total) by mouth 2 (two) times daily with a meal. 05/19/17  Yes Paz, Alda Berthold, MD  digoxin (LANOXIN) 0.125 MG tablet Take 1 tablet (125 mcg total) by mouth daily. 05/15/17  Yes Bensimhon, Shaune Pascal, MD  ENTRESTO 24-26 MG TAKE 1 TABLET BY MOUTH 2 (TWO) TIMES DAILY. 11/28/16  Yes Bensimhon, Shaune Pascal, MD  furosemide (LASIX) 40 MG tablet TAKE 2 TABLETS BY MOUTH EVERY MORNING AND 1 TABLET EVERY EVENING Patient taking differently: Take 40-80 mg by mouth See admin instructions. 80 mg in the morning and 40 mg in the evening 05/12/17  Yes Larey Dresser, MD  multivitamin [facility]) per tablet Take 1 tablet by mouth daily.    Yes [provider]  pantoprazole (PROTONIX) 40 MG tablet Take 1 tablet (40 mg total) by mouth daily. 05/19/17  Yes Paz, Alda Berthold, MD  potassium chloride (K-DUR) 10 MEQ tablet TAKE 3 TABLETS (30 MEQ TOTAL) BY MOUTH DAILY. 05/12/17  Yes Larey Dresser, MD  Probiotic Product (PROBIOTIC DAILY PO) Take 1 capsule by mouth daily.    Yes [provider]  sertraline (ZOLOFT) 25 MG tablet Take 1 tablet (25 mg total) by mouth daily. 05/19/17  Yes Paz, Alda Berthold, MD  spironolactone (ALDACTONE) 25 MG tablet Take 1 tablet (25 mg total) by mouth daily. 05/12/17  Yes Larey Dresser, MD  vitamin B-12 (CYANOCOBALAMIN) 1000 MCG tablet Take 1,000 mcg by mouth daily.   Yes [provider]  zolpidem (AMBIEN) 10 MG tablet Take 1 tablet (10 mg total) by mouth at bedtime as needed for sleep. 09/16/16  Yes Paz, Alda Berthold, MD  doxycycline (VIBRA-TABS) 100 MG tablet Take 1 tablet (100 mg total) by mouth 2 (two) times daily. Patient not taking: Reported on 05/20/2017 04/10/17   Colon Branch, MD  gabapentin (NEURONTIN) 300 MG capsule Take 300 mg by mouth daily as needed (for shingles flares).     [provider]    Family History Family History  Problem Relation Age of Onset  . Kidney cancer Brother   . Breast cancer Mother        M and sister   . Heart attack Neg Hx   . Diabetes Neg Hx   . Colon cancer Neg Hx     Social History Social History  Substance Use Topics  . Smoking status: Never Smoker  . Smokeless tobacco: Never Used  . Alcohol use Yes     Comment: socially/occassionally     Allergies   Atacand [candesartan]; Cephalexin; and Latex   Review of Systems Review of Systems  Constitutional: Positive for diaphoresis. Negative for chills and fever.  HENT: Positive for voice change. Negative for congestion, ear pain, facial swelling, sinus pain, sinus pressure, sore throat and trouble swallowing.     Eyes: Negative for photophobia and visual disturbance.  Respiratory: Negative for cough and shortness of breath.   Cardiovascular: Negative for chest pain, palpitations and leg swelling.  Gastrointestinal: Negative for abdominal pain, constipation, diarrhea, nausea and vomiting.  Genitourinary: Negative for dysuria, flank pain and frequency.  Musculoskeletal: Negative for back pain, gait problem, myalgias, neck pain and neck stiffness.  Skin: Negative for rash and wound.  Neurological: Positive for speech difficulty, weakness and light-headedness. Negative for dizziness, numbness and headaches.     Physical Exam Updated Vital Signs BP 110/89 (BP Location: Left Arm)   Pulse 82   Temp 98.9 F (37.2 C) (  Oral)   Ht 5\' 4"  (1.626 m)   SpO2 98%   Physical Exam   ED Treatments / Results  Labs (all labs ordered are listed, but only abnormal results are displayed) Labs Reviewed  COMPREHENSIVE METABOLIC PANEL - Abnormal; Notable for the following:       Result Value   Sodium 134 (*)    Glucose, Bld 122 (*)    All other components within normal limits  I-STAT CHEM 8, ED - Abnormal; Notable for the following:    Glucose, Bld 123 (*)    Calcium, Ion 1.14 (*)    All other components within normal limits  PROTIME-INR  APTT  CBC  DIFFERENTIAL  I-STAT TROPOININ, ED  CBG MONITORING, ED    EKG  EKG Interpretation  Date/Time:  Wednesday May 20 2017 21:11:20 EDT Ventricular Rate:  79 PR Interval:    QRS Duration: 132 QT Interval:  421 QTC Calculation: 483 R Axis:   -76 Text Interpretation:  Sinus rhythm IVCD, consider atypical RBBB Anterolateral infarct, age indeterminate ST elevation, consider inferior injury Confirmed by Lita Mains  MD, Raequon Catanzaro ([id number]) on 05/20/2017 9:39:31 PM       Radiology Ct Head Code Stroke W/o Cm  Result Date: 05/20/2017 CLINICAL DATA:  Code stroke.  Slurred speech and facial droop EXAM: CT HEAD WITHOUT CONTRAST TECHNIQUE: Contiguous axial images were  obtained from the base of the skull through the vertex without intravenous contrast. COMPARISON:  Head CT 03/06/2010 FINDINGS: Brain: No mass lesion or acute hemorrhage. No focal hypoattenuation of the basal ganglia or cortex to indicate infarcted tissue. No hydrocephalus or age advanced atrophy. Vascular: No hyperdense vessel. No advanced atherosclerotic calcification of the arteries at the skull base. Skull: Normal visualized skull base, calvarium and extracranial soft tissues. Sinuses/Orbits: No sinus fluid levels or advanced mucosal thickening. No mastoid effusion. Normal orbits. ASPECTS [facility] Stroke Program Early CT Score) - Ganglionic level infarction (caudate, lentiform nuclei, internal capsule, insula, M1-M3 cortex): 7 - Supraganglionic infarction (M4-M6 cortex): 3 Total score (0-10 with 10 being normal): 10 IMPRESSION: 1. Normal head CT. 2. ASPECTS is 10. Dr.  Kerney Elbe was paged at 9:02 p.m. Electronically Signed   By: Ulyses Jarred M.D.   On: 05/20/2017 21:03    Procedures Procedures (including critical care time)  Medications Ordered in ED Medications  iopamidol (ISOVUE-370) 76 % injection (50 mLs  Contrast Given 05/20/17 2100)     Initial Impression / Assessment and Plan / ED Course  I have reviewed the triage vital signs and the nursing notes.  Pertinent labs & imaging results that were available during my care of the patient were reviewed by me and considered in my medical decision making (see chart for details).    Neurology evaluated patient. Head CT, CTA head and neck without acute abnormality. Code stroke was called off. Patient did have some recurrence of her right arm weakness which resolved spontaneously. Discussed with hospitalist and we'll see patient and admit.  Final Clinical Impressions(s) / ED Diagnoses   Final diagnoses:  Stroke (cerebrum) [facility])    New Prescriptions New Prescriptions   No medications on file     Julianne Rice, MD 05/22/17 1537

## 2017-05-20 NOTE — H&P (Signed)
History and Physical    [name] MVH:[id number] DOB: 03/18/1951 DOA: 05/20/2017  PCP: Colon Branch, MD   Patient coming from: Home.  I have personally briefly reviewed patient's old medical records in [place]  Chief Complaint: Right hand weakness.  HPI: [name] is a 66 y.o. female with medical history significant of breast cancer, depression, diverticulosis with an episode diverticulitis, early menopause, glaucoma, nonischemic cardiomyopathy, obstructive sleep apnea not on CPAP, osteopenia, urolithiasis who was brought to the emergency department via EMS after first becoming lightheaded, then she tried to reach for her glasses with her right hand and noticed that she was unable to use it. She called her husband for help. He noticed that the patient had a slurred speech. EMS called a code stroke on the way to the hospital. So far imaging with CT scan of the head, CT angiogram head and neck has been reassuring.  ED Course: Temperature 98.9, pulse 82, blood pressure 110/89 mmHg, respirations 12 and O2 sat 98% on room air. CBC and coagulation studies are within normal limits. Her CMP showed a mildly decreased sodium at 134 mmol/L and blood glucose of 122 mg/dL. Imaging has been reassuring. Please see cardiology pictures and full reports for further detail.  Review of Systems: As per HPI otherwise 10 point review of systems negative.    Past Medical History:  Diagnosis Date  . Breast CA ([place])    twice first on L breast 1982 w/ chest wall involvment, then a second breast ca on the R in 1987  . Chronic systolic CHF (congestive heart failure) ([place])    a. due to Adriamycin,s/p ICD;  b. 01/2013 Gen change and new LV lead - BSX Energen CRT-D BiV ICD, Ser # [id number]  . Depression   . Diverticulitis   . Early menopause    early 71s  . Fatigue 02/20/2016  . Glaucoma suspect of both eyes    Dr. Marshall Cork  . Nonischemic cardiomyopathy ([place])   . OSA (obstructive  sleep apnea) 02/07/2016  . Osteopenia   . Renal calculus   . Snoring 02/20/2016    Past Surgical History:  Procedure Laterality Date  . BI-VENTRICULAR IMPLANTABLE CARDIOVERTER DEFIBRILLATOR UPGRADE N/A 02/04/2013   Procedure: BI-VENTRICULAR IMPLANTABLE CARDIOVERTER DEFIBRILLATOR UPGRADE;  Surgeon: Evans Lance, MD;  Location[facility] C[facility] LAB;  Service: Cardiovascular;  Laterality: N/A;  . CARDIAC DEFIBRILLATOR PLACEMENT     AICD Replaced-5/09 and 2014  . LEFT AND RIGHT HEART CATHETERIZATION WITH CORONARY ANGIOGRAM N/A 10/06/2013   Procedure: LEFT AND RIGHT HEART CATHETERIZATION WITH CORONARY ANGIOGRAM;  Surgeon: Jolaine Artist, MD;  Location: [facility] CATH LAB;  Service: Cardiovascular;  Laterality: N/A;  . LITHOTRIPSY     h/o several procedures   . MASTECTOMY     B  . OOPHORECTOMY     B, per genticists advise at [place]  . VENOGRAM N/A 10/20/2012   Procedure: VENOGRAM;  Surgeon: Deboraha Sprang, MD;  Location: [facility] CATH LAB;  Service: Cardiovascular;  Laterality: N/A;     reports that she has never smoked. She has never used smokeless tobacco. She reports that she drinks alcohol. She reports that she does not use drugs.  Allergies  Allergen Reactions  . Atacand [Candesartan] Other (See Comments)    Causes fatigue   . Cephalexin Hives and Itching  . Latex Rash    Family History  Problem Relation Age of Onset  . Kidney cancer Brother   . Breast cancer Mother  M and sister   . Heart attack Neg Hx   . Diabetes Neg Hx   . Colon cancer Neg Hx     Prior to Admission medications   Medication Sig Start Date End Date Taking? Authorizing Provider  ascorbic acid (VITAMIN C) 1000 MG tablet Take 1,000 mg by mouth daily.   Yes [provider]  Biotin 5000 MCG CAPS Take 5,000 mcg by mouth daily.    Yes [provider]  carvedilol (COREG) 12.5 MG tablet Take 1 tablet (12.5 mg total) by mouth 2 (two) times daily with a meal. 05/19/17  Yes Paz, Alda Berthold, MD  digoxin  (LANOXIN) 0.125 MG tablet Take 1 tablet (125 mcg total) by mouth daily. 05/15/17  Yes Bensimhon, Shaune Pascal, MD  ENTRESTO 24-26 MG TAKE 1 TABLET BY MOUTH 2 (TWO) TIMES DAILY. 11/28/16  Yes Bensimhon, Shaune Pascal, MD  furosemide (LASIX) 40 MG tablet TAKE 2 TABLETS BY MOUTH EVERY MORNING AND 1 TABLET EVERY EVENING Patient taking differently: Take 40-80 mg by mouth See admin instructions. 80 mg in the morning and 40 mg in the evening 05/12/17  Yes Larey Dresser, MD  multivitamin [facility]) per tablet Take 1 tablet by mouth daily.    Yes [provider]  pantoprazole (PROTONIX) 40 MG tablet Take 1 tablet (40 mg total) by mouth daily. 05/19/17  Yes Paz, Alda Berthold, MD  potassium chloride (K-DUR) 10 MEQ tablet TAKE 3 TABLETS (30 MEQ TOTAL) BY MOUTH DAILY. 05/12/17  Yes Larey Dresser, MD  Probiotic Product (PROBIOTIC DAILY PO) Take 1 capsule by mouth daily.    Yes [provider]  sertraline (ZOLOFT) 25 MG tablet Take 1 tablet (25 mg total) by mouth daily. 05/19/17  Yes Paz, Alda Berthold, MD  spironolactone (ALDACTONE) 25 MG tablet Take 1 tablet (25 mg total) by mouth daily. 05/12/17  Yes Larey Dresser, MD  vitamin B-12 (CYANOCOBALAMIN) 1000 MCG tablet Take 1,000 mcg by mouth daily.   Yes [provider]  zolpidem (AMBIEN) 10 MG tablet Take 1 tablet (10 mg total) by mouth at bedtime as needed for sleep. 09/16/16  Yes Paz, Alda Berthold, MD  doxycycline (VIBRA-TABS) 100 MG tablet Take 1 tablet (100 mg total) by mouth 2 (two) times daily. Patient not taking: Reported on 05/20/2017 04/10/17   Colon Branch, MD  gabapentin (NEURONTIN) 300 MG capsule Take 300 mg by mouth daily as needed (for shingles flares).     [provider]    Physical Exam: Vitals:   05/20/17 2130 05/20/17 2230 05/20/17 2246 05/20/17 2301  BP: 101/60 124/65 (!) 129/50   Pulse: 82 82 89   Resp: 17 15    Temp:    97.7 F (36.5 C)  TempSrc:      SpO2: 96% 96% 97%   Height:        Constitutional: NAD, calm,  comfortable Eyes: PERRL, lids and conjunctivae normal ENMT: Mucous membranes are moist. Posterior pharynx clear of any exudate or lesions. Neck: normal, supple, no masses, no thyromegaly Respiratory: clear to auscultation bilaterally, no wheezing, no crackles. Normal respiratory effort. No accessory muscle use.  Cardiovascular: Regular rate and rhythm, Positive 3/6 systolic , no rubs / gallops. No extremity edema. 2+ pedal pulses. No carotid bruits.  Abdomen: Soft, no tenderness, no masses palpated. No hepatosplenomegaly. Bowel sounds positive.  Musculoskeletal: no clubbing / cyanosis. Good ROM, no contractures. Normal muscle tone.  Skin: no rashes, lesions, ulcers. No induration Neurologic: CN 2-12 grossly intact. The  speech is mildly slurred. Sensation intact, DTR normal. Strength 5/5 in all 4.  Psychiatric: Normal judgment and insight. Alert and oriented x 4.    Labs on Admission: I have personally reviewed following labs and imaging studies  CBC:  Recent Labs Lab 05/20/17 2042 05/20/17 2046  WBC 10.4  --   NEUTROABS 5.7  --   HGB 13.5 13.6  HCT 40.3 40.0  MCV 91.4  --   PLT 244  --    Basic Metabolic Panel:  Recent Labs Lab 05/20/17 2042 05/20/17 2046  NA 134* 138  K 3.9 3.9  CL 103 101  CO2 23  --   GLUCOSE 122* 123*  BUN 16 18  CREATININE 0.93 1.00  CALCIUM 9.7  --    GFR: Estimated Creatinine Clearance: 57.2 mL/min (by C-G formula based on SCr of 1 mg/dL). Liver Function Tests:  Recent Labs Lab 05/20/17 2042  AST 23  ALT 21  ALKPHOS 69  BILITOT 0.5  PROT 7.0  ALBUMIN 3.9   No results for input(s): LIPASE, AMYLASE in the last 168 hours. No results for input(s): AMMONIA in the last 168 hours. Coagulation Profile:  Recent Labs Lab 05/20/17 2042  INR 1.06   Cardiac Enzymes: No results for input(s): CKTOTAL, CKMB, CKMBINDEX, TROPONINI in the last 168 hours. BNP (last 3 results) No results for input(s): PROBNP in the last 8760 hours. HbA1C: No  results for input(s): HGBA1C in the last 72 hours. CBG: No results for input(s): GLUCAP in the last 168 hours. Lipid Profile: No results for input(s): CHOL, HDL, LDLCALC, TRIG, CHOLHDL, LDLDIRECT in the last 72 hours. Thyroid Function Tests: No results for input(s): TSH, T4TOTAL, FREET4, T3FREE, THYROIDAB in the last 72 hours. Anemia Panel: No results for input(s): VITAMINB12, FOLATE, FERRITIN, TIBC, IRON, RETICCTPCT in the last 72 hours. Urine analysis:    Component Value Date/Time   COLORURINE RED 12/04/2011 1443   APPEARANCEUR CLEAR 12/04/2011 1443   LABSPEC 1.025 12/04/2011 1443   PHURINE 6.0 12/04/2011 1443   GLUCOSEU NEGATIVE 12/04/2011 1443   HGBUR LARGE 12/04/2011 1443   BILIRUBINUR NEGATIVE 12/04/2011 1443   KETONESUR NEGATIVE 12/04/2011 1443   PROTEINUR >=300 (A) 08/12/2008 2050   UROBILINOGEN 0.2 12/04/2011 1443   NITRITE NEGATIVE 12/04/2011 1443   LEUKOCYTESUR NEGATIVE 12/04/2011 1443    Radiological Exams on Admission: Ct Angio Head W Or Wo Contrast  Result Date: 05/20/2017 CLINICAL DATA:  Speech difficulties EXAM: CT ANGIOGRAPHY HEAD AND NECK TECHNIQUE: Multidetector CT imaging of the head and neck was performed using the standard protocol during bolus administration of intravenous contrast. Multiplanar CT image reconstructions and MIPs were obtained to evaluate the vascular anatomy. Carotid stenosis measurements (when applicable) are obtained utilizing NASCET criteria, using the distal internal carotid diameter as the denominator. CONTRAST:  50 mL Isovue 370 COMPARISON:  None. FINDINGS: CTA NECK FINDINGS Aortic arch: There is no aneurysm or dissection of the visualized ascending aorta or aortic arch. There is an aberrant right subclavian artery. The visualized proximal subclavian arteries are normal. Right carotid system: The right common carotid origin is widely patent. There is no common carotid or internal carotid artery dissection or aneurysm. No hemodynamically  significant stenosis. Left carotid system: The left common carotid origin is widely patent. There is no common carotid or internal carotid artery dissection or aneurysm. No hemodynamically significant stenosis. Vertebral arteries: The vertebral system is left dominant. Both vertebral artery origins are normal. The right vertebral artery is diminutive along its entire course, likely congenital.  It terminates in the right posterior inferior cerebellar artery. Skeleton: There is no bony spinal canal stenosis. No lytic or blastic lesions. Other neck: The nasopharynx is clear. The oropharynx and hypopharynx are normal. The epiglottis is normal. The supraglottic larynx, glottis and subglottic larynx are normal. No retropharyngeal collection. The parapharyngeal spaces are preserved. The parotid and submandibular glands are normal. No sialolithiasis or salivary ductal dilatation. The thyroid gland is normal. There is no cervical lymphadenopathy. Upper chest: No pneumothorax or pleural effusion. No nodules or masses. Review of the MIP images confirms the above findings CTA HEAD FINDINGS Anterior circulation: --Intracranial internal carotid arteries: Normal. --Anterior cerebral arteries: Normal. Congenitally hypoplastic right A1 segment. --Middle cerebral arteries: Normal. --Posterior communicating arteries: Present bilaterally. Posterior circulation: --Posterior cerebral arteries: Normal. --Superior cerebellar arteries: Normal. --Basilar artery: Diminutive, likely owing to the presence of bilateral posterior communicating arteries. --Anterior inferior cerebellar arteries: Normal. --Posterior inferior cerebellar arteries: Normal. Venous sinuses: As permitted by contrast timing, patent. Anatomic variants: Diminutive right vertebral artery terminates in the posterior inferior cerebellar artery. Aberrant origin of the right subclavian artery from the descending thoracic aorta. Delayed phase: No parenchymal contrast enhancement.  Review of the MIP images confirms the above findings IMPRESSION: 1. No emergent large vessel occlusion or high-grade stenosis. 2. No hemodynamically significant stenosis of the major cervical arteries. These results were called by telephone at the time of interpretation on 05/20/2017 at 9:12 pm to Dr. Kerney Elbe , who verbally acknowledged these results. Electronically Signed   By: Ulyses Jarred M.D.   On: 05/20/2017 21:33   Ct Angio Neck W Or Wo Contrast  Result Date: 05/20/2017 CLINICAL DATA:  Speech difficulties EXAM: CT ANGIOGRAPHY HEAD AND NECK TECHNIQUE: Multidetector CT imaging of the head and neck was performed using the standard protocol during bolus administration of intravenous contrast. Multiplanar CT image reconstructions and MIPs were obtained to evaluate the vascular anatomy. Carotid stenosis measurements (when applicable) are obtained utilizing NASCET criteria, using the distal internal carotid diameter as the denominator. CONTRAST:  50 mL Isovue 370 COMPARISON:  None. FINDINGS: CTA NECK FINDINGS Aortic arch: There is no aneurysm or dissection of the visualized ascending aorta or aortic arch. There is an aberrant right subclavian artery. The visualized proximal subclavian arteries are normal. Right carotid system: The right common carotid origin is widely patent. There is no common carotid or internal carotid artery dissection or aneurysm. No hemodynamically significant stenosis. Left carotid system: The left common carotid origin is widely patent. There is no common carotid or internal carotid artery dissection or aneurysm. No hemodynamically significant stenosis. Vertebral arteries: The vertebral system is left dominant. Both vertebral artery origins are normal. The right vertebral artery is diminutive along its entire course, likely congenital. It terminates in the right posterior inferior cerebellar artery. Skeleton: There is no bony spinal canal stenosis. No lytic or blastic lesions. Other  neck: The nasopharynx is clear. The oropharynx and hypopharynx are normal. The epiglottis is normal. The supraglottic larynx, glottis and subglottic larynx are normal. No retropharyngeal collection. The parapharyngeal spaces are preserved. The parotid and submandibular glands are normal. No sialolithiasis or salivary ductal dilatation. The thyroid gland is normal. There is no cervical lymphadenopathy. Upper chest: No pneumothorax or pleural effusion. No nodules or masses. Review of the MIP images confirms the above findings CTA HEAD FINDINGS Anterior circulation: --Intracranial internal carotid arteries: Normal. --Anterior cerebral arteries: Normal. Congenitally hypoplastic right A1 segment. --Middle cerebral arteries: Normal. --Posterior communicating arteries: Present bilaterally. Posterior circulation: --Posterior cerebral arteries:  Normal. --Superior cerebellar arteries: Normal. --Basilar artery: Diminutive, likely owing to the presence of bilateral posterior communicating arteries. --Anterior inferior cerebellar arteries: Normal. --Posterior inferior cerebellar arteries: Normal. Venous sinuses: As permitted by contrast timing, patent. Anatomic variants: Diminutive right vertebral artery terminates in the posterior inferior cerebellar artery. Aberrant origin of the right subclavian artery from the descending thoracic aorta. Delayed phase: No parenchymal contrast enhancement. Review of the MIP images confirms the above findings IMPRESSION: 1. No emergent large vessel occlusion or high-grade stenosis. 2. No hemodynamically significant stenosis of the major cervical arteries. These results were called by telephone at the time of interpretation on 05/20/2017 at 9:12 pm to Dr. Kerney Elbe , who verbally acknowledged these results. Electronically Signed   By: Ulyses Jarred M.D.   On: 05/20/2017 21:33   Ct Head Code Stroke W/o Cm  Result Date: 05/20/2017 CLINICAL DATA:  Code stroke.  Slurred speech and facial droop  EXAM: CT HEAD WITHOUT CONTRAST TECHNIQUE: Contiguous axial images were obtained from the base of the skull through the vertex without intravenous contrast. COMPARISON:  Head CT 03/06/2010 FINDINGS: Brain: No mass lesion or acute hemorrhage. No focal hypoattenuation of the basal ganglia or cortex to indicate infarcted tissue. No hydrocephalus or age advanced atrophy. Vascular: No hyperdense vessel. No advanced atherosclerotic calcification of the arteries at the skull base. Skull: Normal visualized skull base, calvarium and extracranial soft tissues. Sinuses/Orbits: No sinus fluid levels or advanced mucosal thickening. No mastoid effusion. Normal orbits. ASPECTS [facility] Stroke Program Early CT Score) - Ganglionic level infarction (caudate, lentiform nuclei, internal capsule, insula, M1-M3 cortex): 7 - Supraganglionic infarction (M4-M6 cortex): 3 Total score (0-10 with 10 being normal): 10 IMPRESSION: 1. Normal head CT. 2. ASPECTS is 10. Dr.  Kerney Elbe was paged at 9:02 p.m. Electronically Signed   By: Ulyses Jarred M.D.   On: 05/20/2017 21:03   Echocardiogram 08/07/2016  LV EF: 20%  ------------------------------------------------------------------- Indications:      CHF - 428.0.  ------------------------------------------------------------------- History:   PMH:  Nonischemic Cardiomyopathy.  ------------------------------------------------------------------- Study Conclusions  - Left ventricle: The cavity size was moderately dilated. Wall   thickness was normal. The estimated ejection fraction was 20%.   Diffuse hypokinesis. Features are consistent with a pseudonormal   left ventricular filling pattern, with concomitant abnormal   relaxation and increased filling pressure (grade 2 diastolic   dysfunction). E/medial e&' > 15 suggesting LV end diastolic   pressure at least 20 mmHg. - Aortic valve: Poorly visualized. Moderately calcified leaflets.   There was mild stenosis. There was mild  regurgitation. Mean   gradient (S): 10 mm Hg. Valve area (VTI): 1.5 cm^2. - Mitral valve: Moderately to severely calcified annulus.   Moderately calcified leaflets . There was moderate central   regurgitation. - Left atrium: The atrium was mildly dilated. - Right ventricle: The cavity size was normal. Systolic function   was normal. - Right atrium: The atrium was mildly dilated. - Tricuspid valve: Peak RV-RA gradient (S): 29 mm Hg. - Pulmonary arteries: PA peak pressure: 32 mm Hg (S). - Inferior vena cava: The vessel was normal in size. The   respirophasic diameter changes were in the normal range (>= 50%),   consistent with normal central venous pressure.  Impressions:  - Moderately dilated LV with diffuse hypokinesis, EF 20%. Moderate   diastolic dysfunction with evidence for elevated LV filling   pressure. Normal RV size and systolic function. Moderate central   MR, likely functional. Mild aortic stenosis and aortic  regurgitation. Despite evidence for elevated LV filling pressure,   the IVC is small/non-dilated.  EKG: Independently reviewed. Vent. rate 79 BPM PR interval * ms QRS duration 132 ms QT/QTc 421/483 ms P-R-T axes 77 -76 90 Sinus rhythm IVCD, consider atypical RBBB Anterolateral infarct, age indeterminate ST elevation, consider inferior injury  Assessment/Plan Principal Problem:   TIA (transient ischemic attack) Observation/telemetry. Frequent neuro checks. Consult PT, OT and speech pathology. Check echocardiogram in a.m. Unable to perform MRIs due to presence of AICD. Check hemoglobin A1c and fasting lipids in a.m.  Active Problems:   Depression-insomnia. Continue sertraline 25 mg by mouth daily. Continue Ambien 5 mg by mouth at bedtime as needed.    Chronic systolic heart failure (HCC) Compensated. Entresto 24/26 mg po bid. carvedilol 12.5 mg by mouth twice a day. Continue the digoxin 125 g by mouth daily. furosemide 80 mg orally in a.m. and  40 mg in the evening Continue the spironolactone 25 mg by mouth daily. Will hold Entresto, carvedilol and furosemide. Resume once cleared by neurology.   DVT prophylaxis: Lovenox SQ. Code Status: Full code. Family Communication: Her daughter and granddaughter were present in her room. Disposition Plan: Admit for TIA/CVA workup. Consults called: Neurology. Admission status: Observation/telemetry.   Reubin Milan MD [facility] Pager [phone]  If 7PM-7AM, please contact night-coverage [email or web address] Password [facility]  05/20/2017, 11:08 PM

## 2017-05-20 NOTE — Code Documentation (Signed)
Responded to Code Stroke called at 2024.  Pt arrived to ED at 2038 c/o inabillity to grip with R hand, R side of mouth feeling different, as well as speech being different from normal. EXN-1700. CBG-127. BP-122/70.  NIH-0. CT and CTA-negative for acute findings. Code stroke cancelled at 2120

## 2017-05-20 NOTE — ED Notes (Addendum)
ED Provider at bedside.  No longer speech/facial problems.

## 2017-05-21 ENCOUNTER — Encounter (HOSPITAL_COMMUNITY): Payer: Self-pay | Admitting: General Practice

## 2017-05-21 ENCOUNTER — Observation Stay (HOSPITAL_BASED_OUTPATIENT_CLINIC_OR_DEPARTMENT_OTHER): Payer: Medicare Other

## 2017-05-21 ENCOUNTER — Observation Stay (HOSPITAL_COMMUNITY): Payer: Medicare Other

## 2017-05-21 DIAGNOSIS — G459 Transient cerebral ischemic attack, unspecified: Secondary | ICD-10-CM | POA: Diagnosis not present

## 2017-05-21 DIAGNOSIS — I5022 Chronic systolic (congestive) heart failure: Secondary | ICD-10-CM | POA: Diagnosis not present

## 2017-05-21 DIAGNOSIS — I340 Nonrheumatic mitral (valve) insufficiency: Secondary | ICD-10-CM | POA: Diagnosis not present

## 2017-05-21 DIAGNOSIS — G458 Other transient cerebral ischemic attacks and related syndromes: Secondary | ICD-10-CM

## 2017-05-21 DIAGNOSIS — G450 Vertebro-basilar artery syndrome: Secondary | ICD-10-CM

## 2017-05-21 LAB — LIPID PANEL
CHOLESTEROL: 201 mg/dL — AB (ref 0–200)
HDL: 39 mg/dL — ABNORMAL LOW (ref >40)
LDL Cholesterol: 126 mg/dL — ABNORMAL HIGH (ref 0–99)
TRIGLYCERIDES: 179 mg/dL — AB (ref <150)
Total CHOL/HDL Ratio: 5.2 RATIO
VLDL: 36 mg/dL (ref 0–40)

## 2017-05-21 LAB — ECHOCARDIOGRAM COMPLETE
HEIGHTINCHES: 64 in
Weight: 2959.46 oz

## 2017-05-21 LAB — POCT I-STAT TROPONIN I: TROPONIN I, POC: 0.01 ng/mL (ref 0.00–0.08)

## 2017-05-21 LAB — MAGNESIUM: Magnesium: 2.2 mg/dL (ref 1.7–2.4)

## 2017-05-21 LAB — GLUCOSE, CAPILLARY: GLUCOSE-CAPILLARY: 127 mg/dL — AB (ref 65–99)

## 2017-05-21 MED ORDER — STROKE: EARLY STAGES OF RECOVERY BOOK
Freq: Once | Status: AC
  Administered 2017-05-21: 1
  Filled 2017-05-21: qty 1

## 2017-05-21 MED ORDER — SACUBITRIL-VALSARTAN 24-26 MG PO TABS: 1.0000 | ORAL_TABLET | Freq: Two times a day (BID) | ORAL | 5 refills | Status: DC

## 2017-05-21 MED ORDER — ASPIRIN 325 MG PO TABS: 325.0000 mg | ORAL_TABLET | Freq: Every day | ORAL | 1 refills | Status: DC

## 2017-05-21 MED ORDER — ACETAMINOPHEN 325 MG PO TABS: 650.0000 mg | ORAL_TABLET | ORAL | Status: DC | PRN

## 2017-05-21 MED ORDER — CARVEDILOL 12.5 MG PO TABS: 12.5000 mg | ORAL_TABLET | Freq: Two times a day (BID) | ORAL | Status: DC

## 2017-05-21 MED ORDER — ACETAMINOPHEN 650 MG RE SUPP: 650.0000 mg | RECTAL | Status: DC | PRN

## 2017-05-21 MED ORDER — SPIRONOLACTONE 25 MG PO TABS
25.0000 mg | ORAL_TABLET | Freq: Every day | ORAL | Status: DC
  Administered 2017-05-21: 25 mg via ORAL
  Filled 2017-05-21: qty 1

## 2017-05-21 MED ORDER — GABAPENTIN 300 MG PO CAPS: 300.0000 mg | ORAL_CAPSULE | Freq: Every day | ORAL | Status: DC | PRN

## 2017-05-21 MED ORDER — ZOLPIDEM TARTRATE 5 MG PO TABS
5.0000 mg | ORAL_TABLET | Freq: Every evening | ORAL | Status: DC | PRN
Start: 2017-05-21 — End: 2017-05-21

## 2017-05-21 MED ORDER — FUROSEMIDE 80 MG PO TABS
80.0000 mg | ORAL_TABLET | ORAL | Status: DC
  Administered 2017-05-21: 80 mg via ORAL
  Filled 2017-05-21: qty 1

## 2017-05-21 MED ORDER — SACUBITRIL-VALSARTAN 24-26 MG PO TABS
1.0000 | ORAL_TABLET | Freq: Two times a day (BID) | ORAL | Status: DC
  Filled 2017-05-21: qty 1

## 2017-05-21 MED ORDER — POTASSIUM CHLORIDE ER 10 MEQ PO TBCR
30.0000 meq | EXTENDED_RELEASE_TABLET | Freq: Every day | ORAL | Status: DC
  Administered 2017-05-21: 30 meq via ORAL
  Filled 2017-05-21 (×2): qty 3

## 2017-05-21 MED ORDER — FUROSEMIDE 40 MG PO TABS: 40.0000 mg | ORAL_TABLET | ORAL | Status: DC

## 2017-05-21 MED ORDER — ASPIRIN 325 MG PO TABS
325.0000 mg | ORAL_TABLET | Freq: Every day | ORAL | Status: DC
  Administered 2017-05-21: 325 mg via ORAL
  Filled 2017-05-21: qty 1

## 2017-05-21 MED ORDER — PANTOPRAZOLE SODIUM 40 MG PO TBEC
40.0000 mg | DELAYED_RELEASE_TABLET | Freq: Every day | ORAL | Status: DC
  Administered 2017-05-21: 40 mg via ORAL
  Filled 2017-05-21: qty 1

## 2017-05-21 MED ORDER — FUROSEMIDE 40 MG PO TABS
40.0000 mg | ORAL_TABLET | Freq: Every evening | ORAL | Status: DC
  Administered 2017-05-21: 40 mg via ORAL
  Filled 2017-05-21: qty 1

## 2017-05-21 MED ORDER — DIGOXIN 125 MCG PO TABS
125.0000 ug | ORAL_TABLET | Freq: Every day | ORAL | Status: DC
  Administered 2017-05-21: 125 ug via ORAL
  Filled 2017-05-21: qty 1

## 2017-05-21 MED ORDER — SERTRALINE HCL 50 MG PO TABS
25.0000 mg | ORAL_TABLET | Freq: Every day | ORAL | Status: DC
  Administered 2017-05-21: 25 mg via ORAL
  Filled 2017-05-21: qty 1

## 2017-05-21 MED ORDER — ATORVASTATIN CALCIUM 40 MG PO TABS
40.0000 mg | ORAL_TABLET | Freq: Every day | ORAL | Status: DC
  Administered 2017-05-21: 40 mg via ORAL
  Filled 2017-05-21: qty 1

## 2017-05-21 MED ORDER — ACETAMINOPHEN 160 MG/5ML PO SOLN: 650.0000 mg | ORAL | Status: DC | PRN

## 2017-05-21 MED ORDER — ATORVASTATIN CALCIUM 40 MG PO TABS: 40.0000 mg | ORAL_TABLET | Freq: Every day | ORAL | 11 refills | Status: DC

## 2017-05-21 MED ORDER — HEPARIN SODIUM (PORCINE) 5000 UNIT/ML IJ SOLN
5000.0000 [IU] | Freq: Three times a day (TID) | INTRAMUSCULAR | Status: DC
  Administered 2017-05-21: 5000 [IU] via SUBCUTANEOUS
  Filled 2017-05-21: qty 1

## 2017-05-21 NOTE — Evaluation (Signed)
Physical Therapy Evaluation Patient Details Name: [name] MRN: [id number] DOB: 01/11/1951 Today's Date: 05/21/2017   History of Present Illness  [name] is a 66 y.o. female with medical history significant of breast cancer, depression, diverticulosis with an episode diverticulitis, early menopause, glaucoma, nonischemic cardiomyopathy, obstructive sleep apnea not on CPAP, osteopenia, urolithiasis admitted to the emergency department via EMS after first becoming lightheaded, being unable to reach for her glasses with her right hand and having slurred speech.  CT head negative for abnormality, unable to have MRI.  Clinical Impression  Pt is at or close to baseline functioning and should be safe at home. There are no further acute PT needs.  Will sign off at this time.     Follow Up Recommendations No PT follow up    Equipment Recommendations  None recommended by PT    Recommendations for Other Services       Precautions / Restrictions Precautions Precautions: None      Mobility  Bed Mobility               General bed mobility comments: NT, pt in the chair  Transfers Overall transfer level: Independent Equipment used: None                Ambulation/Gait Ambulation/Gait assistance: Independent Ambulation Distance (Feet): 500 Feet Assistive device: None Gait Pattern/deviations: WFL(Within Functional Limits) Gait velocity: community appropriate Gait velocity interpretation: >2.62 ft/sec, indicative of independent International aid/development worker Details: see DGI  Stairs Stairs: Yes Stairs assistance: Modified independent (Device/Increase time) Stair Management: One rail Right;Alternating pattern;Forwards Number of Stairs: 3 General stair comments: safe with rail  Wheelchair Mobility    Modified Rankin (Stroke Patients Only) Modified Rankin (Stroke Patients Only) Pre-Morbid Rankin Score: No symptoms Modified Rankin: No significant  disability     Balance Overall balance assessment: Independent                               Standardized Balance Assessment Standardized Balance Assessment : Dynamic Gait Index   Dynamic Gait Index Level Surface: Normal Change in Gait Speed: Normal Gait with Horizontal Head Turns: Normal Gait with Vertical Head Turns: Normal Gait and Pivot Turn: Normal Step Over Obstacle: Normal Step Around Obstacles: Normal Steps: Mild Impairment Total Score: 23       Pertinent Vitals/Pain Pain Assessment: No/denies pain    Home Living Family/patient expects to be discharged to:: Private residence Living Arrangements: Spouse/significant other Available Help at Discharge: Family;Available 24 hours/day Type of Home: House Home Access: Stairs to enter Entrance Stairs-Rails: Psychiatric nurse of Steps: several Home Layout: Two level;Bed/bath upstairs Home Equipment:  (access to equipment)      Prior Function Level of Independence: Independent               Hand Dominance        Extremity/Trunk Assessment   Upper Extremity Assessment Upper Extremity Assessment: Defer to OT evaluation    Lower Extremity Assessment Lower Extremity Assessment: Overall WFL for tasks assessed       Communication   Communication: No difficulties  Cognition Arousal/Alertness: Awake/alert Behavior During Therapy: WFL for tasks assessed/performed Overall Cognitive Status: Within Functional Limits for tasks assessed  General Comments      Exercises     Assessment/Plan    PT Assessment Patent does not need any further PT services  PT Problem List         PT Treatment Interventions      PT Goals (Current goals can be found in the Care Plan section)  Acute Rehab PT Goals PT Goal Formulation: All assessment and education complete, DC therapy    Frequency     Barriers to discharge         Co-evaluation               AM-PAC PT "6 Clicks" Daily Activity  Outcome Measure Difficulty turning over in bed (including adjusting bedclothes, sheets and blankets)?: None Difficulty moving from lying on back to sitting on the side of the bed? : None Difficulty sitting down on and standing up from a chair with arms (e.g., wheelchair, bedside commode, etc,.)?: None Help needed moving to and from a bed to chair (including a wheelchair)?: None Help needed walking in hospital room?: None Help needed climbing 3-5 steps with a railing? : None 6 Click Score: 24    End of Session   Activity Tolerance: Patient tolerated treatment well Patient left: in chair;with call bell/phone within reach;with family/visitor present Nurse Communication: Mobility status PT Visit Diagnosis: Other abnormalities of gait and mobility (R26.89)    Time: 2863-8177 PT Time Calculation (min) (ACUTE ONLY): 17 min   Charges:   PT Evaluation $PT Eval Low Complexity: 1 Procedure     PT G Codes:   PT G-Codes **NOT FOR INPATIENT CLASS** Functional Assessment Tool Used: AM-PAC 6 Clicks Basic Mobility;Clinical judgement Functional Limitation: Mobility: Walking and moving around Mobility: Walking and Moving Around Current Status (N1657): 0 percent impaired, limited or restricted Mobility: Walking and Moving Around Goal Status (X0383): 0 percent impaired, limited or restricted Mobility: Walking and Moving Around Discharge Status [phone]): 0 percent impaired, limited or restricted    05/21/2017  Donnella Sham, PT [phone]  (pager)  Tessie Fass Aitan Rossbach 05/21/2017, 10:39 AM

## 2017-05-21 NOTE — Progress Notes (Signed)
Patient arrived around 0100 alert and oriented symptoms of facial droop and mild speech issues were perceptible, no other deficits assessed. Oriented patient and family to room and fall precautions Q2 N/V started. Will continue to monitor.

## 2017-05-21 NOTE — Consult Note (Signed)
Referring Physician: Dr. Olevia Bowens    Chief Complaint: Right hand weakness and slurred speech  HPI: [name] is an 66 y.o. female who presented to the ED with a chief complaint of right handgrip weakness, changed sensation to right side of mouth and speech change. LKN was 1945. Her CBG was 127 and BP was 122/70.      Above symptoms were preceded by a sensation of lightheadedness. At time of Neurology evaluation, her symptoms had resolved with the exception of subtle dysarthria with a lisping quality.    She has systolic CHF. TTE from 08/07/16 revealed a moderately dilated LV with diffuse hypokinesis and EF of 20%. Moderate diastolic dysfunction with evidence for elevated LV filling  pressure. Normal RV size and systolic function. Moderate central MR, likely functional. Mild aortic stenosis and aortic regurgitation. Despite evidence for elevated LV filling pressure, the IVC was small/non-dilated.  LSN: 1947 tPA Given: No: NIHSS = 0  Past Medical History:  Diagnosis Date  . Breast CA ([place])    twice first on L breast 1982 w/ chest wall involvment, then a second breast ca on the R in 1987  . Chronic systolic CHF (congestive heart failure) ([place])    a. due to Adriamycin,s/p ICD;  b. 01/2013 Gen change and new LV lead - BSX Energen CRT-D BiV ICD, Ser # [id number]  . Depression   . Diverticulitis   . Early menopause    early 47s  . Fatigue 02/20/2016  . Glaucoma suspect of both eyes    Dr. Marshall Cork  . Nonischemic cardiomyopathy ([place])   . OSA (obstructive sleep apnea) 02/07/2016  . Osteopenia   . Renal calculus   . Snoring 02/20/2016    Past Surgical History:  Procedure Laterality Date  . BI-VENTRICULAR IMPLANTABLE CARDIOVERTER DEFIBRILLATOR UPGRADE N/A 02/04/2013   Procedure: BI-VENTRICULAR IMPLANTABLE CARDIOVERTER DEFIBRILLATOR UPGRADE;  Surgeon: Evans Lance, MD;  Location: [facility] CATH LAB;  Service: Cardiovascular;  Laterality: N/A;  . CARDIAC DEFIBRILLATOR PLACEMENT     AICD  Replaced-5/09 and 2014  . LEFT AND RIGHT HEART CATHETERIZATION WITH CORONARY ANGIOGRAM N/A 10/06/2013   Procedure: LEFT AND RIGHT HEART CATHETERIZATION WITH CORONARY ANGIOGRAM;  Surgeon: Jolaine Artist, MD;  Location: [facility] CATH LAB;  Service: Cardiovascular;  Laterality: N/A;  . LITHOTRIPSY     h/o several procedures   . MASTECTOMY     B  . OOPHORECTOMY     B, per genticists advise at [place]  . VENOGRAM N/A 10/20/2012   Procedure: VENOGRAM;  Surgeon: Deboraha Sprang, MD;  Location: [facility] CATH LAB;  Service: Cardiovascular;  Laterality: N/A;    Family History  Problem Relation Age of Onset  . Kidney cancer Brother   . Breast cancer Mother        M and sister   . Heart attack Neg Hx   . Diabetes Neg Hx   . Colon cancer Neg Hx    Social History:  reports that she has never smoked. She has never used smokeless tobacco. She reports that she drinks alcohol. She reports that she does not use drugs.  Allergies:  Allergies  Allergen Reactions  . Atacand [Candesartan] Other (See Comments)    Causes fatigue   . Cephalexin Hives and Itching  . Latex Rash    Home Medications: multivitamin (THERAGRAN) per tablet Take 1 tablet by mouth daily.  [provider] Needs Review  doxycycline (VIBRA-TABS) 100 MG tablet Take 1 tablet (100 mg total) by mouth 2 (two)  times daily. Colon Branch, MD Needs Review   Patient not taking: Reported on 05/20/2017     ascorbic acid (VITAMIN C) 1000 MG tablet Take 1,000 mg by mouth daily. Reubin Milan, MD Not Ordered  Biotin 5000 MCG CAPS Take 5,000 mcg by mouth daily.  Reubin Milan, MD Not Ordered  carvedilol (COREG) 12.5 MG tablet Take 1 tablet (12.5 mg total) by mouth 2 (two) times daily with a meal. Reubin Milan, MD Reordered  Orderedas:carvedilol (COREG) tablet 12.5 mg - 12.5 mg, Oral, 2 times daily with meals, First dose on Thu 05/21/17 at 0800  digoxin (LANOXIN) 0.125 MG tablet Take 1 tablet (125 mcg total) by mouth daily.  Reubin Milan, MD Reordered  Orderedas:digoxin [facility]) tablet 125 mcg - 125 mcg, Oral, Daily, First dose on Thu 05/21/17 at 1000  ENTRESTO 24-26 MG TAKE 1 TABLET BY MOUTH 2 (TWO) TIMES DAILY. Reubin Milan, MD Reordered  Orderedas:sacubitril-valsartan [facility]) 24-26 mg per tablet - 1 tablet, Oral, 2 times daily, First dose on Thu 05/21/17 at 1000 ACE-inhibitors have NOT been administered in the past 36-hours. YES (confirmed by ordering provider)  furosemide (LASIX) 40 MG tablet TAKE 2 TABLETS BY MOUTH EVERY MORNING AND 1 TABLET EVERY EVENING Reubin Milan, MD Not Ordered   Patient taking differently: Take 40-80 mg by mouth See admin instructions. 80 mg in the morning and 40 mg in the evening    Orderedas:furosemide (LASIX) tablet 40-80 mg - 40-80 mg, Oral, See admin instructions, Starting Thu 05/21/17 at 0116 (Discontinued)  pantoprazole (PROTONIX) 40 MG tablet Take 1 tablet (40 mg total) by mouth daily. Reubin Milan, MD Reordered  Orderedas:pantoprazole (PROTONIX) EC tablet 40 mg - 40 mg, Oral, Daily, First dose on Thu 05/21/17 at 1000  potassium chloride (K-DUR) 10 MEQ tablet TAKE 3 TABLETS (30 MEQ TOTAL) BY MOUTH DAILY. Reubin Milan, MD Reordered  Orderedas:potassium chloride (K-DUR) CR tablet 30 mEq - 30 mEq, Oral, Daily, First dose on Thu 05/21/17 at 1000  Probiotic Product (PROBIOTIC DAILY PO) Take 1 capsule by mouth daily.  Reubin Milan, MD Not Ordered  sertraline (ZOLOFT) 25 MG tablet Take 1 tablet (25 mg total) by mouth daily. Reubin Milan, MD Reordered  Orderedas:sertraline (ZOLOFT) tablet 25 mg - 25 mg, Oral, Daily, First dose on Thu 05/21/17 at 1000  spironolactone (ALDACTONE) 25 MG tablet Take 1 tablet (25 mg total) by mouth daily. Reubin Milan, MD Reordered  Orderedas:spironolactone (ALDACTONE) tablet 25 mg - 25 mg, Oral, Daily, First dose on Thu 05/21/17 at 1000  vitamin B-12 (CYANOCOBALAMIN) 1000 MCG tablet Take 1,000 mcg  by mouth daily. Reubin Milan, MD Not Ordered  zolpidem (AMBIEN) 10 MG tablet Take 1 tablet (10 mg total) by mouth at bedtime as needed for sleep. Reubin Milan, MD Reordered  Orderedas:zolpidem [facility]) tablet 5 mg - For female patients and patients age 30 years or older, dosage automatically limited to 5 mg. 5 mg, Oral, At bedtime PRN, sleep, Starting Thu 05/21/17 at 0116  gabapentin (NEURONTIN) 300 MG capsule Take 300 mg by mouth daily as needed (for shingles flares).  Reubin Milan, MD Not Ordered    ROS: Positive for diaphoresis and lightheadedness. No vision changes, fever, abdominal pain, N/V or CP.   Physical Examination: Blood pressure 118/75, pulse 81, temperature 97.7 F (36.5 C), resp. rate 17, height _0  (1.626 m), SpO2 96 %.  HEENT: [place]/AT Lungs: Respirations unlabored Ext: Warm and well perfused  Neurologic Examination: Mental Status: Alert, oriented, thought content appropriate.  Speech fluent without evidence of aphasia.  Able to follow all step commands without difficulty. Subtle dysarthria with lisping quality. Cranial Nerves:  II:  Visual fields intact, PERRL III,IV, VI: ptosis not present, EOMI without nystagmus V,VII: smile slightly asymmetric but with equal contraction of facial musculature with smiling and grimacing. Temp sensation intact bilaterally.  VIII: hearing intact to voice IX,X: no hypophonia or hoarseness XI: symmetric XII: midline tongue extension  Motor: Right : Upper extremity   5/5    Left:     Upper extremity   5/5  Lower extremity   5/5     Lower extremity   5/5 Normal tone throughout; no atrophy noted Sensory: Temp and light touch intact x 4 without extinction Deep Tendon Reflexes:  2+ bilateral upper and lower extremities. Toes downgoing bilaterally Cerebellar: No ataxia with FNF bilaterally Gait: Deferred due to acuity of presentation  Results for orders placed or performed during the hospital encounter of 05/20/17  (from the past 48 hour(s))  Protime-INR     Status: None   Collection Time: 05/20/17  8:42 PM  Result Value Ref Range   Prothrombin Time 13.8 11.4 - 15.2 seconds   INR 1.06   APTT     Status: None   Collection Time: 05/20/17  8:42 PM  Result Value Ref Range   aPTT 34 24 - 36 seconds  CBC     Status: None   Collection Time: 05/20/17  8:42 PM  Result Value Ref Range   WBC 10.4 4.0 - 10.5 K/uL   RBC 4.41 3.87 - 5.11 MIL/uL   Hemoglobin 13.5 12.0 - 15.0 g/dL   HCT 40.3 36.0 - 46.0 %   MCV 91.4 78.0 - 100.0 fL   MCH 30.6 26.0 - 34.0 pg   MCHC 33.5 30.0 - 36.0 g/dL   RDW 12.8 11.5 - 15.5 %   Platelets 244 150 - 400 K/uL  Differential     Status: None   Collection Time: 05/20/17  8:42 PM  Result Value Ref Range   Neutrophils Relative % 54 %   Lymphocytes Relative 34 %   Monocytes Relative 9 %   Eosinophils Relative 3 %   Basophils Relative 0 %   Neutro Abs 5.7 1.7 - 7.7 K/uL   Lymphs Abs 3.5 0.7 - 4.0 K/uL   Monocytes Absolute 0.9 0.1 - 1.0 K/uL   Eosinophils Absolute 0.3 0.0 - 0.7 K/uL   Basophils Absolute 0.0 0.0 - 0.1 K/uL   Smear Review MORPHOLOGY UNREMARKABLE   Comprehensive metabolic panel     Status: Abnormal   Collection Time: 05/20/17  8:42 PM  Result Value Ref Range   Sodium 134 (L) 135 - 145 mmol/L   Potassium 3.9 3.5 - 5.1 mmol/L   Chloride 103 101 - 111 mmol/L   CO2 23 22 - 32 mmol/L   Glucose, Bld 122 (H) 65 - 99 mg/dL   BUN 16 6 - 20 mg/dL   Creatinine, Ser 0.93 0.44 - 1.00 mg/dL   Calcium 9.7 8.9 - 10.3 mg/dL   Total Protein 7.0 6.5 - 8.1 g/dL   Albumin 3.9 3.5 - 5.0 g/dL   AST 23 15 - 41 U/L   ALT 21 14 - 54 U/L   Alkaline Phosphatase 69 38 - 126 U/L   Total Bilirubin 0.5 0.3 - 1.2 mg/dL   GFR calc non Af Amer >60 >60 mL/min   GFR calc Af Amer >  60 >60 mL/min    Comment: (NOTE) The eGFR has been calculated using the CKD EPI equation. This calculation has not been validated in all clinical situations. eGFR's persistently <60 mL/min signify possible  Chronic Kidney Disease.    Anion gap 8 5 - 15  I-Stat Chem 8, ED     Status: Abnormal   Collection Time: 05/20/17  8:46 PM  Result Value Ref Range   Sodium 138 135 - 145 mmol/L   Potassium 3.9 3.5 - 5.1 mmol/L   Chloride 101 101 - 111 mmol/L   BUN 18 6 - 20 mg/dL   Creatinine, Ser 1.00 0.44 - 1.00 mg/dL   Glucose, Bld 123 (H) 65 - 99 mg/dL   Calcium, Ion 1.14 (L) 1.15 - 1.40 mmol/L   TCO2 24 0 - 100 mmol/L   Hemoglobin 13.6 12.0 - 15.0 g/dL   HCT 40.0 36.0 - 46.0 %  Digoxin level     Status: Abnormal   Collection Time: 05/20/17 10:35 PM  Result Value Ref Range   Digoxin Level 0.3 (L) 0.8 - 2.0 ng/mL   Ct Angio Head W Or Wo Contrast  Result Date: 05/20/2017 CLINICAL DATA:  Speech difficulties EXAM: CT ANGIOGRAPHY HEAD AND NECK TECHNIQUE: Multidetector CT imaging of the head and neck was performed using the standard protocol during bolus administration of intravenous contrast. Multiplanar CT image reconstructions and MIPs were obtained to evaluate the vascular anatomy. Carotid stenosis measurements (when applicable) are obtained utilizing NASCET criteria, using the distal internal carotid diameter as the denominator. CONTRAST:  50 mL Isovue 370 COMPARISON:  None. FINDINGS: CTA NECK FINDINGS Aortic arch: There is no aneurysm or dissection of the visualized ascending aorta or aortic arch. There is an aberrant right subclavian artery. The visualized proximal subclavian arteries are normal. Right carotid system: The right common carotid origin is widely patent. There is no common carotid or internal carotid artery dissection or aneurysm. No hemodynamically significant stenosis. Left carotid system: The left common carotid origin is widely patent. There is no common carotid or internal carotid artery dissection or aneurysm. No hemodynamically significant stenosis. Vertebral arteries: The vertebral system is left dominant. Both vertebral artery origins are normal. The right vertebral artery is  diminutive along its entire course, likely congenital. It terminates in the right posterior inferior cerebellar artery. Skeleton: There is no bony spinal canal stenosis. No lytic or blastic lesions. Other neck: The nasopharynx is clear. The oropharynx and hypopharynx are normal. The epiglottis is normal. The supraglottic larynx, glottis and subglottic larynx are normal. No retropharyngeal collection. The parapharyngeal spaces are preserved. The parotid and submandibular glands are normal. No sialolithiasis or salivary ductal dilatation. The thyroid gland is normal. There is no cervical lymphadenopathy. Upper chest: No pneumothorax or pleural effusion. No nodules or masses. Review of the MIP images confirms the above findings CTA HEAD FINDINGS Anterior circulation: --Intracranial internal carotid arteries: Normal. --Anterior cerebral arteries: Normal. Congenitally hypoplastic right A1 segment. --Middle cerebral arteries: Normal. --Posterior communicating arteries: Present bilaterally. Posterior circulation: --Posterior cerebral arteries: Normal. --Superior cerebellar arteries: Normal. --Basilar artery: Diminutive, likely owing to the presence of bilateral posterior communicating arteries. --Anterior inferior cerebellar arteries: Normal. --Posterior inferior cerebellar arteries: Normal. Venous sinuses: As permitted by contrast timing, patent. Anatomic variants: Diminutive right vertebral artery terminates in the posterior inferior cerebellar artery. Aberrant origin of the right subclavian artery from the descending thoracic aorta. Delayed phase: No parenchymal contrast enhancement. Review of the MIP images confirms the above findings IMPRESSION: 1. No emergent large  vessel occlusion or high-grade stenosis. 2. No hemodynamically significant stenosis of the major cervical arteries. These results were called by telephone at the time of interpretation on 05/20/2017 at 9:12 pm to Dr. Kerney Elbe , who verbally acknowledged  these results. Electronically Signed   By: Ulyses Jarred M.D.   On: 05/20/2017 21:33   Ct Angio Neck W Or Wo Contrast  Result Date: 05/20/2017 CLINICAL DATA:  Speech difficulties EXAM: CT ANGIOGRAPHY HEAD AND NECK TECHNIQUE: Multidetector CT imaging of the head and neck was performed using the standard protocol during bolus administration of intravenous contrast. Multiplanar CT image reconstructions and MIPs were obtained to evaluate the vascular anatomy. Carotid stenosis measurements (when applicable) are obtained utilizing NASCET criteria, using the distal internal carotid diameter as the denominator. CONTRAST:  50 mL Isovue 370 COMPARISON:  None. FINDINGS: CTA NECK FINDINGS Aortic arch: There is no aneurysm or dissection of the visualized ascending aorta or aortic arch. There is an aberrant right subclavian artery. The visualized proximal subclavian arteries are normal. Right carotid system: The right common carotid origin is widely patent. There is no common carotid or internal carotid artery dissection or aneurysm. No hemodynamically significant stenosis. Left carotid system: The left common carotid origin is widely patent. There is no common carotid or internal carotid artery dissection or aneurysm. No hemodynamically significant stenosis. Vertebral arteries: The vertebral system is left dominant. Both vertebral artery origins are normal. The right vertebral artery is diminutive along its entire course, likely congenital. It terminates in the right posterior inferior cerebellar artery. Skeleton: There is no bony spinal canal stenosis. No lytic or blastic lesions. Other neck: The nasopharynx is clear. The oropharynx and hypopharynx are normal. The epiglottis is normal. The supraglottic larynx, glottis and subglottic larynx are normal. No retropharyngeal collection. The parapharyngeal spaces are preserved. The parotid and submandibular glands are normal. No sialolithiasis or salivary ductal dilatation. The  thyroid gland is normal. There is no cervical lymphadenopathy. Upper chest: No pneumothorax or pleural effusion. No nodules or masses. Review of the MIP images confirms the above findings CTA HEAD FINDINGS Anterior circulation: --Intracranial internal carotid arteries: Normal. --Anterior cerebral arteries: Normal. Congenitally hypoplastic right A1 segment. --Middle cerebral arteries: Normal. --Posterior communicating arteries: Present bilaterally. Posterior circulation: --Posterior cerebral arteries: Normal. --Superior cerebellar arteries: Normal. --Basilar artery: Diminutive, likely owing to the presence of bilateral posterior communicating arteries. --Anterior inferior cerebellar arteries: Normal. --Posterior inferior cerebellar arteries: Normal. Venous sinuses: As permitted by contrast timing, patent. Anatomic variants: Diminutive right vertebral artery terminates in the posterior inferior cerebellar artery. Aberrant origin of the right subclavian artery from the descending thoracic aorta. Delayed phase: No parenchymal contrast enhancement. Review of the MIP images confirms the above findings IMPRESSION: 1. No emergent large vessel occlusion or high-grade stenosis. 2. No hemodynamically significant stenosis of the major cervical arteries. These results were called by telephone at the time of interpretation on 05/20/2017 at 9:12 pm to Dr. Kerney Elbe , who verbally acknowledged these results. Electronically Signed   By: Ulyses Jarred M.D.   On: 05/20/2017 21:33   Ct Head Code Stroke W/o Cm  Result Date: 05/20/2017 CLINICAL DATA:  Code stroke.  Slurred speech and facial droop EXAM: CT HEAD WITHOUT CONTRAST TECHNIQUE: Contiguous axial images were obtained from the base of the skull through the vertex without intravenous contrast. COMPARISON:  Head CT 03/06/2010 FINDINGS: Brain: No mass lesion or acute hemorrhage. No focal hypoattenuation of the basal ganglia or cortex to indicate infarcted tissue. No  hydrocephalus or  age advanced atrophy. Vascular: No hyperdense vessel. No advanced atherosclerotic calcification of the arteries at the skull base. Skull: Normal visualized skull base, calvarium and extracranial soft tissues. Sinuses/Orbits: No sinus fluid levels or advanced mucosal thickening. No mastoid effusion. Normal orbits. ASPECTS [facility] Stroke Program Early CT Score) - Ganglionic level infarction (caudate, lentiform nuclei, internal capsule, insula, M1-M3 cortex): 7 - Supraganglionic infarction (M4-M6 cortex): 3 Total score (0-10 with 10 being normal): 10 IMPRESSION: 1. Normal head CT. 2. ASPECTS is 10. Dr.  Kerney Elbe was paged at 9:02 p.m. Electronically Signed   By: Ulyses Jarred M.D.   On: 05/20/2017 21:03    Assessment: 65 y.o. female with TIA manifesting as right hand weakness, right perioral sensory change and dysarthria 1. Most likely mechanism, given systolic CHF with low EF of 20%, is watershed TIA involving the left cerebral hemisphere (ACA/MCA watershed territory).  2. CT head normal. CTA head and neck shows no LVO. 3. EKG shows no atrial fibrillation.  4. Has AICD, unable to perform MRI.  5. Stroke Risk Factors - CHF  Plan: 1. Permissive HTN for now. Will need Cardiology consult for recommendations regarding optimization of cardiac output without compromising cardiac function. 2. Orthostatics 3. Start ASA 81 mg po qd. 4. PT consult, OT consult, Speech consult 5. Repeat TTE 6. Has AICD, unable to perform MRI. 7. Start atorvastatin 40 mg po qd. Obtain baseline CK level.  8. Telemetry monitoring 9. Frequent neuro checks  _0  signed: Dr. Kerney Elbe 05/21/2017, 1:04 AM

## 2017-05-21 NOTE — Care Management Obs Status (Signed)
[place] NOTIFICATION   Patient Details  Name: [name] MRN: [id number] Date of Birth: [birth year]   Medicare Observation Status Notification Given:  Yes    Pollie Friar, RN 05/21/2017, 3:15 PM

## 2017-05-21 NOTE — Progress Notes (Signed)
RN called [employer] investment banker)  to Production assistant, radio. Dr. Meda Coffee paged to read echocardiogram at this time prior to discharge.   Ave Filter, RN

## 2017-05-21 NOTE — Progress Notes (Signed)
Joe from [employer] spoke to patient and verified with RN that pt will interrogate her ICD/pacemaker when she gets home.    Ave Filter, RN

## 2017-05-21 NOTE — Evaluation (Signed)
Occupational Therapy Evaluation and Discharge Patient Details Name: [name] MRN: [id number] DOB: [birth year] Today's Date: 05/21/2017    History of Present Illness Alyce C Whitelock is a 66 y.o. female with medical history significant of breast cancer, depression, diverticulosis with an episode diverticulitis, early menopause, glaucoma, nonischemic cardiomyopathy, obstructive sleep apnea not on CPAP, osteopenia, urolithiasis admitted to the emergency department via EMS after first becoming lightheaded, being unable to reach for her glasses with her right hand and having slurred speech.  CT head negative for abnormality, unable to have MRI.   Clinical Impression   PTA Pt independent in ADL and mobility. Very active, mobilizes in community, drives, etc. Pt is back at independent level and is functional - the only difference is her sensation in her right upper extremity remains "dull" and she dropped her toothbrush during oral care and said that the fork "felt funny" during feeding. - However she is able to complete these tasks. Pt provided with red tubing to build up handles to account for sensory differences. She has good family support, and no OT follow up is needed at this time. All OT assessment and education is complete and the Pt and family had no questions at the end of the session. OT to sign off, thank you for this referral.     Follow Up Recommendations  No OT follow up    Equipment Recommendations  None recommended by OT    Recommendations for Other Services       Precautions / Restrictions Precautions Precautions: None Restrictions Weight Bearing Restrictions: No      Mobility Bed Mobility               General bed mobility comments: Pt OOB in the recliner when OT entered the room  Transfers Overall transfer level: Independent Equipment used: None                  Balance Overall balance assessment: Independent                                        ADL either performed or assessed with clinical judgement   ADL Overall ADL's : Modified independent                                       General ADL Comments: red tubing provided to build up handles during prolongued use of right hand     Vision Baseline Vision/History: Wears glasses Wears Glasses: At all times Patient Visual Report: No change from baseline Additional Comments: Pt able to read from menu with no problems, no trouble navigating environment. not formally assessed, but no problems     Perception     Praxis      Pertinent Vitals/Pain Pain Assessment: No/denies pain     Hand Dominance Right   Extremity/Trunk Assessment Upper Extremity Assessment Upper Extremity Assessment: RUE deficits/detail;Overall [facility] for tasks assessed RUE Deficits / Details: Pt has 5/5 strength in grasp, full ROM. She is able to write with a pen and writing is similiar, Pt reported that when she has to perform tasks that are prolomgued, that her hand "feels funny"  She dropped her toothbrush several times, and the fork felt funny when eating.  RUE Sensation: decreased light touch   Lower Extremity Assessment Lower Extremity Assessment:  Overall [facility] for tasks assessed   Cervical / Trunk Assessment Cervical / Trunk Assessment: Normal   Communication Communication Communication: No difficulties   Cognition Arousal/Alertness: Awake/alert Behavior During Therapy: WFL for tasks assessed/performed Overall Cognitive Status: Within Functional Limits for tasks assessed                                     General Comments  Husband, family in room during session    Exercises     Shoulder Instructions      Home Living Family/patient expects to be discharged to:: Private residence Living Arrangements: Spouse/significant other Available Help at Discharge: Family;Available 24 hours/day Type of Home: House Home Access: Stairs to  enter [employer] of Steps: several Entrance Stairs-Rails: Right;Left Home Layout: Two level;Bed/bath upstairs Alternate Level Stairs-Number of Steps: flight Alternate Level Stairs-Rails: Right;Left Bathroom Shower/Tub: Teacher, early years/pre: Standard     Home Equipment:  (access to equipment)          Prior Functioning/Environment Level of Independence: Independent                 OT Problem List: Impaired sensation      OT Treatment/Interventions:      OT Goals(Current goals can be found in the care plan section) Acute Rehab OT Goals Patient Stated Goal: to get home OT Goal Formulation: With patient/family Time For Goal Achievement: 06/04/17 Potential to Achieve Goals: Good  OT Frequency:     Barriers to D/C:            Co-evaluation              AM-PAC PT "6 Clicks" Daily Activity     Outcome Measure Help from another person eating meals?: A Little Help from another person taking care of personal grooming?: A Little Help from another person toileting, which includes using toliet, bedpan, or urinal?: None Help from another person bathing (including washing, rinsing, drying)?: None Help from another person to put on and taking off regular upper body clothing?: None Help from another person to put on and taking off regular lower body clothing?: None 6 Click Score: 22   End of Session Equipment Utilized During Treatment: Gait belt Nurse Communication: Mobility status  Activity Tolerance: Patient tolerated treatment well Patient left: in chair;with call bell/phone within reach;with family/visitor present (with MD entering the room)  OT Visit Diagnosis: Other symptoms and signs involving the nervous system (R29.898)                Time: 3810-1751 OT Time Calculation (min): 15 min Charges:  OT General Charges $OT Visit: 1 Procedure OT Evaluation $OT Eval Low Complexity: 1 Procedure G-Codes: OT G-codes **NOT FOR INPATIENT  CLASS** Functional Assessment Tool Used: Clinical judgement Functional Limitation: Self care Self Care Current Status (W2585): At least 1 percent but less than 20 percent impaired, limited or restricted Self Care Goal Status (I7782): 0 percent impaired, limited or restricted Self Care Discharge Status [phone]): At least 1 percent but less than 20 percent impaired, limited or restricted   Hulda Humphrey OTR/L [place] 05/21/2017, 11:53 AM

## 2017-05-21 NOTE — Progress Notes (Signed)
Discharge instructions reviewed with patient/family. All questions answered at this time. Transport home by family.   Charika Mikelson, RN 

## 2017-05-21 NOTE — Progress Notes (Signed)
STROKE TEAM PROGRESS NOTE   HISTORY OF PRESENT ILLNESS (per record) [name] is a 66 y.o. female with a history of bilateral breast cancer, non-ischemic cardiomyopathy secondary to chemotherapy, systolic HFrEF (44%) s/p biventricular AICD, and obstructive sleep apnea.  She presented to the ED 05/21/2017 with a chief complaint of right handgrip weakness, changed sensation to right side of mouth and speech change. LKN was 1945. Her CBG was 127 and BP was 122/70.  These symptoms were preceded by a sensation of lightheadedness. At time of Neurology evaluation, her symptoms had resolved with the exception of subtle dysarthria with a lisping quality.  She has systolic CHF. TTE from 08/07/16 revealed a moderately dilated LV with diffuse hypokinesis and EF of 20%. Moderate diastolic dysfunction with evidence for elevated LV filling pressure. Normal RV size and systolic function. Moderate centralMR, likely functional. Mild aortic stenosis and aortic regurgitation. Despite evidence for elevated LV filling pressure, the IVC was small/non-dilated. LSN: 1945 on 05/21/2017  Patient was not administered IV t-PA secondary to NIHSS of 0. She was admitted to General Neurology for further evaluation and treatment.   SUBJECTIVE (INTERVAL HISTORY) Her family is at the bedside.  The patient is alert, oriented, and follows all commands appropriately.  Interrogation of pacemaker at her cardiologist on 05/07/2017 noted an 18-beat run of SVT.  Consider anticoagulation if atrial fibrillation is present on interrogation ordered today.  Currently on Whiteside heparin.  Subtherapeutic digoxin level of 0.3 today.  Echo pending.   OBJECTIVE Temp:  [97.7 F (36.5 C)-98.9 F (37.2 C)] 97.8 F (36.6 C) (06/28 1300) Pulse Rate:  [76-89] 85 (06/28 1300) Cardiac Rhythm: Ventricular paced (06/28 0700) Resp:  [12-20] 16 (06/28 1300) BP: (101-129)/(44-89) 114/48 (06/28 1300) SpO2:  [87 %-100 %] 87 % (06/28 1300) Weight:  [83.9 kg  (184 lb 15.5 oz)] 83.9 kg (184 lb 15.5 oz) (06/28 0100)  CBC:  Recent Labs Lab 05/20/17 2042 05/20/17 2046  WBC 10.4  --   NEUTROABS 5.7  --   HGB 13.5 13.6  HCT 40.3 40.0  MCV 91.4  --   PLT 244  --     Basic Metabolic Panel:  Recent Labs Lab 05/20/17 2042 05/20/17 2046 05/21/17 0301  NA 134* 138  --   K 3.9 3.9  --   CL 103 101  --   CO2 23  --   --   GLUCOSE 122* 123*  --   BUN 16 18  --   CREATININE 0.93 1.00  --   CALCIUM 9.7  --   --   MG  --   --  2.2    Lipid Panel:    Component Value Date/Time   CHOL 201 (H) 05/21/2017 0301   TRIG 179 (H) 05/21/2017 0301   HDL 39 (L) 05/21/2017 0301   CHOLHDL 5.2 05/21/2017 0301   VLDL 36 05/21/2017 0301   LDLCALC 126 (H) 05/21/2017 0301   HgbA1c: No results found for: HGBA1C Urine Drug Screen: No results found for: LABOPIA, COCAINSCRNUR, LABBENZ, AMPHETMU, THCU, LABBARB  Alcohol Level No results found for: ETH  IMAGING  Ct Angio Head W and Wo Contrast 05/20/2017 IMPRESSION: 1. No emergent large vessel occlusion or high-grade stenosis. 2. No hemodynamically significant stenosis of the major cervical arteries.  Ct Head Code Stroke W/o Cm 05/20/2017 IMPRESSION: 1. Normal head CT. 2. ASPECTS is 10.    PHYSICAL EXAM pleasant middle aged Caucasian lady not in distress.  . Afebrile. Head is nontraumatic. Neck is supple  without bruit.    Cardiac exam no murmur or gallop. Lungs are clear to auscultation. Distal pulses are well felt. Neurological Exam :  Awake alert oriented x3. Mild dysarthria and word finding di Able to name and repeat quite well. Follows commands well. Extra ocular movements are full range  Vision acuity and fields seem adequat Mild right lower facial weakness. Tongue midline. Motor system exam reveals no upper of lower extremity drift. No focal weakness.slight diminished fine finger movements Orbits left over right upper extrem Deep tendon reflexes are symmetric. Plantars downgoing. Sensation intact.  Gait not tested. ASSESSMENT/PLAN [name] is a 66 y.o. female with history of  bilateral breast cancer, non-ischemic cardiomyopathy secondary to chemotherapy, systolic HFrEF (63%) s/p biventricular AICD, and obstructive sleep apnea presenting with right handgrip weakness, changed sensation to right side of mouth and speech change. She did not receive IV t-PA due to presenting with an NIHSS of 0.   Suspect small left brain infarct from suspected cardio embolic etiology Resultant  Mild dysarthria and right hand weakness  CT head: no stroke  MRI head: not ordered due to incompatible AICD  MRA head: not ordered due to incompatible AICD  2D Echo   pending   LDL 126  HgbA1c pending   SCDs for VTE prophylaxis  Diet Heart Room service appropriate? Yes; Fluid consistency: Thin  No antithrombotic prior to admission, now on aspirin 325 mg daily  Patient counseled to be compliant with her antithrombotic medications  Ongoing aggressive stroke risk factor management  Therapy recommendations: none  Disposition:  Pending    Hyperlipidemia  Home meds: none  LDL 126, goal < 70  Add atorvastatin 40mg  PO daily  Continue statin at discharge   Other Stroke Risk Factors  Advanced age  ETOH use, advised to drink no more than 1 drink(s) a day  Obesity, Body mass index is 31.75 kg/m., recommend weight loss, diet and exercise as appropriate   Hx cancer (bilateral breast)  Heart failure with EF of 20%  Obstructive sleep apnea, on CPAP at home  Other Active Problems  Subtherapeutic digoxin level of 0.3.   Hospital day # 0  I have personally examined this patient, reviewed notes, independently viewed imaging studies, participated in medical decision making and plan of care.ROS completed by me personally and pertinent positives fully documented  I have made any additions or clarifications directly to the above note. She presented with dysarthria and right hand  weakness likely do to suspected small left brain infarct etiology to be determined but likely cardioembolic from her cardiomyopathy. Start aspirin for now and check echocardiogram and pacemaker incurvation for A. Fib. Discussed with patient and family. Greater than 50% time during this  35 minute visit spent on counseling and coordination of care about stroke,, cardioembolic risk and answering questions Antony Contras, MD Medical Director [place] Pager: [phone] 05/21/2017 3:59 PM   To contact Stroke Continuity provider, please refer to [email or web address] After hours, contact General Neurology

## 2017-05-21 NOTE — Discharge Summary (Addendum)
Physician Discharge Summary  [name] MRN: [id number] DOB/AGE: 68-05-1951 68 y.o.  PCP: Colon Branch, MD   Admit date: 05/20/2017 Discharge date: 05/21/2017  Discharge Diagnoses:    Principal Problem:   TIA (transient ischemic attack) Active Problems:   Depression-insomnia   Chronic systolic heart failure ([place])    Follow-up recommendations Follow-up with PCP in 3-5 days , including all  additional recommended appointments as below Follow-up CBC, CMP in 3-5 days Performed review of 2-D echo results, - An apical thrombus can&'t be excluded, additional images with   echocontrast are recommended.      Current Discharge Medication List    START taking these medications   Details  aspirin 325 MG tablet Take 1 tablet (325 mg total) by mouth daily. Qty: 30 tablet, Refills: 1    atorvastatin (LIPITOR) 40 MG tablet Take 1 tablet (40 mg total) by mouth daily. Qty: 30 tablet, Refills: 11      CONTINUE these medications which have NOT CHANGED   Details  ascorbic acid (VITAMIN C) 1000 MG tablet Take 1,000 mg by mouth daily.    Biotin 5000 MCG CAPS Take 5,000 mcg by mouth daily.     carvedilol (COREG) 12.5 MG tablet Take 1 tablet (12.5 mg total) by mouth 2 (two) times daily with a meal. Qty: 180 tablet, Refills: 1    digoxin (LANOXIN) 0.125 MG tablet Take 1 tablet (125 mcg total) by mouth daily. Qty: 30 tablet, Refills: 6    ENTRESTO 24-26 MG TAKE 1 TABLET BY MOUTH 2 (TWO) TIMES DAILY. Qty: 60 tablet, Refills: 5    furosemide (LASIX) 40 MG tablet TAKE 2 TABLETS BY MOUTH EVERY MORNING AND 1 TABLET EVERY EVENING Qty: 270 tablet, Refills: 3    multivitamin (THERAGRAN) per tablet Take 1 tablet by mouth daily.     pantoprazole (PROTONIX) 40 MG tablet Take 1 tablet (40 mg total) by mouth daily. Qty: 90 tablet, Refills: 1    potassium chloride (K-DUR) 10 MEQ tablet TAKE 3 TABLETS (30 MEQ TOTAL) BY MOUTH DAILY. Qty: 270 tablet, Refills: 3    Probiotic Product  (PROBIOTIC DAILY PO) Take 1 capsule by mouth daily.     sertraline (ZOLOFT) 25 MG tablet Take 1 tablet (25 mg total) by mouth daily. Qty: 90 tablet, Refills: 1    spironolactone (ALDACTONE) 25 MG tablet Take 1 tablet (25 mg total) by mouth daily. Qty: 90 tablet, Refills: 3    vitamin B-12 (CYANOCOBALAMIN) 1000 MCG tablet Take 1,000 mcg by mouth daily.    zolpidem (AMBIEN) 10 MG tablet Take 1 tablet (10 mg total) by mouth at bedtime as needed for sleep. Qty: 90 tablet, Refills: 1    gabapentin (NEURONTIN) 300 MG capsule Take 300 mg by mouth daily as needed (for shingles flares).       STOP taking these medications     doxycycline (VIBRA-TABS) 100 MG tablet          Discharge Condition: stable   Discharge Instructions Get Medicines reviewed and adjusted: Please take all your medications with you for your next visit with your Primary MD  Please request your Primary MD to go over all hospital tests and procedure/radiological results at the follow up, please ask your Primary MD to get all Hospital records sent to his/her office.  If you experience worsening of your admission symptoms, develop shortness of breath, life threatening emergency, suicidal or homicidal thoughts you must seek medical attention immediately by calling 911 or calling your MD immediately  if symptoms less severe.  You must read complete instructions/literature along with all the possible adverse reactions/side effects for all the Medicines you take and that have been prescribed to you. Take any new Medicines after you have completely understood and accpet all the possible adverse reactions/side effects.   Do not drive when taking Pain medications.   Do not take more than prescribed Pain, Sleep and Anxiety Medications  Special Instructions: If you have smoked or chewed Tobacco in the last 2 yrs please stop smoking, stop any regular Alcohol and or any Recreational drug use.  Wear Seat belts while  driving.  Please note  You were cared for by a hospitalist during your hospital stay. Once you are discharged, your primary care physician will handle any further medical issues. Please note that NO REFILLS for any discharge medications will be authorized once you are discharged, as it is imperative that you return to your primary care physician (or establish a relationship with a primary care physician if you do not have one) for your aftercare needs so that they can reassess your need for medications and monitor your lab values.     Allergies  Allergen Reactions  . Atacand [Candesartan] Other (See Comments)    Causes fatigue   . Cephalexin Hives and Itching  . Latex Rash      Disposition: 01-Home or Self Care   Consults:  neurology    Significant Diagnostic Studies:  Ct Angio Head W Or Wo Contrast  Result Date: 05/20/2017 CLINICAL DATA:  Speech difficulties EXAM: CT ANGIOGRAPHY HEAD AND NECK TECHNIQUE: Multidetector CT imaging of the head and neck was performed using the standard protocol during bolus administration of intravenous contrast. Multiplanar CT image reconstructions and MIPs were obtained to evaluate the vascular anatomy. Carotid stenosis measurements (when applicable) are obtained utilizing NASCET criteria, using the distal internal carotid diameter as the denominator. CONTRAST:  50 mL Isovue 370 COMPARISON:  None. FINDINGS: CTA NECK FINDINGS Aortic arch: There is no aneurysm or dissection of the visualized ascending aorta or aortic arch. There is an aberrant right subclavian artery. The visualized proximal subclavian arteries are normal. Right carotid system: The right common carotid origin is widely patent. There is no common carotid or internal carotid artery dissection or aneurysm. No hemodynamically significant stenosis. Left carotid system: The left common carotid origin is widely patent. There is no common carotid or internal carotid artery dissection or aneurysm.  No hemodynamically significant stenosis. Vertebral arteries: The vertebral system is left dominant. Both vertebral artery origins are normal. The right vertebral artery is diminutive along its entire course, likely congenital. It terminates in the right posterior inferior cerebellar artery. Skeleton: There is no bony spinal canal stenosis. No lytic or blastic lesions. Other neck: The nasopharynx is clear. The oropharynx and hypopharynx are normal. The epiglottis is normal. The supraglottic larynx, glottis and subglottic larynx are normal. No retropharyngeal collection. The parapharyngeal spaces are preserved. The parotid and submandibular glands are normal. No sialolithiasis or salivary ductal dilatation. The thyroid gland is normal. There is no cervical lymphadenopathy. Upper chest: No pneumothorax or pleural effusion. No nodules or masses. Review of the MIP images confirms the above findings CTA HEAD FINDINGS Anterior circulation: --Intracranial internal carotid arteries: Normal. --Anterior cerebral arteries: Normal. Congenitally hypoplastic right A1 segment. --Middle cerebral arteries: Normal. --Posterior communicating arteries: Present bilaterally. Posterior circulation: --Posterior cerebral arteries: Normal. --Superior cerebellar arteries: Normal. --Basilar artery: Diminutive, likely owing to the presence of bilateral posterior communicating arteries. --Anterior inferior cerebellar arteries:  Normal. --Posterior inferior cerebellar arteries: Normal. Venous sinuses: As permitted by contrast timing, patent. Anatomic variants: Diminutive right vertebral artery terminates in the posterior inferior cerebellar artery. Aberrant origin of the right subclavian artery from the descending thoracic aorta. Delayed phase: No parenchymal contrast enhancement. Review of the MIP images confirms the above findings IMPRESSION: 1. No emergent large vessel occlusion or high-grade stenosis. 2. No hemodynamically significant stenosis  of the major cervical arteries. These results were called by telephone at the time of interpretation on 05/20/2017 at 9:12 pm to Dr. Kerney Elbe , who verbally acknowledged these results. Electronically Signed   By: Ulyses Jarred M.D.   On: 05/20/2017 21:33   Ct Angio Neck W Or Wo Contrast  Result Date: 05/20/2017 CLINICAL DATA:  Speech difficulties EXAM: CT ANGIOGRAPHY HEAD AND NECK TECHNIQUE: Multidetector CT imaging of the head and neck was performed using the standard protocol during bolus administration of intravenous contrast. Multiplanar CT image reconstructions and MIPs were obtained to evaluate the vascular anatomy. Carotid stenosis measurements (when applicable) are obtained utilizing NASCET criteria, using the distal internal carotid diameter as the denominator. CONTRAST:  50 mL Isovue 370 COMPARISON:  None. FINDINGS: CTA NECK FINDINGS Aortic arch: There is no aneurysm or dissection of the visualized ascending aorta or aortic arch. There is an aberrant right subclavian artery. The visualized proximal subclavian arteries are normal. Right carotid system: The right common carotid origin is widely patent. There is no common carotid or internal carotid artery dissection or aneurysm. No hemodynamically significant stenosis. Left carotid system: The left common carotid origin is widely patent. There is no common carotid or internal carotid artery dissection or aneurysm. No hemodynamically significant stenosis. Vertebral arteries: The vertebral system is left dominant. Both vertebral artery origins are normal. The right vertebral artery is diminutive along its entire course, likely congenital. It terminates in the right posterior inferior cerebellar artery. Skeleton: There is no bony spinal canal stenosis. No lytic or blastic lesions. Other neck: The nasopharynx is clear. The oropharynx and hypopharynx are normal. The epiglottis is normal. The supraglottic larynx, glottis and subglottic larynx are normal. No  retropharyngeal collection. The parapharyngeal spaces are preserved. The parotid and submandibular glands are normal. No sialolithiasis or salivary ductal dilatation. The thyroid gland is normal. There is no cervical lymphadenopathy. Upper chest: No pneumothorax or pleural effusion. No nodules or masses. Review of the MIP images confirms the above findings CTA HEAD FINDINGS Anterior circulation: --Intracranial internal carotid arteries: Normal. --Anterior cerebral arteries: Normal. Congenitally hypoplastic right A1 segment. --Middle cerebral arteries: Normal. --Posterior communicating arteries: Present bilaterally. Posterior circulation: --Posterior cerebral arteries: Normal. --Superior cerebellar arteries: Normal. --Basilar artery: Diminutive, likely owing to the presence of bilateral posterior communicating arteries. --Anterior inferior cerebellar arteries: Normal. --Posterior inferior cerebellar arteries: Normal. Venous sinuses: As permitted by contrast timing, patent. Anatomic variants: Diminutive right vertebral artery terminates in the posterior inferior cerebellar artery. Aberrant origin of the right subclavian artery from the descending thoracic aorta. Delayed phase: No parenchymal contrast enhancement. Review of the MIP images confirms the above findings IMPRESSION: 1. No emergent large vessel occlusion or high-grade stenosis. 2. No hemodynamically significant stenosis of the major cervical arteries. These results were called by telephone at the time of interpretation on 05/20/2017 at 9:12 pm to Dr. Kerney Elbe , who verbally acknowledged these results. Electronically Signed   By: Ulyses Jarred M.D.   On: 05/20/2017 21:33   Ct Head Code Stroke W/o Cm  Result Date: 05/20/2017 CLINICAL DATA:  Code stroke.  Slurred speech and facial droop EXAM: CT HEAD WITHOUT CONTRAST TECHNIQUE: Contiguous axial images were obtained from the base of the skull through the vertex without intravenous contrast. COMPARISON:   Head CT 03/06/2010 FINDINGS: Brain: No mass lesion or acute hemorrhage. No focal hypoattenuation of the basal ganglia or cortex to indicate infarcted tissue. No hydrocephalus or age advanced atrophy. Vascular: No hyperdense vessel. No advanced atherosclerotic calcification of the arteries at the skull base. Skull: Normal visualized skull base, calvarium and extracranial soft tissues. Sinuses/Orbits: No sinus fluid levels or advanced mucosal thickening. No mastoid effusion. Normal orbits. ASPECTS [facility] Stroke Program Early CT Score) - Ganglionic level infarction (caudate, lentiform nuclei, internal capsule, insula, M1-M3 cortex): 7 - Supraganglionic infarction (M4-M6 cortex): 3 Total score (0-10 with 10 being normal): 10 IMPRESSION: 1. Normal head CT. 2. ASPECTS is 10. Dr.  Kerney Elbe was paged at 9:02 p.m. Electronically Signed   By: Ulyses Jarred M.D.   On: 05/20/2017 21:03    echocardiogram       Filed Weights   05/21/17 0100  Weight: 83.9 kg (184 lb 15.5 oz)       Labs: Results for orders placed or performed during the hospital encounter of 05/20/17 (from the past 48 hour(s))  Glucose, capillary     Status: Abnormal   Collection Time: 05/20/17  8:39 PM  Result Value Ref Range   Glucose-Capillary 127 (H) 65 - 99 mg/dL  Protime-INR     Status: None   Collection Time: 05/20/17  8:42 PM  Result Value Ref Range   Prothrombin Time 13.8 11.4 - 15.2 seconds   INR 1.06   APTT     Status: None   Collection Time: 05/20/17  8:42 PM  Result Value Ref Range   aPTT 34 24 - 36 seconds  CBC     Status: None   Collection Time: 05/20/17  8:42 PM  Result Value Ref Range   WBC 10.4 4.0 - 10.5 K/uL   RBC 4.41 3.87 - 5.11 MIL/uL   Hemoglobin 13.5 12.0 - 15.0 g/dL   HCT 40.3 36.0 - 46.0 %   MCV 91.4 78.0 - 100.0 fL   MCH 30.6 26.0 - 34.0 pg   MCHC 33.5 30.0 - 36.0 g/dL   RDW 12.8 11.5 - 15.5 %   Platelets 244 150 - 400 K/uL  Differential     Status: None   Collection Time: 05/20/17  8:42  PM  Result Value Ref Range   Neutrophils Relative % 54 %   Lymphocytes Relative 34 %   Monocytes Relative 9 %   Eosinophils Relative 3 %   Basophils Relative 0 %   Neutro Abs 5.7 1.7 - 7.7 K/uL   Lymphs Abs 3.5 0.7 - 4.0 K/uL   Monocytes Absolute 0.9 0.1 - 1.0 K/uL   Eosinophils Absolute 0.3 0.0 - 0.7 K/uL   Basophils Absolute 0.0 0.0 - 0.1 K/uL   Smear Review MORPHOLOGY UNREMARKABLE   Comprehensive metabolic panel     Status: Abnormal   Collection Time: 05/20/17  8:42 PM  Result Value Ref Range   Sodium 134 (L) 135 - 145 mmol/L   Potassium 3.9 3.5 - 5.1 mmol/L   Chloride 103 101 - 111 mmol/L   CO2 23 22 - 32 mmol/L   Glucose, Bld 122 (H) 65 - 99 mg/dL   BUN 16 6 - 20 mg/dL   Creatinine, Ser 0.93 0.44 - 1.00 mg/dL   Calcium 9.7 8.9 - 10.3 mg/dL  Total Protein 7.0 6.5 - 8.1 g/dL   Albumin 3.9 3.5 - 5.0 g/dL   AST 23 15 - 41 U/L   ALT 21 14 - 54 U/L   Alkaline Phosphatase 69 38 - 126 U/L   Total Bilirubin 0.5 0.3 - 1.2 mg/dL   GFR calc non Af Amer >60 >60 mL/min   GFR calc Af Amer >60 >60 mL/min    Comment: (NOTE) The eGFR has been calculated using the CKD EPI equation. This calculation has not been validated in all clinical situations. eGFR's persistently <60 mL/min signify possible Chronic Kidney Disease.    Anion gap 8 5 - 15  POCT i-Stat troponin I     Status: None   Collection Time: 05/20/17  8:44 PM  Result Value Ref Range   Troponin i, poc 0.01 0.00 - 0.08 ng/mL   Comment 3            Comment: Due to the release kinetics of cTnI, a negative result within the first hours of the onset of symptoms does not rule out myocardial infarction with certainty. If myocardial infarction is still suspected, repeat the test at appropriate intervals.   I-Stat Chem 8, ED     Status: Abnormal   Collection Time: 05/20/17  8:46 PM  Result Value Ref Range   Sodium 138 135 - 145 mmol/L   Potassium 3.9 3.5 - 5.1 mmol/L   Chloride 101 101 - 111 mmol/L   BUN 18 6 - 20 mg/dL    Creatinine, Ser 1.00 0.44 - 1.00 mg/dL   Glucose, Bld 123 (H) 65 - 99 mg/dL   Calcium, Ion 1.14 (L) 1.15 - 1.40 mmol/L   TCO2 24 0 - 100 mmol/L   Hemoglobin 13.6 12.0 - 15.0 g/dL   HCT 40.0 36.0 - 46.0 %  Digoxin level     Status: Abnormal   Collection Time: 05/20/17 10:35 PM  Result Value Ref Range   Digoxin Level 0.3 (L) 0.8 - 2.0 ng/mL  Lipid panel     Status: Abnormal   Collection Time: 05/21/17  3:01 AM  Result Value Ref Range   Cholesterol 201 (H) 0 - 200 mg/dL   Triglycerides 179 (H) <150 mg/dL   HDL 39 (L) >40 mg/dL   Total CHOL/HDL Ratio 5.2 RATIO   VLDL 36 0 - 40 mg/dL   LDL Cholesterol 126 (H) 0 - 99 mg/dL    Comment:        Total Cholesterol/HDL:CHD Risk Coronary Heart Disease Risk Table                     Men   Women  1/2 Average Risk   3.4   3.3  Average Risk       5.0   4.4  2 X Average Risk   9.6   7.1  3 X Average Risk  23.4   11.0        Use the calculated Patient Ratio above and the CHD Risk Table to determine the patient's CHD Risk.        ATP III CLASSIFICATION (LDL):  <100     mg/dL   Optimal  100-129  mg/dL   Near or Above                    Optimal  130-159  mg/dL   Borderline  160-189  mg/dL   High  >190     mg/dL   Very High  Magnesium     Status: None   Collection Time: 05/21/17  3:01 AM  Result Value Ref Range   Magnesium 2.2 1.7 - 2.4 mg/dL     Lipid Panel     Component Value Date/Time   CHOL 201 (H) 05/21/2017 0301   TRIG 179 (H) 05/21/2017 0301   HDL 39 (L) 05/21/2017 0301   CHOLHDL 5.2 05/21/2017 0301   VLDL 36 05/21/2017 0301   LDLCALC 126 (H) 05/21/2017 0301     No results found for: HGBA1C   Lab Results  Component Value Date   LDLCALC 126 (H) 05/21/2017   CREATININE 1.00 05/20/2017     HPI :  66 y.o.femalewith a history of bilateral breast cancer, non-ischemic cardiomyopathy secondary to chemotherapy, systolic HFrEF (88%) s/p biventricular AICD, and obstructive sleep apnea.  She presented to the ED 05/21/2017  with a chief complaint of right handgrip weakness, changed sensation to right side of mouth and speech change. LKN was 1945. Her CBG was 127 and BP was 122/70.  These symptoms were preceded by a sensation of lightheadedness. At time of Neurology evaluation, her symptoms had resolved with the exception of subtle dysarthria with a lisping quality.  She has systolic CHF. TTE from 08/07/16 revealed amoderately dilated LV with diffuse hypokinesis andEF of 20%.Interrogation of pacemaker at her cardiologist on 05/07/2017 noted an 18-beat run of SVT.  Consider anticoagulation if atrial fibrillation is present on interrogation ordered today  HOSPITAL COURSE:    TIA (transient ischemic attack) Telemetry showed paced rhythm, no evidence of A. fib She presented with dysarthria and right hand weakness likely suspected small left brain infarct etiology to be determined but likely cardioembolic from her cardiomyopathy, patient started on aspirin 325 mg a day, 2-D echo completed, results  made available at 7 PM Pacemaker interrogation did not show any A. fib Frequent neuro checks. Remained stable Consult PT, OT -no PT follow-up recommended Unable to perform MRIs due to presence of AICD. LDL 126, triglycerides 179, hemoglobin A1c pending Echo cannot rule out apical thrombus, additional images with echo contrast recommended, I have  Notified [facility] cardiology to arrange for this as soon as possible     Depression-insomnia. Continue sertraline 25 mg by mouth daily. Continue Ambien 5 mg by mouth at bedtime as needed.     Chronic systolic heart failure (HCC) Compensated. Entresto 24/26 mg po bid. carvedilol 12.5 mg by mouth twice a day. Continue the digoxin 125 g by mouth daily. furosemide 80 mg orally in a.m. and 40 mg in the evening Continue the spironolactone 25 mg by mouth daily.   Discharge Exam:  Blood pressure (!) 114/48, pulse 85, temperature 97.8 F (36.6 C), temperature source Oral, resp. rate  16, height _0  (1.626 m), weight 83.9 kg (184 lb 15.5 oz), SpO2 (!) 87 %.   Neck: normal, supple, no masses, no thyromegaly Respiratory: clear to auscultation bilaterally, no wheezing, no crackles. Normal respiratory effort. No accessory muscle use.  Cardiovascular: Regular rate and rhythm, Positive 3/6 systolic , no rubs / gallops. No extremity edema. 2+ pedal pulses. No carotid bruits.  Abdomen: Soft, no tenderness, no masses palpated. No hepatosplenomegaly. Bowel sounds positive.  Musculoskeletal: no clubbing / cyanosis. Good ROM, no contractures. Normal muscle tone.  Skin: no rashes, lesions, ulcers. No induration Neurologic: CN 2-12 grossly intact. The speech is mildly slurred. Sensation intact, DTR normal. Strength 5/5 in all 4.    Follow-up Information    Colon Branch, MD. Call.   Specialty:  Internal Medicine Why:  hospital follow up in 3-5 days Contact information: [place] [id number] [phone]           Signed: Reyne Dumas 05/21/2017, 1:56 PM        Time spent >1 hour

## 2017-05-21 NOTE — Progress Notes (Signed)
  Echocardiogram 2D Echocardiogram has been performed.  Jennette Dubin 05/21/2017, 3:03 PM

## 2017-05-22 ENCOUNTER — Telehealth: Payer: Self-pay | Admitting: Cardiology

## 2017-05-22 ENCOUNTER — Telehealth (HOSPITAL_COMMUNITY): Payer: Self-pay | Admitting: Surgery

## 2017-05-22 ENCOUNTER — Encounter: Payer: Medicare Other | Admitting: Internal Medicine

## 2017-05-22 ENCOUNTER — Telehealth: Payer: Self-pay | Admitting: Behavioral Health

## 2017-05-22 LAB — HEMOGLOBIN A1C
Hgb A1c MFr Bld: 5.9 % — ABNORMAL HIGH (ref 4.8–5.6)
Mean Plasma Glucose: 123 mg/dL

## 2017-05-22 NOTE — Telephone Encounter (Signed)
Remote Reviewed. 1 atrial flutter episode <30sec. Msg sent to Balinda Quails and [name].

## 2017-05-22 NOTE — Telephone Encounter (Signed)
Spoke w/ pt and informed her that NP wanted her send a remote transmission. Pt is going to do this now. NP is looking for Atrial Fib.

## 2017-05-22 NOTE — Telephone Encounter (Signed)
I received a referral for patient for AHF Clinic that was sent to the [place] clinic in error.  I sent patient information to clinic coordinator and scheduler to get an appointment at [facility].

## 2017-05-22 NOTE — Telephone Encounter (Signed)
-----   Message from Patsey Berthold, NP sent at 05/22/2017  8:09 AM EDT ----- Regarding: RE: Request [name],  Looks like patient is discharged. Please call her and have her send remote transmission - looking for AF  Thanks, Museum/gallery conservator  ----- Message ----- From: Charm Rings, NP Sent: 05/21/2017  12:41 PM To: Patsey Berthold, NP Subject: Request                                        Hello Amber-  I am an NP working with [name] on the Stroke team.  Could you please interrogate the pacemaker of [name] (MRN [id number])?  We are looking for a-fib as a potential cause of her recent stroke.  Thank you,  Liane Comber [phone]

## 2017-05-22 NOTE — Telephone Encounter (Signed)
Transition Care Management Follow-up Telephone Call  PCP: Colon Branch, MD   Admit date: 05/20/2017 Discharge date: 05/21/2017  Discharge Diagnoses:    Principal Problem:   TIA (transient ischemic attack) Active Problems:   Depression-insomnia   Chronic systolic heart failure ([place])    Follow-up recommendations Follow-up with PCP in 3-5 days , including all  additional recommended appointments as below Follow-up CBC, CMP in 3-5 days    How have you been since you were released from the hospital? Patient stated, "I'm feeling good, but a little tired".   Do you understand why you were in the hospital? yes   Do you understand the discharge instructions? yes   Where were you discharged to? Home with spouse.   Items Reviewed:  Medications reviewed: yes  Allergies reviewed: yes  Dietary changes reviewed: yes, heart healthy, low sodium diet  Referrals reviewed: yes, Follow-up with PCP in 3-5 days, including all additional recommended appointments as below; Follow-up CBC, CMP in 3-5 days    Functional Questionnaire:   Activities of Daily Living (ADLs):   She states they are independent in the following: ambulation, bathing and hygiene, feeding, continence, grooming, toileting and dressing States they require assistance with the following: None   Any transportation issues/concerns?: no   Any patient concerns? no   Confirmed importance and date/time of follow-up visits scheduled yes, 06/01/17 at 11:30 AM.  Provider Appointment booked with Dr. Larose Kells.  Confirmed with patient if condition begins to worsen call PCP or go to the ER.  Patient was given the office number and encouraged to call back with question or concerns.  : yes

## 2017-05-22 NOTE — Care Management Note (Signed)
Case Management Note  Patient Details  Name: [name] MRN: [id number] Date of Birth: 1951-08-16  Subjective/Objective:                    Action/Plan: Pt discharged home with self care. Pt has PCP, insurance and transportation home. No further needs per CM.   Expected Discharge Date:  05/21/17               Expected Discharge Plan:  Home/Self Care  In-House Referral:     Discharge planning Services     Post Acute Care Choice:    Choice offered to:     DME Arranged:    DME Agency:     HH Arranged:    HH Agency:     Status of Service:  Completed, signed off  If discussed at [employer] of Stay Meetings, dates discussed:    Additional Comments:  Pollie Friar, RN 05/22/2017, 9:13 AM

## 2017-06-01 ENCOUNTER — Ambulatory Visit (INDEPENDENT_AMBULATORY_CARE_PROVIDER_SITE_OTHER): Payer: Medicare Other | Admitting: Internal Medicine

## 2017-06-01 ENCOUNTER — Encounter: Payer: Self-pay | Admitting: Internal Medicine

## 2017-06-01 VITALS — BP 122/78 | HR 78 | Temp 98.2°F | Resp 14 | Ht 64.0 in | Wt 185.4 lb

## 2017-06-01 DIAGNOSIS — Z09 Encounter for follow-up examination after completed treatment for conditions other than malignant neoplasm: Secondary | ICD-10-CM | POA: Diagnosis not present

## 2017-06-01 DIAGNOSIS — G459 Transient cerebral ischemic attack, unspecified: Secondary | ICD-10-CM | POA: Diagnosis not present

## 2017-06-01 DIAGNOSIS — I5022 Chronic systolic (congestive) heart failure: Secondary | ICD-10-CM

## 2017-06-01 DIAGNOSIS — G450 Vertebro-basilar artery syndrome: Secondary | ICD-10-CM

## 2017-06-01 DIAGNOSIS — E785 Hyperlipidemia, unspecified: Secondary | ICD-10-CM

## 2017-06-01 LAB — CBC WITH DIFFERENTIAL/PLATELET
BASOS ABS: 0 10*3/uL (ref 0.0–0.1)
BASOS PCT: 0.6 % (ref 0.0–3.0)
EOS PCT: 3.7 % (ref 0.0–5.0)
Eosinophils Absolute: 0.3 10*3/uL (ref 0.0–0.7)
HEMATOCRIT: 40 % (ref 36.0–46.0)
Hemoglobin: 13.6 g/dL (ref 12.0–15.0)
LYMPHS ABS: 2.2 10*3/uL (ref 0.7–4.0)
LYMPHS PCT: 29.6 % (ref 12.0–46.0)
MCHC: 34.1 g/dL (ref 30.0–36.0)
MCV: 91 fl (ref 78.0–100.0)
MONOS PCT: 10.5 % (ref 3.0–12.0)
Monocytes Absolute: 0.8 10*3/uL (ref 0.1–1.0)
NEUTROS ABS: 4.1 10*3/uL (ref 1.4–7.7)
Neutrophils Relative %: 55.6 % (ref 43.0–77.0)
PLATELETS: 265 10*3/uL (ref 150.0–400.0)
RBC: 4.39 Mil/uL (ref 3.87–5.11)
RDW: 12.8 % (ref 11.5–15.5)
WBC: 7.4 10*3/uL (ref 4.0–10.5)

## 2017-06-01 LAB — COMPREHENSIVE METABOLIC PANEL
ALT: 20 U/L (ref 0–35)
AST: 20 U/L (ref 0–37)
Albumin: 4.1 g/dL (ref 3.5–5.2)
Alkaline Phosphatase: 68 U/L (ref 39–117)
BUN: 15 mg/dL (ref 6–23)
CALCIUM: 9.8 mg/dL (ref 8.4–10.5)
CHLORIDE: 103 meq/L (ref 96–112)
CO2: 29 meq/L (ref 19–32)
Creatinine, Ser: 0.89 mg/dL (ref 0.40–1.20)
GFR: 67.04 mL/min (ref >60.00)
GLUCOSE: 117 mg/dL — AB (ref 70–99)
POTASSIUM: 4.1 meq/L (ref 3.5–5.1)
Sodium: 137 mEq/L (ref 135–145)
Total Bilirubin: 0.3 mg/dL (ref 0.2–1.2)
Total Protein: 7.3 g/dL (ref 6.0–8.3)

## 2017-06-01 NOTE — Progress Notes (Addendum)
Subjective:    Patient ID: [name], female    DOB: 01-21-1951, 66 y.o.   MRN: [id number]  DOS:  06/01/2017 Type of visit - description : TCM 14 Interval history: Admitted to the hospital with right-sided weakness, change sensation R from the mouth and speech changes. Workup reviewed. CTA head and neck with no major large arteries narrowing. Unable to do MRIs d/t AICD Echocardiogram:  Apical thrombos cannot be rule out Saw neurology, DX with TIA, started aspirin, atorvastatin. Symptoms felt to be cardioembolic from cardiomyopathy. No A. fib noted during the admission.  Chronic medical problems were stable  Review of Systems  Since she left the hospital she is feeling well. Good compliance with medications. No apparent side effects. Denies any nausea, vomiting, blood in the stool or abdominal pain. No unusual aches or pains. No headaches  Past Medical History:  Diagnosis Date  . Breast CA ([place])    twice first on L breast 1982 w/ chest wall involvment, then a second breast ca on the R in 1987  . Chronic systolic CHF (congestive heart failure) ([place])    a. due to Adriamycin,s/p ICD;  b. 01/2013 Gen change and new LV lead - BSX Energen CRT-D BiV ICD, Ser # [id number]  . Depression   . Diverticulitis   . Early menopause    early 34s  . Fatigue 02/20/2016  . Glaucoma suspect of both eyes    Dr. Marshall Cork  . Nonischemic cardiomyopathy ([place])   . OSA (obstructive sleep apnea) 02/07/2016  . Osteopenia   . Renal calculus   . Snoring 02/20/2016    Past Surgical History:  Procedure Laterality Date  . BI-VENTRICULAR IMPLANTABLE CARDIOVERTER DEFIBRILLATOR UPGRADE N/A 02/04/2013   Procedure: BI-VENTRICULAR IMPLANTABLE CARDIOVERTER DEFIBRILLATOR UPGRADE;  Surgeon: Evans Lance, MD;  Location: [facility] CATH LAB;  Service: Cardiovascular;  Laterality: N/A;  . CARDIAC DEFIBRILLATOR PLACEMENT     AICD Replaced-5/09 and 2014  . LEFT AND RIGHT HEART CATHETERIZATION WITH CORONARY  ANGIOGRAM N/A 10/06/2013   Procedure: LEFT AND RIGHT HEART CATHETERIZATION WITH CORONARY ANGIOGRAM;  Surgeon: Jolaine Artist, MD;  Location: [facility] CATH LAB;  Service: Cardiovascular;  Laterality: N/A;  . LITHOTRIPSY     h/o several procedures   . MASTECTOMY     B  . OOPHORECTOMY     B, per genticists advise at [place]  . VENOGRAM N/A 10/20/2012   Procedure: VENOGRAM;  Surgeon: Deboraha Sprang, MD;  Location: [facility] CATH LAB;  Service: Cardiovascular;  Laterality: N/A;    Social History   Social History  . Marital status: Married    Spouse name: N/A  . Number of children: 3  . Years of education: N/A   Occupational History  . 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 Smoking status: Never Smoker  . Smokeless tobacco: Never Used  . Alcohol use Yes     Comment: socially/occassionally  . Drug use: No  . Sexual activity: Not on file   Other Topics Concern  . Not on file   Social History Narrative   Related to Mr and Mrs Huston Foley, they are my patients as well    Married, 3 children all in [place], 7 Gkids            Allergies as of 06/01/2017      Reactions   Atacand [candesartan] Other (See Comments)   Causes fatigue   Cephalexin Hives, Itching  Latex Rash      Medication List       Accurate as of 06/01/17 11:59 PM. Always use your most recent med list.          ascorbic acid 1000 MG tablet Commonly known as:  VITAMIN C Take 1,000 mg by mouth daily.   aspirin 325 MG tablet Take 1 tablet (325 mg total) by mouth daily.   atorvastatin 40 MG tablet Commonly known as:  LIPITOR Take 1 tablet (40 mg total) by mouth daily.   Biotin 5000 MCG Caps Take 5,000 mcg by mouth daily.   carvedilol 12.5 MG tablet Commonly known as:  COREG Take 1 tablet (12.5 mg total) by mouth 2 (two) times daily with a meal.   digoxin 0.125 MG tablet Commonly known as:  LANOXIN Take 1 tablet (125 mcg total) by mouth daily.   furosemide  40 MG tablet Commonly known as:  LASIX TAKE 2 TABLETS BY MOUTH EVERY MORNING AND 1 TABLET EVERY EVENING   gabapentin 300 MG capsule Commonly known as:  NEURONTIN Take 300 mg by mouth daily as needed (for shingles flares).   multivitamin per tablet Take 1 tablet by mouth daily.   pantoprazole 40 MG tablet Commonly known as:  PROTONIX Take 1 tablet (40 mg total) by mouth daily.   potassium chloride 10 MEQ tablet Commonly known as:  K-DUR TAKE 3 TABLETS (30 MEQ TOTAL) BY MOUTH DAILY.   PROBIOTIC DAILY PO Take 1 capsule by mouth daily.   sacubitril-valsartan 24-26 MG Commonly known as:  ENTRESTO Take 1 tablet by mouth 2 (two) times daily.   sertraline 25 MG tablet Commonly known as:  ZOLOFT Take 1 tablet (25 mg total) by mouth daily.   spironolactone 25 MG tablet Commonly known as:  ALDACTONE Take 1 tablet (25 mg total) by mouth daily.   vitamin B-12 1000 MCG tablet Commonly known as:  CYANOCOBALAMIN Take 1,000 mcg by mouth daily.   zolpidem 10 MG tablet Commonly known as:  AMBIEN Take 1 tablet (10 mg total) by mouth at bedtime as needed for sleep.          Objective:   Physical Exam BP 122/78 (BP Location: Left Arm, Patient Position: Sitting, Cuff Size: Normal)   Pulse 78   Temp 98.2 F (36.8 C) (Oral)   Resp 14   Ht 5\' 4"  (1.626 m)   Wt 185 lb 6 oz (84.1 kg)   SpO2 97%   BMI 31.82 kg/m  General:   Well developed, well nourished . NAD.  HEENT:  Normocephalic . Face symmetric, atraumatic Lungs:  CTA B Normal respiratory effort, no intercostal retractions, no accessory muscle use. Heart: RRR,  + syst murmur.  No pretibial edema bilaterally  Skin: Not pale. Not jaundice Neurologic:  alert & oriented X3.  Speech normal, gait appropriate for age and unassisted Psych--  Cognition and judgment appear intact.  Cooperative with normal attention span and concentration.  Behavior appropriate. No anxious or depressed appearing.      Assessment & Plan:    Assessment CV:Dr. Caryl Comes and [place] --CHF, nonischemic cardiomyopathy:due to chemotherapy --MR severe --VT Depression Mild OSA per sleep study 11-2015, saw Dr Radford Pax 02-20-16: no need for CPAP Osteopenia-- DEXA 06/13/2015 normal Vitamin D deficiency Kidney stones Menopause Breast cancer x 2: first in 1982 with chest wall involvement, 2nd on the R dx  1987, s/p B mastectomy Handicap sticker signed 06-2016 Post herpetic neuralgia (shingles 2014) , R face-- gaba prn  PLAN: TIA: Hospital  records reviewed, sx c/w TIA, she is back to baseline. She was prescribed and is tolerating  ASA 325 and lipitor 40 mg. Check a CMP, CBC Will contact neuro regards the echocardiogram result  (apical thrombos cannot be rule out  ). Further eval? Addendum: Discuss with Dr. Leonie Man, her last echo was similar to the previous one, prior to the TIA she was not compliant with aspirin, he will recommend TEE or changing anticoagulation if she has further events while taking aspirin. Hyperlipidemia: LDL was 126 at the hospital, she was started on atorvastatin. Plan to check a FLP, AST, ALT in 5 weeks. RTC 07-2017 as a schedule

## 2017-06-01 NOTE — Progress Notes (Signed)
Pre visit review using our clinic review tool, if applicable. No additional management support is needed unless otherwise documented below in the visit note. 

## 2017-06-01 NOTE — Patient Instructions (Addendum)
GO TO THE LAB : Get the blood work     GO TO THE FRONT DESK Schedule labs to be done 5 weeks from today, fasting  See you in September

## 2017-06-02 NOTE — Assessment & Plan Note (Addendum)
TIA: Hospital records reviewed, sx c/w TIA, she is back to baseline. She was prescribed and is tolerating  ASA 325 and lipitor 40 mg. Check a CMP, CBC Will contact neuro regards the echocardiogram result  (apical thrombos cannot be rule out  ). Further eval? Addendum: Discuss with Dr. Leonie Man, her last echo was similar to the previous one, prior to the TIA she was not compliant with aspirin, he will recommend TEE or changing anticoagulation if she has further events while taking aspirin. Hyperlipidemia: LDL was 126 at the hospital, she was started on atorvastatin. Plan to check a FLP, AST, ALT in 5 weeks. RTC 07-2017 as a schedule

## 2017-06-03 ENCOUNTER — Telehealth (HOSPITAL_COMMUNITY): Payer: Self-pay | Admitting: Vascular Surgery

## 2017-06-03 NOTE — Telephone Encounter (Signed)
Left pt message to make f/u appt w/ DB in Sept

## 2017-06-19 ENCOUNTER — Telehealth (HOSPITAL_COMMUNITY): Payer: Self-pay | Admitting: Vascular Surgery

## 2017-06-19 NOTE — Telephone Encounter (Signed)
Left pt message to move appt time due t db not in the office that afternoon

## 2017-07-01 ENCOUNTER — Other Ambulatory Visit: Payer: Self-pay | Admitting: Internal Medicine

## 2017-07-01 NOTE — Telephone Encounter (Signed)
Ok 90, 1 refill

## 2017-07-01 NOTE — Telephone Encounter (Signed)
Pt is requesting refill on Ambien 10mg .  Last OV: 06/01/2017 Last Fill: 09/16/2016 #90 and 1RF UDS: Not needed per PCP  [place] Controlled substance database printed, no issues noted.  Please advise.

## 2017-07-01 NOTE — Telephone Encounter (Signed)
Rx printed, awaiting MD signature.  

## 2017-07-01 NOTE — Telephone Encounter (Signed)
Rx faxed to [facility] pharmacy.  

## 2017-07-06 ENCOUNTER — Other Ambulatory Visit (INDEPENDENT_AMBULATORY_CARE_PROVIDER_SITE_OTHER): Payer: Medicare Other

## 2017-07-06 DIAGNOSIS — E785 Hyperlipidemia, unspecified: Secondary | ICD-10-CM | POA: Diagnosis not present

## 2017-07-06 LAB — LIPID PANEL
CHOL/HDL RATIO: 2
Cholesterol: 93 mg/dL (ref 0–200)
HDL: 41.4 mg/dL (ref >39.00)
LDL CALC: 32 mg/dL (ref 0–99)
NonHDL: 51.1
TRIGLYCERIDES: 94 mg/dL (ref 0.0–149.0)
VLDL: 18.8 mg/dL (ref 0.0–40.0)

## 2017-07-06 LAB — AST: AST: 20 U/L (ref 0–37)

## 2017-07-06 LAB — ALT: ALT: 21 U/L (ref 0–35)

## 2017-07-09 DIAGNOSIS — H40013 Open angle with borderline findings, low risk, bilateral: Secondary | ICD-10-CM | POA: Diagnosis not present

## 2017-07-09 DIAGNOSIS — H2513 Age-related nuclear cataract, bilateral: Secondary | ICD-10-CM | POA: Diagnosis not present

## 2017-07-09 DIAGNOSIS — H25013 Cortical age-related cataract, bilateral: Secondary | ICD-10-CM | POA: Diagnosis not present

## 2017-07-09 DIAGNOSIS — H353131 Nonexudative age-related macular degeneration, bilateral, early dry stage: Secondary | ICD-10-CM | POA: Diagnosis not present

## 2017-07-13 ENCOUNTER — Ambulatory Visit (INDEPENDENT_AMBULATORY_CARE_PROVIDER_SITE_OTHER): Payer: Medicare Other | Admitting: *Deleted

## 2017-07-13 DIAGNOSIS — I428 Other cardiomyopathies: Secondary | ICD-10-CM

## 2017-07-15 NOTE — Progress Notes (Signed)
Remote ICD transmission.   

## 2017-07-23 LAB — CUP PACEART REMOTE DEVICE CHECK
Battery Remaining Longevity: 60 mo
Battery Remaining Percentage: 97 %
Brady Statistic RV Percent Paced: 100 %
Date Time Interrogation Session: 20180820041100
HIGH POWER IMPEDANCE MEASURED VALUE: 58 Ohm
Implantable Lead Implant Date: 20050701
Implantable Lead Location: 753860
Implantable Lead Model: 158
Implantable Lead Serial Number: 156416
Implantable Pulse Generator Implant Date: 20140314
Lead Channel Impedance Value: 784 Ohm
Lead Channel Pacing Threshold Amplitude: 0.7 V
Lead Channel Pacing Threshold Amplitude: 1.2 V
Lead Channel Pacing Threshold Amplitude: 1.5 V
Lead Channel Pacing Threshold Pulse Width: 0.4 ms
Lead Channel Pacing Threshold Pulse Width: 0.6 ms
Lead Channel Setting Pacing Amplitude: 2 V
Lead Channel Setting Sensing Sensitivity: 0.5 mV
Lead Channel Setting Sensing Sensitivity: 1 mV
MDC IDC LEAD IMPLANT DT: 20050701
MDC IDC LEAD IMPLANT DT: 20140314
MDC IDC LEAD LOCATION: 753858
MDC IDC LEAD LOCATION: 753859
MDC IDC MSMT LEADCHNL RA IMPEDANCE VALUE: 474 Ohm
MDC IDC MSMT LEADCHNL RV IMPEDANCE VALUE: 436 Ohm
MDC IDC MSMT LEADCHNL RV PACING THRESHOLD PULSEWIDTH: 0.6 ms
MDC IDC PG SERIAL: 111235
MDC IDC SET LEADCHNL LV PACING AMPLITUDE: 2.4 V
MDC IDC SET LEADCHNL LV PACING PULSEWIDTH: 0.6 ms
MDC IDC SET LEADCHNL RV PACING AMPLITUDE: 2.4 V
MDC IDC SET LEADCHNL RV PACING PULSEWIDTH: 0.6 ms
MDC IDC STAT BRADY RA PERCENT PACED: 0 %

## 2017-07-24 ENCOUNTER — Encounter: Payer: Self-pay | Admitting: Cardiology

## 2017-07-28 NOTE — Progress Notes (Signed)
Cardiology Office Note Date:  07/29/2017  Patient ID:  [name], [name] [birth year], MRN [id number] PCP:  Colon Branch, MD  Cardiologist:  Dr. Wynonia Lawman CHF: Dr. Haroldine Laws Electrophysiologist: Dr. Caryl Comes   Chief Complaint: planned device visit  History of Present Illness: [name] is a 66 y.o. female with history of NICM (?2/2 Andriamycin), CRT-D, chronic CHF (systolic), mild OSA, no CPAP, b/l breast cancer, 1982 , treated with chemo (including adriamycin)/XRT and L mastectomy. Had R breast cancer (unrelated) in 1987. Has been cancer free since.   She had a hospitalization in June w/TIA  She comes in today to be seen for Dr. Caryl Comes, last seen by him in June, mentioning pre-syncope w/palpitations noting only NSVT and suggested keeping a diary/calander to see if symptoms correlate with these.  In review of record, she saw Dr. Larose Kells in July who noted her echo findings and his noted "Discuss with Dr. Leonie Man, her last echo was similar to the previous one, prior to the TIA she was not compliant with aspirin, he will recommend TEE or changing anticoagulation if she has further events while taking aspirin".  I also note a device RN note mentioning that <30seconds of AFlutter were noted by remote and sent to neurology NP (who requested the interrogation).  She denies any CP, no near syncope or syncope.  She in the last couple months has felt transiently dizzy upon standing though this resolves quickly.  She is up 2 pounds by our scale though weighs daily and her home weights have been very steady, she does nt feel like she is retaining fluid.  She is active with cleaning houses, helps with yard work, push mows and feels like her exertional capacity is at her baseline.  Denies any near syncope or syncope.  She continues to feel quick/fleeting fast heart beats on/off, perhaps more often of late.  She reports these as going back years.  June/July electrolytes were OK, renal function as well.  Device  information: BSCi CRT-D, initially 2005 (RA/RV leads 2005), new LV lead and gen change 02/04/13  Past Medical History:  Diagnosis Date  . Breast CA ([place])    twice first on L breast 1982 w/ chest wall involvment, then a second breast ca on the R in 1987  . Chronic systolic CHF (congestive heart failure) ([place])    a. due to Adriamycin,s/p ICD;  b. 01/2013 Gen change and new LV lead - BSX Energen CRT-D BiV ICD, Ser # [id number]  . Depression   . Diverticulitis   . Early menopause    early 13s  . Fatigue 02/20/2016  . Glaucoma suspect of both eyes    Dr. Marshall Cork  . Nonischemic cardiomyopathy ([place])   . OSA (obstructive sleep apnea) 02/07/2016  . Osteopenia   . Renal calculus   . Snoring 02/20/2016    Past Surgical History:  Procedure Laterality Date  . BI-VENTRICULAR IMPLANTABLE CARDIOVERTER DEFIBRILLATOR UPGRADE N/A 02/04/2013   Procedure: BI-VENTRICULAR IMPLANTABLE CARDIOVERTER DEFIBRILLATOR UPGRADE;  Surgeon: Evans Lance, MD;  Location: [facility] CATH LAB;  Service: Cardiovascular;  Laterality: N/A;  . CARDIAC DEFIBRILLATOR PLACEMENT     AICD Replaced-5/09 and 2014  . LEFT AND RIGHT HEART CATHETERIZATION WITH CORONARY ANGIOGRAM N/A 10/06/2013   Procedure: LEFT AND RIGHT HEART CATHETERIZATION WITH CORONARY ANGIOGRAM;  Surgeon: Jolaine Artist, MD;  Location[facility] C[facility] LAB;  Service: Cardiovascular;  Laterality: N/A;  . LITHOTRIPSY     h/o several procedures   . MASTECTOMY  B  . OOPHORECTOMY     B, per genticists advise at [place]  . VENOGRAM N/A 10/20/2012   Procedure: VENOGRAM;  Surgeon: Deboraha Sprang, MD;  Location: [facility] CATH LAB;  Service: Cardiovascular;  Laterality: N/A;    Current Outpatient Prescriptions  Medication Sig Dispense Refill  . ascorbic acid (VITAMIN C) 1000 MG tablet Take 1,000 mg by mouth daily.    Marland Kitchen aspirin 325 MG tablet Take 1 tablet (325 mg total) by mouth daily. 30 tablet 1  . atorvastatin (LIPITOR) 40 MG tablet Take 1 tablet (40 mg total) by mouth  daily. 30 tablet 11  . Biotin 5000 MCG CAPS Take 5,000 mcg by mouth daily.     . carvedilol (COREG) 12.5 MG tablet Take 1 tablet (12.5 mg total) by mouth 2 (two) times daily with a meal. 180 tablet 1  . digoxin (LANOXIN) 0.125 MG tablet Take 1 tablet (125 mcg total) by mouth daily. 30 tablet 6  . furosemide (LASIX) 40 MG tablet TAKE 2 TABLETS BY MOUTH EVERY MORNING AND 1 TABLET EVERY EVENING (Patient taking differently: Take 40-80 mg by mouth See admin instructions. 80 mg in the morning and 40 mg in the evening) 270 tablet 3  . gabapentin (NEURONTIN) 300 MG capsule Take 300 mg by mouth daily as needed (for shingles flares).     . multivitamin (THERAGRAN) per tablet Take 1 tablet by mouth daily.     . pantoprazole (PROTONIX) 40 MG tablet Take 1 tablet (40 mg total) by mouth daily. 90 tablet 1  . potassium chloride (K-DUR) 10 MEQ tablet TAKE 3 TABLETS (30 MEQ TOTAL) BY MOUTH DAILY. 270 tablet 3  . Probiotic Product (PROBIOTIC DAILY PO) Take 1 capsule by mouth daily.     . sacubitril-valsartan (ENTRESTO) 24-26 MG Take 1 tablet by mouth 2 (two) times daily. 60 tablet 5  . sertraline (ZOLOFT) 25 MG tablet Take 1 tablet (25 mg total) by mouth daily. 90 tablet 1  . spironolactone (ALDACTONE) 25 MG tablet Take 1 tablet (25 mg total) by mouth daily. 90 tablet 3  . vitamin B-12 (CYANOCOBALAMIN) 1000 MCG tablet Take 1,000 mcg by mouth daily.    Marland Kitchen zolpidem (AMBIEN) 10 MG tablet Take 1 tablet (10 mg total) by mouth at bedtime as needed for sleep. 90 tablet 1   No current facility-administered medications for this visit.     Allergies:   Atacand [candesartan]; Cephalexin; and Latex   Social History:  The patient  reports that she has never smoked. She has never used smokeless tobacco. She reports that she drinks alcohol. She reports that she does not use drugs.   Family History:  The patient's family history includes Breast cancer in her mother and sister; Kidney cancer in her brother; Lung cancer in her  father.  ROS:  Please see the history of present illness.    All other systems are reviewed and otherwise negative.   PHYSICAL EXAM:  VS:  BP (!) 88/59   Pulse 88   Ht 5\' 4"  (1.626 m)   Wt 187 lb (84.8 kg)   BMI 32.10 kg/m  BMI: Body mass index is 32.1 kg/m. Well nourished, well developed, in no acute distress  HEENT: normocephalic, atraumatic  Neck: no JVD, carotid bruits or masses Cardiac:  RRR; no significant murmurs, no rubs, or gallops Lungs:  CTA b/l, no wheezing, rhonchi or rales  Abd: soft, nontender MS: no deformity or  atrophy Ext:  no edema  Skin: warm and dry,  no rash Neuro:  No gross deficits appreciated Psych: euthymic mood, full affect  ICD site looks pronounced, (she has had prior mastectomy with muscle flap surgery as well, question fluid collection, though the patient states is stable, and unchanged in appearance, no tethering or discomfort   EKG:  Not done today 05/20/17 is SR/V paced ICD interrogation done today and reviewed by myself: battery and lead measurements are good.  NSVT episodes, longest 11 beats, ATR event without EGM  05/21/17: TTE Study Conclusions - Left ventricle: The cavity size was moderately dilated. There was   mild concentric hypertrophy. Systolic function was severely   reduced. The estimated ejection fraction was in the range of 20%   to 25%. Diffuse hypokinesis. Doppler parameters are consistent   with abnormal left ventricular relaxation (grade 1 diastolic   dysfunction). - Aortic valve: There was mild to moderate regurgitation. Mean   gradient (S): 11 mm Hg. Valve area (VTI): 1.09 cm^2. Valve area   (Vmax): 1.15 cm^2. Valve area (Vmean): 1.11 cm^2. - Mitral valve: There was mild regurgitation. - Left atrium: The atrium was mildly dilated. - Right ventricle: Systolic function was normal. - Right atrium: Pacer wire or catheter noted in right atrium. - Tricuspid valve: There was trivial regurgitation. Impressions: - An apical  thrombus can&'t be excluded, additional images with   echocontrast are recommended.  ECHO 09/2013 with Dr. Wynonia Lawman and EF 10-15% with significant dyssynchrony. Subsequently had LV lead checked and was functioning well.  ECHO 7/15 EF 10-15%, moderate to severe RV dysfunction ECHO 4/16 EF 15% normal RV moderate AI ECHO 9/17 EF 20-25% RV ok. 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   Recent Labs: 08/07/2016: B Natriuretic Peptide 333.3 05/21/2017: Magnesium 2.2 06/01/2017: BUN 15; Creatinine, Ser 0.89; Hemoglobin 13.6; Platelets 265.0; Potassium 4.1; Sodium 137 07/06/2017: ALT 21  07/06/2017: Cholesterol 93; HDL 41.40; LDL Cholesterol 32; Total CHOL/HDL Ratio 2; Triglycerides 94.0; VLDL 18.8   CrCl cannot be calculated (Patient's most recent lab result is older than the maximum 21 days allowed.).   Wt Readings from Last 3 Encounters:  07/29/17 187 lb (84.8 kg)  06/01/17 185 lb 6 oz (84.1 kg)  05/21/17 184 lb 15.5 oz (83.9 kg)     Other studies reviewed: Additional studies/records reviewed today include: summarized above  ASSESSMENT AND PLAN:  1. CRT-D     Intact function, no changes made  2. Chronic CHF     Weight at home is reported stable, exam does not suggest fluid OL.  She has symptoms of orthostasis intermittently     She will discuss with CHF team her diuretics/symptoms     She is 100% BiVe pacing    3. TIA     No EGM for ATR event, only that it was < 1 minute duration     RN note remote with <30seconds of AFlutter      She is instructed to have f/u with neurology given post discharge has not, she remains in high dose ASA and will disuss this. We will continue to monitor via device atrial arrhythmia,  burden   Disposition: F/u with CHF  team as instructed by them, continue q 3 month remotes, 6 months in-clinic visit, sooner if needed.  Current medicines are reviewed at length with the patient today.  The patient did not have any concerns regarding medicines.  Venetia Night, PA-C 07/29/2017 8:49 AM     [facility] [place] [address] [phone] (office)  [phone] (fax)

## 2017-07-29 ENCOUNTER — Ambulatory Visit (INDEPENDENT_AMBULATORY_CARE_PROVIDER_SITE_OTHER): Payer: Medicare Other | Admitting: Physician Assistant

## 2017-07-29 VITALS — BP 96/60 | HR 88 | Ht 64.0 in | Wt 187.0 lb

## 2017-07-29 DIAGNOSIS — I428 Other cardiomyopathies: Secondary | ICD-10-CM

## 2017-07-29 DIAGNOSIS — I5022 Chronic systolic (congestive) heart failure: Secondary | ICD-10-CM

## 2017-07-29 DIAGNOSIS — Z95810 Presence of automatic (implantable) cardiac defibrillator: Secondary | ICD-10-CM

## 2017-07-29 LAB — CUP PACEART INCLINIC DEVICE CHECK
Brady Statistic RA Percent Paced: 1 % — CL
Brady Statistic RV Percent Paced: 100 %
Date Time Interrogation Session: 20180905040000
HIGH POWER IMPEDANCE MEASURED VALUE: 32 Ohm
HIGH POWER IMPEDANCE MEASURED VALUE: 61 Ohm
Implantable Lead Implant Date: 20050701
Implantable Lead Implant Date: 20140314
Implantable Lead Location: 753858
Implantable Lead Location: 753860
Implantable Lead Model: 5076
Implantable Lead Serial Number: 156416
Lead Channel Impedance Value: 505 Ohm
Lead Channel Impedance Value: 929 Ohm
Lead Channel Pacing Threshold Amplitude: 0.8 V
Lead Channel Pacing Threshold Amplitude: 1.4 V
Lead Channel Pacing Threshold Pulse Width: 0.4 ms
Lead Channel Sensing Intrinsic Amplitude: 25 mV
Lead Channel Sensing Intrinsic Amplitude: 3.5 mV
Lead Channel Setting Pacing Amplitude: 2 V
Lead Channel Setting Pacing Amplitude: 2.4 V
Lead Channel Setting Pacing Amplitude: 2.4 V
Lead Channel Setting Pacing Pulse Width: 0.6 ms
Lead Channel Setting Pacing Pulse Width: 0.6 ms
MDC IDC LEAD IMPLANT DT: 20050701
MDC IDC LEAD LOCATION: 753859
MDC IDC MSMT LEADCHNL LV PACING THRESHOLD AMPLITUDE: 1.2 V
MDC IDC MSMT LEADCHNL LV PACING THRESHOLD PULSEWIDTH: 0.6 ms
MDC IDC MSMT LEADCHNL LV SENSING INTR AMPL: 11.9 mV
MDC IDC MSMT LEADCHNL RV IMPEDANCE VALUE: 495 Ohm
MDC IDC MSMT LEADCHNL RV PACING THRESHOLD PULSEWIDTH: 0.6 ms
MDC IDC PG IMPLANT DT: 20140314
MDC IDC PG SERIAL: 111235
MDC IDC SET LEADCHNL LV SENSING SENSITIVITY: 1 mV
MDC IDC SET LEADCHNL RV SENSING SENSITIVITY: 0.5 mV

## 2017-07-29 NOTE — Patient Instructions (Addendum)
Medication Instructions:   Your physician recommends that you continue on your current medications as directed. Please refer to the Current Medication list given to you today.  If you need a refill on your cardiac medications before your next appointment, please call your pharmacy.  Labwork: NONE ORDERED  TODAY   Testing/Procedures: NONE ORDERED  TODAY    Follow-Up: Your physician wants you to follow-up in:  [place] will receive a reminder letter in the mail two months in advance. If you don't receive a letter, please call our office to schedule the follow-up appointment.   Remote monitoring is used to monitor your Pacemaker of ICD from home. This monitoring reduces the number of office visits required to check your device to one time per year. It allows [place] to keep an eye on the functioning of your device to ensure it is working properly. You are scheduled for a device check from home on .10-12-17 You may send your transmission at any time that day. If you have a wireless device, the transmission will be sent automatically. After your physician reviews your transmission, you will receive a postcard with your next transmission date.     Any Other Special Instructions Will Be Listed Below (If Applicable).

## 2017-08-03 ENCOUNTER — Telehealth (HOSPITAL_COMMUNITY): Payer: Self-pay | Admitting: Vascular Surgery

## 2017-08-03 NOTE — Telephone Encounter (Signed)
Left pt message to move pt appt to 9/12

## 2017-08-05 ENCOUNTER — Ambulatory Visit (INDEPENDENT_AMBULATORY_CARE_PROVIDER_SITE_OTHER): Payer: Medicare Other | Admitting: Internal Medicine

## 2017-08-05 ENCOUNTER — Encounter: Payer: Self-pay | Admitting: Internal Medicine

## 2017-08-05 ENCOUNTER — Ambulatory Visit (HOSPITAL_COMMUNITY)
Admission: RE | Admit: 2017-08-05 | Discharge: 2017-08-05 | Disposition: A | Discharge status: Home / Self Care | Payer: Medicare Other | Source: Ambulatory Visit | Attending: Internal Medicine | Admitting: Internal Medicine

## 2017-08-05 VITALS — BP 122/68 | HR 79 | Wt 189.0 lb

## 2017-08-05 VITALS — BP 132/78 | HR 63 | Temp 97.8°F | Resp 14 | Ht 64.0 in | Wt 188.0 lb

## 2017-08-05 DIAGNOSIS — F329 Major depressive disorder, single episode, unspecified: Secondary | ICD-10-CM | POA: Diagnosis not present

## 2017-08-05 DIAGNOSIS — G459 Transient cerebral ischemic attack, unspecified: Secondary | ICD-10-CM | POA: Diagnosis not present

## 2017-08-05 DIAGNOSIS — Z7982 Long term (current) use of aspirin: Secondary | ICD-10-CM | POA: Insufficient documentation

## 2017-08-05 DIAGNOSIS — I509 Heart failure, unspecified: Secondary | ICD-10-CM | POA: Insufficient documentation

## 2017-08-05 DIAGNOSIS — I5022 Chronic systolic (congestive) heart failure: Secondary | ICD-10-CM

## 2017-08-05 DIAGNOSIS — Z0000 Encounter for general adult medical examination without abnormal findings: Secondary | ICD-10-CM

## 2017-08-05 DIAGNOSIS — E559 Vitamin D deficiency, unspecified: Secondary | ICD-10-CM | POA: Diagnosis not present

## 2017-08-05 DIAGNOSIS — Z23 Encounter for immunization: Secondary | ICD-10-CM

## 2017-08-05 DIAGNOSIS — E785 Hyperlipidemia, unspecified: Secondary | ICD-10-CM

## 2017-08-05 MED ORDER — CLOPIDOGREL BISULFATE 75 MG PO TABS: 75.0000 mg | ORAL_TABLET | Freq: Every day | ORAL | 3 refills | Status: DC

## 2017-08-05 MED ORDER — ASPIRIN 81 MG PO TABS: 325.0000 mg | ORAL_TABLET | Freq: Every day | ORAL | 3 refills | Status: DC

## 2017-08-05 NOTE — Patient Instructions (Signed)
    GO TO THE FRONT DESK Schedule your next appointment for a  checkup and Pap smear in 3 months

## 2017-08-05 NOTE — Assessment & Plan Note (Addendum)
-  Td 2013;  pneumonia 2017; booster 06-2016 prevnar:2015;   zostavax 2013; shingrix discussed ; flu shot today - Cscope 2003, and 11-2011, next 10 years (Dr Deatra Ina) -Female care: PAP  8--2009   01-2010, 2013 (normal)-- needs one additional PAP, if negative we can stop per guidelines. rec to  RTC for a PAP MMG-- h/o mastectomy , self chest examination normal per patient -Labs reviewed, not due for any lab Diet and exercise discussed .

## 2017-08-05 NOTE — Progress Notes (Signed)
ADVANCED HEART FAILURE CLINIC NOTE  Patient ID: [name], female   DOB: [birth year], 66 y.o.   MRN: [id number] PCP: Dr Larose Kells EP: Dr Caryl Comes  Subjective [name] is a 66 y/o woman with h/o chronic systolic presumed due to chemotherapy-related cardiomyopathy diagnosed with breast cancer in 1982 in [place]. Treated with chemo (including adriamycin)/XRT and L mastectomy. Had R breast cancer (unrelated) in 1987. Has been cancer free since. Denies HTN, HL, DM2.  Was first diagnosed with HF in 1988. Had left heart cath she thinks in 2009. 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 Echo 2009 EF 20% with moderate AI and mild to moderate MR. Had [place] ICD placed and then LV lead became nonfunctional. In 3/14, underwent CRT revision.   Previously on coumadin due to cardiomyopathy but discontinued. Had sleep study 1/17 very mild OSA (6.2/hr) not using CPAP.   Admitted in 6/18 withTIA. Unable to use right hand and speech slurred. CT normal. Unable to do MRI.   She is here for routine f/u. Remains stable. Still can do most of her activities but gets SOB easily. Often lightheaded when standing. No syncope. Denies edema, orthopnea or PND. ICD has not fired.    ECHO 09/2013 with Dr. Wynonia Lawman and EF 10-15% with significant dyssynchrony. Subsequently had LV lead checked and was functioning well.  ECHO 7/15 EF 10-15%, moderate to severe RV dysfunction ECHO 4/16 EF 15% normal RV moderate AI ECHO 9/17 EF 20-25% RV ok. Severe MR mild AI/AS Echo 6/18 EF 20-25% RV ok Mild to moderate AI   CPX 10/17  FVC 3.44 (113%)    FEV1 2.32 (97%)     FEV1/FVC 67 (86%)     MVV 90 (101%)    Resting HR: 72 Peak HR: 112 (68% age predicted max HR)  BP rest: 104/64 BP peak: 146/64 Peak VO2: 16.0 (91% predicted peak VO2) VE/VCO2 slope: 34.6 OUES: 1.90 Peak RER: 1.10 Ventilatory Threshold: 13.5 (68% predicted or measured peak VO2) VE/MVV: 64% PETCO2 at peak: 29 O2pulse:  13  (130% predicted O2pulse)  LHC/RHC 10/06/13  RA = 11  RV = 54/9/13  PA = 53/27 (38)  PCW = 23  Fick cardiac output/index = 3.8/2.0  Thermo CO/CI = 3.4/1.8  PVR = 3.9 WU (Fick)  FA sat = 98%  PA sat = 62%, 64%  SVC = 59%  Coronaries no significant disease but there was a fistula from small D1 => PA.   CPX 10/25/13  Peak VO2: 15.6 (81.2% predicted peak VO2) VE/VCO2 slope: 36.9 OUES: 1.25 Peak RER: 1.08  CPX 05/24/14 FVC 3.05 (92%)  FEV1 2.23 (88%)  FEV1/FVC 73%  MVV 92 (100%) Resting HR: 64 Peak HR: 132 (68% age predicted max HR) BP rest: 108/70 BP peak: 130/66  Peak VO2: 16.4 (81.1% predicted peak VO2) VE/VCO2 slope: 33.6 OUES: 1.74 Peak RER: 1.16 Ventilatory Threshold: 12.5 (61.8% predicted peak VO2) VE/MVV: 62% PETCO2 at peak: 31 O2pulse: 12 (109% predicted O2pulse)    Lab 10/06/13 K 2.8 Creatinine 0.83 Labs 10/31/13 K 3.0 Creatinine 0.68  Dig level 0.9 Pro BNP 3906  Labs 11/11/13  K 3.6 Creatinine 0.8 Labs 12/16/13 K 3.8 Creatinine 0.8  Labs 4/15 K 4, creatinine 0.8, LDL 128, TSH normal, digoxin 0.6 Labs 6/15 K 4.1 Cr 0.8 digoxin 0.7 Labs 8/15 K 4.2, creatinine 0.78, pBNP 2126 Labs 8/15 K 4.2, creatinine 1.0 dig <0.5  ECG: NSR, BiV paced  Blood type A+   FHX: Had older brother with  HF in his 57s. No strong FHx of HF.   SocHx: Retired. Non-smoker. Rare ETOH. 3 grown kids.    Past Medical History:  Diagnosis Date  . Breast CA ([place])    twice first on L breast 1982 w/ chest wall involvment, then a second breast ca on the R in 1987  . Chronic systolic CHF (congestive heart failure) ([place])    a. due to Adriamycin,s/p ICD;  b. 01/2013 Gen change and new LV lead - BSX Energen CRT-D BiV ICD, Ser # [id number]  . Depression   . Diverticulitis   . Early menopause    early 68s  . Fatigue 02/20/2016  . Glaucoma suspect of both eyes    Dr. Marshall Cork  . Nonischemic cardiomyopathy ([place])   . OSA (obstructive sleep apnea) 02/07/2016  . Osteopenia   . Renal  calculus   . Snoring 02/20/2016    Past Surgical History:  Procedure Laterality Date  . BI-VENTRICULAR IMPLANTABLE CARDIOVERTER DEFIBRILLATOR UPGRADE N/A 02/04/2013   Procedure: BI-VENTRICULAR IMPLANTABLE CARDIOVERTER DEFIBRILLATOR UPGRADE;  Surgeon: Evans Lance, MD;  Location[facility] C[facility] LAB;  Service: Cardiovascular;  Laterality: N/A;  . CARDIAC DEFIBRILLATOR PLACEMENT     AICD Replaced-5/09 and 2014  . LEFT AND RIGHT HEART CATHETERIZATION WITH CORONARY ANGIOGRAM N/A 10/06/2013   Procedure: LEFT AND RIGHT HEART CATHETERIZATION WITH CORONARY ANGIOGRAM;  Surgeon: Jolaine Artist, MD;  Location: [facility] CATH LAB;  Service: Cardiovascular;  Laterality: N/A;  . LITHOTRIPSY     h/o several procedures   . MASTECTOMY     B  . OOPHORECTOMY     B, per genticists advise at [place]  . VENOGRAM N/A 10/20/2012   Procedure: VENOGRAM;  Surgeon: Deboraha Sprang, MD;  Location: [facility] CATH LAB;  Service: Cardiovascular;  Laterality: N/A;    Current Outpatient Prescriptions  Medication Sig Dispense Refill  . ascorbic acid (VITAMIN C) 1000 MG tablet Take 1,000 mg by mouth daily.    Marland Kitchen aspirin 325 MG tablet Take 1 tablet (325 mg total) by mouth daily. 30 tablet 1  . atorvastatin (LIPITOR) 40 MG tablet Take 1 tablet (40 mg total) by mouth daily. 30 tablet 11  . Biotin 5000 MCG CAPS Take 5,000 mcg by mouth daily.     . carvedilol (COREG) 12.5 MG tablet Take 1 tablet (12.5 mg total) by mouth 2 (two) times daily with a meal. 180 tablet 1  . digoxin (LANOXIN) 0.125 MG tablet Take 1 tablet (125 mcg total) by mouth daily. 30 tablet 6  . furosemide (LASIX) 40 MG tablet TAKE 2 TABLETS BY MOUTH EVERY MORNING AND 1 TABLET EVERY EVENING (Patient taking differently: Take 40-80 mg by mouth See admin instructions. 80 mg in the morning and 40 mg in the evening) 270 tablet 3  . gabapentin (NEURONTIN) 300 MG capsule Take 300 mg by mouth daily as needed (for shingles flares).     . multivitamin (THERAGRAN) per tablet Take 1  tablet by mouth daily.     . pantoprazole (PROTONIX) 40 MG tablet Take 1 tablet (40 mg total) by mouth daily. 90 tablet 1  . potassium chloride (K-DUR) 10 MEQ tablet TAKE 3 TABLETS (30 MEQ TOTAL) BY MOUTH DAILY. 270 tablet 3  . Probiotic Product (PROBIOTIC DAILY PO) Take 1 capsule by mouth daily.     . sacubitril-valsartan (ENTRESTO) 24-26 MG Take 1 tablet by mouth 2 (two) times daily. 60 tablet 5  . sertraline (ZOLOFT) 25 MG tablet Take 1 tablet (25 mg total) by  mouth daily. 90 tablet 1  . spironolactone (ALDACTONE) 25 MG tablet Take 1 tablet (25 mg total) by mouth daily. 90 tablet 3  . vitamin B-12 (CYANOCOBALAMIN) 1000 MCG tablet Take 1,000 mcg by mouth daily.    Marland Kitchen zolpidem (AMBIEN) 10 MG tablet Take 1 tablet (10 mg total) by mouth at bedtime as needed for sleep. 90 tablet 1   No current facility-administered medications for this encounter.     Allergies  Allergen Reactions  . Atacand [Candesartan] Other (See Comments)    Causes fatigue   . Cephalexin Hives and Itching  . Latex Rash     Physical Exam BP 122/68   Pulse 79   Wt 189 lb (85.7 kg)   SpO2 97%   BMI 32.44 kg/m  General:  Well appearing. No resp difficulty HEENT: normal Neck: supple. no JVD. Carotids 2+ bilat; no bruits. No lymphadenopathy or thryomegaly appreciated. Cor: PMI laterally displaced. Regular rate & rhythm. Soft AI murmur Lungs: clear Abdomen: soft, nontender, nondistended. No hepatosplenomegaly. No bruits or masses. Good bowel sounds. Extremities: no cyanosis, clubbing, rash, edema Neuro: alert & orientedx3, cranial nerves grossly intact. moves all 4 extremities w/o difficulty. Affect pleasant  Assessment and  Plan  1) Chronic systolic HF: Nonischemic cardiomyopathy, possibly anthracycline cardiotoxicity.  LHC 11/14 without significant coronary disease.    Has [employer] CRT-D. ECHO 09/2013 EF 10-15%. Echo at [facility] 4/16 EF 15% moderate AI. Echo 9/17 EF 20-25%.  RV ok. Severe MR mild AI/AS  Seen  in [facility] and will follow prn - Stable NYHA III Volume status ok  - CPX  10/17 Stable with moderate (to severe HF limitation).  - Will repeat CPX  - Continue to follow closely.  - Continue Entresto, carvedilol, digoxin, spironolactone at current doses. - Corlanor may be an option down the road if HR remains > 70.   - Has previously seen Dr. Stann Mainland at [facility] and will refer back as needed. (Blood type A positive) 2) h/o bilateral breast CA in 1982 and 1987 (treated with Adriamycin in 1982) 3) ICD - follows with Dr. Caryl Comes 4) h/o TIA. - Decrease ASA to 81. Add Plavix. Continue statin.   Glori Bickers MD 08/05/2017

## 2017-08-05 NOTE — Progress Notes (Signed)
Subjective:    Patient ID: [name], female    DOB: July 01, 1951, 66 y.o.   MRN: [id number]  DOS:  08/05/2017 Type of visit - description : cpx Interval history: Since the last time, she is doing well, no major concerns, saw cardiology, note reviewed, no changes made.   Review of Systems On Lipitor, no  different or major aches or pains On aspirin, reports easy bruising but no major bleeding such as blood in the urine or in the stools.  Other than above, a 14 point review of systems is negative     Past Medical History:  Diagnosis Date  . Breast CA ([place])    twice first on L breast 1982 w/ chest wall involvment, then a second breast ca on the R in 1987  . Chronic systolic CHF (congestive heart failure) ([place])    a. due to Adriamycin,s/p ICD;  b. 01/2013 Gen change and new LV lead - BSX [place] CRT-D BiV ICD, Ser # [id number]  . Depression   . Diverticulitis   . Early menopause    early 52s  . Fatigue 02/20/2016  . Glaucoma suspect of both eyes    Dr. Marshall Cork  . Nonischemic cardiomyopathy ([place])   . OSA (obstructive sleep apnea) 02/07/2016  . Osteopenia   . Renal calculus   . Snoring 02/20/2016    Past Surgical History:  Procedure Laterality Date  . BI-VENTRICULAR IMPLANTABLE CARDIOVERTER DEFIBRILLATOR UPGRADE N/A 02/04/2013   Procedure: BI-VENTRICULAR IMPLANTABLE CARDIOVERTER DEFIBRILLATOR UPGRADE;  Surgeon: Evans Lance, MD;  Location: [facility] CATH LAB;  Service: Cardiovascular;  Laterality: N/A;  . CARDIAC DEFIBRILLATOR PLACEMENT     AICD Replaced-5/09 and 2014  . LEFT AND RIGHT HEART CATHETERIZATION WITH CORONARY ANGIOGRAM N/A 10/06/2013   Procedure: LEFT AND RIGHT HEART CATHETERIZATION WITH CORONARY ANGIOGRAM;  Surgeon: Jolaine Artist, MD;  Location: [facility] CATH LAB;  Service: Cardiovascular;  Laterality: N/A;  . LITHOTRIPSY     h/o several procedures   . MASTECTOMY     B  . OOPHORECTOMY     B, per genticists advise at [place]  . VENOGRAM N/A 10/20/2012    Procedure: VENOGRAM;  Surgeon: Deboraha Sprang, MD;  Location: [facility] CATH LAB;  Service: Cardiovascular;  Laterality: N/A;    Social History   Social History  . Marital status: Married    Spouse name: N/A  . Number of children: 3  . Years of education: N/A   Occupational History  . 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 Smoking status: Never Smoker  . Smokeless tobacco: Never Used  . Alcohol use Yes     Comment: socially/occassionally  . Drug use: No  . Sexual activity: Not on file   Other Topics Concern  . Not on file   Social History Narrative   Related to Mr and Mrs Huston Foley, they are my patients as well    Married, 3 children all in [place], 7 Gkids           Family History  Problem Relation Age of Onset  . Kidney cancer Brother   . Breast cancer Mother        M and sister   . Lung cancer Father   . Breast cancer Sister   . Heart attack Neg Hx   . Diabetes Neg Hx   . Colon cancer Neg Hx      Allergies as of 08/05/2017  Reactions   Atacand [candesartan] Other (See Comments)   Causes fatigue   Cephalexin Hives, Itching   Latex Rash      Medication List       Accurate as of 08/05/17 11:59 PM. Always use your most recent med list.          ascorbic acid 1000 MG tablet Commonly known as:  VITAMIN C Take 1,000 mg by mouth daily.   aspirin 81 MG tablet Take 4 tablets (325 mg total) by mouth daily.   atorvastatin 40 MG tablet Commonly known as:  LIPITOR Take 1 tablet (40 mg total) by mouth daily.   Biotin 5000 MCG Caps Take 5,000 mcg by mouth daily.   carvedilol 12.5 MG tablet Commonly known as:  COREG Take 1 tablet (12.5 mg total) by mouth 2 (two) times daily with a meal.   clopidogrel 75 MG tablet Commonly known as:  PLAVIX Take 1 tablet (75 mg total) by mouth daily.   digoxin 0.125 MG tablet Commonly known as:  LANOXIN Take 1 tablet (125 mcg total) by mouth daily.   furosemide 40 MG  tablet Commonly known as:  LASIX TAKE 2 TABLETS BY MOUTH EVERY MORNING AND 1 TABLET EVERY EVENING   gabapentin 300 MG capsule Commonly known as:  NEURONTIN Take 300 mg by mouth daily as needed (for shingles flares).   multivitamin per tablet Take 1 tablet by mouth daily.   pantoprazole 40 MG tablet Commonly known as:  PROTONIX Take 1 tablet (40 mg total) by mouth daily.   potassium chloride 10 MEQ tablet Commonly known as:  K-DUR TAKE 3 TABLETS (30 MEQ TOTAL) BY MOUTH DAILY.   PROBIOTIC DAILY PO Take 1 capsule by mouth daily.   sacubitril-valsartan 24-26 MG Commonly known as:  ENTRESTO Take 1 tablet by mouth 2 (two) times daily.   sertraline 25 MG tablet Commonly known as:  ZOLOFT Take 1 tablet (25 mg total) by mouth daily.   spironolactone 25 MG tablet Commonly known as:  ALDACTONE Take 1 tablet (25 mg total) by mouth daily.   vitamin B-12 1000 MCG tablet Commonly known as:  CYANOCOBALAMIN Take 1,000 mcg by mouth daily.   zolpidem 10 MG tablet Commonly known as:  AMBIEN Take 1 tablet (10 mg total) by mouth at bedtime as needed for sleep.            Discharge Care Instructions        Start     Ordered   08/05/17 0000  Flu vaccine HIGH DOSE PF (Fluzone High dose)     08/05/17 0833         Objective:   Physical Exam BP 132/78 (BP Location: Left Arm, Patient Position: Sitting, Cuff Size: Small)   Pulse 63   Temp 97.8 F (36.6 C) (Oral)   Resp 14   Ht 5\' 4"  (1.626 m)   Wt 188 lb (85.3 kg)   SpO2 96%   BMI 32.27 kg/m   General:   Well developed, well nourished . NAD.  Neck: No  thyromegaly  HEENT:  Normocephalic . Face symmetric, atraumatic Lungs:  CTA B Normal respiratory effort, no intercostal retractions, no accessory muscle use. Heart: RRR,  + systolic murmur.  No pretibial edema bilaterally  Abdomen:  Not distended, soft, non-tender. No rebound or rigidity.   Skin: Exposed areas without rash. Not pale. Not jaundice Neurologic:  alert  & oriented X3.  Speech normal, gait appropriate for age and unassisted Strength symmetric and appropriate for age.  Psych: Cognition and judgment appear intact.  Cooperative with normal attention span and concentration.  Behavior appropriate. No anxious or depressed appearing.    Assessment & Plan:   Assessment Hyperglycemia A1c 5.9 (04/2017) CV:Dr. Caryl Comes and [facility] --CHF, nonischemic cardiomyopathy:due to chemotherapy --MR severe --VT -TIA 05-2017 Depression Mild OSA per sleep study 11-2015, saw Dr Radford Pax 02-20-16: no need for CPAP Osteopenia-- DEXA 06/13/2015 normal Vitamin D deficiency Kidney stones Menopause Breast cancer x 2: first in 1982 with chest wall involvement, 2nd on the R dx  1987, s/p B mastectomy Handicap sticker signed 06-2016 Post herpetic neuralgia (shingles 2014) , R face-- gaba prn  PLAN: CHF, seems to be doing well, no evidence of vol overload. Reports occ dizziness but otherwise doing okay. TIA: No further sxs, on aspirin 325 mg, occasional bruising, no major bleeding. Has not seen neurology however at this point she is doing well ; recommend no change in therapy.  Depression: on Sertraline, sxs controlled. H/o vitamin D deficiency: Last labs normal. Hyperlipidemia: Cholesterol is now under excellent control on Lipitor, LFTs remain normal. RTC 3 months for a Pap smear

## 2017-08-05 NOTE — Patient Instructions (Signed)
Decrease Aspirin to 81 mg (1 tab) daily (over-the-counter)  Start Plavix 75 mg (1tab) daily  Your physician has recommended that you have a cardiopulmonary stress test (CPX). CPX testing is a non-invasive measurement of heart and lung function. It replaces a traditional treadmill stress test. This type of test provides a tremendous amount of information that relates not only to your present condition but also for future outcomes. This test combines measurements of you ventilation, respiratory gas exchange in the lungs, electrocardiogram (EKG), blood pressure and physical response before, during, and following an exercise protocol.  Your physician recommends that you schedule a follow-up appointment in: 3-4 months

## 2017-08-05 NOTE — Progress Notes (Signed)
Pre visit review using our clinic review tool, if applicable. No additional management support is needed unless otherwise documented below in the visit note. 

## 2017-08-06 ENCOUNTER — Encounter (HOSPITAL_COMMUNITY): Payer: Medicare Other | Admitting: Internal Medicine

## 2017-08-06 NOTE — Assessment & Plan Note (Signed)
CHF, seems to be doing well, no evidence of vol overload. Reports occ dizziness but otherwise doing okay. TIA: No further sxs, on aspirin 325 mg, occasional bruising, no major bleeding. Has not seen neurology however at this point she is doing well ; recommend no change in therapy.  Depression: on Sertraline, sxs controlled. H/o vitamin D deficiency: Last labs normal. Hyperlipidemia: Cholesterol is now under excellent control on Lipitor, LFTs remain normal. RTC 3 months for a Pap smear

## 2017-08-07 ENCOUNTER — Encounter: Payer: Medicare Other | Admitting: Nurse Practitioner

## 2017-08-15 IMAGING — CT CT HEAD CODE STROKE
3 series · 15 of 47 positions shown, 18 images · non-contrast
Comparison: Head CT 03/06/2010

CLINICAL DATA: Code stroke.  Slurred speech and facial droop

EXAM:
CT HEAD WITHOUT CONTRAST
TECHNIQUE: Contiguous axial images were obtained from the base of the skull
through the vertex without intravenous contrast.

[Series 3: head 5.0 (person_name) (person_name) · axial · 0.42mm/px · z∈[-219,-94]mm · 9 of 31 slices shown, 12 images]
[im 3/31  brain]
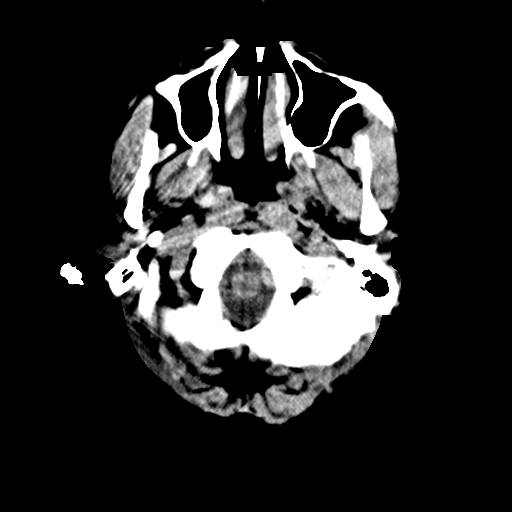
[im 3/31  bone]
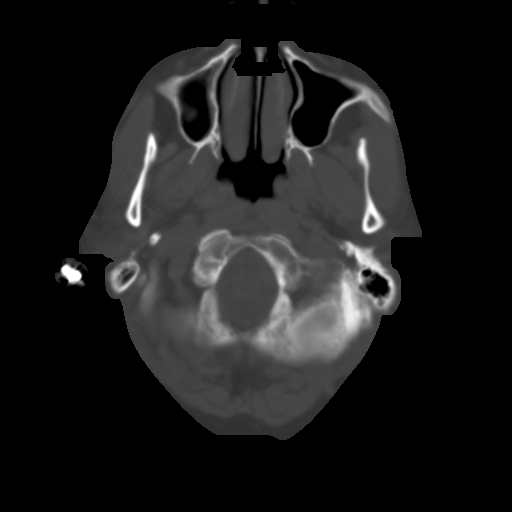
[im 6/31  brain]
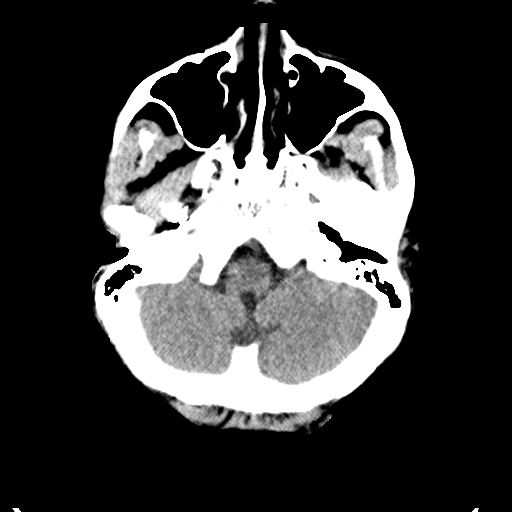
[im 9/31  brain]
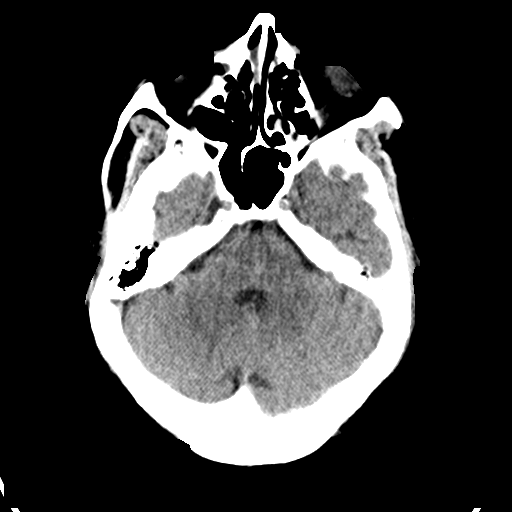
[im 12/31  brain]
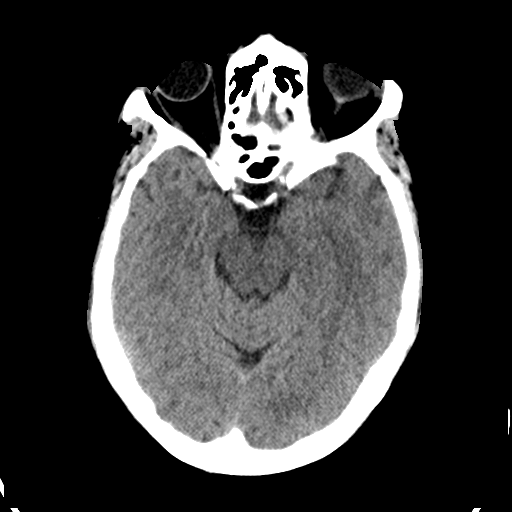
[im 16/31  brain]
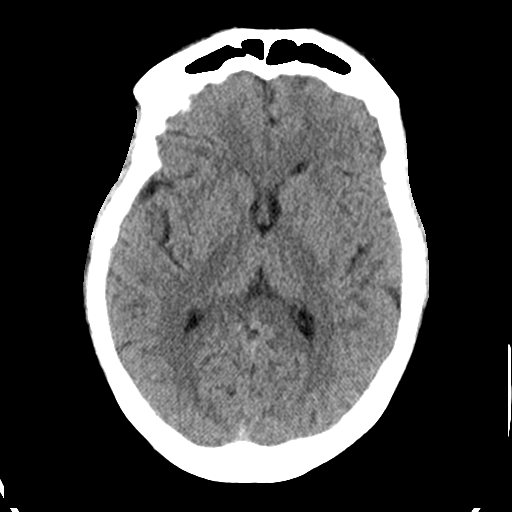
[im 16/31  bone]
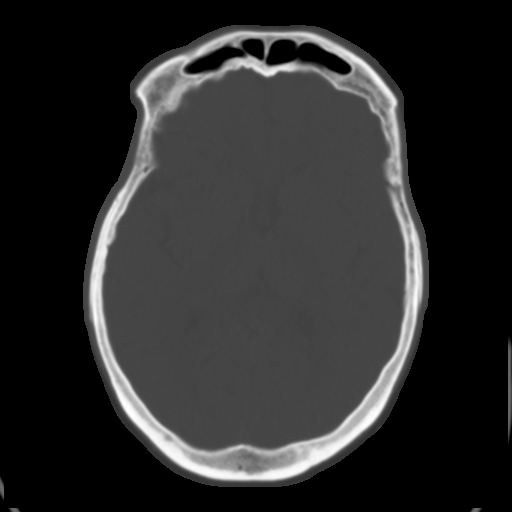
[im 19/31  brain]
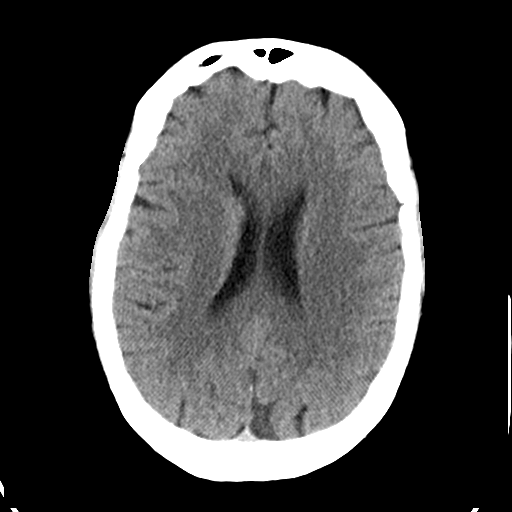
[im 22/31  brain]
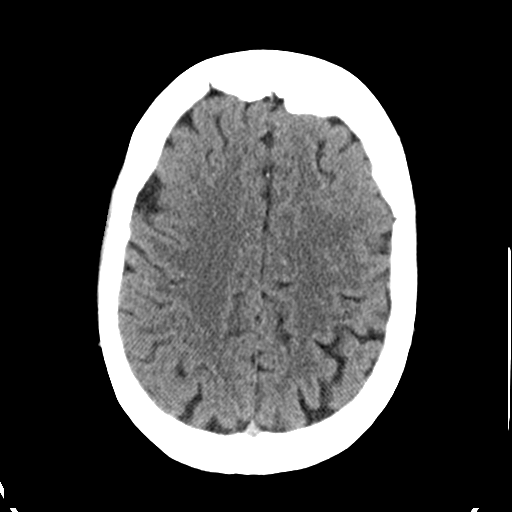
[im 25/31  brain]
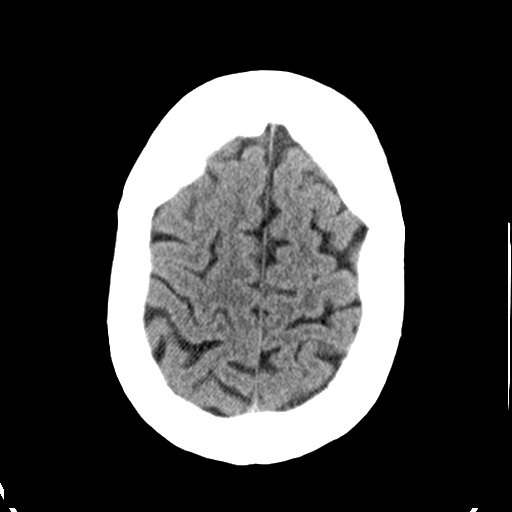
[im 28/31  brain]
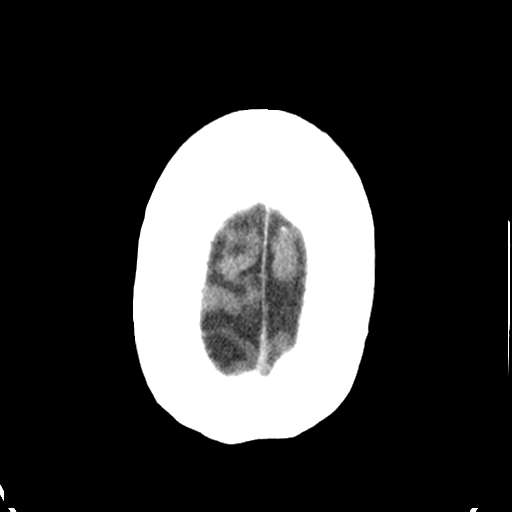
[im 28/31  bone]
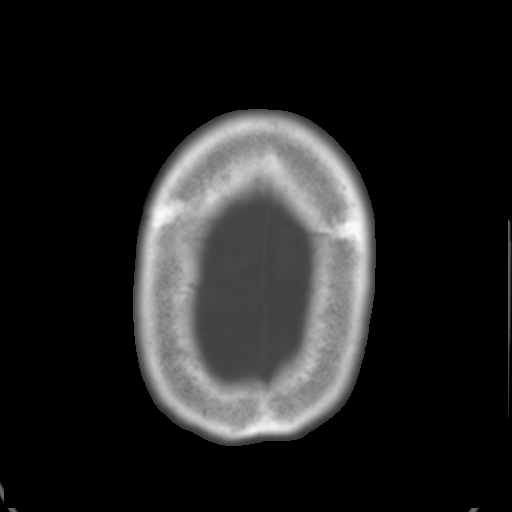

[Series 5: head 3.0 cor st · coronal · 0.32mm/px · 3 of 67 slices shown]
[im 23/67  brain]
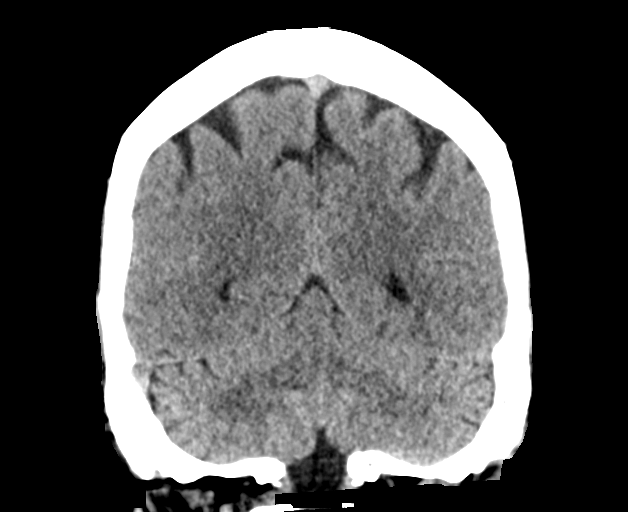
[im 30/67  brain]
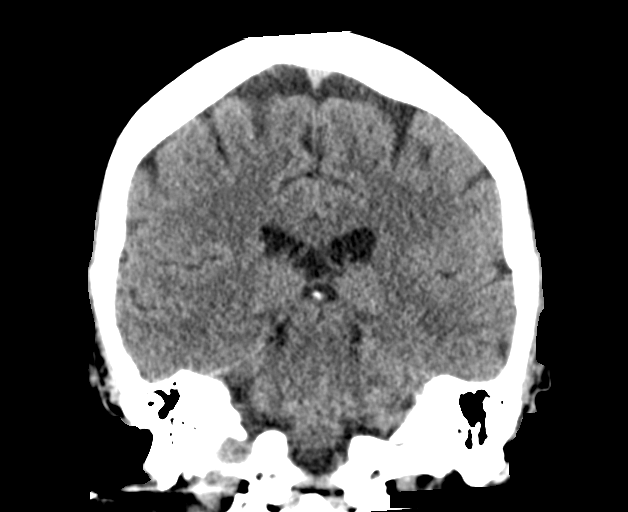
[im 37/67  brain]
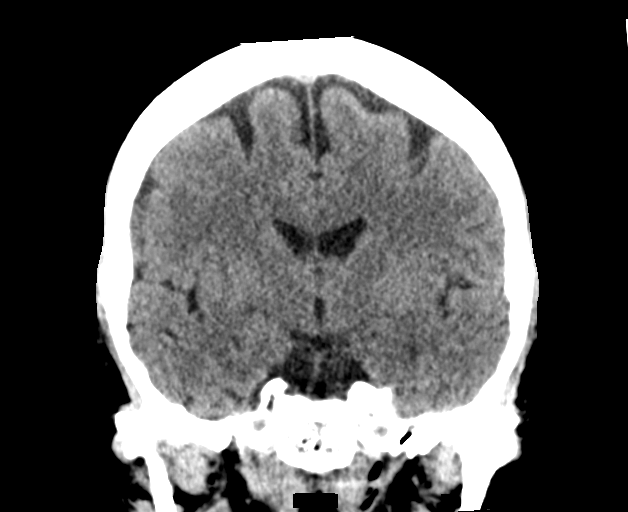

[Series 6: head 3.0 sag st · sagittal · 0.32mm/px · 3 of 67 slices shown]
[im 23/67  brain]
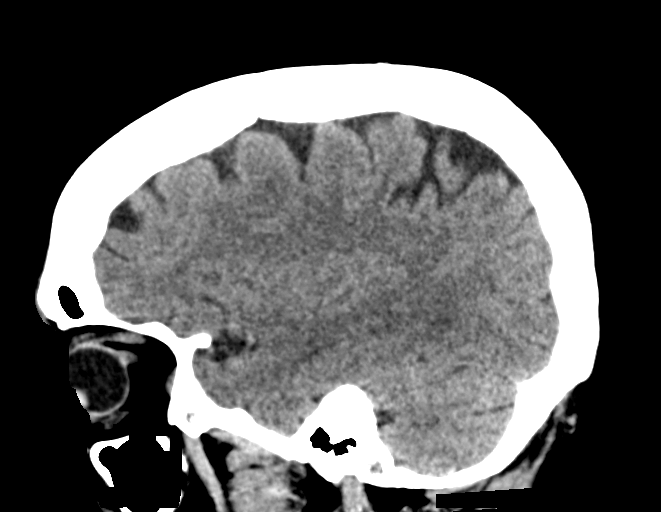
[im 34/67  brain]
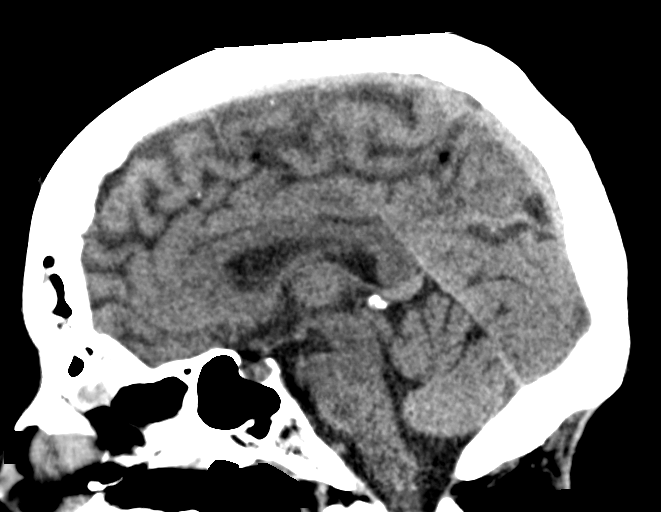
[im 45/67  brain]
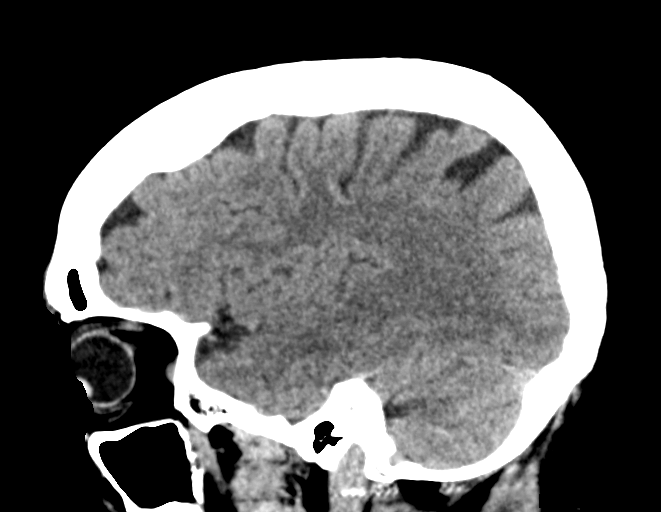

[15 of 47 positions shown; findings below may reference images not displayed]

FINDINGS: Brain: No mass lesion or acute hemorrhage. No focal hypoattenuation
of the basal ganglia or cortex to indicate infarcted tissue. No
hydrocephalus or age advanced atrophy.

Vascular: No hyperdense vessel. No advanced atherosclerotic
calcification of the arteries at the skull base.

Skull: Normal visualized skull base, calvarium and extracranial soft
tissues.

Sinuses/Orbits: No sinus fluid levels or advanced mucosal
thickening. No mastoid effusion. Normal orbits.

ASPECTS ([place]ber[place] Stroke Program Early CT Score)

- Ganglionic level infarction (caudate, lentiform nuclei, internal
capsule, insula, M1-M3 cortex): 7

- Supraganglionic infarction (M4-M6 cortex): 3

Total score (0-10 with 10 being normal): 10
IMPRESSION: 1. Normal head CT.
2. ASPECTS is 10.

Dr.  Taiyoung Onlife was paged at [DATE] p.m.

## 2017-08-21 ENCOUNTER — Other Ambulatory Visit (HOSPITAL_COMMUNITY): Payer: Self-pay | Admitting: *Deleted

## 2017-09-14 ENCOUNTER — Telehealth: Payer: Self-pay | Admitting: *Deleted

## 2017-09-14 NOTE — Telephone Encounter (Signed)
Patient called and requested samples of entresto. She stated that she was provided some at her office visit here last month and was told to call the office when she needed more as we could supply her samples to get her through the donut hole. I apologized and made her aware that unfortunately we are unable to provide enough samples to get her through the remainder of the year. Patient was actually started on this medication by Dr Haroldine Laws at the CHF clinic. I made her aware that I could leave one box of samples at the front desk but she should contact his office for further assistance with this medication. Patient verbalized her understanding and appreciation.

## 2017-10-01 ENCOUNTER — Other Ambulatory Visit: Payer: Self-pay | Admitting: Internal Medicine

## 2017-10-12 ENCOUNTER — Ambulatory Visit (INDEPENDENT_AMBULATORY_CARE_PROVIDER_SITE_OTHER): Payer: Medicare Other | Admitting: *Deleted

## 2017-10-12 DIAGNOSIS — I428 Other cardiomyopathies: Secondary | ICD-10-CM

## 2017-10-13 NOTE — Progress Notes (Signed)
Remote ICD transmission.   

## 2017-10-14 LAB — CUP PACEART REMOTE DEVICE CHECK
Battery Remaining Percentage: 94 %
Brady Statistic RA Percent Paced: 0 %
Date Time Interrogation Session: 20181119051200
HighPow Impedance: 57 Ohm
Implantable Lead Implant Date: 20050701
Implantable Lead Implant Date: 20050701
Implantable Lead Location: 753858
Implantable Lead Location: 753859
Implantable Lead Model: 158
Implantable Lead Model: 4196
Implantable Lead Serial Number: 156416
Lead Channel Impedance Value: 491 Ohm
Lead Channel Impedance Value: 800 Ohm
Lead Channel Pacing Threshold Amplitude: 1.2 V
Lead Channel Pacing Threshold Pulse Width: 0.6 ms
Lead Channel Pacing Threshold Pulse Width: 0.6 ms
Lead Channel Setting Pacing Amplitude: 2.4 V
Lead Channel Setting Sensing Sensitivity: 0.5 mV
Lead Channel Setting Sensing Sensitivity: 1 mV
MDC IDC LEAD IMPLANT DT: 20140314
MDC IDC LEAD LOCATION: 753860
MDC IDC MSMT BATTERY REMAINING LONGEVITY: 60 mo
MDC IDC MSMT LEADCHNL RA PACING THRESHOLD AMPLITUDE: 0.8 V
MDC IDC MSMT LEADCHNL RA PACING THRESHOLD PULSEWIDTH: 0.4 ms
MDC IDC MSMT LEADCHNL RV IMPEDANCE VALUE: 447 Ohm
MDC IDC MSMT LEADCHNL RV PACING THRESHOLD AMPLITUDE: 1.4 V
MDC IDC PG IMPLANT DT: 20140314
MDC IDC PG SERIAL: 111235
MDC IDC SET LEADCHNL LV PACING PULSEWIDTH: 0.6 ms
MDC IDC SET LEADCHNL RA PACING AMPLITUDE: 2 V
MDC IDC SET LEADCHNL RV PACING AMPLITUDE: 2.4 V
MDC IDC SET LEADCHNL RV PACING PULSEWIDTH: 0.6 ms
MDC IDC STAT BRADY RV PERCENT PACED: 100 %

## 2017-10-22 ENCOUNTER — Encounter: Payer: Self-pay | Admitting: Cardiology

## 2017-10-28 ENCOUNTER — Other Ambulatory Visit: Payer: Self-pay | Admitting: Internal Medicine

## 2017-10-30 ENCOUNTER — Telehealth (HOSPITAL_COMMUNITY): Payer: Self-pay | Admitting: Cardiology

## 2017-10-30 NOTE — Telephone Encounter (Signed)
Patient called to request samples of entresto as she is currently in the donut hole. Advised at this time we could only supply 2 weeks of samples and patient could call back prior to running out to check if additional samples were brought in .  Patient voiced understanding and appreciative of samples. Message to CHF pharmD Eudelia Bunch. as FYI  Medication Samples have been provided to the patient.  Drug name: entresto       Strength: 24/26        Qty: 28  LOT: [id number]  Exp.Date: 07/2019  Dosing instructions: one tab by mouth twice a day  The patient has been instructed regarding the correct time, dose, and frequency of taking this medication, including desired effects and most common side effects.   Kerry Dory 8:47 AM 10/30/2017

## 2017-11-05 ENCOUNTER — Ambulatory Visit: Payer: Medicare Other | Admitting: Internal Medicine

## 2017-11-05 ENCOUNTER — Encounter: Payer: Self-pay | Admitting: Internal Medicine

## 2017-11-05 ENCOUNTER — Other Ambulatory Visit (HOSPITAL_COMMUNITY)
Admission: RE | Admit: 2017-11-05 | Discharge: 2017-11-05 | Disposition: A | Discharge status: Home / Self Care | Payer: Medicare Other | Source: Ambulatory Visit | Attending: Internal Medicine | Admitting: Internal Medicine

## 2017-11-05 VITALS — BP 124/68 | HR 72 | Temp 98.2°F | Resp 14 | Ht 64.0 in | Wt 180.5 lb

## 2017-11-05 DIAGNOSIS — Z01419 Encounter for gynecological examination (general) (routine) without abnormal findings: Secondary | ICD-10-CM | POA: Insufficient documentation

## 2017-11-05 DIAGNOSIS — I5022 Chronic systolic (congestive) heart failure: Secondary | ICD-10-CM

## 2017-11-05 LAB — BASIC METABOLIC PANEL
BUN: 21 mg/dL (ref 6–23)
CALCIUM: 9.4 mg/dL (ref 8.4–10.5)
CHLORIDE: 103 meq/L (ref 96–112)
CO2: 30 meq/L (ref 19–32)
CREATININE: 0.96 mg/dL (ref 0.40–1.20)
GFR: 61.36 mL/min (ref >60.00)
Glucose, Bld: 125 mg/dL — ABNORMAL HIGH (ref 70–99)
POTASSIUM: 4.2 meq/L (ref 3.5–5.1)
SODIUM: 139 meq/L (ref 135–145)

## 2017-11-05 NOTE — Progress Notes (Signed)
Subjective:    Patient ID: [name], female    DOB: 05/24/1951, 66 y.o.   MRN: [id number]  DOS:  11/05/2017 Type of visit - description : f/u Interval history: No major concerns. Due for a Pap smear. Medications reviewed, good compliance, recently they added Plavix.   Review of Systems Denies vaginal discharge or bleeding. No blood in the stools, no gross hematuria.   Past Medical History:  Diagnosis Date  . Breast CA ([place])    twice first on L breast 1982 w/ chest wall involvment, then a second breast ca on the R in 1987  . Chronic systolic CHF (congestive heart failure) ([place])    a. due to Adriamycin,s/p ICD;  b. 01/2013 Gen change and new LV lead - BSX Energen CRT-D BiV ICD, Ser # [id number]  . Depression   . Diverticulitis   . Early menopause    early 61s  . Fatigue 02/20/2016  . Glaucoma suspect of both eyes    Dr. Marshall Cork  . Nonischemic cardiomyopathy ([place])   . OSA (obstructive sleep apnea) 02/07/2016  . Osteopenia   . Renal calculus   . Snoring 02/20/2016    Past Surgical History:  Procedure Laterality Date  . BI-VENTRICULAR IMPLANTABLE CARDIOVERTER DEFIBRILLATOR UPGRADE N/A 02/04/2013   Procedure: BI-VENTRICULAR IMPLANTABLE CARDIOVERTER DEFIBRILLATOR UPGRADE;  Surgeon: Evans Lance, MD;  Location[facility] CATH LAB;  Service: Cardiovascular;  Laterality: N/A;  . CARDIAC DEFIBRILLATOR PLACEMENT     AICD Replaced-5/09 and 2014  . LEFT AND RIGHT HEART CATHETERIZATION WITH CORONARY ANGIOGRAM N/A 10/06/2013   Procedure: LEFT AND RIGHT HEART CATHETERIZATION WITH CORONARY ANGIOGRAM;  Surgeon: Jolaine Artist, MD;  Location: [facility] CATH LAB;  Service: Cardiovascular;  Laterality: N/A;  . LITHOTRIPSY     h/o several procedures   . MASTECTOMY     B  . OOPHORECTOMY     B, per genticists advise at [place]  . VENOGRAM N/A 10/20/2012   Procedure: VENOGRAM;  Surgeon: Deboraha Sprang, MD;  Location: [facility] CATH LAB;  Service: Cardiovascular;  Laterality: N/A;     Social History   Socioeconomic History  . Marital status: Married    Spouse name: Not on file  . Number of children: 3  . Years of education: Not on file  . Highest education level: Not on file  Social Needs  . Financial resource strain: Not on file  . Food insecurity - worry: Not on file  . Food insecurity - inability: Not on file  . Transportation needs - medical: Not on file  . Transportation needs - non-medical: Not on file  Occupational History  . 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 Smoking status: Never Smoker  . Smokeless tobacco: Never Used  Substance and Sexual Activity  . Alcohol use: Yes    Comment: socially/occassionally  . Drug use: No  . Sexual activity: Not on file  Other Topics Concern  . Not on file  Social History Narrative   Related to Mr and Mrs Huston Foley, they are my patients as well    Married, 3 children all in [place], 7 Gkids            Allergies as of 11/05/2017      Reactions   Atacand [candesartan] Other (See Comments)   Causes fatigue   Cephalexin Hives, Itching   Latex Rash      Medication List  Accurate as of 11/05/17  4:56 PM. Always use your most recent med list.          ascorbic acid 1000 MG tablet Commonly known as:  VITAMIN C Take 1,000 mg by mouth daily.   aspirin 81 MG tablet Take 4 tablets (325 mg total) by mouth daily.   atorvastatin 40 MG tablet Commonly known as:  LIPITOR Take 1 tablet (40 mg total) by mouth daily.   Biotin 5000 MCG Caps Take 5,000 mcg by mouth daily.   carvedilol 12.5 MG tablet Commonly known as:  COREG Take 1 tablet (12.5 mg total) 2 (two) times daily with a meal by mouth.   clopidogrel 75 MG tablet Commonly known as:  PLAVIX Take 1 tablet (75 mg total) by mouth daily.   digoxin 0.125 MG tablet Commonly known as:  LANOXIN Take 1 tablet (125 mcg total) by mouth daily.   furosemide 40 MG tablet Commonly  known as:  LASIX TAKE 2 TABLETS BY MOUTH EVERY MORNING AND 1 TABLET EVERY EVENING   gabapentin 300 MG capsule Commonly known as:  NEURONTIN Take 300 mg by mouth daily as needed (for shingles flares).   multivitamin per tablet Take 1 tablet by mouth daily.   pantoprazole 40 MG tablet Commonly known as:  PROTONIX Take 1 tablet (40 mg total) daily by mouth.   potassium chloride 10 MEQ tablet Commonly known as:  K-DUR TAKE 3 TABLETS (30 MEQ TOTAL) BY MOUTH DAILY.   PROBIOTIC DAILY PO Take 1 capsule by mouth daily.   sacubitril-valsartan 24-26 MG Commonly known as:  ENTRESTO Take 1 tablet by mouth 2 (two) times daily.   sertraline 25 MG tablet Commonly known as:  ZOLOFT Take 1 tablet (25 mg total) by mouth daily.   spironolactone 25 MG tablet Commonly known as:  ALDACTONE Take 1 tablet (25 mg total) by mouth daily.   vitamin B-12 1000 MCG tablet Commonly known as:  CYANOCOBALAMIN Take 1,000 mcg by mouth daily.   zolpidem 10 MG tablet Commonly known as:  AMBIEN Take 1 tablet (10 mg total) by mouth at bedtime as needed for sleep.          Objective:   Physical Exam BP 124/68 (BP Location: Left Arm, Patient Position: Sitting, Cuff Size: Small)   Pulse 72   Temp 98.2 F (36.8 C) (Oral)   Resp 14   Ht 5\' 4"  (1.626 m)   Wt 180 lb 8 oz (81.9 kg)   SpO2 97%   BMI 30.98 kg/m  General:   Well developed, well nourished . NAD.  HEENT:  Normocephalic . Face symmetric, atraumatic Abdomen:  Not distended, soft, non-tender. No rebound or rigidity.  GU:  normal for age external examination without lesions. Vagina: no lesions or discharge Cervix: Normal appearing. Skin: Not pale. Not jaundice Neurologic:  alert & oriented X3.  Speech normal, gait appropriate for age and unassisted Psych--  Cognition and judgment appear intact.  Cooperative with normal attention span and concentration.  Behavior appropriate. No anxious or depressed appearing.     Assessment &  Plan:   Assessment Hyperglycemia A1c 5.9 (04/2017) CV:Dr. Caryl Comes and [facility] --CHF, nonischemic cardiomyopathy:due to chemotherapy --MR severe --VT -TIA 05-2017 Depression Mild OSA per sleep study 11-2015, saw Dr Radford Pax 02-20-16: no need for CPAP Osteopenia-- DEXA 06/13/2015 normal Vitamin D deficiency Kidney stones Menopause Breast cancer x 2: first in 1982 with chest wall involvement, 2nd on the R dx  1987, s/p B mastectomy Handicap sticker signed [phone] Post  herpetic neuralgia (shingles 2014) , R face-- gaba prn  PLAN: CHF: Good compliance with all medications, recently cardiology decrease aspirin dose and added Plavix.  No evidence of bleeding.  Check a BMP Preventive care: Pap smear HPV today.  If negative no further cervical ca screening.   RTC 6-8 months.

## 2017-11-05 NOTE — Patient Instructions (Addendum)
GO TO THE LAB : Get the blood work     GO TO THE FRONT DESK Schedule your next appointment for a routine checkup in 6-8 months  Date of your last physical exam and flu shot 08/05/2017

## 2017-11-05 NOTE — Assessment & Plan Note (Signed)
CHF: Good compliance with all medications, recently cardiology decrease aspirin dose and added Plavix.  No evidence of bleeding.  Check a BMP Preventive care: Pap smear HPV today.  If negative no further cervical ca screening.   RTC 6-8 months.

## 2017-11-05 NOTE — Progress Notes (Signed)
Pre visit review using our clinic review tool, if applicable. No additional management support is needed unless otherwise documented below in the visit note. 

## 2017-11-06 LAB — CYTOLOGY - PAP
Diagnosis: NEGATIVE
HPV: NOT DETECTED

## 2017-11-13 ENCOUNTER — Telehealth: Payer: Self-pay | Admitting: Gastroenterology

## 2017-11-13 NOTE — Telephone Encounter (Signed)
Dr. Danis will you accept this patient? °

## 2017-11-13 NOTE — Telephone Encounter (Signed)
OK 

## 2017-11-15 NOTE — Telephone Encounter (Signed)
Absolutely.

## 2017-11-27 ENCOUNTER — Telehealth (HOSPITAL_COMMUNITY): Payer: Self-pay | Admitting: Pharmacist

## 2017-11-27 NOTE — Telephone Encounter (Signed)
Called Ms. Shor and left VM to call me back if [facility] copays are still too costly for her this year as I can enroll her in PANF to assist with her copay costs.   Ruta Hinds. Velva Harman, PharmD, BCPS, CPP Clinical Pharmacist Pager: [phone] Phone: [phone] 11/27/2017 4:59 PM

## 2017-12-08 ENCOUNTER — Ambulatory Visit: Payer: Medicare Other | Admitting: Gastroenterology

## 2017-12-08 ENCOUNTER — Encounter: Payer: Self-pay | Admitting: Gastroenterology

## 2017-12-08 VITALS — BP 88/50 | HR 80 | Ht 64.0 in | Wt 182.0 lb

## 2017-12-08 DIAGNOSIS — Z8719 Personal history of other diseases of the digestive system: Secondary | ICD-10-CM | POA: Diagnosis not present

## 2017-12-08 DIAGNOSIS — R1031 Right lower quadrant pain: Secondary | ICD-10-CM | POA: Diagnosis not present

## 2017-12-08 NOTE — Patient Instructions (Signed)
If you are age 69 or older, your body mass index should be between 23-30. Your Body mass index is 31.24 kg/m. If this is out of the aforementioned range listed, please consider follow up with your Primary Care Provider.  If you are age 71 or younger, your body mass index should be between 19-25. Your Body mass index is 31.24 kg/m. If this is out of the aformentioned range listed, please consider follow up with your Primary Care Provider.   Follow up [phone]  Thank you for choosing [facility]  Dr Wilfrid Lund III

## 2017-12-08 NOTE — Progress Notes (Signed)
[facility] Consult Note:  History: [name] 12/08/2017  Referring physician: Colon Branch, MD  Reason for consult/chief complaint: establish gi care (patient has no GI complaints at this time)   Subjective  HPI:  This is a 67 year old woman self-referred to establish care for history of diverticulitis. She was seen twice in 2013 by Dr. Deatra Ina for episodes of sigmoid diverticulitis. Colonoscopy January 2013 with left sided diverticulosis; no polyps.  She has felt well since then, with no episodes of diverticulitis requiring antibiotics.  She will occasionally have some crampy right lower quadrant pain that might radiate across to the left lower quadrant.  She thinks these may be episodes of "early diverticulitis".  They seem to pass within 24 hours without associated change in bowel habits, rectal bleeding or fever.  She reports being up-to-date on gynecologic evaluation.  Her bowel habits are regular, she denies loss of appetite or rectal bleeding.  ROS:  Review of Systems  Constitutional: Negative for appetite change and unexpected weight change.  HENT: Negative for mouth sores and voice change.   Eyes: Negative for pain and redness.  Respiratory: Negative for cough and shortness of breath.   Cardiovascular: Negative for chest pain and palpitations.  Genitourinary: Negative for dysuria and hematuria.  Musculoskeletal: Negative for arthralgias and myalgias.  Skin: Negative for pallor and rash.  Neurological: Negative for weakness and headaches.  Hematological: Negative for adenopathy.     Past Medical History: Past Medical History:  Diagnosis Date  . Breast CA ([place])    twice first on L breast 1982 w/ chest wall involvment, then a second breast ca on the R in 1987  . Chronic systolic CHF (congestive heart failure) ([place])    a. due to Adriamycin,s/p ICD;  b. 01/2013 Gen change and new LV lead - BSX Energen CRT-D BiV ICD, Ser # [id number]  .  Depression   . Diverticulitis   . Early menopause    early 9s  . Fatigue 02/20/2016  . Glaucoma suspect of both eyes    Dr. Marshall Cork  . Nonischemic cardiomyopathy ([place])   . OSA (obstructive sleep apnea) 02/07/2016  . Osteopenia   . Renal calculus   . Snoring 02/20/2016     Past Surgical History: Past Surgical History:  Procedure Laterality Date  . BI-VENTRICULAR IMPLANTABLE CARDIOVERTER DEFIBRILLATOR UPGRADE N/A 02/04/2013   Procedure: BI-VENTRICULAR IMPLANTABLE CARDIOVERTER DEFIBRILLATOR UPGRADE;  Surgeon: Evans Lance, MD;  Location: [facility] CATH LAB;  Service: Cardiovascular;  Laterality: N/A;  . CARDIAC DEFIBRILLATOR PLACEMENT     AICD Replaced-5/09 and 2014  . LEFT AND RIGHT HEART CATHETERIZATION WITH CORONARY ANGIOGRAM N/A 10/06/2013   Procedure: LEFT AND RIGHT HEART CATHETERIZATION WITH CORONARY ANGIOGRAM;  Surgeon: Jolaine Artist, MD;  Location: [facility] CATH LAB;  Service: Cardiovascular;  Laterality: N/A;  . LITHOTRIPSY     h/o several procedures   . MASTECTOMY     B  . OOPHORECTOMY     B, per genticists advise at [place]  . VENOGRAM N/A 10/20/2012   Procedure: VENOGRAM;  Surgeon: Deboraha Sprang, MD;  Location: [facility] CATH LAB;  Service: Cardiovascular;  Laterality: N/A;     Family History: Family History  Problem Relation Age of Onset  . Kidney cancer Brother   . Breast cancer Mother        M and sister   . Lung cancer Father   . Breast cancer Sister   . Heart attack Neg Hx   . Diabetes  Neg Hx   . Colon cancer Neg Hx     Social History: Social History   Socioeconomic History  . Marital status: Married    Spouse name: None  . Number of children: 3  . Years of education: None  . Highest education level: None  Social Needs  . Financial resource strain: None  . Food insecurity - worry: None  . Food insecurity - inability: None  . Transportation needs - medical: None  . Transportation needs - non-medical: None  Occupational History  .  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 Smoking status: Never Smoker  . Smokeless tobacco: Never Used  Substance and Sexual Activity  . Alcohol use: Yes    Comment: socially/occassionally  . Drug use: No  . Sexual activity: None  Other Topics Concern  . None  Social History Narrative   Related to Mr and Mrs Huston Foley, they are my patients as well    Married, 3 children all in [place], 7 Gkids          Allergies: Allergies  Allergen Reactions  . Atacand [Candesartan] Other (See Comments)    Causes fatigue   . Cephalexin Hives and Itching  . Latex Rash    Outpatient Meds: Current Outpatient Medications  Medication Sig Dispense Refill  . ascorbic acid (VITAMIN C) 1000 MG tablet Take 1,000 mg by mouth daily.    Marland Kitchen aspirin 81 MG tablet Take 4 tablets (325 mg total) by mouth daily. 30 tablet 3  . atorvastatin (LIPITOR) 40 MG tablet Take 1 tablet (40 mg total) by mouth daily. 30 tablet 11  . Biotin 5000 MCG CAPS Take 5,000 mcg by mouth daily.     . carvedilol (COREG) 12.5 MG tablet Take 1 tablet (12.5 mg total) 2 (two) times daily with a meal by mouth. 180 tablet 1  . clopidogrel (PLAVIX) 75 MG tablet Take 1 tablet (75 mg total) by mouth daily. 90 tablet 3  . digoxin (LANOXIN) 0.125 MG tablet Take 1 tablet (125 mcg total) by mouth daily. 30 tablet 6  . furosemide (LASIX) 40 MG tablet TAKE 2 TABLETS BY MOUTH EVERY MORNING AND 1 TABLET EVERY EVENING (Patient taking differently: Take 40-80 mg by mouth See admin instructions. 80 mg in the morning and 40 mg in the evening) 270 tablet 3  . gabapentin (NEURONTIN) 300 MG capsule Take 300 mg by mouth daily as needed (for shingles flares).     . multivitamin (THERAGRAN) per tablet Take 1 tablet by mouth daily.     . pantoprazole (PROTONIX) 40 MG tablet Take 1 tablet (40 mg total) daily by mouth. 90 tablet 1  . potassium chloride (K-DUR) 10 MEQ tablet TAKE 3 TABLETS (30 MEQ TOTAL)  BY MOUTH DAILY. 270 tablet 3  . Probiotic Product (PROBIOTIC DAILY PO) Take 1 capsule by mouth daily.     . sacubitril-valsartan (ENTRESTO) 24-26 MG Take 1 tablet by mouth 2 (two) times daily. 60 tablet 5  . sertraline (ZOLOFT) 25 MG tablet Take 1 tablet (25 mg total) by mouth daily. 90 tablet 1  . spironolactone (ALDACTONE) 25 MG tablet Take 1 tablet (25 mg total) by mouth daily. 90 tablet 3  . vitamin B-12 (CYANOCOBALAMIN) 1000 MCG tablet Take 1,000 mcg by mouth daily.    Marland Kitchen zolpidem (AMBIEN) 10 MG tablet Take 1 tablet (10 mg total) by mouth at bedtime as needed for sleep. 90 tablet 1  No current facility-administered medications for this visit.       ___________________________________________________________________ Objective   Exam:  BP (!) 88/50   Pulse 80   Ht 5\' 4"  (1.626 m)   Wt 182 lb (82.6 kg)   BMI 31.24 kg/m    General: this is a(n) well-appearing woman  Eyes: sclera anicteric, no redness  ENT: oral mucosa moist without lesions, no cervical or supraclavicular lymphadenopathy, good dentition  CV: RRR without murmur, S1/S2, no JVD, no peripheral edema  Resp: clear to auscultation bilaterally, normal RR and effort noted  GI: soft, no tenderness, with active bowel sounds. No guarding or palpable organomegaly noted.  Skin; warm and dry, no rash or jaundice noted  Neuro: awake, alert and oriented x 3. Normal gross motor function and fluent speech  No recent data for review  Assessment: Encounter Diagnoses  Name Primary?  . RLQ abdominal pain Yes  . History of diverticulitis     She has not had a clinically apparent episode of diverticulitis for over 5 years.  The nature of the intermittent RLQ pain is unclear.  She is not due for screening colonoscopy until 2023.  Plan:  I have no other recommendations at this time.  She will see me as needed.  Thank you for the courtesy of this consult.  Please call me with any questions or concerns.  Nelida Meuse  III  CC: Colon Branch, MD

## 2017-12-23 ENCOUNTER — Other Ambulatory Visit (HOSPITAL_COMMUNITY): Payer: Self-pay | Admitting: *Deleted

## 2017-12-23 MED ORDER — DIGOXIN 125 MCG PO TABS: 125.0000 ug | ORAL_TABLET | Freq: Every day | ORAL | 3 refills | Status: DC

## 2017-12-30 ENCOUNTER — Telehealth: Payer: Self-pay | Admitting: Internal Medicine

## 2017-12-30 NOTE — Telephone Encounter (Signed)
Called and left a VM for [facility] as requested detailing that dates of P13 and P23. I left Pt's name and DOB and I also left the phone number that leads directly to my desk in the event she has any further questions or concerns.

## 2017-12-30 NOTE — Telephone Encounter (Signed)
Copied from [place]. Topic: Quick Communication - See Telephone Encounter >> Dec 30, 2017 10:40 AM Ivar Drape wrote: CRM for notification. See Telephone encounter for:  12/30/17. Shannon w/[employer] [phone] ex. [phone] wants to know the date of the patient's pneumonia vaccine. The answer can be left on her confidential voice mail.

## 2017-12-31 ENCOUNTER — Ambulatory Visit (HOSPITAL_COMMUNITY): Payer: Medicare Other | Attending: Internal Medicine

## 2017-12-31 ENCOUNTER — Other Ambulatory Visit (HOSPITAL_COMMUNITY): Payer: Self-pay | Admitting: Internal Medicine

## 2017-12-31 DIAGNOSIS — I5022 Chronic systolic (congestive) heart failure: Secondary | ICD-10-CM | POA: Diagnosis not present

## 2018-01-01 ENCOUNTER — Other Ambulatory Visit (HOSPITAL_COMMUNITY): Payer: Self-pay | Admitting: *Deleted

## 2018-01-01 DIAGNOSIS — I5022 Chronic systolic (congestive) heart failure: Secondary | ICD-10-CM | POA: Diagnosis not present

## 2018-01-04 ENCOUNTER — Ambulatory Visit (HOSPITAL_COMMUNITY)
Admission: RE | Admit: 2018-01-04 | Discharge: 2018-01-04 | Disposition: A | Discharge status: Home / Self Care | Payer: Medicare Other | Source: Ambulatory Visit | Attending: Internal Medicine | Admitting: Internal Medicine

## 2018-01-04 ENCOUNTER — Encounter (HOSPITAL_COMMUNITY): Payer: Self-pay | Admitting: Internal Medicine

## 2018-01-04 ENCOUNTER — Other Ambulatory Visit: Payer: Self-pay

## 2018-01-04 VITALS — BP 100/86 | HR 84 | Wt 183.4 lb

## 2018-01-04 DIAGNOSIS — F329 Major depressive disorder, single episode, unspecified: Secondary | ICD-10-CM | POA: Insufficient documentation

## 2018-01-04 DIAGNOSIS — R0602 Shortness of breath: Secondary | ICD-10-CM | POA: Diagnosis not present

## 2018-01-04 DIAGNOSIS — G4733 Obstructive sleep apnea (adult) (pediatric): Secondary | ICD-10-CM | POA: Diagnosis not present

## 2018-01-04 DIAGNOSIS — Z9221 Personal history of antineoplastic chemotherapy: Secondary | ICD-10-CM | POA: Diagnosis not present

## 2018-01-04 DIAGNOSIS — Z79899 Other long term (current) drug therapy: Secondary | ICD-10-CM | POA: Diagnosis not present

## 2018-01-04 DIAGNOSIS — Z8249 Family history of ischemic heart disease and other diseases of the circulatory system: Secondary | ICD-10-CM | POA: Insufficient documentation

## 2018-01-04 DIAGNOSIS — Z881 Allergy status to other antibiotic agents status: Secondary | ICD-10-CM | POA: Insufficient documentation

## 2018-01-04 DIAGNOSIS — Z7902 Long term (current) use of antithrombotics/antiplatelets: Secondary | ICD-10-CM | POA: Insufficient documentation

## 2018-01-04 DIAGNOSIS — Z9012 Acquired absence of left breast and nipple: Secondary | ICD-10-CM | POA: Insufficient documentation

## 2018-01-04 DIAGNOSIS — I5022 Chronic systolic (congestive) heart failure: Secondary | ICD-10-CM | POA: Insufficient documentation

## 2018-01-04 DIAGNOSIS — Z7901 Long term (current) use of anticoagulants: Secondary | ICD-10-CM | POA: Diagnosis not present

## 2018-01-04 DIAGNOSIS — Z95810 Presence of automatic (implantable) cardiac defibrillator: Secondary | ICD-10-CM | POA: Insufficient documentation

## 2018-01-04 DIAGNOSIS — M8580 Other specified disorders of bone density and structure, unspecified site: Secondary | ICD-10-CM | POA: Diagnosis not present

## 2018-01-04 DIAGNOSIS — Z853 Personal history of malignant neoplasm of breast: Secondary | ICD-10-CM | POA: Diagnosis not present

## 2018-01-04 DIAGNOSIS — Z9889 Other specified postprocedural states: Secondary | ICD-10-CM | POA: Insufficient documentation

## 2018-01-04 DIAGNOSIS — Z87442 Personal history of urinary calculi: Secondary | ICD-10-CM | POA: Diagnosis not present

## 2018-01-04 DIAGNOSIS — Z8673 Personal history of transient ischemic attack (TIA), and cerebral infarction without residual deficits: Secondary | ICD-10-CM | POA: Diagnosis not present

## 2018-01-04 DIAGNOSIS — I429 Cardiomyopathy, unspecified: Secondary | ICD-10-CM | POA: Diagnosis not present

## 2018-01-04 DIAGNOSIS — Z7982 Long term (current) use of aspirin: Secondary | ICD-10-CM | POA: Diagnosis not present

## 2018-01-04 NOTE — Progress Notes (Signed)
ADVANCED HEART FAILURE CLINIC NOTE  Patient ID: [name], female   DOB: [birth year], 67 y.o.   MRN: [id number] PCP: Dr Larose Kells EP: Dr Caryl Comes  Subjective [name] is a 67 y/o woman with h/o chronic systolic presumed due to chemotherapy-related cardiomyopathy diagnosed with breast cancer in 1982 in [place]. Treated with chemo (including adriamycin)/XRT and L mastectomy. Had R breast cancer (unrelated) in 1987. Has been cancer free since. Denies HTN, HL, DM2.  Was first diagnosed with HF in 1988. Had left heart cath she thinks in 2009. 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 Echo 2009 EF 20% with moderate AI and mild to moderate MR. Had [place] ICD placed and then LV lead became nonfunctional. In 3/14, underwent CRT revision.   Previously on coumadin due to cardiomyopathy but discontinued. Had sleep study 1/17 very mild OSA (6.2/hr) not using CPAP.   Admitted in 6/18 withTIA. Unable to use right hand and speech slurred. CT normal. Unable to do MRI.   She is here for routine f/u. Overall stable. Able to do all ADLs without problem but gets SOB if does more. Notes that she has more frequent orthostasis when standing. SBP 80-90s. No edema, orthopnea or PND. Had CPX earlier this month and it was much improved. Tasking lasix 80/40  ECHO 09/2013 with Dr. Wynonia Lawman and EF 10-15% with significant dyssynchrony. Subsequently had LV lead checked and was functioning well.  ECHO 7/15 EF 10-15%, moderate to severe RV dysfunction ECHO 4/16 EF 15% normal RV moderate AI ECHO 9/17 EF 20-25% RV ok. Severe MR mild AI/AS Echo 6/18 EF 20-25% RV ok Mild to moderate AI   CPX 2/19 FVC 3.41 (117%)    FEV1 2.45 (108%)     FEV1/FVC 72 (92%)     MVV 78 (89%)    Resting HR: 74 Peak HR: 121  (80% age predicted max HR) BP rest: 108/56 BP peak: 136/58 Peak VO2: 18.3 (103% predicted peak VO2) VE/VCO2 slope: 30 OUES: 2.45 Peak RER: 0.95   CPX 10/17  FVC 3.44 (113%)     FEV1 2.32 (97%)     FEV1/FVC 67 (86%)     MVV 90 (101%)    Resting HR: 72 Peak HR: 112 (73% age predicted max HR)  BP rest: 104/64 BP peak: 146/64 Peak VO2: 16.0 (91% predicted peak VO2) VE/VCO2 slope: 34.6 OUES: 1.90 Peak RER: 1.10 Ventilatory Threshold: 13.5 (76.9% predicted or measured peak VO2) VE/MVV: 64% PETCO2 at peak: 29 O2pulse: 13  (130% predicted O2pulse)  LHC/RHC 10/06/13  RA = 11  RV = 54/9/13  PA = 53/27 (38)  PCW = 23  Fick cardiac output/index = 3.8/2.0  Thermo CO/CI = 3.4/1.8  PVR = 3.9 WU (Fick)  FA sat = 98%  PA sat = 62%, 64%  SVC = 59%  Coronaries no significant disease but there was a fistula from small D1 => PA.   CPX 10/25/13  Peak VO2: 15.6 (81.2% predicted peak VO2) VE/VCO2 slope: 36.9 OUES: 1.25 Peak RER: 1.08  CPX 05/24/14 FVC 3.05 (92%)  FEV1 2.23 (88%)  FEV1/FVC 73%  MVV 92 (100%) Resting HR: 64 Peak HR: 132 (85% age predicted max HR) BP rest: 108/70 BP peak: 130/66  Peak VO2: 16.4 (81.1% predicted peak VO2) VE/VCO2 slope: 33.6 OUES: 1.74 Peak RER: 1.16 Ventilatory Threshold: 12.5 (61.8% predicted peak VO2) VE/MVV: 62% PETCO2 at peak: 31 O2pulse: 12 (109% predicted O2pulse)    Lab 10/06/13 K 2.8 Creatinine 0.83 Labs 10/31/13 K  3.0 Creatinine 0.68  Dig level 0.9 Pro BNP 3906  Labs 11/11/13  K 3.6 Creatinine 0.8 Labs 12/16/13 K 3.8 Creatinine 0.8  Labs 4/15 K 4, creatinine 0.8, LDL 128, TSH normal, digoxin 0.6 Labs 6/15 K 4.1 Cr 0.8 digoxin 0.7 Labs 8/15 K 4.2, creatinine 0.78, pBNP 2126 Labs 8/15 K 4.2, creatinine 1.0 dig <0.5   Blood type A+   FHX: Had older brother with HF in his 33s. No strong FHx of HF.   SocHx: Retired. Non-smoker. Rare ETOH. 3 grown kids.    Past Medical History:  Diagnosis Date  . Breast CA ([place])    twice first on L breast 1982 w/ chest wall involvment, then a second breast ca on the R in 1987  . Chronic systolic CHF (congestive heart failure) ([place])    a. due to Adriamycin,s/p  ICD;  b. 01/2013 Gen change and new LV lead - BSX [employer] CRT-D BiV ICD, Ser # [id number]  . Depression   . Diverticulitis   . Early menopause    early 89s  . Fatigue 02/20/2016  . Glaucoma suspect of both eyes    Dr. Marshall Cork  . Nonischemic cardiomyopathy ([place])   . OSA (obstructive sleep apnea) 02/07/2016  . Osteopenia   . Renal calculus   . Snoring 02/20/2016    Past Surgical History:  Procedure Laterality Date  . BI-VENTRICULAR IMPLANTABLE CARDIOVERTER DEFIBRILLATOR UPGRADE N/A 02/04/2013   Procedure: BI-VENTRICULAR IMPLANTABLE CARDIOVERTER DEFIBRILLATOR UPGRADE;  Surgeon: Evans Lance, MD;  Location: [facility] CATH LAB;  Service: Cardiovascular;  Laterality: N/A;  . CARDIAC DEFIBRILLATOR PLACEMENT     AICD Replaced-5/09 and 2014  . LEFT AND RIGHT HEART CATHETERIZATION WITH CORONARY ANGIOGRAM N/A 10/06/2013   Procedure: LEFT AND RIGHT HEART CATHETERIZATION WITH CORONARY ANGIOGRAM;  Surgeon: Jolaine Artist, MD;  Location: [facility] CATH LAB;  Service: Cardiovascular;  Laterality: N/A;  . LITHOTRIPSY     h/o several procedures   . MASTECTOMY     B  . OOPHORECTOMY     B, per genticists advise at [place]  . VENOGRAM N/A 10/20/2012   Procedure: VENOGRAM;  Surgeon: Deboraha Sprang, MD;  Location: [facility] CATH LAB;  Service: Cardiovascular;  Laterality: N/A;    Current Outpatient Medications  Medication Sig Dispense Refill  . ascorbic acid (VITAMIN C) 1000 MG tablet Take 1,000 mg by mouth daily.    Marland Kitchen aspirin 81 MG tablet Take 4 tablets (325 mg total) by mouth daily. 30 tablet 3  . atorvastatin (LIPITOR) 40 MG tablet Take 1 tablet (40 mg total) by mouth daily. 30 tablet 11  . Biotin 5000 MCG CAPS Take 5,000 mcg by mouth daily.     . carvedilol (COREG) 12.5 MG tablet Take 1 tablet (12.5 mg total) 2 (two) times daily with a meal by mouth. 180 tablet 1  . clopidogrel (PLAVIX) 75 MG tablet Take 1 tablet (75 mg total) by mouth daily. 90 tablet 3  . digoxin (LANOXIN) 0.125 MG tablet Take 1  tablet (125 mcg total) by mouth daily. 90 tablet 3  . furosemide (LASIX) 40 MG tablet TAKE 2 TABLETS BY MOUTH EVERY MORNING AND 1 TABLET EVERY EVENING (Patient taking differently: Take 40-80 mg by mouth See admin instructions. 80 mg in the morning and 40 mg in the evening) 270 tablet 3  . gabapentin (NEURONTIN) 300 MG capsule Take 300 mg by mouth daily as needed (for shingles flares).     . multivitamin (THERAGRAN) per tablet Take 1 tablet by  mouth daily.     . pantoprazole (PROTONIX) 40 MG tablet Take 1 tablet (40 mg total) daily by mouth. 90 tablet 1  . potassium chloride (K-DUR) 10 MEQ tablet TAKE 3 TABLETS (30 MEQ TOTAL) BY MOUTH DAILY. 270 tablet 3  . Probiotic Product (PROBIOTIC DAILY PO) Take 1 capsule by mouth daily.     . sacubitril-valsartan (ENTRESTO) 24-26 MG Take 1 tablet by mouth 2 (two) times daily. 60 tablet 5  . sertraline (ZOLOFT) 25 MG tablet Take 1 tablet (25 mg total) by mouth daily. 90 tablet 1  . spironolactone (ALDACTONE) 25 MG tablet Take 1 tablet (25 mg total) by mouth daily. 90 tablet 3  . vitamin B-12 (CYANOCOBALAMIN) 1000 MCG tablet Take 1,000 mcg by mouth daily.    Marland Kitchen zolpidem (AMBIEN) 10 MG tablet Take 1 tablet (10 mg total) by mouth at bedtime as needed for sleep. 90 tablet 1   No current facility-administered medications for this encounter.     Allergies  Allergen Reactions  . Atacand [Candesartan] Other (See Comments)    Causes fatigue   . Cephalexin Hives and Itching  . Latex Rash   Orthostatics Sitting 122/58 Standing 120/60  Physical Exam BP 100/86   Pulse 84   Wt 183 lb 6.4 oz (83.2 kg)   SpO2 97%   BMI 31.48 kg/m  General:  Well appearing. No resp difficulty HEENT: normal Neck: supple. no JVD. Carotids 2+ bilat; no bruits. No lymphadenopathy or thryomegaly appreciated. Cor: PMI laterally displaced. Regular rate & rhythm. No rubs, gallops or murmurs. Lungs: clear Abdomen: soft, nontender, nondistended. No hepatosplenomegaly. No bruits or  masses. Good bowel sounds. Extremities: no cyanosis, clubbing, rash, edema Neuro: alert & orientedx3, cranial nerves grossly intact. moves all 4 extremities w/o difficulty. Affect pleasant  Assessment and  Plan  1) Chronic systolic HF: Nonischemic cardiomyopathy, possibly anthracycline cardiotoxicity.  LHC 11/14 without significant coronary disease.    Has [employer] CRT-D. ECHO 09/2013 EF 10-15%. Echo at [facility] 4/16 EF 15% moderate AI. Echo 9/17 EF 20-25%.  RV ok. Severe MR mild AI/AS  Seen in [facility] and will follow prn - Doing well NYHA II-III. Volume status stable - We reviewed CPX from last week and she continues to improve despite persistently low EF - Continues with orthostatic symptoms after starting Entresto but not orthostatic here (I checked personally) - Will cut lasix back to 40 bid  - Continue Entresto, carvedilol, digoxin, spironolactone at current doses. - Corlanor may be an option down the road if HR remains > 70.   - Has previously seen Dr. Stann Mainland at [facility] and will refer back as needed. (Blood type A positive). Given results of CPX testing no need for advanced therapies at thisp oint.  2) h/o bilateral breast CA in 1982 and 1987 (treated with Adriamycin in 1982) 3) ICD - follows with Dr. Caryl Comes 4) h/o TIA. - Continue ASA, Plavix and statin  ICD interrogated personally in clinic. No VT episodes.   Glori Bickers MD 01/04/2018

## 2018-01-04 NOTE — Progress Notes (Signed)
Medication Samples have been provided to the patient.  Drug name: [name]     Strength: 24-26mg       Qty: 2 [id number] Exp.Date: 6/21  Dosing instructions: Take 1 Tablet twice daily  The patient has been instructed regarding the correct time, dose, and frequency of taking this medication, including desired effects and most common side effects.   Darron Doom 12:41 PM 01/04/2018

## 2018-01-04 NOTE — Patient Instructions (Signed)
Decrease Lasix to 40 mg Twice Daily  Echocardiogram and follow up in 6 months, we will call you to schedule appointment.

## 2018-01-11 ENCOUNTER — Ambulatory Visit (INDEPENDENT_AMBULATORY_CARE_PROVIDER_SITE_OTHER): Payer: Medicare Other | Admitting: *Deleted

## 2018-01-11 DIAGNOSIS — I428 Other cardiomyopathies: Secondary | ICD-10-CM

## 2018-01-12 LAB — CUP PACEART REMOTE DEVICE CHECK
Battery Remaining Longevity: 54 mo
Battery Remaining Percentage: 90 %
Brady Statistic RV Percent Paced: 100 %
HIGH POWER IMPEDANCE MEASURED VALUE: 59 Ohm
Implantable Lead Implant Date: 20050701
Implantable Lead Implant Date: 20140314
Implantable Lead Location: 753859
Implantable Lead Location: 753860
Implantable Lead Model: 158
Implantable Lead Serial Number: 156416
Implantable Pulse Generator Implant Date: 20140314
Lead Channel Impedance Value: 951 Ohm
Lead Channel Pacing Threshold Amplitude: 0.8 V
Lead Channel Pacing Threshold Amplitude: 1.2 V
Lead Channel Pacing Threshold Amplitude: 1.4 V
Lead Channel Pacing Threshold Pulse Width: 0.4 ms
Lead Channel Pacing Threshold Pulse Width: 0.6 ms
Lead Channel Pacing Threshold Pulse Width: 0.6 ms
Lead Channel Setting Pacing Amplitude: 2 V
Lead Channel Setting Pacing Amplitude: 2.4 V
Lead Channel Setting Sensing Sensitivity: 0.5 mV
Lead Channel Setting Sensing Sensitivity: 1 mV
MDC IDC LEAD IMPLANT DT: 20050701
MDC IDC LEAD LOCATION: 753858
MDC IDC MSMT LEADCHNL RA IMPEDANCE VALUE: 462 Ohm
MDC IDC MSMT LEADCHNL RV IMPEDANCE VALUE: 456 Ohm
MDC IDC PG SERIAL: 111235
MDC IDC SESS DTM: 20190218051100
MDC IDC SET LEADCHNL LV PACING AMPLITUDE: 2.4 V
MDC IDC SET LEADCHNL LV PACING PULSEWIDTH: 0.6 ms
MDC IDC SET LEADCHNL RV PACING PULSEWIDTH: 0.6 ms
MDC IDC STAT BRADY RA PERCENT PACED: 0 %

## 2018-01-12 NOTE — Progress Notes (Signed)
Remote ICD transmission.   

## 2018-01-14 ENCOUNTER — Encounter: Payer: Self-pay | Admitting: Cardiology

## 2018-02-12 ENCOUNTER — Encounter: Payer: Self-pay | Admitting: Internal Medicine

## 2018-02-12 ENCOUNTER — Ambulatory Visit: Payer: Medicare Other | Admitting: Internal Medicine

## 2018-02-12 VITALS — BP 102/50 | HR 67 | Ht 64.0 in | Wt 180.0 lb

## 2018-02-12 DIAGNOSIS — I428 Other cardiomyopathies: Secondary | ICD-10-CM

## 2018-02-12 DIAGNOSIS — Z95810 Presence of automatic (implantable) cardiac defibrillator: Secondary | ICD-10-CM | POA: Diagnosis not present

## 2018-02-12 DIAGNOSIS — I5022 Chronic systolic (congestive) heart failure: Secondary | ICD-10-CM | POA: Diagnosis not present

## 2018-02-12 DIAGNOSIS — I447 Left bundle-branch block, unspecified: Secondary | ICD-10-CM

## 2018-02-12 MED ORDER — CARVEDILOL 12.5 MG PO TABS: 12.5000 mg | ORAL_TABLET | Freq: Two times a day (BID) | ORAL | 1 refills | Status: DC

## 2018-02-12 NOTE — Patient Instructions (Addendum)
Medication Instructions:  Your physician has recommended you make the following change in your medication:   1. Decrease your Carvedilol (Coreg) to 6.25mg  (half tablet) with breakfast, and    12.5mg  (one tablet) with dinner.  2. Take Spironolactone (Aldactone) before bedtime.  Labwork: Digoxin level, CBC and BMP  Testing/Procedures: None ordered.  Follow-Up: Your physician recommends that you schedule a follow-up appointment in:   One year with Chanetta Marshall, PA   Remote monitoring is used to monitor your ICD from home. This monitoring reduces the number of office visits required to check your device to one time per year. It allows [place] to keep an eye on the functioning of your device to ensure it is working properly. You are scheduled for a device check from home on 04/12/2018. You may send your transmission at any time that day. If you have a wireless device, the transmission will be sent automatically. After your physician reviews your transmission, you will receive a postcard with your next transmission date.   Any Other Special Instructions Will Be Listed Below (If Applicable).     If you need a refill on your cardiac medications before your next appointment, please call your pharmacy.

## 2018-02-12 NOTE — Progress Notes (Signed)
After watching   seen    Patient Care Team: Colon Branch, MD as PCP - General Bensimhon, Shaune Pascal, MD as Consulting Physician (Cardiology) Hortencia Pilar, MD as Consulting Physician (Ophthalmology)   HPI  [name] is a 67 y.o. female seen in followup for congestive heart failure in the setting of chemotherapy associated cardiomyopathy with previously implanted CRT. She did part of the MADIT CRT protocol; her LV lead had failed and was capped.  When she began to develop worsening symptoms of heart failure in the fall and in the winter 2014  she underwent CRT upgrade.   She has been followed by Dr. Wynonia Lawman and the heart failure clinic.  She has a history of a TIA.  9/18 aspirin was decreased to 81 and Plavix was added.  Heart failure notes from 12/18  were reviewed.   Breathing is at baseline.  She has mild shortness of breath.  She has had no chest pain.  She has significant lightheadedness.  Sometimes she is presyncopal.  Diuretics were reduced by heart failure but the symptoms have not really abated  DATE TEST EF   11/14 Echo   10-15 %   7/15 Echo   10-15 % Severe RV dysfn  9/17 Echo  20-25% AI mod MR severe  6/18 Echo  20-25% MR mild AI mod                Heart failure records reviewed  Date Cr K Dig  3/18  0.91 4.4 0.7  12/18 0.96 4.2 0.3 (6/18)   Date LDL  8/17 118         Past Medical History:  Diagnosis Date  . Breast CA ([place])    twice first on L breast 1982 w/ chest wall involvment, then a second breast ca on the R in 1987  . Chronic systolic CHF (congestive heart failure) ([place])    a. due to Adriamycin,s/p ICD;  b. 01/2013 Gen change and new LV lead - BSX [employer] CRT-D BiV ICD, Ser # [id number]  . Depression   . Diverticulitis   . Early menopause    early 77s  . Fatigue 02/20/2016  . Glaucoma suspect of both eyes    Dr. Marshall Cork  . Nonischemic cardiomyopathy ([place])   . OSA (obstructive sleep apnea) 02/07/2016  . Osteopenia   .  Renal calculus   . Snoring 02/20/2016    Past Surgical History:  Procedure Laterality Date  . BI-VENTRICULAR IMPLANTABLE CARDIOVERTER DEFIBRILLATOR UPGRADE N/A 02/04/2013   Procedure: BI-VENTRICULAR IMPLANTABLE CARDIOVERTER DEFIBRILLATOR UPGRADE;  Surgeon: Evans Lance, MD;  Location[facility] CATH LAB;  Service: Cardiovascular;  Laterality: N/A;  . CARDIAC DEFIBRILLATOR PLACEMENT     AICD Replaced-5/09 and 2014  . LEFT AND RIGHT HEART CATHETERIZATION WITH CORONARY ANGIOGRAM N/A 10/06/2013   Procedure: LEFT AND RIGHT HEART CATHETERIZATION WITH CORONARY ANGIOGRAM;  Surgeon: Jolaine Artist, MD;  Location: [facility] CATH LAB;  Service: Cardiovascular;  Laterality: N/A;  . LITHOTRIPSY     h/o several procedures   . MASTECTOMY     B  . OOPHORECTOMY     B, per genticists advise at [place]  . VENOGRAM N/A 10/20/2012   Procedure: VENOGRAM;  Surgeon: Deboraha Sprang, MD;  Location: [facility] CATH LAB;  Service: Cardiovascular;  Laterality: N/A;    Current Outpatient Medications  Medication Sig Dispense Refill  . ascorbic acid (VITAMIN C) 1000 MG tablet Take 1,000 mg by mouth daily.    Marland Kitchen aspirin  81 MG tablet Take 4 tablets (325 mg total) by mouth daily. 30 tablet 3  . atorvastatin (LIPITOR) 40 MG tablet Take 1 tablet (40 mg total) by mouth daily. 30 tablet 11  . Biotin 5000 MCG CAPS Take 5,000 mcg by mouth daily.     . carvedilol (COREG) 12.5 MG tablet Take 1 tablet (12.5 mg total) 2 (two) times daily with a meal by mouth. 180 tablet 1  . clopidogrel (PLAVIX) 75 MG tablet Take 1 tablet (75 mg total) by mouth daily. 90 tablet 3  . digoxin (LANOXIN) 0.125 MG tablet Take 1 tablet (125 mcg total) by mouth daily. 90 tablet 3  . furosemide (LASIX) 40 MG tablet TAKE 2 TABLETS BY MOUTH EVERY MORNING AND 1 TABLET EVERY EVENING (Patient taking differently: Take 40-80 mg by mouth See admin instructions. 80 mg in the morning and 40 mg in the evening) 270 tablet 3  . gabapentin (NEURONTIN) 300 MG capsule Take 300 mg by  mouth daily as needed (for shingles flares).     . multivitamin (THERAGRAN) per tablet Take 1 tablet by mouth daily.     . pantoprazole (PROTONIX) 40 MG tablet Take 1 tablet (40 mg total) daily by mouth. 90 tablet 1  . potassium chloride (K-DUR) 10 MEQ tablet TAKE 3 TABLETS (30 MEQ TOTAL) BY MOUTH DAILY. 270 tablet 3  . Probiotic Product (PROBIOTIC DAILY PO) Take 1 capsule by mouth daily.     . sacubitril-valsartan (ENTRESTO) 24-26 MG Take 1 tablet by mouth 2 (two) times daily. 60 tablet 5  . sertraline (ZOLOFT) 25 MG tablet Take 1 tablet (25 mg total) by mouth daily. 90 tablet 1  . spironolactone (ALDACTONE) 25 MG tablet Take 1 tablet (25 mg total) by mouth daily. 90 tablet 3  . vitamin B-12 (CYANOCOBALAMIN) 1000 MCG tablet Take 1,000 mcg by mouth daily.    Marland Kitchen zolpidem (AMBIEN) 10 MG tablet Take 1 tablet (10 mg total) by mouth at bedtime as needed for sleep. 90 tablet 1   No current facility-administered medications for this visit.     Allergies  Allergen Reactions  . Atacand [Candesartan] Other (See Comments)    Causes fatigue   . Cephalexin Hives and Itching  . Latex Rash    Review of Systems negative except from HPI and PMH  Physical Exam BP (!) 102/50   Pulse 67   Ht 5\' 4"  (1.626 m)   Wt 180 lb (81.6 kg)   SpO2 97%   BMI 30.90 kg/m  Well developed and nourished in no acute distress HENT normal Neck supple with JVP-flat Clear Regular rate and rhythm, 2/6 systolic  Abd-soft with active BS No Clubbing cyanosis edema Skin-warm and dry A & Oriented  Grossly normal sensory and motor function   ECG demonstrates P-synchronous/ Biv  pacing   Assessment and  Plan  Nonischemic cardiomyopathy/MR-severe  Congestive heart failure chronic systolic  Ventricular tachycardia-nonsustained  Hyperlipidemia  TIA  Lightheadedness/ presyncope    Implantable defibrillator-[employer]-CRT The patient's device was interrogated and the information was fully reviewed.  The  device was reprogrammed to improve battery longevity  I will be in touch with Dr. Reine Just regarding dual antiplatelet therapy for her TIA.  I am not sure of the indication.  Euvolemic continue current meds  Given her lightheadedness and presyncope, I have taken the liberty of asking her to take her Aldactone at night but also to decrease her morning carvedilol from 12.5--6.25  Nonsustained VT present     We  spent more than 50% of our >25 min visit in face to face counseling regarding the above

## 2018-02-13 LAB — CBC WITH DIFFERENTIAL/PLATELET
Basophils Absolute: 0 10*3/uL (ref 0.0–0.2)
Basos: 0 %
EOS (ABSOLUTE): 0.4 10*3/uL (ref 0.0–0.4)
Eos: 4 %
HEMOGLOBIN: 13.1 g/dL (ref 11.1–15.9)
Hematocrit: 38.4 % (ref 34.0–46.6)
Immature Grans (Abs): 0 10*3/uL (ref 0.0–0.1)
Immature Granulocytes: 0 %
LYMPHS ABS: 2.4 10*3/uL (ref 0.7–3.1)
Lymphs: 29 %
MCH: 30.3 pg (ref 26.6–33.0)
MCHC: 34.1 g/dL (ref 31.5–35.7)
MCV: 89 fL (ref 79–97)
MONOCYTES: 12 %
MONOS ABS: 1 10*3/uL — AB (ref 0.1–0.9)
NEUTROS ABS: 4.6 10*3/uL (ref 1.4–7.0)
Neutrophils: 55 %
Platelets: 279 10*3/uL (ref 150–379)
RBC: 4.32 x10E6/uL (ref 3.77–5.28)
RDW: 13.5 % (ref 12.3–15.4)
WBC: 8.3 10*3/uL (ref 3.4–10.8)

## 2018-02-13 LAB — BASIC METABOLIC PANEL
BUN / CREAT RATIO: 14 (ref 12–28)
BUN: 12 mg/dL (ref 8–27)
CO2: 21 mmol/L (ref 20–29)
CREATININE: 0.87 mg/dL (ref 0.57–1.00)
Calcium: 9.7 mg/dL (ref 8.7–10.3)
Chloride: 101 mmol/L (ref 96–106)
GFR calc Af Amer: 79 mL/min/{1.73_m2} (ref >59)
GFR calc non Af Amer: 69 mL/min/{1.73_m2} (ref >59)
GLUCOSE: 103 mg/dL — AB (ref 65–99)
POTASSIUM: 4.6 mmol/L (ref 3.5–5.2)
SODIUM: 142 mmol/L (ref 134–144)

## 2018-02-13 LAB — DIGOXIN LEVEL: Digoxin, Serum: 1.3 ng/mL — ABNORMAL HIGH (ref 0.5–0.9)

## 2018-02-15 ENCOUNTER — Telehealth: Payer: Self-pay

## 2018-02-15 NOTE — Telephone Encounter (Signed)
Reviewed digoxin level with Dr Caryl Comes. She is to begin taking her digoxin every other day. I spoke with [name] and explained to her about her increased digoxin levels. She verbalized understanding and had no additional questions.

## 2018-02-17 ENCOUNTER — Telehealth: Payer: Self-pay

## 2018-02-17 MED ORDER — DIGOXIN 125 MCG PO TABS: 0.0625 mg | ORAL_TABLET | Freq: Every day | ORAL | 3 refills | Status: DC

## 2018-02-17 NOTE — Telephone Encounter (Signed)
-----   Message from Deboraha Sprang, MD sent at 02/17/2018  8:56 AM EDT ----- Please Inform Patient that labs are normal x elevated dig level   redosed qod But probably better ( ie change) to 0.0625 daily  Thanks

## 2018-02-17 NOTE — Telephone Encounter (Signed)
Per Dr Caryl Comes recommendation based of increased digoxin level, digoxin was changed to 0.0625mg , half tablet, per day. Pt verbalized understanding.

## 2018-03-03 ENCOUNTER — Other Ambulatory Visit (HOSPITAL_COMMUNITY): Payer: Self-pay | Admitting: *Deleted

## 2018-03-03 MED ORDER — CLOPIDOGREL BISULFATE 75 MG PO TABS: 75.0000 mg | ORAL_TABLET | Freq: Every day | ORAL | 3 refills | Status: DC

## 2018-03-03 MED ORDER — SPIRONOLACTONE 25 MG PO TABS: 25.0000 mg | ORAL_TABLET | Freq: Every day | ORAL | 3 refills | Status: DC

## 2018-03-11 LAB — CUP PACEART INCLINIC DEVICE CHECK
Date Time Interrogation Session: 20190322040000
HighPow Impedance: 32 Ohm
HighPow Impedance: 61 Ohm
Implantable Lead Implant Date: 20050701
Implantable Lead Implant Date: 20050701
Implantable Lead Implant Date: 20140314
Implantable Lead Location: 753859
Implantable Lead Location: 753860
Implantable Lead Model: 158
Implantable Lead Model: 4196
Lead Channel Impedance Value: 476 Ohm
Lead Channel Impedance Value: 988 Ohm
Lead Channel Pacing Threshold Amplitude: 0.7 V
Lead Channel Pacing Threshold Amplitude: 1 V
Lead Channel Pacing Threshold Amplitude: 1.2 V
Lead Channel Pacing Threshold Pulse Width: 0.6 ms
Lead Channel Pacing Threshold Pulse Width: 0.6 ms
Lead Channel Sensing Intrinsic Amplitude: 18.2 mV
Lead Channel Sensing Intrinsic Amplitude: 25 mV
Lead Channel Setting Pacing Amplitude: 2 V
Lead Channel Setting Sensing Sensitivity: 0.5 mV
Lead Channel Setting Sensing Sensitivity: 1 mV
MDC IDC LEAD LOCATION: 753858
MDC IDC LEAD SERIAL: 156416
MDC IDC MSMT LEADCHNL RA PACING THRESHOLD PULSEWIDTH: 0.4 ms
MDC IDC MSMT LEADCHNL RA SENSING INTR AMPL: 4.2 mV
MDC IDC MSMT LEADCHNL RV IMPEDANCE VALUE: 502 Ohm
MDC IDC PG IMPLANT DT: 20140314
MDC IDC SET LEADCHNL LV PACING AMPLITUDE: 2 V
MDC IDC SET LEADCHNL LV PACING PULSEWIDTH: 0.6 ms
MDC IDC SET LEADCHNL RV PACING AMPLITUDE: 2.4 V
MDC IDC SET LEADCHNL RV PACING PULSEWIDTH: 0.6 ms
Pulse Gen Serial Number: 111235

## 2018-04-12 ENCOUNTER — Ambulatory Visit (INDEPENDENT_AMBULATORY_CARE_PROVIDER_SITE_OTHER): Payer: Medicare Other | Admitting: *Deleted

## 2018-04-12 DIAGNOSIS — I428 Other cardiomyopathies: Secondary | ICD-10-CM

## 2018-04-12 NOTE — Progress Notes (Signed)
Remote ICD transmission.   

## 2018-04-13 ENCOUNTER — Encounter: Payer: Self-pay | Admitting: Cardiology

## 2018-04-14 LAB — CUP PACEART REMOTE DEVICE CHECK
Battery Remaining Longevity: 54 mo
Brady Statistic RA Percent Paced: 0 %
Brady Statistic RV Percent Paced: 100 %
Date Time Interrogation Session: 20190520041200
HIGH POWER IMPEDANCE MEASURED VALUE: 54 Ohm
Implantable Lead Implant Date: 20050701
Implantable Lead Implant Date: 20140314
Implantable Lead Location: 753860
Implantable Lead Model: 4196
Implantable Lead Model: 5076
Implantable Pulse Generator Implant Date: 20140314
Lead Channel Impedance Value: 501 Ohm
Lead Channel Pacing Threshold Amplitude: 0.7 V
Lead Channel Pacing Threshold Amplitude: 1 V
Lead Channel Pacing Threshold Amplitude: 1.2 V
Lead Channel Pacing Threshold Pulse Width: 0.4 ms
Lead Channel Setting Pacing Amplitude: 2 V
Lead Channel Setting Pacing Amplitude: 2.4 V
Lead Channel Setting Pacing Pulse Width: 0.6 ms
Lead Channel Setting Sensing Sensitivity: 1 mV
MDC IDC LEAD IMPLANT DT: 20050701
MDC IDC LEAD LOCATION: 753858
MDC IDC LEAD LOCATION: 753859
MDC IDC LEAD SERIAL: 156416
MDC IDC MSMT BATTERY REMAINING PERCENTAGE: 86 %
MDC IDC MSMT LEADCHNL LV IMPEDANCE VALUE: 873 Ohm
MDC IDC MSMT LEADCHNL LV PACING THRESHOLD PULSEWIDTH: 0.6 ms
MDC IDC MSMT LEADCHNL RV IMPEDANCE VALUE: 452 Ohm
MDC IDC MSMT LEADCHNL RV PACING THRESHOLD PULSEWIDTH: 0.6 ms
MDC IDC SET LEADCHNL LV PACING AMPLITUDE: 2 V
MDC IDC SET LEADCHNL LV PACING PULSEWIDTH: 0.6 ms
MDC IDC SET LEADCHNL RV SENSING SENSITIVITY: 0.5 mV
Pulse Gen Serial Number: 111235

## 2018-04-20 ENCOUNTER — Other Ambulatory Visit: Payer: Self-pay | Admitting: Internal Medicine

## 2018-04-20 ENCOUNTER — Other Ambulatory Visit (HOSPITAL_COMMUNITY): Payer: Self-pay | Admitting: Cardiology

## 2018-04-25 ENCOUNTER — Telehealth: Payer: Self-pay | Admitting: Internal Medicine

## 2018-04-26 NOTE — Telephone Encounter (Signed)
Sent!

## 2018-04-26 NOTE — Telephone Encounter (Signed)
Pt is requesting refill on Ambien  Last OV: 11/05/2017 Last Fill: 07/01/2017 #90 and 1RF UDS: Not needed for Ambien per PCP  NCCR printed- no discrepancies noted- sent for scanning  Please advise.

## 2018-04-30 ENCOUNTER — Other Ambulatory Visit (HOSPITAL_COMMUNITY): Payer: Self-pay | Admitting: *Deleted

## 2018-04-30 MED ORDER — SACUBITRIL-VALSARTAN 24-26 MG PO TABS: 1.0000 | ORAL_TABLET | Freq: Two times a day (BID) | ORAL | 5 refills | Status: DC

## 2018-05-06 ENCOUNTER — Encounter: Payer: Self-pay | Admitting: Internal Medicine

## 2018-05-06 ENCOUNTER — Ambulatory Visit (INDEPENDENT_AMBULATORY_CARE_PROVIDER_SITE_OTHER): Payer: Medicare Other | Admitting: Internal Medicine

## 2018-05-06 VITALS — BP 118/74 | HR 68 | Temp 97.9°F | Resp 16 | Ht 64.0 in | Wt 181.4 lb

## 2018-05-06 DIAGNOSIS — I5022 Chronic systolic (congestive) heart failure: Secondary | ICD-10-CM | POA: Diagnosis not present

## 2018-05-06 DIAGNOSIS — F32A Depression, unspecified: Secondary | ICD-10-CM

## 2018-05-06 DIAGNOSIS — F329 Major depressive disorder, single episode, unspecified: Secondary | ICD-10-CM

## 2018-05-06 DIAGNOSIS — E785 Hyperlipidemia, unspecified: Secondary | ICD-10-CM

## 2018-05-06 DIAGNOSIS — R739 Hyperglycemia, unspecified: Secondary | ICD-10-CM | POA: Diagnosis not present

## 2018-05-06 LAB — HEMOGLOBIN A1C: Hgb A1c MFr Bld: 6.3 % (ref 4.6–6.5)

## 2018-05-06 LAB — AST: AST: 17 U/L (ref 0–37)

## 2018-05-06 LAB — LIPID PANEL
CHOL/HDL RATIO: 2
Cholesterol: 118 mg/dL (ref 0–200)
HDL: 52.3 mg/dL (ref >39.00)
LDL CALC: 49 mg/dL (ref 0–99)
NONHDL: 65.96
Triglycerides: 84 mg/dL (ref 0.0–149.0)
VLDL: 16.8 mg/dL (ref 0.0–40.0)

## 2018-05-06 LAB — ALT: ALT: 18 U/L (ref 0–35)

## 2018-05-06 NOTE — Progress Notes (Signed)
Pre visit review using our clinic review tool, if applicable. No additional management support is needed unless otherwise documented below in the visit note. 

## 2018-05-06 NOTE — Progress Notes (Signed)
Subjective:    Patient ID: [name], female    DOB: 1951/05/10, 67 y.o.   MRN: [id number]  DOS:  05/06/2018 Type of visit - description : f/u Interval history: In general feeling well. Good compliance with medications Recently has developed tinnitus, no HOH, request ear exam. Notes from cardiology and GI reviewed.  Wt Readings from Last 3 Encounters:  05/06/18 181 lb 6 oz (82.3 kg)  02/12/18 180 lb (81.6 kg)  01/04/18 183 lb 6.4 oz (83.2 kg)     Review of Systems Denies nausea, vomiting, no blood in the stools or in the urine.  Admits to easy bruising Past Medical History:  Diagnosis Date  . Breast CA ([place])    twice first on L breast 1982 w/ chest wall involvment, then a second breast ca on the R in 1987  . Chronic systolic CHF (congestive heart failure) ([place])    a. due to Adriamycin,s/p ICD;  b. 01/2013 Gen change and new LV lead - BSX Energen CRT-D BiV ICD, Ser # [id number]  . Depression   . Diverticulitis   . Early menopause    early 70s  . Fatigue 02/20/2016  . Glaucoma suspect of both eyes    Dr. Marshall Cork  . Nonischemic cardiomyopathy ([place])   . OSA (obstructive sleep apnea) 02/07/2016  . Osteopenia   . Renal calculus   . Snoring 02/20/2016    Past Surgical History:  Procedure Laterality Date  . BI-VENTRICULAR IMPLANTABLE CARDIOVERTER DEFIBRILLATOR UPGRADE N/A 02/04/2013   Procedure: BI-VENTRICULAR IMPLANTABLE CARDIOVERTER DEFIBRILLATOR UPGRADE;  Surgeon: Evans Lance, MD;  Location: [facility] CATH LAB;  Service: Cardiovascular;  Laterality: N/A;  . CARDIAC DEFIBRILLATOR PLACEMENT     AICD Replaced-5/09 and 2014  . LEFT AND RIGHT HEART CATHETERIZATION WITH CORONARY ANGIOGRAM N/A 10/06/2013   Procedure: LEFT AND RIGHT HEART CATHETERIZATION WITH CORONARY ANGIOGRAM;  Surgeon: Jolaine Artist, MD;  Location: [facility] CATH LAB;  Service: Cardiovascular;  Laterality: N/A;  . LITHOTRIPSY     h/o several procedures   . MASTECTOMY Bilateral   . OOPHORECTOMY  Bilateral   . VENOGRAM N/A 10/20/2012   Procedure: VENOGRAM;  Surgeon: Deboraha Sprang, MD;  Location: [facility] CATH LAB;  Service: Cardiovascular;  Laterality: N/A;    Social History   Socioeconomic History  . Marital status: Married    Spouse name: Not on file  . Number of children: 3  . Years of education: Not on file  . Highest education level: Not on file  Occupational History  . 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 Financial resource strain: Not on file  . Food insecurity:    Worry: Not on file    Inability: Not on file  . Transportation needs:    Medical: Not on file    Non-medical: Not on file  Tobacco Use  . Smoking status: Never Smoker  . Smokeless tobacco: Never Used  Substance and Sexual Activity  . Alcohol use: Yes    Comment: socially/occassionally  . Drug use: No  . Sexual activity: Not on file  Lifestyle  . Physical activity:    Days per week: Not on file    Minutes per session: Not on file  . Stress: Not on file  Relationships  . Social connections:    Talks on phone: Not on file    Gets together: Not on file    Attends religious service: Not on  file    Active member of club or organization: Not on file    Attends meetings of clubs or organizations: Not on file    Relationship status: Not on file  . Intimate partner violence:    Fear of current or ex partner: Not on file    Emotionally abused: Not on file    Physically abused: Not on file    Forced sexual activity: Not on file  Other Topics Concern  . Not on file  Social History Narrative   Related to Mr and Mrs Huston Foley, they are my patients as well    Married, 3 children all in [place], 7 Gkids            Allergies as of 05/06/2018      Reactions   Atacand [candesartan] Other (See Comments)   Causes fatigue   Cephalexin Hives, Itching   Latex Rash      Medication List        Accurate as of 05/06/18  1:04 PM. Always use  your most recent med list.          ascorbic acid 1000 MG tablet Commonly known as:  VITAMIN C Take 1,000 mg by mouth daily.   aspirin EC 81 MG tablet Take 81 mg by mouth daily.   atorvastatin 40 MG tablet Commonly known as:  LIPITOR Take 1 tablet (40 mg total) by mouth daily.   Biotin 5000 MCG Caps Take 5,000 mcg by mouth daily.   carvedilol 12.5 MG tablet Commonly known as:  COREG Take 1 tablet (12.5 mg total) by mouth 2 (two) times daily with a meal. Take 1/2 tablet (6.25mg ) in the morning with meals and 1 tab (12.5mg ) in the evening with meals   clopidogrel 75 MG tablet Commonly known as:  PLAVIX Take 1 tablet (75 mg total) by mouth daily.   digoxin 0.125 MG tablet Commonly known as:  LANOXIN Take 0.5 tablets (0.0625 mg total) by mouth daily.   furosemide 40 MG tablet Commonly known as:  LASIX TAKE 2 TABLETS BY MOUTH  EVERY MORNING AND 1 TABLET  BY MOUTH EVERY EVENING   gabapentin 300 MG capsule Commonly known as:  NEURONTIN Take 300 mg by mouth daily as needed (for shingles flares).   multivitamin per tablet Take 1 tablet by mouth daily.   pantoprazole 40 MG tablet Commonly known as:  PROTONIX Take 1 tablet (40 mg total) by mouth daily.   potassium chloride 10 MEQ tablet Commonly known as:  K-DUR,KLOR-CON TAKE 3 TABLETS BY MOUTH  DAILY   PROBIOTIC DAILY PO Take 1 capsule by mouth daily.   sacubitril-valsartan 24-26 MG Commonly known as:  ENTRESTO Take 1 tablet by mouth 2 (two) times daily.   sertraline 25 MG tablet Commonly known as:  ZOLOFT Take 1 tablet (25 mg total) by mouth daily.   spironolactone 25 MG tablet Commonly known as:  ALDACTONE Take 1 tablet (25 mg total) by mouth daily.   vitamin B-12 1000 MCG tablet Commonly known as:  CYANOCOBALAMIN Take 1,000 mcg by mouth daily.   zolpidem 10 MG tablet Commonly known as:  AMBIEN TAKE 1 TABLET BY MOUTH AT BEDTIME AS NEEDED FOR SLEEP          Objective:   Physical Exam BP 118/74 (BP  Location: Left Arm, Patient Position: Sitting, Cuff Size: Small)   Pulse 68   Temp 97.9 F (36.6 C) (Oral)   Resp 16   Ht 5\' 4"  (1.626 m)   Wt  181 lb 6 oz (82.3 kg)   SpO2 98%   BMI 31.13 kg/m  General:   Well developed, NAD, see BMI.  HEENT:  Normocephalic . Face symmetric, atraumatic Lungs:  CTA B Normal respiratory effort, no intercostal retractions, no accessory muscle use. Heart: RRR, + murmur.  No pretibial edema bilaterally  Skin: Not pale. Not jaundice Neurologic:  alert & oriented X3.  Speech normal, gait appropriate for age and unassisted Psych--  Cognition and judgment appear intact.  Cooperative with normal attention span and concentration.  Behavior appropriate. No anxious or depressed appearing.      Assessment & Plan:   Assessment Hyperglycemia A1c 5.9 (04/2017) CV:Dr. Caryl Comes and [facility] --CHF, nonischemic cardiomyopathy:due to chemotherapy --MR severe --VT -TIA 05-2017 Depression Mild OSA per sleep study 11-2015, saw Dr Radford Pax 02-20-16: no need for CPAP Osteopenia-- DEXA 06/13/2015 normal Vitamin D deficiency Kidney stones Menopause Breast cancer x 2: first in 1982 with chest wall involvement, 2nd on the R dx  1987, s/p B mastectomy Handicap sticker signed 06-2016 Post herpetic neuralgia (shingles 2014) , R face-- gaba prn  PLAN: Hyperglycemia diet controlled, check A1c Hyperlipidemia: On Lipitor, check a FLP, AST, ALT CHF: on aspirin, Plavix; plans to discuss with cardiology if  needs to continue with DAPT.   Also on  carvedilol (take only half tablet in the morning and 1 in the afternoon), Lasix, potassium, Entresto, Aldactone.  On digoxin, last level increased, dig dose decreased, checking levels today. Depression: Well-controlled on Zoloft. Tinnitus: Normal ear exam no HOH, recommend observation. Chronic RLQ: Saw GI, 11-2017, etiology unclear, they Rx observation and return to the office as needed RTC CPX 4 months

## 2018-05-06 NOTE — Patient Instructions (Signed)
GO TO THE LAB : Get the blood work     GO TO THE FRONT DESK Schedule your next appointment for a physical exam in 4 months.  No need to be fasting    

## 2018-05-06 NOTE — Assessment & Plan Note (Signed)
Hyperglycemia diet controlled, check A1c Hyperlipidemia: On Lipitor, check a FLP, AST, ALT CHF: on aspirin, Plavix; plans to discuss with cardiology if  needs to continue with DAPT.   Also on  carvedilol (take only half tablet in the morning and 1 in the afternoon), Lasix, potassium, Entresto, Aldactone.  On digoxin, last level increased, dig dose decreased, checking levels today. Depression: Well-controlled on Zoloft. Tinnitus: Normal ear exam no HOH, recommend observation. Chronic RLQ: Saw GI, 11-2017, etiology unclear, they Rx observation and return to the office as needed RTC CPX 4 months

## 2018-05-07 LAB — DIGOXIN LEVEL: Digoxin Level: 0.6 mcg/L — ABNORMAL LOW (ref 0.8–2.0)

## 2018-05-10 DIAGNOSIS — M65341 Trigger finger, right ring finger: Secondary | ICD-10-CM | POA: Diagnosis not present

## 2018-05-10 DIAGNOSIS — M79641 Pain in right hand: Secondary | ICD-10-CM | POA: Diagnosis not present

## 2018-05-11 ENCOUNTER — Encounter: Payer: Medicare Other | Admitting: Internal Medicine

## 2018-06-07 DIAGNOSIS — M79641 Pain in right hand: Secondary | ICD-10-CM | POA: Diagnosis not present

## 2018-06-07 DIAGNOSIS — M65341 Trigger finger, right ring finger: Secondary | ICD-10-CM | POA: Diagnosis not present

## 2018-06-09 ENCOUNTER — Other Ambulatory Visit: Payer: Self-pay

## 2018-06-09 NOTE — Patient Outreach (Signed)
[facility]) Care Management  06/09/2018  [name] 1951-02-07 [id number]   Medication Adherence call to [name] left a message for patient to call back patient is due on Atorvastatin 40 mg.[name] is showing past due under [place].   [facility] Management Direct Dial [phone]  Fax [phone] [email or web address]

## 2018-06-21 ENCOUNTER — Other Ambulatory Visit: Payer: Self-pay

## 2018-06-21 NOTE — Patient Outreach (Signed)
[facility]) Care Management  06/21/2018  JEAN ALEJOS 1951/11/15 [id number]   Medication Adherence call to Mrs. Malaya Cagley spoke with patient she said her husband has been in the hospital and did not have a chance to pick up her medication but, [facility] has it ready for her and will pick up in the next couple of days. Mrs. Fillion is showing past due under [place].  [facility] Management Direct Dial [phone]  Fax [phone] [email or web address]

## 2018-06-25 ENCOUNTER — Other Ambulatory Visit: Payer: Self-pay | Admitting: Internal Medicine

## 2018-06-25 MED ORDER — ATORVASTATIN CALCIUM 40 MG PO TABS: 40.0000 mg | ORAL_TABLET | Freq: Every day | ORAL | 3 refills | Status: DC

## 2018-06-25 NOTE — Telephone Encounter (Signed)
Copied from [place] #[id number]. Topic: Quick Communication - Rx Refill/Question >> Jun 25, 2018  2:09 PM Oliver Pila B wrote: Medication: atorvastatin (LIPITOR) 40 MG tablet [[id number]]  ENDED  Has the patient contacted their pharmacy? Yes.   (Agent: If no, request that the patient contact the pharmacy for the refill.) (Agent: If yes, when and what did the pharmacy advise?)  Preferred Pharmacy (with phone number or street name): [facility]  Agent: Please be advised that RX refills may take up to 3 business days. We ask that you follow-up with your pharmacy.

## 2018-06-25 NOTE — Telephone Encounter (Signed)
Rx sent 

## 2018-06-25 NOTE — Telephone Encounter (Signed)
Refill request for atorvastatin 40 mg tab, prescription expired on 05/21/18 By Historical provider  LOV  05/06/18 NOV  09/23/18  Dr. Cristine [facility] pharmacy [phone]

## 2018-07-12 ENCOUNTER — Ambulatory Visit (INDEPENDENT_AMBULATORY_CARE_PROVIDER_SITE_OTHER): Payer: Medicare Other | Admitting: *Deleted

## 2018-07-12 DIAGNOSIS — I428 Other cardiomyopathies: Secondary | ICD-10-CM | POA: Diagnosis not present

## 2018-07-13 ENCOUNTER — Encounter: Payer: Self-pay | Admitting: Internal Medicine

## 2018-07-13 ENCOUNTER — Encounter (HOSPITAL_COMMUNITY): Payer: Self-pay | Admitting: Internal Medicine

## 2018-07-13 ENCOUNTER — Ambulatory Visit (HOSPITAL_COMMUNITY)
Admission: RE | Admit: 2018-07-13 | Discharge: 2018-07-13 | Disposition: A | Discharge status: Home / Self Care | Payer: Medicare Other | Source: Ambulatory Visit | Attending: Internal Medicine | Admitting: Internal Medicine

## 2018-07-13 VITALS — BP 118/78 | HR 68 | Wt 183.8 lb

## 2018-07-13 DIAGNOSIS — I2510 Atherosclerotic heart disease of native coronary artery without angina pectoris: Secondary | ICD-10-CM | POA: Diagnosis not present

## 2018-07-13 DIAGNOSIS — Z79899 Other long term (current) drug therapy: Secondary | ICD-10-CM | POA: Insufficient documentation

## 2018-07-13 DIAGNOSIS — Z881 Allergy status to other antibiotic agents status: Secondary | ICD-10-CM | POA: Insufficient documentation

## 2018-07-13 DIAGNOSIS — H25013 Cortical age-related cataract, bilateral: Secondary | ICD-10-CM | POA: Diagnosis not present

## 2018-07-13 DIAGNOSIS — Z8249 Family history of ischemic heart disease and other diseases of the circulatory system: Secondary | ICD-10-CM | POA: Insufficient documentation

## 2018-07-13 DIAGNOSIS — G4733 Obstructive sleep apnea (adult) (pediatric): Secondary | ICD-10-CM | POA: Insufficient documentation

## 2018-07-13 DIAGNOSIS — I5022 Chronic systolic (congestive) heart failure: Secondary | ICD-10-CM | POA: Diagnosis not present

## 2018-07-13 DIAGNOSIS — H2513 Age-related nuclear cataract, bilateral: Secondary | ICD-10-CM | POA: Diagnosis not present

## 2018-07-13 DIAGNOSIS — Z7982 Long term (current) use of aspirin: Secondary | ICD-10-CM | POA: Diagnosis not present

## 2018-07-13 DIAGNOSIS — Z8673 Personal history of transient ischemic attack (TIA), and cerebral infarction without residual deficits: Secondary | ICD-10-CM | POA: Diagnosis not present

## 2018-07-13 DIAGNOSIS — Z7902 Long term (current) use of antithrombotics/antiplatelets: Secondary | ICD-10-CM | POA: Diagnosis not present

## 2018-07-13 DIAGNOSIS — Z95810 Presence of automatic (implantable) cardiac defibrillator: Secondary | ICD-10-CM

## 2018-07-13 DIAGNOSIS — Z9012 Acquired absence of left breast and nipple: Secondary | ICD-10-CM | POA: Insufficient documentation

## 2018-07-13 DIAGNOSIS — Z87442 Personal history of urinary calculi: Secondary | ICD-10-CM | POA: Insufficient documentation

## 2018-07-13 DIAGNOSIS — H353131 Nonexudative age-related macular degeneration, bilateral, early dry stage: Secondary | ICD-10-CM | POA: Diagnosis not present

## 2018-07-13 DIAGNOSIS — F329 Major depressive disorder, single episode, unspecified: Secondary | ICD-10-CM | POA: Insufficient documentation

## 2018-07-13 DIAGNOSIS — I428 Other cardiomyopathies: Secondary | ICD-10-CM | POA: Insufficient documentation

## 2018-07-13 DIAGNOSIS — Z853 Personal history of malignant neoplasm of breast: Secondary | ICD-10-CM | POA: Diagnosis not present

## 2018-07-13 DIAGNOSIS — H40013 Open angle with borderline findings, low risk, bilateral: Secondary | ICD-10-CM | POA: Diagnosis not present

## 2018-07-13 LAB — BASIC METABOLIC PANEL
ANION GAP: 8 (ref 5–15)
BUN: 6 mg/dL — ABNORMAL LOW (ref 8–23)
CO2: 24 mmol/L (ref 22–32)
Calcium: 9.1 mg/dL (ref 8.9–10.3)
Chloride: 106 mmol/L (ref 98–111)
Creatinine, Ser: 0.75 mg/dL (ref 0.44–1.00)
GFR calc non Af Amer: >60 mL/min (ref >60)
Glucose, Bld: 134 mg/dL — ABNORMAL HIGH (ref 70–99)
POTASSIUM: 3.7 mmol/L (ref 3.5–5.1)
Sodium: 138 mmol/L (ref 135–145)

## 2018-07-13 LAB — HM DIABETES EYE EXAM

## 2018-07-13 LAB — BRAIN NATRIURETIC PEPTIDE: B Natriuretic Peptide: 112.1 pg/mL — ABNORMAL HIGH (ref 0.0–100.0)

## 2018-07-13 NOTE — Progress Notes (Signed)
Remote ICD transmission.   

## 2018-07-13 NOTE — Progress Notes (Signed)
ADVANCED HEART FAILURE CLINIC NOTE  Patient ID: [name], female   DOB: 05-09-1951, 67 y.o.   MRN: [id number] PCP: Dr Larose Kells EP: Dr Caryl Comes  Subjective  [name] is a 67 y/o woman with h/o chronic systolic presumed due to chemotherapy-related cardiomyopathy diagnosed with breast cancer in 1982 in [place]. Treated with chemo (including adriamycin)/XRT and L mastectomy. Had R breast cancer (unrelated) in 1987. Has been cancer free since. Denies HTN, HL, DM2.  Was first diagnosed with HF in 1988. Had left heart cath she thinks in 2009. 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 Echo 2009 EF 20% with moderate AI and mild to moderate MR. Had [place] ICD placed and then LV lead became nonfunctional. In 3/14, underwent CRT revision.   Previously on coumadin due to cardiomyopathy but discontinued. Had sleep study 1/17 very mild OSA (6.2/hr) not using CPAP.   Admitted in 6/18 withTIA. Unable to use right hand and speech slurred. CT normal. Unable to do MRI.   She returns today for regular follow up. Last visit lasix was decreased to 40 mg BID with ongoing dizziness (orthostatics were negative). She saw Dr Caryl Comes and he decreased her coreg for ongoing dizziness. She has been feeling good overall. Hasn't needed any med adjustment since seeing Dr. Caryl Comes last. Able to do all ADLs without difficulty. She can walk as long as she wants/needs on flat ground at pace. Mild SOB up an incline or in a hurry. Her orthostasis has gotten significantly better and happens far less often. Denies edema, orthopnea, or PND. CPX in 3/19 looked good. Has not been back to [facility].   ICD interrogated personally: Active 3.2 hrs daily on average. Occasional NSVT noted. Avg HR 74, Avg RR 15  ECHO 09/2013 with Dr. Wynonia Lawman and EF 10-15% with significant dyssynchrony. Subsequently had LV lead checked and was functioning well.  ECHO 7/15 EF 10-15%, moderate to severe RV dysfunction ECHO 4/16 EF 15%  normal RV moderate AI ECHO 9/17 EF 20-25% RV ok. Severe MR mild AI/AS Echo 6/18 EF 20-25% RV ok Mild to moderate AI   CPX 2/19 FVC 3.41 (117%)    FEV1 2.45 (108%)     FEV1/FVC 72 (92%)     MVV 78 (89%)    Resting HR: 74 Peak HR: 121  (80% age predicted max HR) BP rest: 108/56 BP peak: 136/58 Peak VO2: 18.3 (103% predicted peak VO2) VE/VCO2 slope: 30 OUES: 2.45 Peak RER: 0.95  CPX 10/17  FVC 3.44 (113%)    FEV1 2.32 (97%)     FEV1/FVC 67 (86%)     MVV 90 (101%)    Resting HR: 72 Peak HR: 112 (73% age predicted max HR)  BP rest: 104/64 BP peak: 146/64 Peak VO2: 16.0 (91% predicted peak VO2) VE/VCO2 slope: 34.6 OUES: 1.90 Peak RER: 1.10 Ventilatory Threshold: 13.5 (76.9% predicted or measured peak VO2) VE/MVV: 64% PETCO2 at peak: 29 O2pulse: 13  (130% predicted O2pulse)  LHC/RHC 10/06/13  RA = 11  RV = 54/9/13  PA = 53/27 (38)  PCW = 23  Fick cardiac output/index = 3.8/2.0  Thermo CO/CI = 3.4/1.8  PVR = 3.9 WU (Fick)  FA sat = 98%  PA sat = 62%, 64%  SVC = 59%  Coronaries no significant disease but there was a fistula from small D1 => PA.   CPX 10/25/13  Peak VO2: 15.6 (81.2% predicted peak VO2) VE/VCO2 slope: 36.9 OUES: 1.25 Peak RER: 1.08  CPX 05/24/14 FVC  3.05 (92%)  FEV1 2.23 (88%)  FEV1/FVC 73%  MVV 92 (100%) Resting HR: 64 Peak HR: 132 (85% age predicted max HR) BP rest: 108/70 BP peak: 130/66  Peak VO2: 16.4 (81.1% predicted peak VO2) VE/VCO2 slope: 33.6 OUES: 1.74 Peak RER: 1.16 Ventilatory Threshold: 12.5 (61.8% predicted peak VO2) VE/MVV: 62% PETCO2 at peak: 31 O2pulse: 12 (109% predicted O2pulse)  Lab 10/06/13 K 2.8 Creatinine 0.83 Labs 10/31/13 K 3.0 Creatinine 0.68  Dig level 0.9 Pro BNP 3906  Labs 11/11/13  K 3.6 Creatinine 0.8 Labs 12/16/13 K 3.8 Creatinine 0.8  Labs 4/15 K 4, creatinine 0.8, LDL 128, TSH normal, digoxin 0.6 Labs 6/15 K 4.1 Cr 0.8 digoxin 0.7 Labs 8/15 K 4.2, creatinine 0.78, pBNP  2126 Labs 8/15 K 4.2, creatinine 1.0 dig <0.5  Blood type A+   FHX: Had older brother with HF in his 71s. No strong FHx of HF.   SocHx: Retired. Non-smoker. Rare ETOH. 3 grown kids.   Review of systems complete and found to be negative unless listed in HPI.    Past Medical History:  Diagnosis Date  . Breast CA ([place])    twice first on L breast 1982 w/ chest wall involvment, then a second breast ca on the R in 1987  . Chronic systolic CHF (congestive heart failure) ([place])    a. due to Adriamycin,s/p ICD;  b. 01/2013 Gen change and new LV lead - BSX Energen CRT-D BiV ICD, Ser # [id number]  . Depression   . Diverticulitis   . Early menopause    early 54s  . Fatigue 02/20/2016  . Glaucoma suspect of both eyes    Dr. Marshall Cork  . Nonischemic cardiomyopathy ([place])   . OSA (obstructive sleep apnea) 02/07/2016  . Osteopenia   . Renal calculus   . Snoring 02/20/2016    Past Surgical History:  Procedure Laterality Date  . BI-VENTRICULAR IMPLANTABLE CARDIOVERTER DEFIBRILLATOR UPGRADE N/A 02/04/2013   Procedure: BI-VENTRICULAR IMPLANTABLE CARDIOVERTER DEFIBRILLATOR UPGRADE;  Surgeon: Evans Lance, MD;  Location: [facility] CATH LAB;  Service: Cardiovascular;  Laterality: N/A;  . CARDIAC DEFIBRILLATOR PLACEMENT     AICD Replaced-5/09 and 2014  . LEFT AND RIGHT HEART CATHETERIZATION WITH CORONARY ANGIOGRAM N/A 10/06/2013   Procedure: LEFT AND RIGHT HEART CATHETERIZATION WITH CORONARY ANGIOGRAM;  Surgeon: Jolaine Artist, MD;  Location: [facility] CATH LAB;  Service: Cardiovascular;  Laterality: N/A;  . LITHOTRIPSY     h/o several procedures   . MASTECTOMY Bilateral   . OOPHORECTOMY Bilateral   . VENOGRAM N/A 10/20/2012   Procedure: VENOGRAM;  Surgeon: Deboraha Sprang, MD;  Location: [facility] CATH LAB;  Service: Cardiovascular;  Laterality: N/A;    Current Outpatient Medications  Medication Sig Dispense Refill  . ascorbic acid (VITAMIN C) 1000 MG tablet Take 1,000 mg by mouth daily.    Marland Kitchen aspirin EC 81  MG tablet Take 81 mg by mouth daily.    Marland Kitchen atorvastatin (LIPITOR) 40 MG tablet Take 1 tablet (40 mg total) by mouth daily. 90 tablet 3  . Biotin 5000 MCG CAPS Take 5,000 mcg by mouth daily.     . carvedilol (COREG) 12.5 MG tablet Take 6.25-12.5 mg by mouth. Take 6.25 mg (1/2 tablet) every am, and 12.5 mg (1 tablet) every pm.    . clopidogrel (PLAVIX) 75 MG tablet Take 1 tablet (75 mg total) by mouth daily. 90 tablet 3  . digoxin (LANOXIN) 0.125 MG tablet Take 0.5 tablets (0.0625 mg total) by mouth daily. 90 tablet  3  . furosemide (LASIX) 40 MG tablet TAKE 2 TABLETS BY MOUTH  EVERY MORNING AND 1 TABLET  BY MOUTH EVERY EVENING 270 tablet 3  . gabapentin (NEURONTIN) 300 MG capsule Take 300 mg by mouth daily as needed (for shingles flares).     . multivitamin (THERAGRAN) per tablet Take 1 tablet by mouth daily.     . pantoprazole (PROTONIX) 40 MG tablet Take 1 tablet (40 mg total) by mouth daily. 90 tablet 2  . potassium chloride (K-DUR,KLOR-CON) 10 MEQ tablet TAKE 3 TABLETS BY MOUTH  DAILY 270 tablet 0  . Probiotic Product (PROBIOTIC DAILY PO) Take 1 capsule by mouth daily.     . sacubitril-valsartan (ENTRESTO) 24-26 MG Take 1 tablet by mouth 2 (two) times daily. 60 tablet 5  . sertraline (ZOLOFT) 25 MG tablet Take 1 tablet (25 mg total) by mouth daily. 90 tablet 1  . spironolactone (ALDACTONE) 25 MG tablet Take 1 tablet (25 mg total) by mouth daily. 90 tablet 3  . zolpidem (AMBIEN) 10 MG tablet TAKE 1 TABLET BY MOUTH AT BEDTIME AS NEEDED FOR SLEEP 90 tablet 0  . vitamin B-12 (CYANOCOBALAMIN) 1000 MCG tablet Take 1,000 mcg by mouth daily.     No current facility-administered medications for this encounter.    Allergies  Allergen Reactions  . Atacand [Candesartan] Other (See Comments)    Causes fatigue   . Cephalexin Hives and Itching  . Latex Rash   Physical Exam  Vitals:   07/13/18 1306  BP: 118/78  Pulse: 68  SpO2: 98%  Weight: 83.4 kg (183 lb 12.8 oz)   Wt Readings from Last 3  Encounters:  07/13/18 83.4 kg (183 lb 12.8 oz)  05/06/18 82.3 kg (181 lb 6 oz)  02/12/18 81.6 kg (180 lb)   General: Well appearing. No resp difficulty. HEENT: Normal. Anicteric  Neck: Supple. JVP 6-7 cm. Carotids 2+ bilat; no bruits. No thyromegaly or nodule noted. Cor: PMI laterally displaced. RRR, Faint aortic murmur, Normal S2 Lungs: CTAB, normal effort. No wheeze Abdomen: Soft, non-tender, non-distended, no HSM. No bruits or masses. +BS  Extremities: no cyanosis, clubbing, rash, edema Neuro: alert & oriented x 3, cranial nerves grossly intact. moves all 4 extremities w/o difficulty. Affect pleasant   Assessment and  Plan  1) Chronic systolic HF:  - Nonischemic cardiomyopathy, possibly anthracycline cardiotoxicity.  LHC 11/14 without significant coronary disease.    Has [employer] CRT-D. ECHO 09/2013 EF 10-15%. [facility] 4/16 EF 15% moderate AI. Echo 9/17 EF 20-25%.  RV ok. Severe MR mild AI/AS  Seen in [facility] and will follow prn - Echo 04/2017 LVEF 20-25%, Grade 1 DD. - NYHA II-III symptoms.  - Volume status stable on exam.  - Continue lasix 80 mg q am and 40 mg q pm.  - Continue Entresto 24/26 mg BID, carvedilol 6.25 mg BID, digoxin, spironolactone 25 mg daily. Has not been able to tolerate higher doses due to low BP - No room for corlanor.  - Has previously seen Dr. Stann Mainland at [facility] and will refer back as needed. (Blood type A positive). Given results of CPX testing no need for advanced therapies at this point.  2) h/o bilateral breast CA in 1982 and 1987 (treated with Adriamycin in 1982) - Stable 3) ICD  - follows with Dr. Caryl Comes 4) h/o TIA.  - Continue Plavix and statin.  - Stop ASA  Shirley Friar, PA-C 07/13/2018  Patient seen and examined with the above-signed Advanced  Practice Provider and/or Housestaff. I personally reviewed laboratory data, imaging studies and relevant notes. I independently examined the patient and formulated the  important aspects of the plan. I have edited the note to reflect any of my changes or salient points. I have personally discussed the plan with the patient and/or family.  Continues to do well despite markedly depressed LVEF. Recent CPX improved. NYHA II-III. Volume status ok. Unable to tolerate higher doses of meds. We discussed fact that she is close to aging out of transplant eligibility but currently doesn't qualify from functional standpoint. We discussed LVAD as possibility dow the road and she said she would likely not be interested in that. We will continue to follow. ICD interrogated in clinic. No VT/VF. Parameters stable.  Glori Bickers, MD  11:56 PM

## 2018-07-13 NOTE — Progress Notes (Signed)
Medication Samples have been provided to the patient.  Drug name: entresto       Strength: 24/26 mg        Qty: 1 LOT: [id number]  Exp.Date: 10/21  Dosing instructions: 1 tab Twice daily   The patient has been instructed regarding the correct time, dose, and frequency of taking this medication, including desired effects and most common side effects.   Veria Stradley 1:54 PM 07/13/2018

## 2018-07-13 NOTE — Patient Instructions (Addendum)
Stop Aspirin   Labs today  Your physician has requested that you have an echocardiogram. Echocardiography is a painless test that uses sound waves to create images of your heart. It provides your doctor with information about the size and shape of your heart and how well your heart's chambers and valves are working. This procedure takes approximately one hour. There are no restrictions for this procedure.  IN December  We will contact you in 6 months to schedule your next appointment.

## 2018-07-19 ENCOUNTER — Encounter: Payer: Self-pay | Admitting: Internal Medicine

## 2018-07-19 DIAGNOSIS — C44629 Squamous cell carcinoma of skin of left upper limb, including shoulder: Secondary | ICD-10-CM | POA: Diagnosis not present

## 2018-08-04 ENCOUNTER — Other Ambulatory Visit (HOSPITAL_COMMUNITY): Payer: Self-pay | Admitting: Cardiology

## 2018-08-16 LAB — CUP PACEART REMOTE DEVICE CHECK
Implantable Lead Implant Date: 20050701
Implantable Lead Location: 753859
Implantable Lead Model: 4196
MDC IDC LEAD IMPLANT DT: 20050701
MDC IDC LEAD IMPLANT DT: 20140314
MDC IDC LEAD LOCATION: 753858
MDC IDC LEAD LOCATION: 753860
MDC IDC LEAD SERIAL: 156416
MDC IDC PG IMPLANT DT: 20140314
MDC IDC SESS DTM: 20190923142353
Pulse Gen Serial Number: 111235

## 2018-08-19 ENCOUNTER — Encounter: Payer: Medicare Other | Admitting: Internal Medicine

## 2018-08-26 ENCOUNTER — Other Ambulatory Visit: Payer: Self-pay | Admitting: Internal Medicine

## 2018-08-30 ENCOUNTER — Telehealth: Payer: Self-pay | Admitting: *Deleted

## 2018-08-30 NOTE — Telephone Encounter (Signed)
Per SK- increase VT-1 zone from 150bpm to 160bpm at next OV. Entered into recall appt notes, paceart notes.

## 2018-08-30 NOTE — Telephone Encounter (Signed)
Alert from ICD home monitor- ATP delivered for "VT" episodes. EGMs illustrate SVT ~150bpm 08/28/18 2:30-4pm. ' Patient [place] the [place] work, she did say that she knew she was working hard but did not feel poorly. I made her aware that the device treated the episodes inappropriately and that I would review them with Dr. Caryl Comes and call her back with any recommendations. She verbalizes understanding.

## 2018-09-06 DIAGNOSIS — M65341 Trigger finger, right ring finger: Secondary | ICD-10-CM | POA: Diagnosis not present

## 2018-09-06 DIAGNOSIS — M79641 Pain in right hand: Secondary | ICD-10-CM | POA: Diagnosis not present

## 2018-09-13 ENCOUNTER — Telehealth: Payer: Self-pay | Admitting: Internal Medicine

## 2018-09-13 NOTE — Telephone Encounter (Signed)
Pt requesting refill on Ambien.   Last OV: 05/06/2018 Last Fill: 04/26/2018 #90 and 0RF UDS: Not needed for Ambien per PCP

## 2018-09-13 NOTE — Telephone Encounter (Signed)
Sent!

## 2018-09-23 ENCOUNTER — Ambulatory Visit (INDEPENDENT_AMBULATORY_CARE_PROVIDER_SITE_OTHER): Payer: Medicare Other | Admitting: Internal Medicine

## 2018-09-23 ENCOUNTER — Encounter: Payer: Self-pay | Admitting: Internal Medicine

## 2018-09-23 VITALS — BP 122/80 | HR 76 | Temp 98.0°F | Resp 16 | Ht 64.0 in | Wt 182.0 lb

## 2018-09-23 DIAGNOSIS — R739 Hyperglycemia, unspecified: Secondary | ICD-10-CM | POA: Diagnosis not present

## 2018-09-23 DIAGNOSIS — I5022 Chronic systolic (congestive) heart failure: Secondary | ICD-10-CM | POA: Diagnosis not present

## 2018-09-23 DIAGNOSIS — Z23 Encounter for immunization: Secondary | ICD-10-CM | POA: Diagnosis not present

## 2018-09-23 DIAGNOSIS — Z0000 Encounter for general adult medical examination without abnormal findings: Secondary | ICD-10-CM | POA: Diagnosis not present

## 2018-09-23 LAB — CBC WITH DIFFERENTIAL/PLATELET
Basophils Absolute: 0 10*3/uL (ref 0.0–0.1)
Basophils Relative: 0.7 % (ref 0.0–3.0)
EOS ABS: 0.3 10*3/uL (ref 0.0–0.7)
EOS PCT: 4.1 % (ref 0.0–5.0)
HEMATOCRIT: 41.9 % (ref 36.0–46.0)
HEMOGLOBIN: 14.2 g/dL (ref 12.0–15.0)
LYMPHS PCT: 31.3 % (ref 12.0–46.0)
Lymphs Abs: 2.1 10*3/uL (ref 0.7–4.0)
MCHC: 33.8 g/dL (ref 30.0–36.0)
MCV: 91.9 fl (ref 78.0–100.0)
MONO ABS: 0.8 10*3/uL (ref 0.1–1.0)
Monocytes Relative: 11.8 % (ref 3.0–12.0)
NEUTROS ABS: 3.5 10*3/uL (ref 1.4–7.7)
Neutrophils Relative %: 52.1 % (ref 43.0–77.0)
PLATELETS: 272 10*3/uL (ref 150.0–400.0)
RBC: 4.56 Mil/uL (ref 3.87–5.11)
RDW: 13.1 % (ref 11.5–15.5)
WBC: 6.6 10*3/uL (ref 4.0–10.5)

## 2018-09-23 LAB — TSH: TSH: 1.61 u[IU]/mL (ref 0.35–4.50)

## 2018-09-23 LAB — HEMOGLOBIN A1C: HEMOGLOBIN A1C: 6.4 % (ref 4.6–6.5)

## 2018-09-23 MED ORDER — ZOSTER VAC RECOMB ADJUVANTED 50 MCG/0.5ML IM SUSR: 0.5000 mL | Freq: Once | INTRAMUSCULAR | 1 refills | Status: AC

## 2018-09-23 NOTE — Progress Notes (Signed)
Pre visit review using our clinic review tool, if applicable. No additional management support is needed unless otherwise documented below in the visit note. 

## 2018-09-23 NOTE — Patient Instructions (Signed)
GO TO THE LAB : Get the blood work     GO TO THE FRONT DESK Schedule your next appointment for a  Check up in 6-8 months  

## 2018-09-23 NOTE — Progress Notes (Signed)
Subjective:    Patient ID: [name], female    DOB: [birth year], 67 y.o.   MRN: [id number]  DOS:  09/23/2018 Type of visit - description : cpx Interval history: Since the last office visit she is doing well. Wonders if she could to stop Protonix.   Review of Systems   A 14 point review of systems is negative    Past Medical History:  Diagnosis Date  . Breast CA ([place])    twice first on L breast 1982 w/ chest wall involvment, then a second breast ca on the R in 1987  . Chronic systolic CHF (congestive heart failure) ([place])    a. due to Adriamycin,s/p ICD;  b. 01/2013 Gen change and new LV lead - BSX Energen CRT-D BiV ICD, Ser # [id number]  . Depression   . Diverticulitis   . Early menopause    early 73s  . Fatigue 02/20/2016  . Glaucoma suspect of both eyes    Dr. Marshall Cork  . Nonischemic cardiomyopathy ([place])   . OSA (obstructive sleep apnea) 02/07/2016  . Osteopenia   . Renal calculus   . Snoring 02/20/2016    Past Surgical History:  Procedure Laterality Date  . BI-VENTRICULAR IMPLANTABLE CARDIOVERTER DEFIBRILLATOR UPGRADE N/A 02/04/2013   Procedure: BI-VENTRICULAR IMPLANTABLE CARDIOVERTER DEFIBRILLATOR UPGRADE;  Surgeon: Evans Lance, MD;  Location: [facility] CATH LAB;  Service: Cardiovascular;  Laterality: N/A;  . CARDIAC DEFIBRILLATOR PLACEMENT     AICD Replaced-5/09 and 2014  . LEFT AND RIGHT HEART CATHETERIZATION WITH CORONARY ANGIOGRAM N/A 10/06/2013   Procedure: LEFT AND RIGHT HEART CATHETERIZATION WITH CORONARY ANGIOGRAM;  Surgeon: Jolaine Artist, MD;  Location: [facility] CATH LAB;  Service: Cardiovascular;  Laterality: N/A;  . LITHOTRIPSY     h/o several procedures   . MASTECTOMY Bilateral   . OOPHORECTOMY Bilateral   . VENOGRAM N/A 10/20/2012   Procedure: VENOGRAM;  Surgeon: Deboraha Sprang, MD;  Location: [facility] CATH LAB;  Service: Cardiovascular;  Laterality: N/A;    Social History   Socioeconomic History  . Marital status: Married    Spouse name: Not  on file  . Number of children: 3  . Years of education: Not on file  . Highest education level: Not on file  Occupational History  . 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 Financial resource strain: Not on file  . Food insecurity:    Worry: Not on file    Inability: Not on file  . Transportation needs:    Medical: Not on file    Non-medical: Not on file  Tobacco Use  . Smoking status: Never Smoker  . Smokeless tobacco: Never Used  Substance and Sexual Activity  . Alcohol use: Yes    Comment: socially/occassionally  . Drug use: No  . Sexual activity: Not on file  Lifestyle  . Physical activity:    Days per week: Not on file    Minutes per session: Not on file  . Stress: Not on file  Relationships  . Social connections:    Talks on phone: Not on file    Gets together: Not on file    Attends religious service: Not on file    Active member of club or organization: Not on file    Attends meetings of clubs or organizations: Not on file    Relationship status: Not on file  . Intimate partner violence:    Fear  of current or ex partner: Not on file    Emotionally abused: Not on file    Physically abused: Not on file    Forced sexual activity: Not on file  Other Topics Concern  . Not on file  Social History Narrative   Related to Mr and Mrs Huston Foley, they are my patients as well    Married, 3 children all in [place], 7 Gkids     Siblings, 2 deceased, 2 living brothers      Family History  Problem Relation Age of Onset  . Kidney cancer Brother   . Breast cancer Mother        M and sister   . Lung cancer Father   . Breast cancer Sister   . Heart attack Neg Hx   . Diabetes Neg Hx   . Colon cancer Neg Hx      Allergies as of 09/23/2018      Reactions   Atacand [candesartan] Other (See Comments)   Causes fatigue   Cephalexin Hives, Itching   Latex Rash      Medication List        Accurate as of  09/23/18 11:59 PM. Always use your most recent med list.          ascorbic acid 1000 MG tablet Commonly known as:  VITAMIN C Take 1,000 mg by mouth daily.   atorvastatin 40 MG tablet Commonly known as:  LIPITOR Take 1 tablet (40 mg total) by mouth daily.   Biotin 5000 MCG Caps Take 5,000 mcg by mouth daily.   carvedilol 12.5 MG tablet Commonly known as:  COREG Take 6.25-12.5 mg by mouth. Take 6.25 mg (1/2 tablet) every am, and 12.5 mg (1 tablet) every pm.   clopidogrel 75 MG tablet Commonly known as:  PLAVIX Take 1 tablet (75 mg total) by mouth daily.   digoxin 0.125 MG tablet Commonly known as:  LANOXIN Take 0.5 tablets (0.0625 mg total) by mouth daily.   furosemide 40 MG tablet Commonly known as:  LASIX TAKE 2 TABLETS BY MOUTH  EVERY MORNING AND 1 TABLET  BY MOUTH EVERY EVENING   gabapentin 300 MG capsule Commonly known as:  NEURONTIN Take 300 mg by mouth daily as needed (for shingles flares).   multivitamin per tablet Take 1 tablet by mouth daily.   pantoprazole 40 MG tablet Commonly known as:  PROTONIX Take 1 tablet (40 mg total) by mouth daily.   potassium chloride 10 MEQ tablet Commonly known as:  K-DUR,KLOR-CON TAKE 3 TABLETS BY MOUTH  DAILY   PROBIOTIC DAILY PO Take 1 capsule by mouth daily.   sacubitril-valsartan 24-26 MG Commonly known as:  ENTRESTO Take 1 tablet by mouth 2 (two) times daily.   sertraline 25 MG tablet Commonly known as:  ZOLOFT Take 1 tablet (25 mg total) by mouth daily.   spironolactone 25 MG tablet Commonly known as:  ALDACTONE Take 1 tablet (25 mg total) by mouth daily.   vitamin B-12 1000 MCG tablet Commonly known as:  CYANOCOBALAMIN Take 1,000 mcg by mouth daily.   zolpidem 10 MG tablet Commonly known as:  AMBIEN TAKE ONE TABLET BY MOUTH AT BEDTIME AS NEEDED FOR SLEEP   Zoster Vaccine Adjuvanted injection Commonly known as:  SHINGRIX Inject 0.5 mLs into the muscle once for 1 dose.          Objective:    Physical Exam BP 122/80 (BP Location: Left Arm, Patient Position: Sitting, Cuff Size: Normal)   Pulse 76  Temp 98 F (36.7 C) (Oral)   Resp 16   Ht 5\' 4"  (1.626 m)   Wt 182 lb (82.6 kg)   SpO2 98%   BMI 31.24 kg/m  General: Well developed, NAD, see BMI.  Neck: No  thyromegaly  HEENT:  Normocephalic . Face symmetric, atraumatic Lungs:  CTA B Normal respiratory effort, no intercostal retractions, no accessory muscle use. Heart: RRR,  + syst murmur.  No pretibial edema bilaterally  Abdomen:  Not distended, soft, non-tender. No rebound or rigidity.   Skin: Exposed areas without rash. Not pale. Not jaundice Neurologic:  alert & oriented X3.  Speech normal, gait appropriate for age and unassisted Strength symmetric and appropriate for age.  Psych: Cognition and judgment appear intact.  Cooperative with normal attention span and concentration.  Behavior appropriate. No anxious or depressed appearing.     Assessment & Plan:   Assessment Hyperglycemia A1c 5.9 (04/2017) CV:Dr. Caryl Comes and Bensimohn --CHF, nonischemic cardiomyopathy:due to chemotherapy --MR severe --VT -TIA 05-2017 Depression Mild OSA per sleep study 11-2015, saw Dr Radford Pax 02-20-16: no need for CPAP Osteopenia-- DEXA 06/13/2015 normal Vitamin D deficiency Kidney stones Menopause Breast cancer x 2: first in 1982 with chest wall involvement, 2nd on the R dx  1987, s/p B mastectomy; + BRACA Handicap sticker signed [phone] Post herpetic neuralgia (shingles 2014) , R face-- gaba prn  PLAN: Hyperglycemia: Check A1c CHF: Closely follow-up by cardiology, check a digoxin level.  No symptoms at this time Depression: Well-controlled on Zoloft Insomnia: Takes Ambien sporadically GERD: At some point have acid reflux and some difficulty swallowing, she is completely asymptomatic, likes to wean off Protonix, recommend to the gradually over the next few months.  Okay to go back on it and let me know if needed RTC 6 to 8  months

## 2018-09-23 NOTE — Assessment & Plan Note (Addendum)
-  Td 2013;  pnm 23:  2017; prevnar:2015;   zostavax 2013; shingrix rx printed ; flu shot today - Cscope 2003, and 11-2011, next 10 years (Dr Deatra Ina) -Female care: (-) 10-2017, PAP. No further cervical ca screening  MMG-- h/o mastectomy , self chest examination normal per patient DEXA 2016, next 2021 -Labs cbc,a1c,tsh, dig level -She remains active and is trying to eat healthy

## 2018-09-24 LAB — DIGOXIN LEVEL: Digoxin Level: 0.6 mcg/L — ABNORMAL LOW (ref 0.8–2.0)

## 2018-09-26 NOTE — Assessment & Plan Note (Signed)
  Hyperglycemia: Check A1c CHF: Closely follow-up by cardiology, check a digoxin level.  No symptoms at this time Depression: Well-controlled on Zoloft Insomnia: Takes Ambien sporadically GERD: At some point have acid reflux and some difficulty swallowing, she is completely asymptomatic, likes to wean off Protonix, recommend to the gradually over the next few months.  Okay to go back on it and let me know if needed RTC 6 to 8 months

## 2018-10-11 ENCOUNTER — Ambulatory Visit (INDEPENDENT_AMBULATORY_CARE_PROVIDER_SITE_OTHER): Payer: Medicare Other

## 2018-10-11 DIAGNOSIS — I428 Other cardiomyopathies: Secondary | ICD-10-CM

## 2018-10-11 NOTE — Progress Notes (Signed)
Remote ICD transmission.   

## 2018-10-14 ENCOUNTER — Encounter: Payer: Self-pay | Admitting: Cardiology

## 2018-10-25 ENCOUNTER — Ambulatory Visit (HOSPITAL_COMMUNITY)
Admission: RE | Admit: 2018-10-25 | Discharge: 2018-10-25 | Disposition: A | Discharge status: Home / Self Care | Payer: Medicare Other | Source: Ambulatory Visit | Attending: Internal Medicine | Admitting: Internal Medicine

## 2018-10-25 DIAGNOSIS — G4733 Obstructive sleep apnea (adult) (pediatric): Secondary | ICD-10-CM | POA: Insufficient documentation

## 2018-10-25 DIAGNOSIS — I5022 Chronic systolic (congestive) heart failure: Secondary | ICD-10-CM | POA: Diagnosis not present

## 2018-10-25 DIAGNOSIS — I428 Other cardiomyopathies: Secondary | ICD-10-CM | POA: Diagnosis not present

## 2018-10-25 DIAGNOSIS — I340 Nonrheumatic mitral (valve) insufficiency: Secondary | ICD-10-CM | POA: Insufficient documentation

## 2018-10-25 NOTE — Progress Notes (Signed)
  Echocardiogram 2D Echocardiogram has been performed.  [name] [name] 10/25/2018, 12:11 PM

## 2018-10-28 ENCOUNTER — Telehealth: Payer: Self-pay

## 2018-10-28 NOTE — Telephone Encounter (Signed)
Called pt to advise that she would need an appt in order to get . Pt stated that she has had a productive cough for about a week and a half. I informed pt that, unfortunately, there was no availability with any of our providers until next week. Patient decided that she would go to an urgent care, since she did not want to wait through the weekend with the cough.

## 2018-10-28 NOTE — Telephone Encounter (Signed)
Copied from [place] [id number]. Topic: General - Other >> Oct 28, 2018  1:17 PM Janace Aris A wrote: Reason for CRM: pt called in wanting to know if Dr.Paz can send her over the rx HYDROcodone-homatropine (HYCODAN) 5-1.5 MG/5ML syrup, she says she is experiencing a very bad cough which she says she was already seen for previously.

## 2018-10-28 NOTE — Telephone Encounter (Signed)
Last OV 09/23/2018- reviewed note- nothing noted about cough. Will need OV.

## 2018-11-01 ENCOUNTER — Other Ambulatory Visit: Payer: Self-pay | Admitting: Internal Medicine

## 2018-11-02 NOTE — Telephone Encounter (Signed)
This is a CHF pt 

## 2018-11-03 DIAGNOSIS — Z85828 Personal history of other malignant neoplasm of skin: Secondary | ICD-10-CM | POA: Diagnosis not present

## 2018-11-03 DIAGNOSIS — L821 Other seborrheic keratosis: Secondary | ICD-10-CM | POA: Diagnosis not present

## 2018-11-03 DIAGNOSIS — D485 Neoplasm of uncertain behavior of skin: Secondary | ICD-10-CM | POA: Diagnosis not present

## 2018-11-03 DIAGNOSIS — D225 Melanocytic nevi of trunk: Secondary | ICD-10-CM | POA: Diagnosis not present

## 2018-11-03 DIAGNOSIS — L570 Actinic keratosis: Secondary | ICD-10-CM | POA: Diagnosis not present

## 2018-11-03 DIAGNOSIS — C44519 Basal cell carcinoma of skin of other part of trunk: Secondary | ICD-10-CM | POA: Diagnosis not present

## 2018-11-03 DIAGNOSIS — D1801 Hemangioma of skin and subcutaneous tissue: Secondary | ICD-10-CM | POA: Diagnosis not present

## 2018-11-05 MED ORDER — CARVEDILOL 6.25 MG PO TABS: 6.2500 mg | ORAL_TABLET | Freq: Two times a day (BID) | ORAL | 3 refills | Status: DC

## 2018-11-14 ENCOUNTER — Other Ambulatory Visit (HOSPITAL_COMMUNITY): Payer: Self-pay | Admitting: Internal Medicine

## 2018-11-19 ENCOUNTER — Telehealth (HOSPITAL_COMMUNITY): Payer: Self-pay

## 2018-11-19 ENCOUNTER — Other Ambulatory Visit: Payer: Self-pay | Admitting: Internal Medicine

## 2018-11-19 NOTE — Telephone Encounter (Signed)
Medication Samples have been provided to the patient.  Drug name: [name]       Strength: 24-26 mg        Qty: 2  LOT: [id number]  Exp.Date: 12/21  Dosing instructions: TAKE ONE TABLET BY MOUTH TWICE DAILY   The patient has been instructed regarding the correct time, dose, and frequency of taking this medication, including desired effects and most common side effects.   [name] 12:19 PM 11/19/2018

## 2018-12-02 DIAGNOSIS — L905 Scar conditions and fibrosis of skin: Secondary | ICD-10-CM | POA: Diagnosis not present

## 2018-12-02 DIAGNOSIS — C44519 Basal cell carcinoma of skin of other part of trunk: Secondary | ICD-10-CM | POA: Diagnosis not present

## 2018-12-07 LAB — CUP PACEART REMOTE DEVICE CHECK
Date Time Interrogation Session: 20191118051200
HighPow Impedance: 58 Ohm
Implantable Lead Implant Date: 20050701
Implantable Lead Implant Date: 20050701
Implantable Lead Location: 753860
Implantable Lead Model: 4196
Implantable Lead Serial Number: 156416
Implantable Pulse Generator Implant Date: 20140314
Lead Channel Impedance Value: 458 Ohm
Lead Channel Impedance Value: 465 Ohm
Lead Channel Impedance Value: 790 Ohm
Lead Channel Pacing Threshold Amplitude: 1.2 V
Lead Channel Pacing Threshold Pulse Width: 0.4 ms
Lead Channel Pacing Threshold Pulse Width: 0.6 ms
Lead Channel Setting Pacing Amplitude: 2.4 V
Lead Channel Setting Pacing Pulse Width: 0.6 ms
Lead Channel Setting Pacing Pulse Width: 0.6 ms
MDC IDC LEAD IMPLANT DT: 20140314
MDC IDC LEAD LOCATION: 753858
MDC IDC LEAD LOCATION: 753859
MDC IDC MSMT BATTERY REMAINING LONGEVITY: 48 mo
MDC IDC MSMT BATTERY REMAINING PERCENTAGE: 76 %
MDC IDC MSMT LEADCHNL LV PACING THRESHOLD AMPLITUDE: 1 V
MDC IDC MSMT LEADCHNL LV PACING THRESHOLD PULSEWIDTH: 0.6 ms
MDC IDC MSMT LEADCHNL RA PACING THRESHOLD AMPLITUDE: 0.7 V
MDC IDC PG SERIAL: 111235
MDC IDC SET LEADCHNL LV PACING AMPLITUDE: 2 V
MDC IDC SET LEADCHNL LV SENSING SENSITIVITY: 1 mV
MDC IDC SET LEADCHNL RA PACING AMPLITUDE: 2 V
MDC IDC SET LEADCHNL RV SENSING SENSITIVITY: 0.5 mV
MDC IDC STAT BRADY RA PERCENT PACED: 0 %
MDC IDC STAT BRADY RV PERCENT PACED: 100 %

## 2018-12-15 ENCOUNTER — Telehealth: Payer: Self-pay | Admitting: Internal Medicine

## 2018-12-16 NOTE — Telephone Encounter (Signed)
Sent!

## 2018-12-16 NOTE — Telephone Encounter (Signed)
Pt is requesting refill on Ambien.   Last OV: 09/23/2018 Last Fill: 09/13/2018 #90 and 0RF UDS: None  NCCR printed- no discrepancies noted- sent for scanning

## 2018-12-17 ENCOUNTER — Other Ambulatory Visit: Payer: Self-pay | Admitting: Internal Medicine

## 2018-12-17 ENCOUNTER — Other Ambulatory Visit (HOSPITAL_COMMUNITY): Payer: Self-pay | Admitting: Internal Medicine

## 2018-12-23 ENCOUNTER — Other Ambulatory Visit (HOSPITAL_COMMUNITY): Payer: Self-pay

## 2018-12-23 MED ORDER — DIGOXIN 125 MCG PO TABS: 0.0625 mg | ORAL_TABLET | Freq: Every day | ORAL | 3 refills | Status: DC

## 2018-12-23 MED ORDER — CLOPIDOGREL BISULFATE 75 MG PO TABS: 75.0000 mg | ORAL_TABLET | Freq: Every day | ORAL | 3 refills | Status: DC

## 2018-12-24 ENCOUNTER — Other Ambulatory Visit (HOSPITAL_COMMUNITY): Payer: Self-pay

## 2018-12-29 ENCOUNTER — Other Ambulatory Visit (HOSPITAL_COMMUNITY): Payer: Self-pay

## 2018-12-29 MED ORDER — DIGOXIN 125 MCG PO TABS: 0.0625 mg | ORAL_TABLET | Freq: Every day | ORAL | 3 refills | Status: DC

## 2019-01-10 ENCOUNTER — Ambulatory Visit (INDEPENDENT_AMBULATORY_CARE_PROVIDER_SITE_OTHER): Payer: Medicare Other

## 2019-01-10 DIAGNOSIS — I428 Other cardiomyopathies: Secondary | ICD-10-CM | POA: Diagnosis not present

## 2019-01-11 DIAGNOSIS — H40013 Open angle with borderline findings, low risk, bilateral: Secondary | ICD-10-CM | POA: Diagnosis not present

## 2019-01-11 LAB — CUP PACEART REMOTE DEVICE CHECK
Battery Remaining Longevity: 48 mo
Battery Remaining Percentage: 72 %
Brady Statistic RA Percent Paced: 0 %
Date Time Interrogation Session: 20200217051100
HighPow Impedance: 60 Ohm
Implantable Lead Implant Date: 20050701
Implantable Lead Implant Date: 20050701
Implantable Lead Implant Date: 20140314
Implantable Lead Location: 753858
Implantable Lead Location: 753859
Implantable Lead Location: 753860
Implantable Lead Model: 158
Implantable Lead Model: 4196
Implantable Lead Model: 5076
Implantable Lead Serial Number: 156416
Implantable Pulse Generator Implant Date: 20140314
Lead Channel Impedance Value: 460 Ohm
Lead Channel Impedance Value: 533 Ohm
Lead Channel Pacing Threshold Amplitude: 0.7 V
Lead Channel Pacing Threshold Amplitude: 1 V
Lead Channel Pacing Threshold Amplitude: 1.2 V
Lead Channel Pacing Threshold Pulse Width: 0.4 ms
Lead Channel Pacing Threshold Pulse Width: 0.6 ms
Lead Channel Pacing Threshold Pulse Width: 0.6 ms
Lead Channel Setting Pacing Amplitude: 2 V
Lead Channel Setting Pacing Amplitude: 2 V
Lead Channel Setting Pacing Amplitude: 2.4 V
Lead Channel Setting Pacing Pulse Width: 0.6 ms
Lead Channel Setting Pacing Pulse Width: 0.6 ms
Lead Channel Setting Sensing Sensitivity: 0.5 mV
Lead Channel Setting Sensing Sensitivity: 1 mV
MDC IDC MSMT LEADCHNL LV IMPEDANCE VALUE: 877 Ohm
MDC IDC STAT BRADY RV PERCENT PACED: 100 %
Pulse Gen Serial Number: 111235

## 2019-01-19 NOTE — Progress Notes (Signed)
Remote ICD transmission.   

## 2019-01-20 ENCOUNTER — Other Ambulatory Visit (HOSPITAL_COMMUNITY): Payer: Self-pay | Admitting: Internal Medicine

## 2019-01-27 DIAGNOSIS — M65341 Trigger finger, right ring finger: Secondary | ICD-10-CM | POA: Diagnosis not present

## 2019-01-27 DIAGNOSIS — M7061 Trochanteric bursitis, right hip: Secondary | ICD-10-CM | POA: Diagnosis not present

## 2019-01-27 DIAGNOSIS — M79641 Pain in right hand: Secondary | ICD-10-CM | POA: Diagnosis not present

## 2019-02-03 ENCOUNTER — Encounter: Payer: Self-pay | Admitting: Internal Medicine

## 2019-02-07 ENCOUNTER — Other Ambulatory Visit (HOSPITAL_COMMUNITY): Payer: Self-pay | Admitting: Internal Medicine

## 2019-02-07 ENCOUNTER — Telehealth: Payer: Self-pay | Admitting: *Deleted

## 2019-02-07 NOTE — Telephone Encounter (Signed)
Spoke with patient regarding 6 VT-1 zone episodes on 01/31/19, EGMs suggest AT/ST, treated inappropriately with ATP. Pt reports some dizziness that day, no other symptoms.  Discussed with Dr. Caryl Comes-- plan to move up pt's f/u appt from 02/14/19 to 02/10/19. Pt is agreeable to this plan, appt scheduled for 2:15pm.

## 2019-02-08 ENCOUNTER — Telehealth (HOSPITAL_COMMUNITY): Payer: Self-pay | Admitting: *Deleted

## 2019-02-08 NOTE — Telephone Encounter (Signed)
Received form from [facility], pt needs clearance for surgery on R ring finger and clearance to d/c plavix prior.  Per Dr Haroldine Laws: "Low risk, ok to hold Plavix for 5 days prior and resume 1-2 days after"  Note faxed back to them [phone]

## 2019-02-10 ENCOUNTER — Ambulatory Visit: Payer: Medicare Other | Admitting: Internal Medicine

## 2019-02-10 ENCOUNTER — Other Ambulatory Visit: Payer: Self-pay

## 2019-02-10 ENCOUNTER — Encounter: Payer: Self-pay | Admitting: Internal Medicine

## 2019-02-10 VITALS — BP 126/70 | HR 73 | Ht 64.0 in | Wt 183.6 lb

## 2019-02-10 DIAGNOSIS — I428 Other cardiomyopathies: Secondary | ICD-10-CM | POA: Diagnosis not present

## 2019-02-10 DIAGNOSIS — Z95810 Presence of automatic (implantable) cardiac defibrillator: Secondary | ICD-10-CM | POA: Diagnosis not present

## 2019-02-10 DIAGNOSIS — I5022 Chronic systolic (congestive) heart failure: Secondary | ICD-10-CM | POA: Diagnosis not present

## 2019-02-10 DIAGNOSIS — Z8673 Personal history of transient ischemic attack (TIA), and cerebral infarction without residual deficits: Secondary | ICD-10-CM | POA: Diagnosis not present

## 2019-02-10 NOTE — Progress Notes (Signed)
After watching   seen    Patient Care Team: Colon Branch, MD as PCP - General Bensimhon, Shaune Pascal, MD as Consulting Physician (Cardiology) Hortencia Pilar, MD as Consulting Physician (Ophthalmology)   HPI  [name] is a 68 y.o. female seen in followup for congestive heart failure in the setting of chemotherapy associated cardiomyopathy with previously implanted CRT. She did part of the MADIT CRT protocol; her LV lead had failed and was capped.  When she began to develop worsening symptoms of heart failure in the fall and in the winter 2014  she underwent CRT upgrade.   She has been followed by Dr. Wynonia Lawman and the heart failure clinic.  She has a history of a TIA.  9/18 aspirin was decreased to 81 and Plavix was added.  Heart failure notes from 12/18  were reviewed.  The patient denies chest pain, shortness of breath, nocturnal dyspnea, orthopnea or peripheral edema.  There have been no palpitations, lightheadedness or syncope.     DATE TEST EF   11/14 Echo   10-15 %   7/15 Echo   10-15 % Severe RV dysfn  9/17 Echo  20-25% AI mod MR severe  6/18 Echo  20-25% MR mild AI mod  12/19 Echo  20/25% MR severe  eccentric           Date Cr K Dig  3/18  0.91 4.4 0.7  12/18 0.96 4.2 0.3 (6/18)   Date LDL  8/17 118         Past Medical History:  Diagnosis Date  . Breast CA ([place])    twice first on L breast 1982 w/ chest wall involvment, then a second breast ca on the R in 1987  . Chronic systolic CHF (congestive heart failure) ([place])    a. due to Adriamycin,s/p ICD;  b. 01/2013 Gen change and new LV lead - BSX Energen CRT-D BiV ICD, Ser # [id number]  . Depression   . Diverticulitis   . Early menopause    early 70s  . Fatigue 02/20/2016  . Glaucoma suspect of both eyes    Dr. Marshall Cork  . Nonischemic cardiomyopathy ([place])   . OSA (obstructive sleep apnea) 02/07/2016  . Osteopenia   . Renal calculus   . Snoring 02/20/2016    Past Surgical History:   Procedure Laterality Date  . BI-VENTRICULAR IMPLANTABLE CARDIOVERTER DEFIBRILLATOR UPGRADE N/A 02/04/2013   Procedure: BI-VENTRICULAR IMPLANTABLE CARDIOVERTER DEFIBRILLATOR UPGRADE;  Surgeon: Evans Lance, MD;  Location: [facility] CATH LAB;  Service: Cardiovascular;  Laterality: N/A;  . CARDIAC DEFIBRILLATOR PLACEMENT     AICD Replaced-5/09 and 2014  . LEFT AND RIGHT HEART CATHETERIZATION WITH CORONARY ANGIOGRAM N/A 10/06/2013   Procedure: LEFT AND RIGHT HEART CATHETERIZATION WITH CORONARY ANGIOGRAM;  Surgeon: Jolaine Artist, MD;  Location: [facility] CATH LAB;  Service: Cardiovascular;  Laterality: N/A;  . LITHOTRIPSY     h/o several procedures   . MASTECTOMY Bilateral   . OOPHORECTOMY Bilateral   . VENOGRAM N/A 10/20/2012   Procedure: VENOGRAM;  Surgeon: Deboraha Sprang, MD;  Location: [facility] CATH LAB;  Service: Cardiovascular;  Laterality: N/A;    Current Outpatient Medications  Medication Sig Dispense Refill  . ascorbic acid (VITAMIN C) 1000 MG tablet Take 1,000 mg by mouth daily.    Marland Kitchen atorvastatin (LIPITOR) 40 MG tablet Take 1 tablet (40 mg total) by mouth daily. 90 tablet 3  . Biotin 5000 MCG CAPS Take 5,000 mcg by mouth daily.     Marland Kitchen  carvedilol (COREG) 12.5 MG tablet Take 6.25-12.5 mg by mouth 2 (two) times daily with a meal. Patient takes 1/2 tablet in the morning and 1 tablet at night.    . clopidogrel (PLAVIX) 75 MG tablet Take 1 tablet (75 mg total) by mouth daily. 90 tablet 3  . digoxin (LANOXIN) 0.125 MG tablet TAKE 1 TABLET BY MOUTH  DAILY 90 tablet 3  . ENTRESTO 24-26 MG TAKE ONE TABLET BY MOUTH TWICE DAILY  60 tablet 4  . furosemide (LASIX) 40 MG tablet TAKE 2 TABLETS BY MOUTH  EVERY MORNING AND 1 TABLET  BY MOUTH EVERY EVENING 270 tablet 3  . gabapentin (NEURONTIN) 300 MG capsule Take 300 mg by mouth daily as needed (for shingles flares).     . multivitamin (THERAGRAN) per tablet Take 1 tablet by mouth daily.     . pantoprazole (PROTONIX) 40 MG tablet Take 1 tablet (40 mg total) by mouth  daily. 90 tablet 3  . potassium chloride (K-DUR,KLOR-CON) 10 MEQ tablet TAKE 3 TABLETS BY MOUTH  DAILY 270 tablet 1  . Probiotic Product (PROBIOTIC DAILY PO) Take 1 capsule by mouth daily.     . sertraline (ZOLOFT) 25 MG tablet TAKE 1 TABLET BY MOUTH  DAILY 90 tablet 1  . spironolactone (ALDACTONE) 25 MG tablet Take 1 tablet (25 mg total) by mouth daily. 90 tablet 3  . vitamin B-12 (CYANOCOBALAMIN) 1000 MCG tablet Take 1,000 mcg by mouth daily.    Marland Kitchen zolpidem (AMBIEN) 10 MG tablet TAKE ONE TABLET BY MOUTH AT BEDTIME as needed for sleep 90 tablet 1   No current facility-administered medications for this visit.     Allergies  Allergen Reactions  . Atacand [Candesartan] Other (See Comments)    Causes fatigue   . Cephalexin Hives and Itching  . Latex Rash    Review of Systems negative except from HPI and PMH  Physical Exam BP 126/70   Pulse 73   Ht 5\' 4"  (1.626 m)   Wt 183 lb 9.6 oz (83.3 kg)   SpO2 97%   BMI 31.51 kg/m  Well developed and nourished in no acute distress HENT normal Neck supple with JVP-flat Clear Regular rate and rhythm, 2/6 systolic  Abd-soft with active BS No Clubbing cyanosis edema Skin-warm and dry A & Oriented  Grossly normal sensory and motor function   ECG demonstrates P-synchronous/ Biv  pacing   Assessment and  Plan  Nonischemic cardiomyopathy/MR-severe  Congestive heart failure chronic systolic  Ventricular tachycardia-nonsustained  Hyperlipidemia  TIA  Lightheadedness/ presyncope  Implantable defibrillator-[employer]-CRT  .The patient's device was interrogated.  The information was reviewed. No changes were made in the programming.     She had rapid tachycardia that appeared to be SVT.  It exceeded her VT zone and resulted in inappropriate ATP.  Her device was reprogrammed to reset her VT zone as well as to reprogram her ATP therapies from 8 bursts of 4-4 bursts of 8.  Surprisingly she is well compensated from her heart  failure point of view even with her mitral regurgitation.  We spent more than 50% of our >25 min visit in face to face counseling regarding the above

## 2019-02-10 NOTE — Patient Instructions (Addendum)
Medication Instructions: Your physician recommends that you continue on your current medications as directed. Please refer to the Current Medication list given to you today.   Labwork: None ordered today  Procedures/Testing: None ordered today  Follow-Up: Your physician recommends that you schedule a follow-up appointment in:   1 year follow up with Dr. Caryl Comes   Any Additional Special Instructions Will Be Listed Below (If Applicable).     If you need a refill on your cardiac medications before your next appointment, please call your pharmacy.

## 2019-02-14 ENCOUNTER — Encounter: Payer: Medicare Other | Admitting: Internal Medicine

## 2019-02-23 HISTORY — PX: OTHER SURGICAL HISTORY: SHX169

## 2019-03-14 DIAGNOSIS — M65341 Trigger finger, right ring finger: Secondary | ICD-10-CM | POA: Diagnosis not present

## 2019-03-25 ENCOUNTER — Telehealth (HOSPITAL_COMMUNITY): Payer: Self-pay | Admitting: Licensed Clinical Social Worker

## 2019-03-25 NOTE — Telephone Encounter (Signed)
CSW consulted to speak with pt regarding medication concerns.  Pt has been getting Entresto through her insurance with copay assistance through the [employer].  Her PAN grant expires 05/08/19 and she currently has $133 remaining.  The patient was inquiring what she needs to do to reenroll in the PAN foundation.  CSW explained that the PAN foundation is not currently open and we are unaware of when it will open again so we cannot guarantee that she can continue getting assistance through them.  CSW discussed [employer] patient assistance foundation as an alternative to the [employer].  Patient agreeable to applying if PAN isn't open in time for her renewal- CSW requested clinic mail an application and instructed pt to return application to the clinic when she has finished.  CSW will continue to follow and assist as needed  Jorge Ny, [facility] Desk#: [phone] Cell#: [phone]

## 2019-03-28 ENCOUNTER — Ambulatory Visit: Payer: Medicare Other | Admitting: Internal Medicine

## 2019-03-28 ENCOUNTER — Ambulatory Visit: Payer: Medicare Other | Admitting: *Deleted

## 2019-03-28 DIAGNOSIS — M25641 Stiffness of right hand, not elsewhere classified: Secondary | ICD-10-CM | POA: Diagnosis not present

## 2019-03-28 NOTE — Progress Notes (Deleted)
Virtual Visit via Video Note  I connected with patient on 03/29/19 at  9:00 AM EDT by a video enabled telemedicine application and verified that I am speaking with the correct person using two identifiers.   THIS ENCOUNTER IS A VIRTUAL VISIT DUE TO COVID-19 - PATIENT WAS NOT SEEN IN THE OFFICE. PATIENT HAS CONSENTED TO VIRTUAL VISIT / TELEMEDICINE VISIT   Location of patient: home  Location of provider: office  I discussed the limitations of evaluation and management by telemedicine and the availability of in person appointments. The patient expressed understanding and agreed to proceed.   Subjective:   [name] is a 68 y.o. female who presents for Medicare Annual (Subsequent) preventive examination.  Review of Systems: No ROS.  Medicare Wellness Virtual Visit. UTA vital signs.  Additional risk factors are reflected in the social history.   Sleep patterns:  Home Safety/Smoke Alarms: Feels safe in home. Smoke alarms in place.   Female:   Pap- 11/05/17      Mammo-        Dexa scan-        CCS- 12/25/11. 10 yr recall      Objective:     Vitals: Unable to assess. This visit is enabled though telemedicine due to Covid 19.   Advanced Directives 11/20/2015 10/06/2013 02/04/2013 10/20/2012  Does Patient Have a Medical Advance Directive? No Patient does not have advance directive Patient does not have advance directive Patient does not have advance directive  Would patient like information on creating a medical advance directive? Yes - Educational materials given - - -  Pre-existing out of facility DNR order (yellow form or pink MOST form) - No No No    Tobacco Social History   Tobacco Use  Smoking Status Never Smoker  Smokeless Tobacco Never Used     Counseling given: Not Answered   Clinical Intake:                       Past Medical History:  Diagnosis Date   Breast CA ([place])    twice first on L breast 1982 w/ chest wall involvment, then a  second breast ca on the R in 6967   Chronic systolic CHF (congestive heart failure) ([place])    a. due to Adriamycin,s/p ICD;  b. 01/2013 Gen change and new LV lead - BSX Energen CRT-D BiV ICD, Ser # [id number]   Depression    Diverticulitis    Early menopause    early 30s   Fatigue 02/20/2016   Glaucoma suspect of both eyes    Dr. Marshall Cork   Nonischemic cardiomyopathy [facility])    OSA (obstructive sleep apnea) 02/07/2016   Osteopenia    Renal calculus    Snoring 02/20/2016   Past Surgical History:  Procedure Laterality Date   BI-VENTRICULAR IMPLANTABLE CARDIOVERTER DEFIBRILLATOR UPGRADE N/A 02/04/2013   Procedure: BI-VENTRICULAR IMPLANTABLE CARDIOVERTER DEFIBRILLATOR UPGRADE;  Surgeon: Evans Lance, MD;  Location: [facility] CATH LAB;  Service: Cardiovascular;  Laterality: N/A;   CARDIAC DEFIBRILLATOR PLACEMENT     AICD Replaced-5/09 and 2014   LEFT AND RIGHT HEART CATHETERIZATION WITH CORONARY ANGIOGRAM N/A 10/06/2013   Procedure: LEFT AND RIGHT HEART CATHETERIZATION WITH CORONARY ANGIOGRAM;  Surgeon: Jolaine Artist, MD;  Location: [facility] CATH LAB;  Service: Cardiovascular;  Laterality: N/A;   LITHOTRIPSY     h/o several procedures    MASTECTOMY Bilateral    OOPHORECTOMY Bilateral    VENOGRAM N/A 10/20/2012   Procedure:  VENOGRAM;  Surgeon: Deboraha Sprang, MD;  Location: [facility] CATH LAB;  Service: Cardiovascular;  Laterality: N/A;   Family History  Problem Relation Age of Onset   Kidney cancer Brother    Breast cancer Mother        M and sister    Lung cancer Father    Breast cancer Sister    Heart attack Neg Hx    Diabetes Neg Hx    Colon cancer Neg Hx    Social History   Socioeconomic History   Marital status: Married    Spouse name: Not on file   Number of children: 3   Years of education: Not on file   Highest education level: Not on file  Occupational History   Occupation: RETIRED---pre school Buyer, retail: Probation officer    Comment:  preschool  Social Designer, fashion/clothing strain: Not on file   Food insecurity:    Worry: Not on file    Inability: Not on file   Transportation needs:    Medical: Not on file    Non-medical: Not on file  Tobacco Use   Smoking status: Never Smoker   Smokeless tobacco: Never Used  Substance and Sexual Activity   Alcohol use: Yes    Comment: socially/occassionally   Drug use: No   Sexual activity: Not on file  Lifestyle   Physical activity:    Days per week: Not on file    Minutes per session: Not on file   Stress: Not on file  Relationships   Social connections:    Talks on phone: Not on file    Gets together: Not on file    Attends religious service: Not on file    Active member of club or organization: Not on file    Attends meetings of clubs or organizations: Not on file    Relationship status: Not on file  Other Topics Concern   Not on file  Social History Narrative   Related to Mr and Mrs Huston Foley, they are my patients as well    Married, 3 children all in [place], 7 Gkids     Siblings, 2 deceased, 2 living brothers     Outpatient Encounter Medications as of 03/29/2019  Medication Sig   ascorbic acid (VITAMIN C) 1000 MG tablet Take 1,000 mg by mouth daily.   atorvastatin (LIPITOR) 40 MG tablet Take 1 tablet (40 mg total) by mouth daily.   Biotin 5000 MCG CAPS Take 5,000 mcg by mouth daily.    carvedilol (COREG) 12.5 MG tablet Take 6.25-12.5 mg by mouth 2 (two) times daily with a meal. Patient takes 1/2 tablet in the morning and 1 tablet at night.   clopidogrel (PLAVIX) 75 MG tablet Take 1 tablet (75 mg total) by mouth daily.   digoxin (LANOXIN) 0.125 MG tablet TAKE 1 TABLET BY MOUTH  DAILY   ENTRESTO 24-26 MG TAKE ONE TABLET BY MOUTH TWICE DAILY    furosemide (LASIX) 40 MG tablet TAKE 2 TABLETS BY MOUTH  EVERY MORNING AND 1 TABLET  BY MOUTH EVERY EVENING   gabapentin (NEURONTIN) 300 MG capsule Take 300 mg by mouth daily as needed (for shingles  flares).    multivitamin (THERAGRAN) per tablet Take 1 tablet by mouth daily.    pantoprazole (PROTONIX) 40 MG tablet Take 1 tablet (40 mg total) by mouth daily.   potassium chloride (K-DUR,KLOR-CON) 10 MEQ tablet TAKE 3 TABLETS BY MOUTH  DAILY   Probiotic Product (  PROBIOTIC DAILY PO) Take 1 capsule by mouth daily.    sertraline (ZOLOFT) 25 MG tablet TAKE 1 TABLET BY MOUTH  DAILY   spironolactone (ALDACTONE) 25 MG tablet Take 1 tablet (25 mg total) by mouth daily.   vitamin B-12 (CYANOCOBALAMIN) 1000 MCG tablet Take 1,000 mcg by mouth daily.   zolpidem (AMBIEN) 10 MG tablet TAKE ONE TABLET BY MOUTH AT BEDTIME as needed for sleep   No facility-administered encounter medications on file as of 03/29/2019.     Activities of Daily Living In your present state of health, do you have any difficulty performing the following activities: 09/23/2018  Hearing? N  Vision? N  Difficulty concentrating or making decisions? N  Walking or climbing stairs? N  Dressing or bathing? N  Doing errands, shopping? N  Some recent data might be hidden    Patient Care Team: Colon Branch, MD as PCP - General Bensimhon, Shaune Pascal, MD as Consulting Physician (Cardiology) Hortencia Pilar, MD as Consulting Physician (Ophthalmology)    Assessment:   This is a routine wellness examination for Jeryl. Physical assessment deferred to PCP.  Exercise Activities and Dietary recommendations   Diet (meal preparation, eat out, water intake, caffeinated beverages, dairy products, fruits and vegetables): {Desc; diets:16563} Breakfast: Lunch:  Dinner:      Goals   None     Fall Risk Fall Risk  09/23/2018 08/05/2017 07/02/2016 02/06/2016 05/31/2015  Falls in the past year? No No No No No     Depression Screen PHQ 2/9 Scores 09/23/2018 08/05/2017 08/05/2017 07/02/2016  PHQ - 2 Score 0 0 0 0  PHQ- 9 Score 0 0 - -     Cognitive Function Ad8 score reviewed for issues:  Issues making decisions:  Less  interest in hobbies / activities:  Repeats questions, stories (family complaining):  Trouble using ordinary gadgets (microwave, computer, phone):  Forgets the month or year:   Mismanaging finances:   Remembering appts:  Daily problems with thinking and/or memory: Ad8 score is=            Immunization History  Administered Date(s) Administered   Influenza Split 11/06/2011, 08/17/2012   Influenza Whole 10/01/2007, 08/29/2008, 09/21/2009, 08/12/2010   Influenza, High Dose Seasonal PF 08/05/2017, 09/23/2018   Influenza,inj,Quad PF,6+ Mos 11/10/2013, 09/11/2014   Influenza-Unspecified 09/25/2015   Pneumococcal Conjugate-13 09/11/2014   Pneumococcal Polysaccharide-23 03/14/2002, 10/01/2007, 07/02/2016   Td 03/14/2002   Tdap 08/17/2012   Zoster 11/19/2012    Screening Tests Health Maintenance  Topic Date Due   INFLUENZA VACCINE  06/25/2019   COLONOSCOPY  12/24/2021   TETANUS/TDAP  08/17/2022   DEXA SCAN  Completed   Hepatitis C Screening  Completed   PNA vac Low Risk Adult  Completed      Plan:   ***   I have personally reviewed and noted the following in the patients chart:    Medical and social history  Use of alcohol, tobacco or illicit drugs   Current medications and supplements  Functional ability and status  Nutritional status  Physical activity  Advanced directives  List of other physicians  Hospitalizations, surgeries, and ER visits in previous 12 months  Vitals  Screenings to include cognitive, depression, and falls  Referrals and appointments  In addition, I have reviewed and discussed with patient certain preventive protocols, quality metrics, and best practice recommendations. A written personalized care plan for preventive services as well as general preventive health recommendations were provided to patient.     Naaman Plummer  Glenard Haring, RN  03/28/2019

## 2019-03-29 ENCOUNTER — Ambulatory Visit: Payer: Medicare Other | Admitting: *Deleted

## 2019-03-29 ENCOUNTER — Encounter: Payer: Self-pay | Admitting: Internal Medicine

## 2019-03-29 ENCOUNTER — Ambulatory Visit (INDEPENDENT_AMBULATORY_CARE_PROVIDER_SITE_OTHER): Payer: Medicare Other | Admitting: Internal Medicine

## 2019-03-29 ENCOUNTER — Other Ambulatory Visit: Payer: Self-pay

## 2019-03-29 DIAGNOSIS — E785 Hyperlipidemia, unspecified: Secondary | ICD-10-CM | POA: Diagnosis not present

## 2019-03-29 DIAGNOSIS — I5022 Chronic systolic (congestive) heart failure: Secondary | ICD-10-CM

## 2019-03-29 DIAGNOSIS — R739 Hyperglycemia, unspecified: Secondary | ICD-10-CM

## 2019-03-29 DIAGNOSIS — F32A Depression, unspecified: Secondary | ICD-10-CM

## 2019-03-29 DIAGNOSIS — F329 Major depressive disorder, single episode, unspecified: Secondary | ICD-10-CM

## 2019-03-29 NOTE — Progress Notes (Signed)
Subjective:    Patient ID: [name], female    DOB: [birth year], 68 y.o.   MRN: [id number]  DOS:  03/29/2019 Type of visit - description: Attempted  to make this a video visit, due to technical difficulties from the patient side it was not possible  thus we proceeded with a Virtual Visit via Telephone    I connected with@ on 03/30/19 at 10:40 AM EDT by telephone and verified that I am speaking with the correct person using two identifiers.  THIS ENCOUNTER IS A VIRTUAL VISIT DUE TO COVID-19 - PATIENT WAS NOT SEEN IN THE OFFICE. PATIENT HAS CONSENTED TO VIRTUAL VISIT / TELEMEDICINE VISIT   Location of patient: home  Location of provider: office  I discussed the limitations, risks, security and privacy concerns of performing an evaluation and management service by telephone and the availability of in person appointments. I also discussed with the patient that there may be a patient responsible charge related to this service. The patient expressed understanding and agreed to proceed.   History of Present Illness: Follow-up Since the last office visit she is doing well, following all the COVID precautions Depression, good med compliance, emotionally doing well. Had trigger finger surgery, good results and doing well High cholesterol: Due for FLP CHF, MR, arrhythmia: Note from Dr. Caryl Comes reviewed.  Stable We review it together her labs and medication list  Review of Systems Denies fever chills No cough No chest pain, difficulty breathing or edema No palpitations No nausea, vomiting, diarrhea  Past Medical History:  Diagnosis Date  . Breast CA ([place])    twice first on L breast 1982 w/ chest wall involvment, then a second breast ca on the R in 1987  . Chronic systolic CHF (congestive heart failure) ([place])    a. due to Adriamycin,s/p ICD;  b. 01/2013 Gen change and new LV lead - BSX Energen CRT-D BiV ICD, Ser # [id number]  . Depression   . Diverticulitis   . Early menopause    early 61s  . Fatigue 02/20/2016  . Glaucoma suspect of both eyes    Dr. Marshall Cork  . Nonischemic cardiomyopathy ([place])   . OSA (obstructive sleep apnea) 02/07/2016  . Osteopenia   . Renal calculus   . Snoring 02/20/2016    Past Surgical History:  Procedure Laterality Date  . BI-VENTRICULAR IMPLANTABLE CARDIOVERTER DEFIBRILLATOR UPGRADE N/A 02/04/2013   Procedure: BI-VENTRICULAR IMPLANTABLE CARDIOVERTER DEFIBRILLATOR UPGRADE;  Surgeon: Evans Lance, MD;  Location: [facility] CATH LAB;  Service: Cardiovascular;  Laterality: N/A;  . CARDIAC DEFIBRILLATOR PLACEMENT     AICD Replaced-5/09 and 2014  . LEFT AND RIGHT HEART CATHETERIZATION WITH CORONARY ANGIOGRAM N/A 10/06/2013   Procedure: LEFT AND RIGHT HEART CATHETERIZATION WITH CORONARY ANGIOGRAM;  Surgeon: Jolaine Artist, MD;  Location: [facility] CATH LAB;  Service: Cardiovascular;  Laterality: N/A;  . LITHOTRIPSY     h/o several procedures   . MASTECTOMY Bilateral   . OOPHORECTOMY Bilateral   . triger finger, R ring finger Right 02/2019  . VENOGRAM N/A 10/20/2012   Procedure: VENOGRAM;  Surgeon: Deboraha Sprang, MD;  Location: [facility] CATH LAB;  Service: Cardiovascular;  Laterality: N/A;    Social History   Socioeconomic History  . Marital status: Married    Spouse name: Not on file  . Number of children: 3  . Years of education: Not on file  . Highest education level: Not on file  Occupational History  . Occupation: RETIRED---pre Librarian, academic  Employer: [employer]    Comment: preschool  Social Needs  . Financial resource strain: Not on file  . Food insecurity:    Worry: Not on file    Inability: Not on file  . Transportation needs:    Medical: Not on file    Non-medical: Not on file  Tobacco Use  . Smoking status: Never Smoker  . Smokeless tobacco: Never Used  Substance and Sexual Activity  . Alcohol use: Yes    Comment: socially/occassionally  . Drug use: No  . Sexual activity: Not on file  Lifestyle  .  Physical activity:    Days per week: Not on file    Minutes per session: Not on file  . Stress: Not on file  Relationships  . Social connections:    Talks on phone: Not on file    Gets together: Not on file    Attends religious service: Not on file    Active member of club or organization: Not on file    Attends meetings of clubs or organizations: Not on file    Relationship status: Not on file  . Intimate partner violence:    Fear of current or ex partner: Not on file    Emotionally abused: Not on file    Physically abused: Not on file    Forced sexual activity: Not on file  Other Topics Concern  . Not on file  Social History Narrative   Related to Mr and Mrs Huston Foley, they are my patients as well    Married, 3 children all in [place], 7 Gkids     Siblings, 2 deceased, 2 living brothers       Allergies as of 03/29/2019      Reactions   Atacand [candesartan] Other (See Comments)   Causes fatigue   Cephalexin Hives, Itching   Latex Rash      Medication List       Accurate as of Mar 29, 2019 11:59 PM. If you have any questions, ask your nurse or doctor.        ascorbic acid 1000 MG tablet Commonly known as:  VITAMIN C Take 1,000 mg by mouth daily.   atorvastatin 40 MG tablet Commonly known as:  Lipitor Take 1 tablet (40 mg total) by mouth daily.   Biotin 5000 MCG Caps Take 5,000 mcg by mouth daily.   carvedilol 12.5 MG tablet Commonly known as:  COREG Take 6.25-12.5 mg by mouth 2 (two) times daily with a meal. Patient takes 1/2 tablet in the morning and 1 tablet at night.   clopidogrel 75 MG tablet Commonly known as:  PLAVIX Take 1 tablet (75 mg total) by mouth daily.   digoxin 0.125 MG tablet Commonly known as:  LANOXIN TAKE 1 TABLET BY MOUTH  DAILY   Entresto 24-26 MG Generic drug:  sacubitril-valsartan TAKE ONE TABLET BY MOUTH TWICE DAILY   furosemide 40 MG tablet Commonly known as:  LASIX TAKE 2 TABLETS BY MOUTH  EVERY MORNING AND 1 TABLET  BY MOUTH EVERY  EVENING   gabapentin 300 MG capsule Commonly known as:  NEURONTIN Take 300 mg by mouth daily as needed (for shingles flares).   multivitamin per tablet Take 1 tablet by mouth daily.   pantoprazole 40 MG tablet Commonly known as:  PROTONIX Take 1 tablet (40 mg total) by mouth daily.   potassium chloride 10 MEQ tablet Commonly known as:  K-DUR TAKE 3 TABLETS BY MOUTH  DAILY   PROBIOTIC DAILY PO Take  1 capsule by mouth daily.   sertraline 25 MG tablet Commonly known as:  ZOLOFT TAKE 1 TABLET BY MOUTH  DAILY   spironolactone 25 MG tablet Commonly known as:  ALDACTONE Take 1 tablet (25 mg total) by mouth daily.   vitamin B-12 1000 MCG tablet Commonly known as:  CYANOCOBALAMIN Take 1,000 mcg by mouth daily.   zolpidem 10 MG tablet Commonly known as:  AMBIEN TAKE ONE TABLET BY MOUTH AT BEDTIME as needed for sleep           Objective:   Physical Exam There were no vitals taken for this visit. This is a virtual video visit, alert oriented x3, no apparent distress    Assessment     Assessment Hyperglycemia A1c 5.9 (04/2017) CV:Dr. Caryl Comes and Bensimohn --CHF, nonischemic cardiomyopathy:due to chemotherapy --MR severe --VT -TIA 05-2017 Depression Mild OSA per sleep study 11-2015, saw Dr Radford Pax 02-20-16: no need for CPAP Osteopenia-- DEXA 06/13/2015 normal Vitamin D deficiency Kidney stones Menopause Breast cancer x 2: first in 1982 with chest wall involvement, 2nd on the R dx  1987, s/p B mastectomy; + BRACA Handicap sticker signed 06-2016 Post herpetic neuralgia (shingles 2014) , R face-- gaba prn  PLAN: Hyperglycemia: Highest A1c was 6.4 few months ago, recheck A1c Hyperlipidemia: On Lipitor, check a AST, ALT and FLP CHF, MR, arrhythmia: Last visit with Dr. Caryl Comes 01-2019, felt to be stable.  Will see Dr. Haroldine Laws in few months.  Taking multiple medications: Carvedilol, Plavix, Lanoxin, Entresto, Lasix, potassium, Aldactone. Will check a BMP. Last digoxin level  okay Depression: On Zoloft, RF as needed, symptoms controlled Plan: Labs this week, next visit-2020 CPX      I discussed the assessment and treatment plan with the patient. The patient was provided an opportunity to ask questions and all were answered. The patient agreed with the plan and demonstrated an understanding of the instructions.   The patient was advised to call back or seek an in-person evaluation if the symptoms worsen or if the condition fails to improve as anticipated.  I provided  25  minutes of non-face-to-face time during this encounter.  Kathlene November, MD

## 2019-03-30 NOTE — Assessment & Plan Note (Signed)
Hyperglycemia: Highest A1c was 6.4 few months ago, recheck A1c Hyperlipidemia: On Lipitor, check a AST, ALT and FLP CHF, MR, arrhythmia: Last visit with Dr. Caryl Comes 01-2019, felt to be stable.  Will see Dr. Haroldine Laws in few months.  Taking multiple medications: Carvedilol, Plavix, Lanoxin, Entresto, Lasix, potassium, Aldactone. Will check a BMP. Last digoxin level okay Depression: On Zoloft, RF as needed, symptoms controlled Plan: Labs this week, next visit-2020 CPX

## 2019-03-31 ENCOUNTER — Other Ambulatory Visit: Payer: Self-pay

## 2019-03-31 ENCOUNTER — Other Ambulatory Visit (INDEPENDENT_AMBULATORY_CARE_PROVIDER_SITE_OTHER): Payer: Medicare Other

## 2019-03-31 DIAGNOSIS — R739 Hyperglycemia, unspecified: Secondary | ICD-10-CM | POA: Diagnosis not present

## 2019-03-31 DIAGNOSIS — I5022 Chronic systolic (congestive) heart failure: Secondary | ICD-10-CM | POA: Diagnosis not present

## 2019-03-31 DIAGNOSIS — E785 Hyperlipidemia, unspecified: Secondary | ICD-10-CM

## 2019-03-31 LAB — ALT: ALT: 21 U/L (ref 0–35)

## 2019-03-31 LAB — BASIC METABOLIC PANEL
BUN: 16 mg/dL (ref 6–23)
CO2: 28 mEq/L (ref 19–32)
Calcium: 9.3 mg/dL (ref 8.4–10.5)
Chloride: 102 mEq/L (ref 96–112)
Creatinine, Ser: 0.92 mg/dL (ref 0.40–1.20)
GFR: 60.39 mL/min (ref >60.00)
Glucose, Bld: 113 mg/dL — ABNORMAL HIGH (ref 70–99)
Potassium: 4.1 mEq/L (ref 3.5–5.1)
Sodium: 138 mEq/L (ref 135–145)

## 2019-03-31 LAB — LIPID PANEL
Cholesterol: 124 mg/dL (ref 0–200)
HDL: 43.6 mg/dL (ref >39.00)
LDL Cholesterol: 60 mg/dL (ref 0–99)
NonHDL: 80.63
Total CHOL/HDL Ratio: 3
Triglycerides: 105 mg/dL (ref 0.0–149.0)
VLDL: 21 mg/dL (ref 0.0–40.0)

## 2019-03-31 LAB — HEMOGLOBIN A1C: Hgb A1c MFr Bld: 6.3 % (ref 4.6–6.5)

## 2019-03-31 LAB — AST: AST: 20 U/L (ref 0–37)

## 2019-04-04 ENCOUNTER — Encounter (HOSPITAL_COMMUNITY): Payer: Medicare Other | Admitting: Internal Medicine

## 2019-04-07 LAB — CUP PACEART INCLINIC DEVICE CHECK
Brady Statistic RA Percent Paced: 1 % — CL
Brady Statistic RV Percent Paced: 100 %
Date Time Interrogation Session: 20200319040000
HighPow Impedance: 32 Ohm
Implantable Lead Implant Date: 20050701
Implantable Lead Implant Date: 20050701
Implantable Lead Implant Date: 20140314
Implantable Lead Location: 753858
Implantable Lead Location: 753859
Implantable Lead Model: 158
Implantable Lead Model: 5076
Implantable Lead Serial Number: 156416
Implantable Pulse Generator Implant Date: 20140314
Lead Channel Impedance Value: 459 Ohm
Lead Channel Impedance Value: 515 Ohm
Lead Channel Impedance Value: 840 Ohm
Lead Channel Pacing Threshold Amplitude: 0.6 V
Lead Channel Pacing Threshold Amplitude: 0.7 V
Lead Channel Pacing Threshold Amplitude: 1.5 V
Lead Channel Pacing Threshold Pulse Width: 0.4 ms
Lead Channel Pacing Threshold Pulse Width: 0.6 ms
Lead Channel Sensing Intrinsic Amplitude: 24.7 mV
Lead Channel Sensing Intrinsic Amplitude: 25 mV
Lead Channel Sensing Intrinsic Amplitude: 3.8 mV
Lead Channel Setting Pacing Amplitude: 2 V
Lead Channel Setting Pacing Amplitude: 2.2 V
Lead Channel Setting Pacing Pulse Width: 0.6 ms
Lead Channel Setting Pacing Pulse Width: 1 ms
Lead Channel Setting Sensing Sensitivity: 0.5 mV
Lead Channel Setting Sensing Sensitivity: 1 mV
MDC IDC LEAD LOCATION: 753860
MDC IDC MSMT LEADCHNL LV PACING THRESHOLD PULSEWIDTH: 0.6 ms
MDC IDC SET LEADCHNL RV PACING AMPLITUDE: 2.4 V
Pulse Gen Serial Number: 111235

## 2019-04-10 ENCOUNTER — Other Ambulatory Visit: Payer: Self-pay | Admitting: Internal Medicine

## 2019-04-10 ENCOUNTER — Other Ambulatory Visit (HOSPITAL_COMMUNITY): Payer: Self-pay | Admitting: Cardiology

## 2019-04-11 ENCOUNTER — Other Ambulatory Visit: Payer: Self-pay

## 2019-04-11 ENCOUNTER — Ambulatory Visit (INDEPENDENT_AMBULATORY_CARE_PROVIDER_SITE_OTHER): Payer: Medicare Other | Admitting: *Deleted

## 2019-04-11 DIAGNOSIS — I428 Other cardiomyopathies: Secondary | ICD-10-CM | POA: Diagnosis not present

## 2019-04-12 LAB — CUP PACEART REMOTE DEVICE CHECK
Battery Remaining Longevity: 42 mo
Battery Remaining Percentage: 65 %
Brady Statistic RA Percent Paced: 0 %
Brady Statistic RV Percent Paced: 100 %
Date Time Interrogation Session: 20200518041200
HighPow Impedance: 56 Ohm
Implantable Lead Implant Date: 20050701
Implantable Lead Implant Date: 20050701
Implantable Lead Implant Date: 20140314
Implantable Lead Location: 753858
Implantable Lead Location: 753859
Implantable Lead Location: 753860
Implantable Lead Model: 158
Implantable Lead Model: 4196
Implantable Lead Model: 5076
Implantable Lead Serial Number: 156416
Implantable Pulse Generator Implant Date: 20140314
Lead Channel Impedance Value: 449 Ohm
Lead Channel Impedance Value: 501 Ohm
Lead Channel Impedance Value: 851 Ohm
Lead Channel Pacing Threshold Amplitude: 0.6 V
Lead Channel Pacing Threshold Amplitude: 0.7 V
Lead Channel Pacing Threshold Amplitude: 1.5 V
Lead Channel Pacing Threshold Pulse Width: 0.4 ms
Lead Channel Pacing Threshold Pulse Width: 0.6 ms
Lead Channel Pacing Threshold Pulse Width: 0.6 ms
Lead Channel Setting Pacing Amplitude: 2 V
Lead Channel Setting Pacing Amplitude: 2.2 V
Lead Channel Setting Pacing Amplitude: 2.4 V
Lead Channel Setting Pacing Pulse Width: 0.6 ms
Lead Channel Setting Pacing Pulse Width: 1 ms
Lead Channel Setting Sensing Sensitivity: 0.5 mV
Lead Channel Setting Sensing Sensitivity: 1 mV
Pulse Gen Serial Number: 111235

## 2019-04-20 ENCOUNTER — Other Ambulatory Visit (HOSPITAL_COMMUNITY): Payer: Self-pay | Admitting: *Deleted

## 2019-04-20 ENCOUNTER — Telehealth (HOSPITAL_COMMUNITY): Payer: Self-pay | Admitting: *Deleted

## 2019-04-20 MED ORDER — SACUBITRIL-VALSARTAN 24-26 MG PO TABS: 1.0000 | ORAL_TABLET | Freq: Two times a day (BID) | ORAL | 11 refills | Status: DC

## 2019-04-20 NOTE — Telephone Encounter (Signed)
Pt returned her completed forms for [employer] pt assit program, forms and order signed by Dr Haroldine Laws and faxed to [employer].

## 2019-04-21 ENCOUNTER — Encounter: Payer: Self-pay | Admitting: Cardiology

## 2019-04-21 NOTE — Progress Notes (Signed)
Remote ICD transmission.   

## 2019-04-25 ENCOUNTER — Other Ambulatory Visit (HOSPITAL_COMMUNITY): Payer: Self-pay | Admitting: Cardiology

## 2019-04-25 ENCOUNTER — Telehealth (HOSPITAL_COMMUNITY): Payer: Self-pay | Admitting: *Deleted

## 2019-04-25 MED ORDER — CARVEDILOL 12.5 MG PO TABS: ORAL_TABLET | ORAL | 3 refills | Status: DC

## 2019-04-25 NOTE — Telephone Encounter (Signed)
[employer] pt assist foundation (NPAF) application for [employer] assistance was faxed on 04/20/19.  Received letter today, pt has been approved through the remainder of the calendar year.

## 2019-05-11 ENCOUNTER — Telehealth: Payer: Self-pay | Admitting: Gastroenterology

## 2019-05-11 NOTE — Telephone Encounter (Signed)
I spoke with the pt and she states she would like to speak with [name] to discuss.  She says that [name] is familiar with her.  She is aware that [name] will be back next week and is ok with waiting.

## 2019-05-11 NOTE — Telephone Encounter (Signed)
Pt requested a call to discuss [place] and related paperwork.

## 2019-05-14 ENCOUNTER — Encounter: Payer: Self-pay | Admitting: Internal Medicine

## 2019-05-14 ENCOUNTER — Encounter (HOSPITAL_COMMUNITY): Payer: Self-pay

## 2019-05-16 MED ORDER — SERTRALINE HCL 25 MG PO TABS: 25.0000 mg | ORAL_TABLET | Freq: Every day | ORAL | 1 refills | Status: DC

## 2019-05-16 MED ORDER — ATORVASTATIN CALCIUM 40 MG PO TABS: 40.0000 mg | ORAL_TABLET | Freq: Every day | ORAL | 3 refills | Status: DC

## 2019-05-16 MED ORDER — PANTOPRAZOLE SODIUM 40 MG PO TBEC: 40.0000 mg | DELAYED_RELEASE_TABLET | Freq: Every day | ORAL | 3 refills | Status: DC

## 2019-05-16 NOTE — Telephone Encounter (Signed)
Spoke with Ms Such, she had several issues regarding her husband, see phone note for Mr. Prisma Decarlo DOB 06/27/45.

## 2019-06-01 NOTE — Progress Notes (Addendum)
Virtual Visit via Video Note  I connected with patient on 05/03/19 at  2:00 PM EDT by audio enabled telemedicine application and verified that I am speaking with the correct person using two identifiers.   THIS ENCOUNTER IS A VIRTUAL VISIT DUE TO COVID-19 - PATIENT WAS NOT SEEN IN THE OFFICE. PATIENT HAS CONSENTED TO VIRTUAL VISIT / TELEMEDICINE VISIT   Location of patient: home  Location of provider: office  I discussed the limitations of evaluation and management by telemedicine and the availability of in person appointments. The patient expressed understanding and agreed to proceed.   Subjective:   LASONDRA HODGKINS is a 68 y.o. female who presents for Medicare Annual (Subsequent) preventive examination.  Enjoys reading and crossword puzzles.   Review of Systems: No ROS.  Medicare Wellness Virtual Visit.  Visual/audio telehealth visit, UTA vital signs.   See social history for additional risk factors. Cardiac Risk Factors include: advanced age (>87men, >61 women) Sleep patterns: Takes Ambien nightly. Sleeps 8-9 hrs. Feels rested. Home Safety/Smoke Alarms: Feels safe in home. Smoke alarms in place.  Lives with husband in tri-level home. No issues with stairs.  Female:   Pap-  11/05/17     Mammo- no longer doing since bilateral mastectomy      Dexa scan-  ordered    CCS- 12/25/11. Recall 10 yrs    Objective:     Advanced Directives 06/02/2019 11/20/2015 10/06/2013 02/04/2013 10/20/2012  Does Patient Have a Medical Advance Directive? No No Patient does not have advance directive Patient does not have advance directive Patient does not have advance directive  Would patient like information on creating a medical advance directive? Yes (MAU/Ambulatory/Procedural Areas - Information given) Yes - Educational materials given - - -  Pre-existing out of facility DNR order (yellow form or pink MOST form) - - No No No    Tobacco Social History   Tobacco Use  Smoking Status Never  Smoker  Smokeless Tobacco Never Used     Counseling given: Not Answered   Clinical Intake:     Pain : No/denies pain                 Past Medical History:  Diagnosis Date  . Breast CA ([place])    twice first on L breast 1982 w/ chest wall involvment, then a second breast ca on the R in 1987  . Chronic systolic CHF (congestive heart failure) ([place])    a. due to Adriamycin,s/p ICD;  b. 01/2013 Gen change and new LV lead - BSX Energen CRT-D BiV ICD, Ser # [id number]  . Depression   . Diverticulitis   . Early menopause    early 55s  . Fatigue 02/20/2016  . Glaucoma suspect of both eyes    Dr. Marshall Cork  . Nonischemic cardiomyopathy ([place])   . OSA (obstructive sleep apnea) 02/07/2016  . Osteopenia   . Renal calculus   . Snoring 02/20/2016   Past Surgical History:  Procedure Laterality Date  . BI-VENTRICULAR IMPLANTABLE CARDIOVERTER DEFIBRILLATOR UPGRADE N/A 02/04/2013   Procedure: BI-VENTRICULAR IMPLANTABLE CARDIOVERTER DEFIBRILLATOR UPGRADE;  Surgeon: Evans Lance, MD;  Location: [facility] CATH LAB;  Service: Cardiovascular;  Laterality: N/A;  . CARDIAC DEFIBRILLATOR PLACEMENT     AICD Replaced-5/09 and 2014  . LEFT AND RIGHT HEART CATHETERIZATION WITH CORONARY ANGIOGRAM N/A 10/06/2013   Procedure: LEFT AND RIGHT HEART CATHETERIZATION WITH CORONARY ANGIOGRAM;  Surgeon: Jolaine Artist, MD;  Location: [facility] CATH LAB;  Service: Cardiovascular;  Laterality: N/A;  .  LITHOTRIPSY     h/o several procedures   . MASTECTOMY Bilateral   . OOPHORECTOMY Bilateral   . triger finger, R ring finger Right 02/2019  . VENOGRAM N/A 10/20/2012   Procedure: VENOGRAM;  Surgeon: Deboraha Sprang, MD;  Location: [facility] CATH LAB;  Service: Cardiovascular;  Laterality: N/A;   Family History  Problem Relation Age of Onset  . Kidney cancer Brother   . Breast cancer Mother        M and sister   . Lung cancer Father   . Breast cancer Sister   . Heart attack Neg Hx   . Diabetes Neg Hx   . Colon cancer  Neg Hx    Social History   Socioeconomic History  . Marital status: Married    Spouse name: Not on file  . Number of children: 3  . Years of education: Not on file  . Highest education level: Not on file  Occupational History  . 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 Financial resource strain: Not on file  . Food insecurity    Worry: Not on file    Inability: Not on file  . Transportation needs    Medical: Not on file    Non-medical: Not on file  Tobacco Use  . Smoking status: Never Smoker  . Smokeless tobacco: Never Used  Substance and Sexual Activity  . Alcohol use: Yes    Comment: socially/occassionally  . Drug use: No  . Sexual activity: Not on file  Lifestyle  . Physical activity    Days per week: Not on file    Minutes per session: Not on file  . Stress: Not on file  Relationships  . Social Herbalist on phone: Not on file    Gets together: Not on file    Attends religious service: Not on file    Active member of club or organization: Not on file    Attends meetings of clubs or organizations: Not on file    Relationship status: Not on file  Other Topics Concern  . Not on file  Social History Narrative   Related to Mr and Mrs Huston Foley, they are my patients as well    Married, 3 children all in [place], 7 Gkids     Siblings, 2 deceased, 2 living brothers     Outpatient Encounter Medications as of 06/02/2019  Medication Sig  . ascorbic acid (VITAMIN C) 1000 MG tablet Take 1,000 mg by mouth daily.  Marland Kitchen atorvastatin (LIPITOR) 40 MG tablet Take 1 tablet (40 mg total) by mouth daily.  . Biotin 5000 MCG CAPS Take 5,000 mcg by mouth daily.   . carvedilol (COREG) 12.5 MG tablet Take 1/2 tablet in the morning and 1 tablet at night. (Patient taking differently: Take 1 tablet in the morning and 1 tablet at night.)  . clopidogrel (PLAVIX) 75 MG tablet Take 1 tablet (75 mg total) by mouth daily.  .  digoxin (LANOXIN) 0.125 MG tablet TAKE 1 TABLET BY MOUTH  DAILY  . furosemide (LASIX) 40 MG tablet TAKE 2 TABLETS BY MOUTH  EVERY MORNING AND 1 TABLET  EVERY EVENING  . multivitamin (THERAGRAN) per tablet Take 1 tablet by mouth daily.   . pantoprazole (PROTONIX) 40 MG tablet Take 1 tablet (40 mg total) by mouth daily.  . potassium chloride (K-DUR) 10 MEQ tablet TAKE 3 TABLETS BY MOUTH  DAILY  .  Probiotic Product (PROBIOTIC DAILY PO) Take 1 capsule by mouth daily.   . sacubitril-valsartan (ENTRESTO) 24-26 MG Take 1 tablet by mouth 2 (two) times daily.  . sertraline (ZOLOFT) 25 MG tablet Take 1 tablet (25 mg total) by mouth daily.  Marland Kitchen spironolactone (ALDACTONE) 25 MG tablet TAKE 1 TABLET BY MOUTH  DAILY  . vitamin B-12 (CYANOCOBALAMIN) 1000 MCG tablet Take 1,000 mcg by mouth daily.  Marland Kitchen zolpidem (AMBIEN) 10 MG tablet TAKE ONE TABLET BY MOUTH AT BEDTIME as needed for sleep  . gabapentin (NEURONTIN) 300 MG capsule Take 300 mg by mouth daily as needed (for shingles flares).    No facility-administered encounter medications on file as of 06/02/2019.     Activities of Daily Living In your present state of health, do you have any difficulty performing the following activities: 06/02/2019 09/23/2018  Hearing? N N  Vision? N N  Difficulty concentrating or making decisions? N N  Walking or climbing stairs? N N  Dressing or bathing? N N  Doing errands, shopping? N N  Preparing Food and eating ? N -  Using the Toilet? N -  In the past six months, have you accidently leaked urine? N -  Do you have problems with loss of bowel control? N -  Managing your Medications? N -  Managing your Finances? N -  Housekeeping or managing your Housekeeping? N -  Some recent data might be hidden    Patient Care Team: Colon Branch, MD as PCP - General Bensimhon, Shaune Pascal, MD as Consulting Physician (Cardiology) Hortencia Pilar, MD as Consulting Physician (Ophthalmology)    Assessment:   This is a routine  wellness examination for [place]. Physical assessment deferred to PCP.  Exercise Activities and Dietary recommendations Current Exercise Habits: The patient does not participate in regular exercise at present, Exercise limited by: None identified Diet (meal preparation, eat out, water intake, caffeinated beverages, dairy products, fruits and vegetables): well balanced, on average, 3 meals per day   Goals    . Maintain current health       Fall Risk Fall Risk  06/02/2019 09/23/2018 08/05/2017 07/02/2016 02/06/2016  Falls in the past year? 0 No No No No    Depression Screen PHQ 2/9 Scores 06/02/2019 09/23/2018 08/05/2017 08/05/2017  PHQ - 2 Score 0 0 0 0  PHQ- 9 Score - 0 0 -     Cognitive Function Ad8 score reviewed for issues:  Issues making decisions:no  Less interest in hobbies / activities:no  Repeats questions, stories (family complaining):no  Trouble using ordinary gadgets (microwave, computer, phone):no  Forgets the month or year: no  Mismanaging finances: no  Remembering appts:no  Daily problems with thinking and/or memory:no Ad8 score is=0         Immunization History  Administered Date(s) Administered  . Influenza Split 11/06/2011, 08/17/2012  . Influenza Whole 10/01/2007, 08/29/2008, 09/21/2009, 08/12/2010  . Influenza, High Dose Seasonal PF 08/05/2017, 09/23/2018  . Influenza,inj,Quad PF,6+ Mos 11/10/2013, 09/11/2014  . Influenza-Unspecified 09/25/2015  . Pneumococcal Conjugate-13 09/11/2014  . Pneumococcal Polysaccharide-23 03/14/2002, 10/01/2007, 07/02/2016  . Td 03/14/2002  . Tdap 08/17/2012  . Zoster 11/19/2012   Screening Tests Health Maintenance  Topic Date Due  . INFLUENZA VACCINE  06/25/2019  . COLONOSCOPY  12/24/2021  . TETANUS/TDAP  08/17/2022  . DEXA SCAN  Completed  . Hepatitis C Screening  Completed  . PNA vac Low Risk Adult  Completed      Plan:    See you next year!  I have mailed you Advance Directive forms.  I have ordered  your bone density . Please call to schedule.  Continue to eat heart healthy diet (full of fruits, vegetables, whole grains, lean protein, water--limit salt, fat, and sugar intake) and increase physical activity as tolerated.  Continue doing brain stimulating activities (puzzles, reading, adult coloring books, staying active) to keep memory sharp.    I have personally reviewed and noted the following in the patient's chart:   . Medical and social history . Use of alcohol, tobacco or illicit drugs  . Current medications and supplements . Functional ability and status . Nutritional status . Physical activity . Advanced directives . List of other physicians . Hospitalizations, surgeries, and ER visits in previous 12 months . Vitals . Screenings to include cognitive, depression, and falls . Referrals and appointments  In addition, I have reviewed and discussed with patient certain preventive protocols, quality metrics, and best practice recommendations. A written personalized care plan for preventive services as well as general preventive health recommendations were provided to patient.     Naaman Plummer [place], [place]  06/02/2019  Kathlene November, MD

## 2019-06-02 ENCOUNTER — Ambulatory Visit (INDEPENDENT_AMBULATORY_CARE_PROVIDER_SITE_OTHER): Payer: Medicare Other | Admitting: *Deleted

## 2019-06-02 ENCOUNTER — Encounter: Payer: Self-pay | Admitting: *Deleted

## 2019-06-02 ENCOUNTER — Other Ambulatory Visit: Payer: Self-pay

## 2019-06-02 DIAGNOSIS — Z780 Asymptomatic menopausal state: Secondary | ICD-10-CM

## 2019-06-02 DIAGNOSIS — Z0000 Encounter for general adult medical examination without abnormal findings: Secondary | ICD-10-CM

## 2019-06-02 NOTE — Patient Instructions (Signed)
See you next year!  I have mailed you Advance Directive forms.  I have ordered your bone density . Please call to schedule.  Continue to eat heart healthy diet (full of fruits, vegetables, whole grains, lean protein, water--limit salt, fat, and sugar intake) and increase physical activity as tolerated.  Continue doing brain stimulating activities (puzzles, reading, adult coloring books, staying active) to keep memory sharp.    Ms. Quin , Thank you for taking time to come for your Medicare Wellness Visit. I appreciate your ongoing commitment to your health goals. Please review the following plan we discussed and let me know if I can assist you in the future.   These are the goals we discussed: Goals    . Maintain current health       This is a list of the screening recommended for you and due dates:  Health Maintenance  Topic Date Due  . Flu Shot  06/25/2019  . Colon Cancer Screening  12/24/2021  . Tetanus Vaccine  08/17/2022  . DEXA scan (bone density measurement)  Completed  .  Hepatitis C: One time screening is recommended by Center for Disease Control  (CDC) for  adults born from [birth year] through 1967.   Completed  . Pneumonia vaccines  Completed    Health Maintenance After Age 31 After age 16, you are at a higher risk for certain long-term diseases and infections as well as injuries from falls. Falls are a major cause of broken bones and head injuries in people who are older than age 21. Getting regular preventive care can help to keep you healthy and well. Preventive care includes getting regular testing and making lifestyle changes as recommended by your health care provider. Talk with your health care provider about:  Which screenings and tests you should have. A screening is a test that checks for a disease when you have no symptoms.  A diet and exercise plan that is right for you. What should I know about screenings and tests to prevent falls? Screening and testing  are the best ways to find a health problem early. Early diagnosis and treatment give you the best chance of managing medical conditions that are common after age 49. Certain conditions and lifestyle choices may make you more likely to have a fall. Your health care provider may recommend:  Regular vision checks. Poor vision and conditions such as cataracts can make you more likely to have a fall. If you wear glasses, make sure to get your prescription updated if your vision changes.  Medicine review. Work with your health care provider to regularly review all of the medicines you are taking, including over-the-counter medicines. Ask your health care provider about any side effects that may make you more likely to have a fall. Tell your health care provider if any medicines that you take make you feel dizzy or sleepy.  Osteoporosis screening. Osteoporosis is a condition that causes the bones to get weaker. This can make the bones weak and cause them to break more easily.  Blood pressure screening. Blood pressure changes and medicines to control blood pressure can make you feel dizzy.  Strength and balance checks. Your health care provider may recommend certain tests to check your strength and balance while standing, walking, or changing positions.  Foot health exam. Foot pain and numbness, as well as not wearing proper footwear, can make you more likely to have a fall.  Depression screening. You may be more likely to have a fall if  you have a fear of falling, feel emotionally low, or feel unable to do activities that you used to do.  Alcohol use screening. Using too much alcohol can affect your balance and may make you more likely to have a fall. What actions can I take to lower my risk of falls? General instructions  Talk with your health care provider about your risks for falling. Tell your health care provider if: ? You fall. Be sure to tell your health care provider about all falls, even ones  that seem minor. ? You feel dizzy, sleepy, or off-balance.  Take over-the-counter and prescription medicines only as told by your health care provider. These include any supplements.  Eat a healthy diet and maintain a healthy weight. A healthy diet includes low-fat dairy products, low-fat (lean) meats, and fiber from whole grains, beans, and lots of fruits and vegetables. Home safety  Remove any tripping hazards, such as rugs, cords, and clutter.  Install safety equipment such as grab bars in bathrooms and safety rails on stairs.  Keep rooms and walkways well-lit. Activity   Follow a regular exercise program to stay fit. This will help you maintain your balance. Ask your health care provider what types of exercise are appropriate for you.  If you need a cane or walker, use it as recommended by your health care provider.  Wear supportive shoes that have nonskid soles. Lifestyle  Do not drink alcohol if your health care provider tells you not to drink.  If you drink alcohol, limit how much you have: ? 0-1 drink a day for women. ? 0-2 drinks a day for men.  Be aware of how much alcohol is in your drink. In the [place] one drink equals one typical bottle of beer (12 oz), one-half glass of wine (5 oz), or one shot of hard liquor (1 oz).  Do not use any products that contain nicotine or tobacco, such as cigarettes and e-cigarettes. If you need help quitting, ask your health care provider. Summary  Having a healthy lifestyle and getting preventive care can help to protect your health and wellness after age 16.  Screening and testing are the best way to find a health problem early and help you avoid having a fall. Early diagnosis and treatment give you the best chance for managing medical conditions that are more common for people who are older than age 31.  Falls are a major cause of broken bones and head injuries in people who are older than age 38. Take precautions to prevent a fall  at home.  Work with your health care provider to learn what changes you can make to improve your health and wellness and to prevent falls. This information is not intended to replace advice given to you by your health care provider. Make sure you discuss any questions you have with your health care provider. Document Released: 09/23/2017 Document Revised: 03/03/2019 Document Reviewed: 09/23/2017 Elsevier Patient Education  2020 [employer].

## 2019-06-06 ENCOUNTER — Ambulatory Visit (HOSPITAL_BASED_OUTPATIENT_CLINIC_OR_DEPARTMENT_OTHER)
Admission: RE | Admit: 2019-06-06 | Discharge: 2019-06-06 | Disposition: A | Discharge status: Home / Self Care | Payer: Medicare Other | Source: Ambulatory Visit | Attending: Internal Medicine | Admitting: Internal Medicine

## 2019-06-06 ENCOUNTER — Other Ambulatory Visit: Payer: Self-pay

## 2019-06-06 DIAGNOSIS — Z780 Asymptomatic menopausal state: Secondary | ICD-10-CM | POA: Diagnosis not present

## 2019-06-06 DIAGNOSIS — M85851 Other specified disorders of bone density and structure, right thigh: Secondary | ICD-10-CM | POA: Diagnosis not present

## 2019-06-09 ENCOUNTER — Other Ambulatory Visit: Payer: Self-pay

## 2019-06-09 ENCOUNTER — Ambulatory Visit (HOSPITAL_COMMUNITY)
Admission: RE | Admit: 2019-06-09 | Discharge: 2019-06-09 | Disposition: A | Discharge status: Home / Self Care | Payer: Medicare Other | Source: Ambulatory Visit | Attending: Internal Medicine | Admitting: Internal Medicine

## 2019-06-09 ENCOUNTER — Encounter (HOSPITAL_COMMUNITY): Payer: Self-pay | Admitting: Internal Medicine

## 2019-06-09 VITALS — BP 116/57 | HR 71 | Wt 179.2 lb

## 2019-06-09 DIAGNOSIS — Z8249 Family history of ischemic heart disease and other diseases of the circulatory system: Secondary | ICD-10-CM | POA: Insufficient documentation

## 2019-06-09 DIAGNOSIS — I5022 Chronic systolic (congestive) heart failure: Secondary | ICD-10-CM

## 2019-06-09 DIAGNOSIS — Z8673 Personal history of transient ischemic attack (TIA), and cerebral infarction without residual deficits: Secondary | ICD-10-CM | POA: Diagnosis not present

## 2019-06-09 DIAGNOSIS — Z87442 Personal history of urinary calculi: Secondary | ICD-10-CM | POA: Diagnosis not present

## 2019-06-09 DIAGNOSIS — Z7902 Long term (current) use of antithrombotics/antiplatelets: Secondary | ICD-10-CM | POA: Insufficient documentation

## 2019-06-09 DIAGNOSIS — Z881 Allergy status to other antibiotic agents status: Secondary | ICD-10-CM | POA: Insufficient documentation

## 2019-06-09 DIAGNOSIS — Z853 Personal history of malignant neoplasm of breast: Secondary | ICD-10-CM | POA: Insufficient documentation

## 2019-06-09 DIAGNOSIS — M8580 Other specified disorders of bone density and structure, unspecified site: Secondary | ICD-10-CM | POA: Insufficient documentation

## 2019-06-09 DIAGNOSIS — I428 Other cardiomyopathies: Secondary | ICD-10-CM | POA: Insufficient documentation

## 2019-06-09 DIAGNOSIS — Z79899 Other long term (current) drug therapy: Secondary | ICD-10-CM | POA: Insufficient documentation

## 2019-06-09 DIAGNOSIS — Z9013 Acquired absence of bilateral breasts and nipples: Secondary | ICD-10-CM | POA: Insufficient documentation

## 2019-06-09 DIAGNOSIS — Z95810 Presence of automatic (implantable) cardiac defibrillator: Secondary | ICD-10-CM | POA: Diagnosis not present

## 2019-06-09 DIAGNOSIS — G4733 Obstructive sleep apnea (adult) (pediatric): Secondary | ICD-10-CM | POA: Diagnosis not present

## 2019-06-09 LAB — CBC
HCT: 43.1 % (ref 36.0–46.0)
Hemoglobin: 14.4 g/dL (ref 12.0–15.0)
MCH: 30.8 pg (ref 26.0–34.0)
MCHC: 33.4 g/dL (ref 30.0–36.0)
MCV: 92.1 fL (ref 80.0–100.0)
Platelets: 257 10*3/uL (ref 150–400)
RBC: 4.68 MIL/uL (ref 3.87–5.11)
RDW: 12.4 % (ref 11.5–15.5)
WBC: 6.4 10*3/uL (ref 4.0–10.5)
nRBC: 0 % (ref 0.0–0.2)

## 2019-06-09 LAB — BASIC METABOLIC PANEL
Anion gap: 11 (ref 5–15)
BUN: 12 mg/dL (ref 8–23)
CO2: 23 mmol/L (ref 22–32)
Calcium: 9.9 mg/dL (ref 8.9–10.3)
Chloride: 105 mmol/L (ref 98–111)
Creatinine, Ser: 0.8 mg/dL (ref 0.44–1.00)
GFR calc Af Amer: >60 mL/min (ref >60)
GFR calc non Af Amer: >60 mL/min (ref >60)
Glucose, Bld: 122 mg/dL — ABNORMAL HIGH (ref 70–99)
Potassium: 4.2 mmol/L (ref 3.5–5.1)
Sodium: 139 mmol/L (ref 135–145)

## 2019-06-09 LAB — DIGOXIN LEVEL: Digoxin Level: 0.5 ng/mL — ABNORMAL LOW (ref 0.8–2.0)

## 2019-06-09 LAB — BRAIN NATRIURETIC PEPTIDE: B Natriuretic Peptide: 78 pg/mL (ref 0.0–100.0)

## 2019-06-09 MED ORDER — SPIRONOLACTONE 25 MG PO TABS: 25.0000 mg | ORAL_TABLET | Freq: Every day | ORAL | 3 refills | Status: DC

## 2019-06-09 MED ORDER — ENTRESTO 24-26 MG PO TABS: 1.0000 | ORAL_TABLET | Freq: Two times a day (BID) | ORAL | 11 refills | Status: DC

## 2019-06-09 MED ORDER — CLOPIDOGREL BISULFATE 75 MG PO TABS: 75.0000 mg | ORAL_TABLET | Freq: Every day | ORAL | 3 refills | Status: DC

## 2019-06-09 MED ORDER — ATORVASTATIN CALCIUM 40 MG PO TABS: 40.0000 mg | ORAL_TABLET | Freq: Every day | ORAL | 3 refills | Status: DC

## 2019-06-09 MED ORDER — DIGOXIN 125 MCG PO TABS: 125.0000 ug | ORAL_TABLET | Freq: Every day | ORAL | 3 refills | Status: DC

## 2019-06-09 MED ORDER — FUROSEMIDE 40 MG PO TABS: ORAL_TABLET | ORAL | 3 refills | Status: DC

## 2019-06-09 MED ORDER — CARVEDILOL 12.5 MG PO TABS: ORAL_TABLET | ORAL | 3 refills | Status: DC

## 2019-06-09 MED ORDER — POTASSIUM CHLORIDE CRYS ER 10 MEQ PO TBCR: 30.0000 meq | EXTENDED_RELEASE_TABLET | Freq: Every day | ORAL | 3 refills | Status: DC

## 2019-06-09 NOTE — Progress Notes (Signed)
ADVANCED HEART FAILURE CLINIC NOTE  Patient ID: ASAL TEAS, female   DOB: 1951/05/02, 68 y.o.   MRN: [id number] PCP: Dr Larose Kells EP: Dr Caryl Comes  Subjective  Ms. Kawa is a 68 y/o woman with h/o chronic systolic presumed due to chemotherapy-related cardiomyopathy diagnosed with breast cancer in 1982 in [place]. Treated with chemo (including adriamycin)/XRT and L mastectomy. Had R breast cancer (unrelated) in 1987. Has been cancer free since. Denies HTN, HL, DM2.  Was first diagnosed with HF in 1988. Had left heart cath she thinks in 2009. 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 Echo 2009 EF 20% with moderate AI and mild to moderate MR. Had [place] ICD placed and then LV lead became nonfunctional. In 3/14, underwent CRT revision.   Previously on coumadin due to cardiomyopathy but discontinued. Had sleep study 1/17 very mild OSA (6.2/hr) not using CPAP.   Admitted in 6/18 withTIA. Unable to use right hand and speech slurred. CT normal. Unable to do MRI.   She returns today for regular follow up. Doing well. Doing all activities without too much problem. Mowing the grass at the church and doing the trim without too much problem. Does get SOB with walking fast and going up steps. SBP on the low side. Gets lightheaded when she stands up several times per week. No ICD firings.   Echo 12/19 EF 25% RV ok   ICD interrogated personally: Active 3.2 hrs daily on average. Occasional NSVT noted. Avg HR 74, Avg RR 15  ECHO 09/2013 with Dr. Wynonia Lawman and EF 10-15% with significant dyssynchrony. Subsequently had LV lead checked and was functioning well.  ECHO 7/15 EF 10-15%, moderate to severe RV dysfunction ECHO 4/16 EF 15% normal RV moderate AI ECHO 9/17 EF 20-25% RV ok. Severe MR mild AI/AS Echo 6/18 EF 20-25% RV ok Mild to moderate AI   CPX 2/19 FVC 3.41 (117%)    FEV1 2.45 (108%)     FEV1/FVC 72 (92%)     MVV 78 (89%)    Resting HR: 74 Peak HR: 121   (80% age predicted max HR) BP rest: 108/56 BP peak: 136/58 Peak VO2: 18.3 (103% predicted peak VO2) VE/VCO2 slope: 30 OUES: 2.45 Peak RER: 0.95  CPX 10/17  FVC 3.44 (113%)    FEV1 2.32 (97%)     FEV1/FVC 67 (86%)     MVV 90 (101%)    Resting HR: 72 Peak HR: 112 (73% age predicted max HR)  BP rest: 104/64 BP peak: 146/64 Peak VO2: 16.0 (91% predicted peak VO2) VE/VCO2 slope: 34.6 OUES: 1.90 Peak RER: 1.10 Ventilatory Threshold: 13.5 (76.9% predicted or measured peak VO2) VE/MVV: 64% PETCO2 at peak: 29 O2pulse: 13  (130% predicted O2pulse)  LHC/RHC 10/06/13  RA = 11  RV = 54/9/13  PA = 53/27 (38)  PCW = 23  Fick cardiac output/index = 3.8/2.0  Thermo CO/CI = 3.4/1.8  PVR = 3.9 WU (Fick)  FA sat = 98%  PA sat = 62%, 64%  SVC = 59%  Coronaries no significant disease but there was a fistula from small D1 => PA.   CPX 10/25/13  Peak VO2: 15.6 (81.2% predicted peak VO2) VE/VCO2 slope: 36.9 OUES: 1.25 Peak RER: 1.08  CPX 05/24/14 FVC 3.05 (92%)  FEV1 2.23 (88%)  FEV1/FVC 73%  MVV 92 (100%) Resting HR: 64 Peak HR: 132 (85% age predicted max HR) BP rest: 108/70 BP peak: 130/66  Peak VO2: 16.4 (81.1% predicted peak VO2) VE/VCO2 slope:  33.6 OUES: 1.74 Peak RER: 1.16 Ventilatory Threshold: 12.5 (61.8% predicted peak VO2) VE/MVV: 62% PETCO2 at peak: 31 O2pulse: 12 (109% predicted O2pulse)  Lab 10/06/13 K 2.8 Creatinine 0.83 Labs 10/31/13 K 3.0 Creatinine 0.68  Dig level 0.9 Pro BNP 3906  Labs 11/11/13  K 3.6 Creatinine 0.8 Labs 12/16/13 K 3.8 Creatinine 0.8  Labs 4/15 K 4, creatinine 0.8, LDL 128, TSH normal, digoxin 0.6 Labs 6/15 K 4.1 Cr 0.8 digoxin 0.7 Labs 8/15 K 4.2, creatinine 0.78, pBNP 2126 Labs 8/15 K 4.2, creatinine 1.0 dig <0.5  Blood type A+   FHX: Had older brother with HF in his 45s. No strong FHx of HF.   SocHx: Retired. Non-smoker. Rare ETOH. 3 grown kids.   Review of systems complete and found to be negative unless listed in  HPI.    Past Medical History:  Diagnosis Date  . Breast CA ([place])    twice first on L breast 1982 w/ chest wall involvment, then a second breast ca on the R in 1987  . Chronic systolic CHF (congestive heart failure) ([place])    a. due to Adriamycin,s/p ICD;  b. 01/2013 Gen change and new LV lead - BSX Energen CRT-D BiV ICD, Ser # [id number]  . Depression   . Diverticulitis   . Early menopause    early 79s  . Fatigue 02/20/2016  . Glaucoma suspect of both eyes    Dr. Marshall Cork  . Nonischemic cardiomyopathy ([place])   . OSA (obstructive sleep apnea) 02/07/2016  . Osteopenia   . Renal calculus   . Snoring 02/20/2016    Past Surgical History:  Procedure Laterality Date  . BI-VENTRICULAR IMPLANTABLE CARDIOVERTER DEFIBRILLATOR UPGRADE N/A 02/04/2013   Procedure: BI-VENTRICULAR IMPLANTABLE CARDIOVERTER DEFIBRILLATOR UPGRADE;  Surgeon: Evans Lance, MD;  Location: [facility] CATH LAB;  Service: Cardiovascular;  Laterality: N/A;  . CARDIAC DEFIBRILLATOR PLACEMENT     AICD Replaced-5/09 and 2014  . LEFT AND RIGHT HEART CATHETERIZATION WITH CORONARY ANGIOGRAM N/A 10/06/2013   Procedure: LEFT AND RIGHT HEART CATHETERIZATION WITH CORONARY ANGIOGRAM;  Surgeon: Jolaine Artist, MD;  Location[facility] C[facility] LAB;  Service: Cardiovascular;  Laterality: N/A;  . LITHOTRIPSY     h/o several procedures   . MASTECTOMY Bilateral   . OOPHORECTOMY Bilateral   . triger finger, R ring finger Right 02/2019  . VENOGRAM N/A 10/20/2012   Procedure: VENOGRAM;  Surgeon: Deboraha Sprang, MD;  Location: [facility] CATH LAB;  Service: Cardiovascular;  Laterality: N/A;    Current Outpatient Medications  Medication Sig Dispense Refill  . ascorbic acid (VITAMIN C) 1000 MG tablet Take 1,000 mg by mouth daily.    Marland Kitchen atorvastatin (LIPITOR) 40 MG tablet Take 1 tablet (40 mg total) by mouth daily. 90 tablet 3  . Biotin 5000 MCG CAPS Take 5,000 mcg by mouth daily.     . carvedilol (COREG) 12.5 MG tablet Take 1/2 tablet in the morning and 1  tablet at night. (Patient taking differently: Take 1 tablet in the morning and 1 tablet at night.) 180 tablet 3  . clopidogrel (PLAVIX) 75 MG tablet Take 1 tablet (75 mg total) by mouth daily. 90 tablet 3  . digoxin (LANOXIN) 0.125 MG tablet TAKE 1 TABLET BY MOUTH  DAILY 90 tablet 3  . furosemide (LASIX) 40 MG tablet TAKE 2 TABLETS BY MOUTH  EVERY MORNING AND 1 TABLET  EVERY EVENING 270 tablet 3  . gabapentin (NEURONTIN) 300 MG capsule Take 300 mg by mouth daily as needed (for  shingles flares).     . multivitamin (THERAGRAN) per tablet Take 1 tablet by mouth daily.     . pantoprazole (PROTONIX) 40 MG tablet Take 1 tablet (40 mg total) by mouth daily. 90 tablet 3  . potassium chloride (K-DUR) 10 MEQ tablet TAKE 3 TABLETS BY MOUTH  DAILY 270 tablet 3  . Probiotic Product (PROBIOTIC DAILY PO) Take 1 capsule by mouth daily.     . sacubitril-valsartan (ENTRESTO) 24-26 MG Take 1 tablet by mouth 2 (two) times daily. 60 tablet 11  . sertraline (ZOLOFT) 25 MG tablet Take 1 tablet (25 mg total) by mouth daily. 90 tablet 1  . spironolactone (ALDACTONE) 25 MG tablet TAKE 1 TABLET BY MOUTH  DAILY 90 tablet 3  . vitamin B-12 (CYANOCOBALAMIN) 1000 MCG tablet Take 1,000 mcg by mouth daily.    Marland Kitchen zolpidem (AMBIEN) 10 MG tablet TAKE ONE TABLET BY MOUTH AT BEDTIME as needed for sleep 90 tablet 1   No current facility-administered medications for this encounter.    Allergies  Allergen Reactions  . Atacand [Candesartan] Other (See Comments)    Causes fatigue   . Cephalexin Hives and Itching  . Latex Rash   Physical Exam  Vitals:   06/09/19 1050  BP: (!) 116/57  Pulse: 71  SpO2: 99%  Weight: 81.3 kg (179 lb 3.2 oz)   Wt Readings from Last 3 Encounters:  06/09/19 81.3 kg (179 lb 3.2 oz)  02/10/19 83.3 kg (183 lb 9.6 oz)  09/23/18 82.6 kg (182 lb)   General:  Well appearing. No resp difficulty HEENT: normal Neck: supple. no JVD. Carotids 2+ bilat; no bruits. No lymphadenopathy or thryomegaly  appreciated. Cor: PMI nondisplaced. Regular rate & rhythm. No rubs, gallops or murmurs. Lungs: clear Abdomen: soft, nontender, nondistended. No hepatosplenomegaly. No bruits or masses. Good bowel sounds. Extremities: no cyanosis, clubbing, rash, edema Neuro: alert & orientedx3, cranial nerves grossly intact. moves all 4 extremities w/o difficulty. Affect pleasant   Assessment and  Plan  1) Chronic systolic HF:  - Nonischemic cardiomyopathy, possibly anthracycline cardiotoxicity.  LHC 11/14 without significant coronary disease.    Has [employer] CRT-D. ECHO 09/2013 EF 10-15%. Echo [facility] 4/16 EF 15% moderate AI.  - Echo 9/17 EF 20-25%.  RV ok. Severe MR mild AI/AS  Seen in [facility] and will follow prn - Echo 04/2017 LVEF 20-25%, Grade 1 DD. - Echo 12/19 EF 20-25% RV ok - NYHA II-III symptoms.  - Volume status stable on exam - ICD interrogated personally. No VT/AF  - Continue lasix 80 mg q am and 40 mg q pm. Sometimes skips evening tablet.  - Continue Entresto 24/26 mg BID, carvedilol 6.25/12.5 , digoxin, spironolactone 25 mg daily. Has not been able to tolerate higher doses due to low BP. We discussed trying to wean lasix in favor of more Entresto or addition of SGLT2i but has failed higher doses of Entresto before. - Has previously seen Dr. Stann Mainland at [facility] and will refer back as needed. (Blood type A positive). Given results of CPX testing no need for advanced therapies at this point.  -Continues to do well despite markedly depressed LVEF. CPX 2/19  improved. NYHA II-III. Volume status ok. Unable to tolerate higher doses of meds. We discussed fact that she is close to aging out of transplant eligibility but currently doesn't qualify from functional standpoint. We discussed LVAD as possibility dow the road and she said she would likely not be interested in that. We will continue  to follow.  - Check labs today - Repeat CPX in 6 months 2) h/o bilateral breast CA in 1982 and  1987 (treated with Adriamycin in 1982) - Stable 3) ICD  - follows with Dr. Caryl Comes 4) h/o TIA.  - Continue Plavix and statin.    Glori Bickers, MD 06/09/2019

## 2019-06-09 NOTE — Patient Instructions (Signed)
Lab work done today. We will notify you of any abnormal lab work. No news is good news.  REFILLED medications to [facility] has recommended that you have a cardiopulmonary stress test (CPX). CPX testing is a non-invasive measurement of heart and lung function. It replaces a traditional treadmill stress test. This type of test provides a tremendous amount of information that relates not only to your present condition but also for future outcomes. This test combines measurements of you ventilation, respiratory gas exchange in the lungs, electrocardiogram (EKG), blood pressure and physical response before, during, and following an exercise protocol. Someone will contact you in order to set up an appointment.  Please follow up with the [facility] in 6 months.  At the [facility], you and your health needs are our priority. As part of our continuing mission to provide you with exceptional heart care, we have created designated Provider Care Teams. These Care Teams include your primary Cardiologist (physician) and Advanced Practice Providers (APPs- Physician Assistants and Nurse Practitioners) who all work together to provide you with the care you need, when you need it.   You may see any of the following providers on your designated Care Team at your next follow up: Marland Kitchen Dr Glori Bickers . Dr Loralie Champagne . Darrick Grinder, NP

## 2019-06-11 NOTE — Addendum Note (Signed)
Encounter addended by: Jolaine Artist, MD on: 06/11/2019 10:16 PM  Actions taken: Charge Capture section accepted

## 2019-07-11 ENCOUNTER — Encounter: Payer: Medicare Other | Admitting: *Deleted

## 2019-07-12 ENCOUNTER — Other Ambulatory Visit: Payer: Self-pay

## 2019-07-12 ENCOUNTER — Telehealth: Payer: Self-pay

## 2019-07-12 ENCOUNTER — Encounter: Payer: Self-pay | Admitting: Internal Medicine

## 2019-07-12 ENCOUNTER — Ambulatory Visit (INDEPENDENT_AMBULATORY_CARE_PROVIDER_SITE_OTHER): Payer: Medicare Other | Admitting: Internal Medicine

## 2019-07-12 VITALS — BP 100/44 | HR 81 | Temp 97.4°F | Resp 16 | Ht 64.0 in | Wt 179.1 lb

## 2019-07-12 DIAGNOSIS — N390 Urinary tract infection, site not specified: Secondary | ICD-10-CM

## 2019-07-12 DIAGNOSIS — R319 Hematuria, unspecified: Secondary | ICD-10-CM | POA: Diagnosis not present

## 2019-07-12 LAB — POC URINALSYSI DIPSTICK (AUTOMATED)
Bilirubin, UA: NEGATIVE
Glucose, UA: NEGATIVE
Ketones, UA: NEGATIVE
Nitrite, UA: NEGATIVE
Protein, UA: NEGATIVE
Spec Grav, UA: 1.01 (ref 1.010–1.025)
Urobilinogen, UA: 0.2 E.U./dL
pH, UA: 6.5 (ref 5.0–8.0)

## 2019-07-12 LAB — URINALYSIS, ROUTINE W REFLEX MICROSCOPIC
Bilirubin Urine: NEGATIVE
Ketones, ur: NEGATIVE
Nitrite: NEGATIVE
Specific Gravity, Urine: 1.01 (ref 1.000–1.030)
Total Protein, Urine: NEGATIVE
Urine Glucose: NEGATIVE
Urobilinogen, UA: 0.2 (ref 0.0–1.0)
pH: 7 (ref 5.0–8.0)

## 2019-07-12 MED ORDER — SULFAMETHOXAZOLE-TRIMETHOPRIM 800-160 MG PO TABS: 1.0000 | ORAL_TABLET | Freq: Two times a day (BID) | ORAL | 0 refills | Status: DC

## 2019-07-12 NOTE — Progress Notes (Signed)
Subjective:    Patient ID: [name], female    DOB: 1950/12/16, 68 y.o.   MRN: [id number]  DOS:  07/12/2019 Type of visit - description: Acute visit Symptoms started 3 days ago with urinary frequency, gradually worse Today developed suprapubic pressure. She suspects has a  UTI  BP is low today, has been low in the last few weeks.  No symptoms, denies dizziness, weakness  BP Readings from Last 3 Encounters:  07/12/19 (!) 100/44  06/09/19 (!) 116/57  02/10/19 126/70    Review of Systems No fever chills No nausea or vomiting No flank pain No dysuria or gross hematuria  Past Medical History:  Diagnosis Date  . Breast CA ([place])    twice first on L breast 1982 w/ chest wall involvment, then a second breast ca on the R in 1987  . Chronic systolic CHF (congestive heart failure) ([place])    a. due to Adriamycin,s/p ICD;  b. 01/2013 Gen change and new LV lead - BSX [employer] CRT-D BiV ICD, Ser # [id number]  . Depression   . Diverticulitis   . Early menopause    early 75s  . Fatigue 02/20/2016  . Glaucoma suspect of both eyes    Dr. Marshall Cork  . Nonischemic cardiomyopathy ([place])   . OSA (obstructive sleep apnea) 02/07/2016  . Osteopenia   . Renal calculus   . Snoring 02/20/2016    Past Surgical History:  Procedure Laterality Date  . BI-VENTRICULAR IMPLANTABLE CARDIOVERTER DEFIBRILLATOR UPGRADE N/A 02/04/2013   Procedure: BI-VENTRICULAR IMPLANTABLE CARDIOVERTER DEFIBRILLATOR UPGRADE;  Surgeon: Evans Lance, MD;  Location: [facility] CATH LAB;  Service: Cardiovascular;  Laterality: N/A;  . CARDIAC DEFIBRILLATOR PLACEMENT     AICD Replaced-5/09 and 2014  . LEFT AND RIGHT HEART CATHETERIZATION WITH CORONARY ANGIOGRAM N/A 10/06/2013   Procedure: LEFT AND RIGHT HEART CATHETERIZATION WITH CORONARY ANGIOGRAM;  Surgeon: Jolaine Artist, MD;  Location: [facility] CATH LAB;  Service: Cardiovascular;  Laterality: N/A;  . LITHOTRIPSY     h/o several procedures   . MASTECTOMY Bilateral   .  OOPHORECTOMY Bilateral   . triger finger, R ring finger Right 02/2019  . VENOGRAM N/A 10/20/2012   Procedure: VENOGRAM;  Surgeon: Deboraha Sprang, MD;  Location: [facility] CATH LAB;  Service: Cardiovascular;  Laterality: N/A;    Social History   Socioeconomic History  . Marital status: Married    Spouse name: Not on file  . Number of children: 3  . Years of education: Not on file  . Highest education level: Not on file  Occupational History  . 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 Financial resource strain: Not on file  . Food insecurity    Worry: Not on file    Inability: Not on file  . Transportation needs    Medical: Not on file    Non-medical: Not on file  Tobacco Use  . Smoking status: Never Smoker  . Smokeless tobacco: Never Used  Substance and Sexual Activity  . Alcohol use: Yes    Comment: socially/occassionally  . Drug use: No  . Sexual activity: Not on file  Lifestyle  . Physical activity    Days per week: Not on file    Minutes per session: Not on file  . Stress: Not on file  Relationships  . Social Herbalist on phone: Not on file    Gets together: Not  on file    Attends religious service: Not on file    Active member of club or organization: Not on file    Attends meetings of clubs or organizations: Not on file    Relationship status: Not on file  . Intimate partner violence    Fear of current or ex partner: Not on file    Emotionally abused: Not on file    Physically abused: Not on file    Forced sexual activity: Not on file  Other Topics Concern  . Not on file  Social History Narrative   Related to Mr and Mrs Huston Foley, they are my patients as well    Married, 3 children all in [place], 7 Gkids     Siblings, 2 deceased, 2 living brothers       Allergies as of 07/12/2019      Reactions   Atacand [candesartan] Other (See Comments)   Causes fatigue   Cephalexin Hives, Itching    Latex Rash      Medication List       Accurate as of July 12, 2019 11:59 PM. If you have any questions, ask your nurse or doctor.        ascorbic acid 1000 MG tablet Commonly known as: VITAMIN C Take 1,000 mg by mouth daily.   atorvastatin 40 MG tablet Commonly known as: Lipitor Take 1 tablet (40 mg total) by mouth daily.   Biotin 5000 MCG Caps Take 5,000 mcg by mouth daily.   carvedilol 12.5 MG tablet Commonly known as: COREG Take 1 tablet in the morning and 1 tablet at night.   clopidogrel 75 MG tablet Commonly known as: PLAVIX Take 1 tablet (75 mg total) by mouth daily.   digoxin 0.125 MG tablet Commonly known as: LANOXIN Take 1 tablet (125 mcg total) by mouth daily.   Entresto 24-26 MG Generic drug: sacubitril-valsartan Take 1 tablet by mouth 2 (two) times daily.   furosemide 40 MG tablet Commonly known as: LASIX TAKE 2 TABLETS BY MOUTH  EVERY MORNING AND 1 TABLET  EVERY EVENING   gabapentin 300 MG capsule Commonly known as: NEURONTIN Take 300 mg by mouth daily as needed (for shingles flares).   multivitamin per tablet Take 1 tablet by mouth daily.   pantoprazole 40 MG tablet Commonly known as: PROTONIX Take 1 tablet (40 mg total) by mouth daily.   potassium chloride 10 MEQ tablet Commonly known as: K-DUR Take 3 tablets (30 mEq total) by mouth daily.   PROBIOTIC DAILY PO Take 1 capsule by mouth daily.   sertraline 25 MG tablet Commonly known as: ZOLOFT Take 1 tablet (25 mg total) by mouth daily.   spironolactone 25 MG tablet Commonly known as: ALDACTONE Take 1 tablet (25 mg total) by mouth daily.   sulfamethoxazole-trimethoprim 800-160 MG tablet Commonly known as: BACTRIM DS Take 1 tablet by mouth 2 (two) times daily. Started by: Kathlene November, MD   vitamin B-12 1000 MCG tablet Commonly known as: CYANOCOBALAMIN Take 1,000 mcg by mouth daily.   zolpidem 10 MG tablet Commonly known as: AMBIEN TAKE ONE TABLET BY MOUTH AT BEDTIME as needed  for sleep           Objective:   Physical Exam BP (!) 100/44 (BP Location: Left Arm, Patient Position: Sitting, Cuff Size: Small)   Pulse 81   Temp (!) 97.4 F (36.3 C) (Temporal)   Resp 16   Ht 5\' 4"  (1.626 m)   Wt 179 lb 2 oz (81.3 kg)  SpO2 97%   BMI 30.75 kg/m  General:   Well developed, NAD, BMI noted.  HEENT:  Normocephalic . Face symmetric, atraumatic  Abdomen:  Not distended, soft, minimal discomfort to palpation of the lower abdomen, no rebound or rigidity.  No CVA tenderness Skin: Not pale. Not jaundice Neurologic:  alert & oriented X3.  Speech normal, gait appropriate for age and unassisted Psych--  Cognition and judgment appear intact.  Cooperative with normal attention span and concentration.  Behavior appropriate. No anxious or depressed appearing.     Assessment     Assessment Hyperglycemia A1c 5.9 (04/2017) CV:Dr. Caryl Comes and [facility] --CHF, nonischemic cardiomyopathy:due to chemotherapy --MR severe --VT -TIA 05-2017 Depression Mild OSA per sleep study 11-2015, saw Dr Radford Pax 02-20-16: no need for CPAP Osteopenia-- DEXA 06/13/2015 normal Vitamin D deficiency Kidney stones Menopause Breast cancer x 2: first in 1982 with chest wall involvement, 2nd on the R dx  1987, s/p B mastectomy; + BRACA Handicap sticker signed [phone] Post herpetic neuralgia (shingles 2014) , R face-- gaba prn  PLAN: UTI: Dip showing leukocytes and red cells, check a UA, urine culture, start Bactrim. CHF: BP low today, has been low lately, she is taking 2 Lasix in the morning but the Lasix in the afternoon is as needed only.  Recommend to continue monitoring BPs and reach out to cardiology if BP remains low.

## 2019-07-12 NOTE — Telephone Encounter (Signed)
Copied from [place] [id number]. Topic: General - Other >> Jul 12, 2019  7:22 AM Keene Breath wrote: Reason for CRM: CB# [phone], Schedule appt. For today for possible UTI.

## 2019-07-12 NOTE — Patient Instructions (Signed)
Take the antibiotics  Drink plenty of fluids  Call if no better in few days  Probiotics daily

## 2019-07-12 NOTE — Progress Notes (Signed)
Pre visit review using our clinic review tool, if applicable. No additional management support is needed unless otherwise documented below in the visit note. 

## 2019-07-12 NOTE — Telephone Encounter (Signed)
Left message for patient to remind of missed remote transmission.  

## 2019-07-13 NOTE — Assessment & Plan Note (Signed)
UTI: Dip showing leukocytes and red cells, check a UA, urine culture, start Bactrim. CHF: BP low today, has been low lately, she is taking 2 Lasix in the morning but the Lasix in the afternoon is as needed only.  Recommend to continue monitoring BPs and reach out to cardiology if BP remains low.

## 2019-07-14 LAB — URINE CULTURE
MICRO NUMBER:: 783459
SPECIMEN QUALITY:: ADEQUATE

## 2019-07-20 ENCOUNTER — Telehealth (HOSPITAL_COMMUNITY): Payer: Self-pay | Admitting: Internal Medicine

## 2019-07-20 NOTE — Telephone Encounter (Signed)
Called and left message for patient to call Kristen's number.  Need to schedule appt for CPX.

## 2019-08-26 ENCOUNTER — Other Ambulatory Visit: Payer: Self-pay

## 2019-08-29 ENCOUNTER — Other Ambulatory Visit: Payer: Self-pay

## 2019-08-29 ENCOUNTER — Encounter: Payer: Self-pay | Admitting: Internal Medicine

## 2019-08-29 ENCOUNTER — Ambulatory Visit (INDEPENDENT_AMBULATORY_CARE_PROVIDER_SITE_OTHER): Payer: Medicare Other | Admitting: Internal Medicine

## 2019-08-29 VITALS — BP 123/64 | HR 69 | Temp 96.8°F | Resp 16 | Ht 64.0 in | Wt 181.0 lb

## 2019-08-29 DIAGNOSIS — Z0000 Encounter for general adult medical examination without abnormal findings: Secondary | ICD-10-CM

## 2019-08-29 MED ORDER — SHINGRIX 50 MCG/0.5ML IM SUSR: 0.5000 mL | Freq: Once | INTRAMUSCULAR | 1 refills | Status: AC

## 2019-08-29 NOTE — Progress Notes (Signed)
Pre visit review using our clinic review tool, if applicable. No additional management support is needed unless otherwise documented below in the visit note. 

## 2019-08-29 NOTE — Progress Notes (Signed)
Subjective:    Patient ID: [name], female    DOB: 1950/12/18, 68 y.o.   MRN: [id number]  DOS:  08/29/2019 Type of visit - description: CPX No major concerns, feeling well.   Review of Systems    A 14 point review of systems is negative   Past Medical History:  Diagnosis Date  . Breast CA ([place])    twice first on L breast 1982 w/ chest wall involvment, then a second breast ca on the R in 1987  . Chronic systolic CHF (congestive heart failure) ([place])    a. due to Adriamycin,s/p ICD;  b. 01/2013 Gen change and new LV lead - BSX Energen CRT-D BiV ICD, Ser # [id number]  . Depression   . Diverticulitis   . Early menopause    early 18s  . Fatigue 02/20/2016  . Glaucoma suspect of both eyes    Dr. Marshall Cork  . Nonischemic cardiomyopathy ([place])   . OSA (obstructive sleep apnea) 02/07/2016  . Osteopenia   . Renal calculus   . Snoring 02/20/2016    Past Surgical History:  Procedure Laterality Date  . BI-VENTRICULAR IMPLANTABLE CARDIOVERTER DEFIBRILLATOR UPGRADE N/A 02/04/2013   Procedure: BI-VENTRICULAR IMPLANTABLE CARDIOVERTER DEFIBRILLATOR UPGRADE;  Surgeon: Evans Lance, MD;  Location: [facility] CATH LAB;  Service: Cardiovascular;  Laterality: N/A;  . CARDIAC DEFIBRILLATOR PLACEMENT     AICD Replaced-5/09 and 2014  . LEFT AND RIGHT HEART CATHETERIZATION WITH CORONARY ANGIOGRAM N/A 10/06/2013   Procedure: LEFT AND RIGHT HEART CATHETERIZATION WITH CORONARY ANGIOGRAM;  Surgeon: Jolaine Artist, MD;  Location[facility] CATH LAB;  Service: Cardiovascular;  Laterality: N/A;  . LITHOTRIPSY     h/o several procedures   . MASTECTOMY Bilateral   . OOPHORECTOMY Bilateral   . triger finger, R ring finger Right 02/2019  . VENOGRAM N/A 10/20/2012   Procedure: VENOGRAM;  Surgeon: Deboraha Sprang, MD;  Location: [facility] CATH LAB;  Service: Cardiovascular;  Laterality: N/A;    Social History   Socioeconomic History  . Marital status: Married    Spouse name: Not on file  . Number of  children: 3  . Years of education: Not on file  . Highest education level: Not on file  Occupational History  . 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 Financial resource strain: Not on file  . Food insecurity    Worry: Not on file    Inability: Not on file  . Transportation needs    Medical: Not on file    Non-medical: Not on file  Tobacco Use  . Smoking status: Never Smoker  . Smokeless tobacco: Never Used  Substance and Sexual Activity  . Alcohol use: Yes    Comment: socially/occassionally  . Drug use: No  . Sexual activity: Not on file  Lifestyle  . Physical activity    Days per week: Not on file    Minutes per session: Not on file  . Stress: Not on file  Relationships  . Social Herbalist on phone: Not on file    Gets together: Not on file    Attends religious service: Not on file    Active member of club or organization: Not on file    Attends meetings of clubs or organizations: Not on file    Relationship status: Not on file  . Intimate partner violence    Fear of current or ex partner:  Not on file    Emotionally abused: Not on file    Physically abused: Not on file    Forced sexual activity: Not on file  Other Topics Concern  . Not on file  Social History Narrative   Related to Mr and Mrs Huston Foley, they are my patients as well    Married, 3 children all in [place], 7 Gkids     Siblings, 2 deceased, 2 living brothers      Family History  Problem Relation Age of Onset  . Kidney cancer Brother   . Breast cancer Mother        M and sister   . Lung cancer Father   . Breast cancer Sister   . Heart attack Neg Hx   . Diabetes Neg Hx   . Colon cancer Neg Hx      Allergies as of 08/29/2019      Reactions   Atacand [candesartan] Other (See Comments)   Causes fatigue   Cephalexin Hives, Itching   Latex Rash      Medication List       Accurate as of August 29, 2019 11:59 PM. If  you have any questions, ask your nurse or doctor.        STOP taking these medications   sulfamethoxazole-trimethoprim 800-160 MG tablet Commonly known as: BACTRIM DS Stopped by: Kathlene November, MD     TAKE these medications   ascorbic acid 1000 MG tablet Commonly known as: VITAMIN C Take 1,000 mg by mouth daily.   atorvastatin 40 MG tablet Commonly known as: Lipitor Take 1 tablet (40 mg total) by mouth daily.   Biotin 5000 MCG Caps Take 5,000 mcg by mouth daily.   carvedilol 12.5 MG tablet Commonly known as: COREG Take 1 tablet in the morning and 1 tablet at night.   clopidogrel 75 MG tablet Commonly known as: PLAVIX Take 1 tablet (75 mg total) by mouth daily.   digoxin 0.125 MG tablet Commonly known as: LANOXIN Take 1 tablet (125 mcg total) by mouth daily.   Entresto 24-26 MG Generic drug: sacubitril-valsartan Take 1 tablet by mouth 2 (two) times daily.   furosemide 40 MG tablet Commonly known as: LASIX TAKE 2 TABLETS BY MOUTH  EVERY MORNING AND 1 TABLET  EVERY EVENING   gabapentin 300 MG capsule Commonly known as: NEURONTIN Take 300 mg by mouth daily as needed (for shingles flares).   multivitamin per tablet Take 1 tablet by mouth daily.   pantoprazole 40 MG tablet Commonly known as: PROTONIX Take 1 tablet (40 mg total) by mouth daily.   potassium chloride 10 MEQ tablet Commonly known as: KLOR-CON Take 3 tablets (30 mEq total) by mouth daily.   PROBIOTIC DAILY PO Take 1 capsule by mouth daily.   sertraline 25 MG tablet Commonly known as: ZOLOFT Take 1 tablet (25 mg total) by mouth daily.   Shingrix injection Generic drug: Zoster Vaccine Adjuvanted Inject 0.5 mLs into the muscle once for 1 dose. Started by: Kathlene November, MD   spironolactone 25 MG tablet Commonly known as: ALDACTONE Take 1 tablet (25 mg total) by mouth daily.   vitamin B-12 1000 MCG tablet Commonly known as: CYANOCOBALAMIN Take 1,000 mcg by mouth daily.   zolpidem 10 MG tablet  Commonly known as: AMBIEN TAKE ONE TABLET BY MOUTH AT BEDTIME as needed for sleep           Objective:   Physical Exam BP 123/64 (BP Location: Left Arm, Patient Position: Sitting, Cuff  Size: Small)   Pulse 69   Temp (!) 96.8 F (36 C) (Temporal)   Resp 16   Ht 5\' 4"  (1.626 m)   Wt 181 lb (82.1 kg)   SpO2 99%   BMI 31.07 kg/m  General: Well developed, NAD, BMI noted Neck: No  thyromegaly  HEENT:  Normocephalic . Face symmetric, atraumatic Lungs:  CTA B Normal respiratory effort, no intercostal retractions, no accessory muscle use. Heart: RRR, noticeable systolic murmur, more so on the left side of the chest.  No pretibial edema bilaterally  Abdomen:  Not distended, soft, non-tender. No rebound or rigidity.   Skin: Exposed areas without rash. Not pale. Not jaundice Neurologic:  alert & oriented X3.  Speech normal, gait appropriate for age and unassisted Strength symmetric and appropriate for age.  Psych: Cognition and judgment appear intact.  Cooperative with normal attention span and concentration.  Behavior appropriate. No anxious or depressed appearing.     Assessment     Assessment Hyperglycemia A1c 5.9 (04/2017) CV:Dr. Caryl Comes and Bensimohn --CHF, nonischemic cardiomyopathy:due to chemotherapy --MR severe --VT -TIA 05-2017 Depression Mild OSA per sleep study 11-2015, saw Dr Radford Pax 02-20-16: no need for CPAP Osteopenia-- DEXA 06/13/2015 normal Vitamin D deficiency Kidney stones Menopause Breast cancer x 2: first in 1982 with chest wall involvement, 2nd on the R dx  1987, s/p B mastectomy; + BRACA Handicap sticker signed [phone] Post herpetic neuralgia (shingles 2014) , R face-- gaba prn  PLAN: Here for CPX Chronic medical problems: Stable.  Continue the same medications. Return to the office 6 months

## 2019-08-29 NOTE — Patient Instructions (Signed)
  GO TO THE FRONT DESK Schedule your next appointment in 6 months

## 2019-08-30 NOTE — Assessment & Plan Note (Addendum)
-  Here for CPX Chronic medical problems: Stable.  Continue the same medications. Return to the office 6 months

## 2019-08-30 NOTE — Assessment & Plan Note (Signed)
-  Td 2013 -  pnm 23:  2017 - prevnar:2015 - zostavax 2013 - shingrix: Pros and cons discussed, prescription reprinted today - had a flu shot   - Cscope 2003, and 11-2011, next 10 years (Dr Deatra Ina) -Female care: (-) 10-2017, PAP. No further cervical ca screening  MMG-- h/o mastectomy , self chest examination normal per patient DEXA 2016, next 2021 -Labs reviewed, none needed today. -She remains active and is trying to eat healthy

## 2019-09-05 DIAGNOSIS — M1711 Unilateral primary osteoarthritis, right knee: Secondary | ICD-10-CM | POA: Diagnosis not present

## 2019-09-05 DIAGNOSIS — M7051 Other bursitis of knee, right knee: Secondary | ICD-10-CM | POA: Diagnosis not present

## 2019-09-09 ENCOUNTER — Ambulatory Visit (INDEPENDENT_AMBULATORY_CARE_PROVIDER_SITE_OTHER): Payer: Medicare Other | Admitting: *Deleted

## 2019-09-09 DIAGNOSIS — I428 Other cardiomyopathies: Secondary | ICD-10-CM | POA: Diagnosis not present

## 2019-09-09 DIAGNOSIS — I5022 Chronic systolic (congestive) heart failure: Secondary | ICD-10-CM

## 2019-09-12 LAB — CUP PACEART REMOTE DEVICE CHECK
Battery Remaining Longevity: 36 mo
Battery Remaining Percentage: 57 %
Brady Statistic RA Percent Paced: 0 %
Brady Statistic RV Percent Paced: 100 %
Date Time Interrogation Session: 20201016042400
HighPow Impedance: 61 Ohm
Implantable Lead Implant Date: 20050701
Implantable Lead Implant Date: 20050701
Implantable Lead Implant Date: 20140314
Implantable Lead Location: 753858
Implantable Lead Location: 753859
Implantable Lead Location: 753860
Implantable Lead Model: 158
Implantable Lead Model: 4196
Implantable Lead Model: 5076
Implantable Lead Serial Number: 156416
Implantable Pulse Generator Implant Date: 20140314
Lead Channel Impedance Value: 458 Ohm
Lead Channel Impedance Value: 562 Ohm
Lead Channel Impedance Value: 881 Ohm
Lead Channel Pacing Threshold Amplitude: 0.6 V
Lead Channel Pacing Threshold Amplitude: 0.7 V
Lead Channel Pacing Threshold Amplitude: 1.5 V
Lead Channel Pacing Threshold Pulse Width: 0.4 ms
Lead Channel Pacing Threshold Pulse Width: 0.6 ms
Lead Channel Pacing Threshold Pulse Width: 0.6 ms
Lead Channel Setting Pacing Amplitude: 2 V
Lead Channel Setting Pacing Amplitude: 2.2 V
Lead Channel Setting Pacing Amplitude: 2.4 V
Lead Channel Setting Pacing Pulse Width: 0.6 ms
Lead Channel Setting Pacing Pulse Width: 1 ms
Lead Channel Setting Sensing Sensitivity: 0.5 mV
Lead Channel Setting Sensing Sensitivity: 1 mV
Pulse Gen Serial Number: 111235

## 2019-09-14 NOTE — Progress Notes (Signed)
Remote ICD transmission.   

## 2019-10-23 ENCOUNTER — Telehealth: Payer: Self-pay | Admitting: Internal Medicine

## 2019-10-24 NOTE — Telephone Encounter (Signed)
Ambien refill.   Last OV: 08/29/2019 Last Fill: 12/16/2018 #90 and 1RF Pt sig: 1 tab qhs prn UDS: Ambien only

## 2019-10-24 NOTE — Telephone Encounter (Signed)
Sent!

## 2019-10-31 ENCOUNTER — Other Ambulatory Visit (HOSPITAL_COMMUNITY)
Admission: RE | Admit: 2019-10-31 | Discharge: 2019-10-31 | Disposition: A | Discharge status: Home / Self Care | Payer: Medicare Other | Source: Ambulatory Visit | Attending: Internal Medicine | Admitting: Internal Medicine

## 2019-10-31 DIAGNOSIS — Z20828 Contact with and (suspected) exposure to other viral communicable diseases: Secondary | ICD-10-CM | POA: Diagnosis not present

## 2019-10-31 DIAGNOSIS — Z01812 Encounter for preprocedural laboratory examination: Secondary | ICD-10-CM | POA: Diagnosis not present

## 2019-11-01 LAB — NOVEL CORONAVIRUS, NAA (HOSP ORDER, SEND-OUT TO REF LAB; TAT 18-24 HRS): SARS-CoV-2, NAA: NOT DETECTED

## 2019-11-03 ENCOUNTER — Ambulatory Visit (HOSPITAL_COMMUNITY): Payer: Medicare Other | Attending: Cardiology

## 2019-11-03 ENCOUNTER — Other Ambulatory Visit: Payer: Self-pay

## 2019-11-03 DIAGNOSIS — I5022 Chronic systolic (congestive) heart failure: Secondary | ICD-10-CM | POA: Diagnosis not present

## 2019-11-04 ENCOUNTER — Other Ambulatory Visit (HOSPITAL_COMMUNITY): Payer: Self-pay

## 2019-11-04 ENCOUNTER — Other Ambulatory Visit (HOSPITAL_COMMUNITY): Payer: Self-pay | Admitting: *Deleted

## 2019-11-04 DIAGNOSIS — I5022 Chronic systolic (congestive) heart failure: Secondary | ICD-10-CM | POA: Diagnosis not present

## 2019-11-04 NOTE — Progress Notes (Signed)
cpx order placed per [facility]

## 2019-11-10 ENCOUNTER — Telehealth (HOSPITAL_COMMUNITY): Payer: Self-pay | Admitting: Pharmacy Technician

## 2019-11-10 NOTE — Telephone Encounter (Signed)
Spoke to patient regarding re-enrollment of Entresto in [employer]. She came and filled out the application last week, will send that in for her to [employer].  Will follow up.  Charlann Boxer, CPhT

## 2019-11-11 DIAGNOSIS — H40013 Open angle with borderline findings, low risk, bilateral: Secondary | ICD-10-CM | POA: Diagnosis not present

## 2019-11-11 DIAGNOSIS — H524 Presbyopia: Secondary | ICD-10-CM | POA: Diagnosis not present

## 2019-11-11 DIAGNOSIS — H2513 Age-related nuclear cataract, bilateral: Secondary | ICD-10-CM | POA: Diagnosis not present

## 2019-11-11 DIAGNOSIS — H353131 Nonexudative age-related macular degeneration, bilateral, early dry stage: Secondary | ICD-10-CM | POA: Diagnosis not present

## 2019-11-11 DIAGNOSIS — H35033 Hypertensive retinopathy, bilateral: Secondary | ICD-10-CM | POA: Diagnosis not present

## 2019-12-08 ENCOUNTER — Encounter: Payer: Self-pay | Admitting: Internal Medicine

## 2019-12-09 ENCOUNTER — Ambulatory Visit (INDEPENDENT_AMBULATORY_CARE_PROVIDER_SITE_OTHER): Payer: Medicare Other | Admitting: *Deleted

## 2019-12-09 DIAGNOSIS — Z95810 Presence of automatic (implantable) cardiac defibrillator: Secondary | ICD-10-CM | POA: Diagnosis not present

## 2019-12-09 LAB — CUP PACEART REMOTE DEVICE CHECK
Battery Remaining Longevity: 30 mo
Battery Remaining Percentage: 54 %
Brady Statistic RA Percent Paced: 0 %
Brady Statistic RV Percent Paced: 100 %
Date Time Interrogation Session: 20210115001200
HighPow Impedance: 60 Ohm
Implantable Lead Implant Date: 20050701
Implantable Lead Implant Date: 20050701
Implantable Lead Implant Date: 20140314
Implantable Lead Location: 753858
Implantable Lead Location: 753859
Implantable Lead Location: 753860
Implantable Lead Model: 158
Implantable Lead Model: 4196
Implantable Lead Model: 5076
Implantable Lead Serial Number: 156416
Implantable Pulse Generator Implant Date: 20140314
Lead Channel Impedance Value: 480 Ohm
Lead Channel Impedance Value: 511 Ohm
Lead Channel Impedance Value: 798 Ohm
Lead Channel Pacing Threshold Amplitude: 0.6 V
Lead Channel Pacing Threshold Amplitude: 0.7 V
Lead Channel Pacing Threshold Amplitude: 1.5 V
Lead Channel Pacing Threshold Pulse Width: 0.4 ms
Lead Channel Pacing Threshold Pulse Width: 0.6 ms
Lead Channel Pacing Threshold Pulse Width: 0.6 ms
Lead Channel Setting Pacing Amplitude: 2 V
Lead Channel Setting Pacing Amplitude: 2.2 V
Lead Channel Setting Pacing Amplitude: 2.4 V
Lead Channel Setting Pacing Pulse Width: 0.6 ms
Lead Channel Setting Pacing Pulse Width: 1 ms
Lead Channel Setting Sensing Sensitivity: 0.5 mV
Lead Channel Setting Sensing Sensitivity: 1 mV
Pulse Gen Serial Number: 111235

## 2019-12-09 NOTE — Progress Notes (Signed)
ICD remote 

## 2019-12-29 ENCOUNTER — Encounter: Payer: Self-pay | Admitting: Internal Medicine

## 2019-12-30 NOTE — Telephone Encounter (Signed)
Advanced Heart Failure Patient Advocate Encounter   Patient was approved to receive Entresto from [employer]  Patient ID: [id number] Effective dates: 12/08/19 through 11/23/20  Charlann Boxer, CPhT

## 2020-01-06 ENCOUNTER — Ambulatory Visit: Payer: Medicare Other | Attending: Internal Medicine

## 2020-01-06 DIAGNOSIS — Z23 Encounter for immunization: Secondary | ICD-10-CM | POA: Insufficient documentation

## 2020-01-06 NOTE — Progress Notes (Signed)
   Covid-19 Vaccination Clinic  Name:  [name]    MRN: [id number] DOB: Sep 10, 1951  01/06/2020  [name] was observed post Covid-19 immunization for 15 minutes without incidence. She was provided with Vaccine Information Sheet and instruction to access the V-Safe system.   [name] was instructed to call 911 with any severe reactions post vaccine: Marland Kitchen Difficulty breathing  . Swelling of your face and throat  . A fast heartbeat  . A bad rash all over your body  . Dizziness and weakness    Immunizations Administered    Name Date Dose VIS Date Route   Pfizer COVID-19 Vaccine 01/06/2020  5:16 PM 0.3 mL 11/04/2019 Intramuscular   Manufacturer: [place]   Lot: [id number]   [place]: [id number]

## 2020-01-29 ENCOUNTER — Ambulatory Visit: Payer: Medicare Other | Attending: Internal Medicine

## 2020-01-29 DIAGNOSIS — Z23 Encounter for immunization: Secondary | ICD-10-CM | POA: Insufficient documentation

## 2020-01-29 NOTE — Progress Notes (Signed)
   Covid-19 Vaccination Clinic  Name:  [name]    MRN: [id number] DOB: [birth year]  01/29/2020  [name] was observed post Covid-19 immunization for 30 minutes based on pre-vaccination screening without incident. She was provided with Vaccine Information Sheet and instruction to access the V-Safe system.   [name] was instructed to call 911 with any severe reactions post vaccine: Marland Kitchen Difficulty breathing  . Swelling of face and throat  . A fast heartbeat  . A bad rash all over body  . Dizziness and weakness   Immunizations Administered    Name Date Dose VIS Date Route   Pfizer COVID-19 Vaccine 01/29/2020  8:51 AM 0.3 mL 11/04/2019 Intramuscular   Manufacturer: [place]   Lot: [id number]   [place]: [id number]

## 2020-02-14 ENCOUNTER — Encounter: Payer: Self-pay | Admitting: Internal Medicine

## 2020-02-14 ENCOUNTER — Other Ambulatory Visit: Payer: Self-pay

## 2020-02-14 ENCOUNTER — Ambulatory Visit: Payer: Medicare Other | Admitting: Internal Medicine

## 2020-02-14 VITALS — BP 108/64 | HR 69 | Resp 15 | Ht 64.0 in | Wt 179.6 lb

## 2020-02-14 DIAGNOSIS — I472 Ventricular tachycardia, unspecified: Secondary | ICD-10-CM

## 2020-02-14 DIAGNOSIS — I428 Other cardiomyopathies: Secondary | ICD-10-CM | POA: Diagnosis not present

## 2020-02-14 DIAGNOSIS — I4729 Other ventricular tachycardia: Secondary | ICD-10-CM

## 2020-02-14 DIAGNOSIS — I5022 Chronic systolic (congestive) heart failure: Secondary | ICD-10-CM

## 2020-02-14 DIAGNOSIS — Z95810 Presence of automatic (implantable) cardiac defibrillator: Secondary | ICD-10-CM | POA: Diagnosis not present

## 2020-02-14 DIAGNOSIS — Z79899 Other long term (current) drug therapy: Secondary | ICD-10-CM

## 2020-02-14 NOTE — Patient Instructions (Signed)
Medication Instructions:  Your physician recommends that you continue on your current medications as directed. Please refer to the Current Medication list given to you today.  Labwork: You will have labs drawn today: CBC, BMET and Digoxin level  Testing/Procedures: None ordered.  Follow-Up: Your physician wants you to follow-up in: 12 months with Dr Caryl Comes and 6 months with Heart Failure Clinic. You will receive a reminder letter in the mail two months in advance. If you don't receive a letter, please call our office to schedule the follow-up appointment.  Remote monitoring is used to monitor your Pacemaker of ICD from home. This monitoring reduces the number of office visits required to check your device to one time per year. It allows [place] to keep an eye on the functioning of your device to ensure it is working properly.   Any Other Special Instructions Will Be Listed Below (If Applicable).  If you need a refill on your cardiac medications before your next appointment, please call your pharmacy.

## 2020-02-14 NOTE — Progress Notes (Signed)
After watching   seen    Patient Care Team: Colon Branch, MD as PCP - General Bensimhon, Shaune Pascal, MD as Consulting Physician (Cardiology) Hortencia Pilar, MD as Consulting Physician (Ophthalmology) Rod Can, MD as Consulting Physician (Orthopedic Surgery)   HPI  [name] is a 69 y.o. female seen in followup for congestive heart failure in the setting of chemotherapy associated cardiomyopathy with previously implanted CRT. She did part of the MADIT CRT protocol; her LV lead had failed and was capped.  When she began to develop worsening symptoms of heart failure in the fall and in the winter 2014  she underwent CRT upgrade.   She has been followed by Dr. Wynonia Lawman and the heart failure clinic.  She has a history of a TIA.  9/18 aspirin was decreased to 81 and Plavix was added.  Heart failure notes from 12/18  were reviewed.  The patient denies chest painnocturnal dyspnea, orthopnea or peripheral edema.  There have been no palpitations, lightheadedness or syncope. shortness of breath at 100 yds or stairs   Her brother and nephew   Both died about 2-3 weeks ago-- devastating   DATE TEST EF   11/14 Echo   10-15 %   7/15 Echo   10-15 % Severe RV dysfn  9/17 Echo  20-25% AI mod MR severe  6/18 Echo  20-25% MR mild AI mod  12/19 Echo  20/25% MR severe  eccentric           Date Cr K Dig  3/18  0.91 4.4 0.7  12/18 0.96 4.2 0.3 (6/18)  7/20 0.8 4.5 0.5   Date LDL  8/17 118  5/20  60     Past Medical History:  Diagnosis Date  . Breast CA ([place])    twice first on L breast 1982 w/ chest wall involvment, then a second breast ca on the R in 1987  . Chronic systolic CHF (congestive heart failure) ([place])    a. due to Adriamycin,s/p ICD;  b. 01/2013 Gen change and new LV lead - BSX Energen CRT-D BiV ICD, Ser # [id number]  . Depression   . Diverticulitis   . Early menopause    early 63s  . Fatigue 02/20/2016  . Glaucoma suspect of both eyes    Dr. Marshall Cork   . Nonischemic cardiomyopathy ([place])   . OSA (obstructive sleep apnea) 02/07/2016  . Osteopenia   . Renal calculus   . Snoring 02/20/2016    Past Surgical History:  Procedure Laterality Date  . BI-VENTRICULAR IMPLANTABLE CARDIOVERTER DEFIBRILLATOR UPGRADE N/A 02/04/2013   Procedure: BI-VENTRICULAR IMPLANTABLE CARDIOVERTER DEFIBRILLATOR UPGRADE;  Surgeon: Evans Lance, MD;  Location: [facility] CATH LAB;  Service: Cardiovascular;  Laterality: N/A;  . CARDIAC DEFIBRILLATOR PLACEMENT     AICD Replaced-5/09 and 2014  . LEFT AND RIGHT HEART CATHETERIZATION WITH CORONARY ANGIOGRAM N/A 10/06/2013   Procedure: LEFT AND RIGHT HEART CATHETERIZATION WITH CORONARY ANGIOGRAM;  Surgeon: Jolaine Artist, MD;  Location: [facility] CATH LAB;  Service: Cardiovascular;  Laterality: N/A;  . LITHOTRIPSY     h/o several procedures   . MASTECTOMY Bilateral   . OOPHORECTOMY Bilateral   . triger finger, R ring finger Right 02/2019  . VENOGRAM N/A 10/20/2012   Procedure: VENOGRAM;  Surgeon: Deboraha Sprang, MD;  Location: [facility] CATH LAB;  Service: Cardiovascular;  Laterality: N/A;    Current Outpatient Medications  Medication Sig Dispense Refill  . ascorbic acid (VITAMIN C) 1000 MG tablet Take 1,000  mg by mouth daily.    Marland Kitchen atorvastatin (LIPITOR) 40 MG tablet Take 1 tablet (40 mg total) by mouth daily. 90 tablet 3  . Biotin 5000 MCG CAPS Take 5,000 mcg by mouth daily.     . carvedilol (COREG) 12.5 MG tablet Take 1 tablet in the morning and 1 tablet at night. 180 tablet 3  . clopidogrel (PLAVIX) 75 MG tablet Take 1 tablet (75 mg total) by mouth daily. 90 tablet 3  . digoxin (LANOXIN) 0.125 MG tablet Take 1 tablet (125 mcg total) by mouth daily. 90 tablet 3  . furosemide (LASIX) 40 MG tablet TAKE 2 TABLETS BY MOUTH  EVERY MORNING AND 1 TABLET  EVERY EVENING 270 tablet 3  . gabapentin (NEURONTIN) 300 MG capsule Take 300 mg by mouth daily as needed (for shingles flares).     . multivitamin (THERAGRAN) per tablet Take 1 tablet by  mouth daily.     . pantoprazole (PROTONIX) 40 MG tablet Take 1 tablet (40 mg total) by mouth daily. 90 tablet 3  . potassium chloride (K-DUR) 10 MEQ tablet Take 3 tablets (30 mEq total) by mouth daily. 270 tablet 3  . Probiotic Product (PROBIOTIC DAILY PO) Take 1 capsule by mouth daily.     . sacubitril-valsartan (ENTRESTO) 24-26 MG Take 1 tablet by mouth 2 (two) times daily. 60 tablet 11  . sertraline (ZOLOFT) 25 MG tablet Take 1 tablet (25 mg total) by mouth daily. 90 tablet 1  . spironolactone (ALDACTONE) 25 MG tablet Take 1 tablet (25 mg total) by mouth daily. 90 tablet 3  . vitamin B-12 (CYANOCOBALAMIN) 1000 MCG tablet Take 1,000 mcg by mouth daily.    Marland Kitchen zolpidem (AMBIEN) 10 MG tablet TAKE ONE TABLET BY MOUTH AT BEDTIME AS NEEDED FOR SLEEP  90 tablet 1   No current facility-administered medications for this visit.    Allergies  Allergen Reactions  . Atacand [Candesartan] Other (See Comments)    Causes fatigue   . Cephalexin Hives and Itching  . Latex Rash    Review of Systems negative except from HPI and PMH  Physical Exam BP 108/64   Pulse 69   Resp 15   Ht 5\' 4"  (1.626 m)   Wt 179 lb 9.6 oz (81.5 kg)   SpO2 98%   BMI 30.83 kg/m  Well developed and well nourished in no acute distress HENT normal Neck supple with JVP-8 Clear Device pocket well healed; without hematoma or erythema.  There is no tethering  Regular rate and rhythm, 3/6 murmur Abd-soft with active BS No Clubbing cyanosis no edema Skin-warm and dry A & Oriented  Grossly normal sensory and motor function  ECG P-synchronous/ AV  pacing with QRD upright V1 and neg 1    Assessment and  Plan  Nonischemic cardiomyopathy/MR-severe  Congestive heart failure chronic systolic  Ventricular tachycardia-nonsustained  Hyperlipidemia  TIA  Implantable defibrillator-[employer]-CRT  .   No intercurrent Ventricular tachycardia-treated   Euvolemic continue current meds  Need to check dig and  BMET

## 2020-02-15 LAB — CUP PACEART INCLINIC DEVICE CHECK
Brady Statistic RA Percent Paced: 1 % — CL
Brady Statistic RV Percent Paced: 100 %
Date Time Interrogation Session: 20210323000000
HighPow Impedance: 32 Ohm
HighPow Impedance: 62 Ohm
Implantable Lead Implant Date: 20050701
Implantable Lead Implant Date: 20050701
Implantable Lead Implant Date: 20140314
Implantable Lead Location: 753858
Implantable Lead Location: 753859
Implantable Lead Location: 753860
Implantable Lead Model: 158
Implantable Lead Model: 4196
Implantable Lead Model: 5076
Implantable Lead Serial Number: 156416
Implantable Pulse Generator Implant Date: 20140314
Lead Channel Impedance Value: 476 Ohm
Lead Channel Impedance Value: 589 Ohm
Lead Channel Impedance Value: 908 Ohm
Lead Channel Pacing Threshold Amplitude: 0.7 V
Lead Channel Pacing Threshold Amplitude: 0.7 V
Lead Channel Pacing Threshold Amplitude: 2.4 V
Lead Channel Pacing Threshold Pulse Width: 0.4 ms
Lead Channel Pacing Threshold Pulse Width: 0.6 ms
Lead Channel Pacing Threshold Pulse Width: 1 ms
Lead Channel Sensing Intrinsic Amplitude: 21.4 mV
Lead Channel Sensing Intrinsic Amplitude: 24.7 mV
Lead Channel Sensing Intrinsic Amplitude: 3.4 mV
Lead Channel Setting Pacing Amplitude: 2 V
Lead Channel Setting Pacing Amplitude: 2 V
Lead Channel Setting Pacing Amplitude: 3 V
Lead Channel Setting Pacing Pulse Width: 0.6 ms
Lead Channel Setting Pacing Pulse Width: 1 ms
Lead Channel Setting Sensing Sensitivity: 0.5 mV
Lead Channel Setting Sensing Sensitivity: 1 mV
Pulse Gen Serial Number: 111235

## 2020-02-15 LAB — BASIC METABOLIC PANEL
BUN/Creatinine Ratio: 13 (ref 12–28)
BUN: 11 mg/dL (ref 8–27)
CO2: 27 mmol/L (ref 20–29)
Calcium: 10 mg/dL (ref 8.7–10.3)
Chloride: 102 mmol/L (ref 96–106)
Creatinine, Ser: 0.82 mg/dL (ref 0.57–1.00)
GFR calc Af Amer: 84 mL/min/{1.73_m2} (ref >59)
GFR calc non Af Amer: 73 mL/min/{1.73_m2} (ref >59)
Glucose: 104 mg/dL — ABNORMAL HIGH (ref 65–99)
Potassium: 4.5 mmol/L (ref 3.5–5.2)
Sodium: 141 mmol/L (ref 134–144)

## 2020-02-15 LAB — CBC
Hematocrit: 40 % (ref 34.0–46.6)
Hemoglobin: 13.9 g/dL (ref 11.1–15.9)
MCH: 31.9 pg (ref 26.6–33.0)
MCHC: 34.8 g/dL (ref 31.5–35.7)
MCV: 92 fL (ref 79–97)
Platelets: 287 10*3/uL (ref 150–450)
RBC: 4.36 x10E6/uL (ref 3.77–5.28)
RDW: 12.5 % (ref 11.7–15.4)
WBC: 7.2 10*3/uL (ref 3.4–10.8)

## 2020-02-15 LAB — DIGOXIN LEVEL: Digoxin, Serum: 0.4 ng/mL — ABNORMAL LOW (ref 0.5–0.9)

## 2020-02-23 ENCOUNTER — Other Ambulatory Visit: Payer: Self-pay

## 2020-02-27 ENCOUNTER — Encounter: Payer: Self-pay | Admitting: Internal Medicine

## 2020-02-27 ENCOUNTER — Ambulatory Visit (INDEPENDENT_AMBULATORY_CARE_PROVIDER_SITE_OTHER): Payer: Medicare Other | Admitting: Internal Medicine

## 2020-02-27 ENCOUNTER — Other Ambulatory Visit: Payer: Self-pay

## 2020-02-27 VITALS — BP 98/34 | HR 60 | Temp 96.5°F | Resp 16 | Ht 64.0 in | Wt 180.5 lb

## 2020-02-27 DIAGNOSIS — G4700 Insomnia, unspecified: Secondary | ICD-10-CM

## 2020-02-27 DIAGNOSIS — E785 Hyperlipidemia, unspecified: Secondary | ICD-10-CM | POA: Diagnosis not present

## 2020-02-27 DIAGNOSIS — F32A Depression, unspecified: Secondary | ICD-10-CM

## 2020-02-27 DIAGNOSIS — F329 Major depressive disorder, single episode, unspecified: Secondary | ICD-10-CM | POA: Diagnosis not present

## 2020-02-27 DIAGNOSIS — R739 Hyperglycemia, unspecified: Secondary | ICD-10-CM | POA: Diagnosis not present

## 2020-02-27 DIAGNOSIS — I5022 Chronic systolic (congestive) heart failure: Secondary | ICD-10-CM

## 2020-02-27 LAB — LIPID PANEL
Cholesterol: 112 mg/dL (ref 0–200)
HDL: 51.1 mg/dL (ref >39.00)
LDL Cholesterol: 44 mg/dL (ref 0–99)
NonHDL: 60.66
Total CHOL/HDL Ratio: 2
Triglycerides: 85 mg/dL (ref 0.0–149.0)
VLDL: 17 mg/dL (ref 0.0–40.0)

## 2020-02-27 LAB — HEMOGLOBIN A1C: Hgb A1c MFr Bld: 6.1 % (ref 4.6–6.5)

## 2020-02-27 MED ORDER — ZOLPIDEM TARTRATE 10 MG PO TABS: 5.0000 mg | ORAL_TABLET | Freq: Every evening | ORAL | 1 refills | Status: DC | PRN

## 2020-02-27 NOTE — Patient Instructions (Addendum)
Check your blood pressure once or twice a week  BP GOAL is between 110/65 and  135/85. If it is consistently higher or lower, let me know    GO TO THE LAB : Get the blood work     [place], please reschedule your appointments Come back for  for a physical exam in 6 months

## 2020-02-27 NOTE — Progress Notes (Signed)
Subjective:    Patient ID: [name], female    DOB: 1951/07/06, 69 y.o.   MRN: [id number]  DOS:  02/27/2020 Type of visit - description: Routine checkup Today we talk about high cholesterol, prediabetes. Note from cardiology reviewed. BP noted to be in the low side, she is asymptomatic   Review of Systems Denies chest pain or difficulty breathing.  No edema No dizziness or weakness today.  Past Medical History:  Diagnosis Date  . Breast CA ([place])    twice first on L breast 1982 w/ chest wall involvment, then a second breast ca on the R in 1987  . Chronic systolic CHF (congestive heart failure) ([place])    a. due to Adriamycin,s/p ICD;  b. 01/2013 Gen change and new LV lead - BSX [employer] CRT-D BiV ICD, Ser # [id number]  . Depression   . Diverticulitis   . Early menopause    early 24s  . Fatigue 02/20/2016  . Glaucoma suspect of both eyes    Dr. Marshall Cork  . Nonischemic cardiomyopathy ([place])   . OSA (obstructive sleep apnea) 02/07/2016  . Osteopenia   . Renal calculus   . Snoring 02/20/2016    Past Surgical History:  Procedure Laterality Date  . BI-VENTRICULAR IMPLANTABLE CARDIOVERTER DEFIBRILLATOR UPGRADE N/A 02/04/2013   Procedure: BI-VENTRICULAR IMPLANTABLE CARDIOVERTER DEFIBRILLATOR UPGRADE;  Surgeon: Evans Lance, MD;  Location: [facility] CATH LAB;  Service: Cardiovascular;  Laterality: N/A;  . CARDIAC DEFIBRILLATOR PLACEMENT     AICD Replaced-5/09 and 2014  . LEFT AND RIGHT HEART CATHETERIZATION WITH CORONARY ANGIOGRAM N/A 10/06/2013   Procedure: LEFT AND RIGHT HEART CATHETERIZATION WITH CORONARY ANGIOGRAM;  Surgeon: Jolaine Artist, MD;  Location: [facility] CATH LAB;  Service: Cardiovascular;  Laterality: N/A;  . LITHOTRIPSY     h/o several procedures   . MASTECTOMY Bilateral   . OOPHORECTOMY Bilateral   . triger finger, R ring finger Right 02/2019  . VENOGRAM N/A 10/20/2012   Procedure: VENOGRAM;  Surgeon: Deboraha Sprang, MD;  Location: [facility] CATH LAB;  Service:  Cardiovascular;  Laterality: N/A;    Allergies as of 02/27/2020      Reactions   Atacand [candesartan] Other (See Comments)   Causes fatigue   Cephalexin Hives, Itching   Latex Rash      Medication List       Accurate as of February 27, 2020  9:35 AM. If you have any questions, ask your nurse or doctor.        ascorbic acid 1000 MG tablet Commonly known as: VITAMIN C Take 1,000 mg by mouth daily.   atorvastatin 40 MG tablet Commonly known as: Lipitor Take 1 tablet (40 mg total) by mouth daily.   Biotin 5000 MCG Caps Take 5,000 mcg by mouth daily.   carvedilol 12.5 MG tablet Commonly known as: COREG Take 1 tablet in the morning and 1 tablet at night.   clopidogrel 75 MG tablet Commonly known as: PLAVIX Take 1 tablet (75 mg total) by mouth daily.   digoxin 0.125 MG tablet Commonly known as: LANOXIN Take 1 tablet (125 mcg total) by mouth daily.   Entresto 24-26 MG Generic drug: sacubitril-valsartan Take 1 tablet by mouth 2 (two) times daily.   furosemide 40 MG tablet Commonly known as: LASIX TAKE 2 TABLETS BY MOUTH  EVERY MORNING AND 1 TABLET  EVERY EVENING   gabapentin 300 MG capsule Commonly known as: NEURONTIN Take 300 mg by mouth daily as needed (for shingles flares).  multivitamin per tablet Take 1 tablet by mouth daily.   pantoprazole 40 MG tablet Commonly known as: PROTONIX Take 1 tablet (40 mg total) by mouth daily.   potassium chloride 10 MEQ tablet Commonly known as: KLOR-CON Take 3 tablets (30 mEq total) by mouth daily.   PROBIOTIC DAILY PO Take 1 capsule by mouth daily.   sertraline 25 MG tablet Commonly known as: ZOLOFT Take 1 tablet (25 mg total) by mouth daily.   spironolactone 25 MG tablet Commonly known as: ALDACTONE Take 1 tablet (25 mg total) by mouth daily.   vitamin B-12 1000 MCG tablet Commonly known as: CYANOCOBALAMIN Take 1,000 mcg by mouth daily.   zolpidem 10 MG tablet Commonly known as: AMBIEN TAKE ONE TABLET BY MOUTH  AT BEDTIME AS NEEDED FOR SLEEP          Objective:   Physical Exam BP (!) 98/34 (BP Location: Left Arm, Patient Position: Sitting, Cuff Size: Normal)   Pulse 60   Temp (!) 96.5 F (35.8 C) (Temporal)   Resp 16   Ht 5\' 4"  (1.626 m)   Wt 180 lb 8 oz (81.9 kg)   SpO2 99%   BMI 30.98 kg/m  General:   Well developed, NAD, BMI noted. HEENT:  Normocephalic . Face symmetric, atraumatic Lungs:  CTA B Normal respiratory effort, no intercostal retractions, no accessory muscle use. Heart: RRR, noticeable systolic murmur.  Lower extremities: no pretibial edema bilaterally  Skin: Not pale. Not jaundice Neurologic:  alert & oriented X3.  Speech normal, gait appropriate for age and unassisted Psych--  Cognition and judgment appear intact.  Cooperative with normal attention span and concentration.  Behavior appropriate. No anxious or depressed appearing.      Assessment     Assessment Hyperglycemia A1c 5.9 (04/2017) CV:Dr. Caryl Comes and [place] --CHF, nonischemic cardiomyopathy:due to chemotherapy --MR severe --VT -TIA 05-2017 Depression Mild OSA per sleep study 11-2015, saw Dr Radford Pax 02-20-16: no need for CPAP Osteopenia-- DEXA 06/13/2015 normal Vitamin D deficiency Kidney stones Menopause Breast cancer x 2: first in 1982 with chest wall involvement, 2nd on the R dx  1987, s/p B mastectomy; + BRACA Handicap sticker signed 06-2016 Post herpetic neuralgia (shingles 2014) , R face-- gaba prn  PLAN: Hyperglycemia: Diet controlled, check A1c High cholesterol: On Lipitor, labs Depression: Had a couple deaths in the family, obviously affected but is doing okay, PHQ-9:  6.  On sertraline.  No change Insomnia: RF Ambien.  She takes either 5 or 10 mg, as needed at night, encouraged to stay on 5 mg nightly as frequently as possible. CHF, severe MR, history of VT: Saw Dr. Caryl Comes 02/14/2020, was noted to be euvolemic with no intercurrent ventricular tachycardia Preventive care: Had 2 Covid  shot RTC 6 months CPX   This visit occurred during the SARS-CoV-2 public health emergency.  Safety protocols were in place, including screening questions prior to the visit, additional usage of staff PPE, and extensive cleaning of exam room while observing appropriate contact time as indicated for disinfecting solutions.

## 2020-02-27 NOTE — Progress Notes (Signed)
Pre visit review using our clinic review tool, if applicable. No additional management support is needed unless otherwise documented below in the visit note. 

## 2020-02-28 NOTE — Assessment & Plan Note (Signed)
Hyperglycemia: Diet controlled, check A1c High cholesterol: On Lipitor, labs Depression: Had a couple deaths in the family, obviously affected but is doing okay, PHQ-9:  6.  On sertraline.  No change Insomnia: RF Ambien.  She takes either 5 or 10 mg, as needed at night, encouraged to stay on 5 mg nightly as frequently as possible. CHF, severe MR, history of VT: Saw Dr. Caryl Comes 02/14/2020, was noted to be euvolemic with no intercurrent ventricular tachycardia Preventive care: Had 2 Covid shot RTC 6 months CPX

## 2020-03-08 DIAGNOSIS — H00025 Hordeolum internum left lower eyelid: Secondary | ICD-10-CM | POA: Diagnosis not present

## 2020-03-09 ENCOUNTER — Ambulatory Visit (INDEPENDENT_AMBULATORY_CARE_PROVIDER_SITE_OTHER): Payer: Medicare Other | Admitting: *Deleted

## 2020-03-09 DIAGNOSIS — Z95810 Presence of automatic (implantable) cardiac defibrillator: Secondary | ICD-10-CM

## 2020-03-09 LAB — CUP PACEART REMOTE DEVICE CHECK
Battery Remaining Longevity: 30 mo
Battery Remaining Percentage: 45 %
Brady Statistic RA Percent Paced: 0 %
Brady Statistic RV Percent Paced: 100 %
Date Time Interrogation Session: 20210416014200
HighPow Impedance: 65 Ohm
Implantable Lead Implant Date: 20050701
Implantable Lead Implant Date: 20050701
Implantable Lead Implant Date: 20140314
Implantable Lead Location: 753858
Implantable Lead Location: 753859
Implantable Lead Location: 753860
Implantable Lead Model: 158
Implantable Lead Model: 4196
Implantable Lead Model: 5076
Implantable Lead Serial Number: 156416
Implantable Pulse Generator Implant Date: 20140314
Lead Channel Impedance Value: 487 Ohm
Lead Channel Impedance Value: 608 Ohm
Lead Channel Impedance Value: 903 Ohm
Lead Channel Pacing Threshold Amplitude: 0.7 V
Lead Channel Pacing Threshold Amplitude: 0.7 V
Lead Channel Pacing Threshold Amplitude: 2.2 V
Lead Channel Pacing Threshold Pulse Width: 0.4 ms
Lead Channel Pacing Threshold Pulse Width: 0.6 ms
Lead Channel Pacing Threshold Pulse Width: 1.2 ms
Lead Channel Setting Pacing Amplitude: 2 V
Lead Channel Setting Pacing Amplitude: 2 V
Lead Channel Setting Pacing Amplitude: 3 V
Lead Channel Setting Pacing Pulse Width: 0.6 ms
Lead Channel Setting Pacing Pulse Width: 1 ms
Lead Channel Setting Sensing Sensitivity: 0.5 mV
Lead Channel Setting Sensing Sensitivity: 1 mV
Pulse Gen Serial Number: 111235

## 2020-03-09 NOTE — Progress Notes (Signed)
ICD Remote  

## 2020-04-09 ENCOUNTER — Other Ambulatory Visit: Payer: Self-pay

## 2020-04-09 ENCOUNTER — Encounter (HOSPITAL_COMMUNITY): Payer: Self-pay | Admitting: Internal Medicine

## 2020-04-09 ENCOUNTER — Ambulatory Visit (HOSPITAL_COMMUNITY)
Admission: RE | Admit: 2020-04-09 | Discharge: 2020-04-09 | Disposition: A | Discharge status: Home / Self Care | Payer: Medicare Other | Source: Ambulatory Visit | Attending: Internal Medicine | Admitting: Internal Medicine

## 2020-04-09 VITALS — BP 114/58 | HR 69 | Wt 177.4 lb

## 2020-04-09 DIAGNOSIS — Z8673 Personal history of transient ischemic attack (TIA), and cerebral infarction without residual deficits: Secondary | ICD-10-CM | POA: Insufficient documentation

## 2020-04-09 DIAGNOSIS — Z79899 Other long term (current) drug therapy: Secondary | ICD-10-CM | POA: Insufficient documentation

## 2020-04-09 DIAGNOSIS — Z7902 Long term (current) use of antithrombotics/antiplatelets: Secondary | ICD-10-CM | POA: Diagnosis not present

## 2020-04-09 DIAGNOSIS — Z9013 Acquired absence of bilateral breasts and nipples: Secondary | ICD-10-CM | POA: Diagnosis not present

## 2020-04-09 DIAGNOSIS — I5022 Chronic systolic (congestive) heart failure: Secondary | ICD-10-CM | POA: Diagnosis not present

## 2020-04-09 DIAGNOSIS — G4733 Obstructive sleep apnea (adult) (pediatric): Secondary | ICD-10-CM | POA: Diagnosis not present

## 2020-04-09 DIAGNOSIS — Z9221 Personal history of antineoplastic chemotherapy: Secondary | ICD-10-CM | POA: Insufficient documentation

## 2020-04-09 DIAGNOSIS — F329 Major depressive disorder, single episode, unspecified: Secondary | ICD-10-CM | POA: Diagnosis not present

## 2020-04-09 DIAGNOSIS — Z881 Allergy status to other antibiotic agents status: Secondary | ICD-10-CM | POA: Insufficient documentation

## 2020-04-09 DIAGNOSIS — Z853 Personal history of malignant neoplasm of breast: Secondary | ICD-10-CM | POA: Insufficient documentation

## 2020-04-09 DIAGNOSIS — I428 Other cardiomyopathies: Secondary | ICD-10-CM | POA: Insufficient documentation

## 2020-04-09 DIAGNOSIS — Z90721 Acquired absence of ovaries, unilateral: Secondary | ICD-10-CM | POA: Insufficient documentation

## 2020-04-09 DIAGNOSIS — Z8249 Family history of ischemic heart disease and other diseases of the circulatory system: Secondary | ICD-10-CM | POA: Insufficient documentation

## 2020-04-09 DIAGNOSIS — Z87442 Personal history of urinary calculi: Secondary | ICD-10-CM | POA: Diagnosis not present

## 2020-04-09 DIAGNOSIS — Z95810 Presence of automatic (implantable) cardiac defibrillator: Secondary | ICD-10-CM | POA: Insufficient documentation

## 2020-04-09 LAB — DIGOXIN LEVEL: Digoxin Level: 0.5 ng/mL — ABNORMAL LOW (ref 0.8–2.0)

## 2020-04-09 LAB — BASIC METABOLIC PANEL
Anion gap: 13 (ref 5–15)
BUN: 8 mg/dL (ref 8–23)
CO2: 27 mmol/L (ref 22–32)
Calcium: 9.4 mg/dL (ref 8.9–10.3)
Chloride: 101 mmol/L (ref 98–111)
Creatinine, Ser: 0.84 mg/dL (ref 0.44–1.00)
GFR calc Af Amer: >60 mL/min (ref >60)
GFR calc non Af Amer: >60 mL/min (ref >60)
Glucose, Bld: 111 mg/dL — ABNORMAL HIGH (ref 70–99)
Potassium: 4.2 mmol/L (ref 3.5–5.1)
Sodium: 141 mmol/L (ref 135–145)

## 2020-04-09 LAB — BRAIN NATRIURETIC PEPTIDE: B Natriuretic Peptide: 120.8 pg/mL — ABNORMAL HIGH (ref 0.0–100.0)

## 2020-04-09 NOTE — Progress Notes (Signed)
ADVANCED HEART FAILURE CLINIC NOTE  Patient ID: [name], female   DOB: [birth year], 69 y.o.   MRN: [id number] PCP: Dr Larose Kells EP: Dr Caryl Comes  Subjective  [name] is a 69 y/o woman with h/o chronic systolic presumed due to chemotherapy-related cardiomyopathy diagnosed with breast cancer in 1982 in [place]. Treated with chemo (including adriamycin)/XRT and L mastectomy. Had R breast cancer (unrelated) in 1987. Has been cancer free since. Denies HTN, HL, DM2.  Was first diagnosed with HF in 1988. Had left heart cath she thinks in 2009. 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 Echo 2009 EF 20% with moderate AI and mild to moderate MR. Had [place] ICD placed and then LV lead became nonfunctional. In 3/14, underwent CRT revision.   Previously on coumadin due to cardiomyopathy but discontinued. Had sleep study 1/17 very mild OSA (6.2/hr) not using CPAP.   Admitted in 6/18 withTIA. Unable to use right hand and speech slurred. CT normal. Unable to do MRI.   She returns today for regular follow up. Doing pretty well. Things are about the same. Able to do all activities as long as she takes her time. No edema, orthopnea or PND. BP runs low but asymptomatic. SBP usually 100-105 but can go into the 90s.   Echo 12/19 EF 25% RV ok   ICD interrogated personally: No VT/VF No AF.  Activity level 2.3hr/day Personally reviewed  ECHO 09/2013 with Dr. Wynonia Lawman and EF 10-15% with significant dyssynchrony. Subsequently had LV lead checked and was functioning well.  ECHO 7/15 EF 10-15%, moderate to severe RV dysfunction ECHO 4/16 EF 15% normal RV moderate AI ECHO 9/17 EF 20-25% RV ok. Severe MR mild AI/AS Echo 6/18 EF 20-25% RV ok Mild to moderate AI   CPX 2/19 FVC 3.41 (117%)    FEV1 2.45 (108%)     FEV1/FVC 72 (92%)     MVV 78 (89%)    Resting HR: 74 Peak HR: 121  (80% age predicted max HR) BP rest: 108/56 BP peak: 136/58 Peak VO2: 18.3 (103% predicted  peak VO2) VE/VCO2 slope: 30 OUES: 2.45 Peak RER: 0.95  CPX 10/17  FVC 3.44 (113%)    FEV1 2.32 (97%)     FEV1/FVC 67 (86%)     MVV 90 (101%)    Resting HR: 72 Peak HR: 112 (73% age predicted max HR)  BP rest: 104/64 BP peak: 146/64 Peak VO2: 16.0 (91% predicted peak VO2) VE/VCO2 slope: 34.6 OUES: 1.90 Peak RER: 1.10 Ventilatory Threshold: 13.5 (76.9% predicted or measured peak VO2) VE/MVV: 64% PETCO2 at peak: 29 O2pulse: 13  (130% predicted O2pulse)  LHC/RHC 10/06/13  RA = 11  RV = 54/9/13  PA = 53/27 (38)  PCW = 23  Fick cardiac output/index = 3.8/2.0  Thermo CO/CI = 3.4/1.8  PVR = 3.9 WU (Fick)  FA sat = 98%  PA sat = 62%, 64%  SVC = 59%  Coronaries no significant disease but there was a fistula from small D1 => PA.   CPX 10/25/13  Peak VO2: 15.6 (81.2% predicted peak VO2) VE/VCO2 slope: 36.9 OUES: 1.25 Peak RER: 1.08  CPX 05/24/14 FVC 3.05 (92%)  FEV1 2.23 (88%)  FEV1/FVC 73%  MVV 92 (100%) Resting HR: 64 Peak HR: 132 (85% age predicted max HR) BP rest: 108/70 BP peak: 130/66  Peak VO2: 16.4 (81.1% predicted peak VO2) VE/VCO2 slope: 33.6 OUES: 1.74 Peak RER: 1.16 Ventilatory Threshold: 12.5 (61.8% predicted peak VO2) VE/MVV: 62% PETCO2 at  peak: 31 O2pulse: 12 (109% predicted O2pulse)  Lab 10/06/13 K 2.8 Creatinine 0.83 Labs 10/31/13 K 3.0 Creatinine 0.68  Dig level 0.9 Pro BNP 3906  Labs 11/11/13  K 3.6 Creatinine 0.8 Labs 12/16/13 K 3.8 Creatinine 0.8  Labs 4/15 K 4, creatinine 0.8, LDL 128, TSH normal, digoxin 0.6 Labs 6/15 K 4.1 Cr 0.8 digoxin 0.7 Labs 8/15 K 4.2, creatinine 0.78, pBNP 2126 Labs 8/15 K 4.2, creatinine 1.0 dig <0.5  Blood type A+   FHX: Had older brother with HF in his 86s. No strong FHx of HF.   SocHx: Retired. Non-smoker. Rare ETOH. 3 grown kids.   Review of systems complete and found to be negative unless listed in HPI.    Past Medical History:  Diagnosis Date  . Breast CA ([place])    twice first on L  breast 1982 w/ chest wall involvment, then a second breast ca on the R in 1987  . Chronic systolic CHF (congestive heart failure) ([place])    a. due to Adriamycin,s/p ICD;  b. 01/2013 Gen change and new LV lead - BSX Energen CRT-D BiV ICD, Ser # [id number]  . Depression   . Diverticulitis   . Early menopause    early 47s  . Fatigue 02/20/2016  . Glaucoma suspect of both eyes    Dr. Marshall Cork  . Nonischemic cardiomyopathy ([place])   . OSA (obstructive sleep apnea) 02/07/2016  . Osteopenia   . Renal calculus   . Snoring 02/20/2016    Past Surgical History:  Procedure Laterality Date  . BI-VENTRICULAR IMPLANTABLE CARDIOVERTER DEFIBRILLATOR UPGRADE N/A 02/04/2013   Procedure: BI-VENTRICULAR IMPLANTABLE CARDIOVERTER DEFIBRILLATOR UPGRADE;  Surgeon: Evans Lance, MD;  Location: [facility] CATH LAB;  Service: Cardiovascular;  Laterality: N/A;  . CARDIAC DEFIBRILLATOR PLACEMENT     AICD Replaced-5/09 and 2014  . LEFT AND RIGHT HEART CATHETERIZATION WITH CORONARY ANGIOGRAM N/A 10/06/2013   Procedure: LEFT AND RIGHT HEART CATHETERIZATION WITH CORONARY ANGIOGRAM;  Surgeon: Jolaine Artist, MD;  Location: [facility] CATH LAB;  Service: Cardiovascular;  Laterality: N/A;  . LITHOTRIPSY     h/o several procedures   . MASTECTOMY Bilateral   . OOPHORECTOMY Bilateral   . triger finger, R ring finger Right 02/2019  . VENOGRAM N/A 10/20/2012   Procedure: VENOGRAM;  Surgeon: Deboraha Sprang, MD;  Location: [facility] CATH LAB;  Service: Cardiovascular;  Laterality: N/A;    Current Outpatient Medications  Medication Sig Dispense Refill  . ascorbic acid (VITAMIN C) 1000 MG tablet Take 1,000 mg by mouth daily.    Marland Kitchen atorvastatin (LIPITOR) 40 MG tablet Take 1 tablet (40 mg total) by mouth daily. 90 tablet 3  . Biotin 5000 MCG CAPS Take 5,000 mcg by mouth daily.     . carvedilol (COREG) 12.5 MG tablet Take 1 tablet in the morning and 1 tablet at night. 180 tablet 3  . clopidogrel (PLAVIX) 75 MG tablet Take 1 tablet (75 mg total)  by mouth daily. 90 tablet 3  . digoxin (LANOXIN) 0.125 MG tablet Take 1 tablet (125 mcg total) by mouth daily. 90 tablet 3  . furosemide (LASIX) 40 MG tablet TAKE 2 TABLETS BY MOUTH  EVERY MORNING AND 1 TABLET  EVERY EVENING 270 tablet 3  . gabapentin (NEURONTIN) 300 MG capsule Take 300 mg by mouth daily as needed (for shingles flares).     . multivitamin (THERAGRAN) per tablet Take 1 tablet by mouth daily.     . pantoprazole (PROTONIX) 40 MG tablet Take 1  tablet (40 mg total) by mouth daily. 90 tablet 3  . potassium chloride (K-DUR) 10 MEQ tablet Take 3 tablets (30 mEq total) by mouth daily. 270 tablet 3  . Probiotic Product (PROBIOTIC DAILY PO) Take 1 capsule by mouth daily.     . sacubitril-valsartan (ENTRESTO) 24-26 MG Take 1 tablet by mouth 2 (two) times daily. 60 tablet 11  . sertraline (ZOLOFT) 25 MG tablet Take 1 tablet (25 mg total) by mouth daily. 90 tablet 1  . spironolactone (ALDACTONE) 25 MG tablet Take 1 tablet (25 mg total) by mouth daily. 90 tablet 3  . vitamin B-12 (CYANOCOBALAMIN) 1000 MCG tablet Take 1,000 mcg by mouth daily.    Marland Kitchen zolpidem (AMBIEN) 10 MG tablet Take 0.5-1 tablets (5-10 mg total) by mouth at bedtime as needed. for sleep 90 tablet 1   No current facility-administered medications for this encounter.   Allergies  Allergen Reactions  . Atacand [Candesartan] Other (See Comments)    Causes fatigue   . Cephalexin Hives and Itching  . Latex Rash   Physical Exam  Vitals:   04/09/20 1405  BP: (!) 114/58  Pulse: 69  SpO2: 97%  Weight: 80.5 kg (177 lb 6.4 oz)   Wt Readings from Last 3 Encounters:  04/09/20 80.5 kg (177 lb 6.4 oz)  02/27/20 81.9 kg (180 lb 8 oz)  02/14/20 81.5 kg (179 lb 9.6 oz)   General:  Well appearing. No resp difficulty HEENT: normal Neck: supple. no JVD. Carotids 2+ bilat; no bruits. No lymphadenopathy or thryomegaly appreciated. Cor: PMI nondisplaced. Regular rate & rhythm. No rubs, gallops or murmurs. Lungs: clear Abdomen:  soft, nontender, nondistended. No hepatosplenomegaly. No bruits or masses. Good bowel sounds. Extremities: no cyanosis, clubbing, rash, edema Neuro: alert & orientedx3, cranial nerves grossly intact. moves all 4 extremities w/o difficulty. Affect pleasant   Assessment and  Plan  1) Chronic systolic HF:  - Nonischemic cardiomyopathy, possibly anthracycline cardiotoxicity.  LHC 11/14 without significant coronary disease.    Has [employer] CRT-D. ECHO 09/2013 EF 10-15%. Echo at [facility] 4/16 EF 15% moderate AI.  - Echo 9/17 EF 20-25%.  RV ok. Severe MR mild AI/AS  Seen in [facility] and will follow prn - Echo 04/2017 LVEF 20-25%, Grade 1 DD. - Echo 12/19 EF 20-25% RV ok - Stable NYHA II-III sypptoms - Volume stable on exam - ICD interrogated personally today. No VT/AF  - Continue lasix 80 mg q am and 40 mg q pm.  - Continue Entresto 24/26 mg BID, carvedilol 6.25/12.5 , digoxin, spironolactone 25 mg daily. Has not been able to tolerate higher doses due to low BP.  - We discussed addition of Farxiga today with weaning of lasix but she is concerned about cost. I will d/w PharmD - Has previously seen Dr. Stann Mainland at [facility] to do well despite markedly depressed LVEF. CPX 2/19  improved. She has aged out of transplant consideration and says she would not want an LVAD if she got worse. Continue to discuss 2) h/o bilateral breast CA in 1982 and 1987 (treated with Adriamycin in 1982) - stable 3) ICD  - follows with Dr. Caryl Comes. Interrogation today as above. No VT/VF 4) h/o TIA.  - Continue Plavix and statin. No recent Neuro symptoms   Glori Bickers, MD 04/09/2020

## 2020-04-09 NOTE — Patient Instructions (Signed)
Labs done today, your results will be available in MyChart, we will contact you for abnormal readings.  Your physician has requested that you have an echocardiogram. Echocardiography is a painless test that uses sound waves to create images of your heart. It provides your doctor with information about the size and shape of your heart and how well your heart's chambers and valves are working. This procedure takes approximately one hour. There are no restrictions for this procedure.  Please call our office in November to schedule your follow up appointment  If you have any questions or concerns before your next appointment please send [place] a message through [place] or call our office at [phone].  At the [facility], you and your health needs are our priority. As part of our continuing mission to provide you with exceptional heart care, we have created designated Provider Care Teams. These Care Teams include your primary Cardiologist (physician) and Advanced Practice Providers (APPs- Physician Assistants and Nurse Practitioners) who all work together to provide you with the care you need, when you need it.   You may see any of the following providers on your designated Care Team at your next follow up: Marland Kitchen Dr Glori Bickers . Dr Loralie Champagne . Darrick Grinder, NP . Lyda Jester, PA . Audry Riles, PharmD   Please be sure to bring in all your medications bottles to every appointment.

## 2020-04-25 ENCOUNTER — Ambulatory Visit (HOSPITAL_COMMUNITY): Payer: Medicare Other

## 2020-04-27 ENCOUNTER — Ambulatory Visit (HOSPITAL_COMMUNITY)
Admission: RE | Admit: 2020-04-27 | Discharge: 2020-04-27 | Disposition: A | Discharge status: Home / Self Care | Payer: Medicare Other | Source: Ambulatory Visit | Attending: Internal Medicine | Admitting: Internal Medicine

## 2020-04-27 ENCOUNTER — Other Ambulatory Visit: Payer: Self-pay

## 2020-04-27 DIAGNOSIS — Z95810 Presence of automatic (implantable) cardiac defibrillator: Secondary | ICD-10-CM | POA: Insufficient documentation

## 2020-04-27 DIAGNOSIS — I447 Left bundle-branch block, unspecified: Secondary | ICD-10-CM | POA: Diagnosis not present

## 2020-04-27 DIAGNOSIS — I5022 Chronic systolic (congestive) heart failure: Secondary | ICD-10-CM | POA: Insufficient documentation

## 2020-04-27 DIAGNOSIS — I080 Rheumatic disorders of both mitral and aortic valves: Secondary | ICD-10-CM | POA: Diagnosis not present

## 2020-04-27 DIAGNOSIS — Z9882 Breast implant status: Secondary | ICD-10-CM | POA: Diagnosis not present

## 2020-04-27 NOTE — Progress Notes (Signed)
  Echocardiogram 2D Echocardiogram has been performed.  [name] 04/27/2020, 2:53 PM

## 2020-04-30 ENCOUNTER — Other Ambulatory Visit (HOSPITAL_COMMUNITY): Payer: Self-pay | Admitting: Internal Medicine

## 2020-06-04 ENCOUNTER — Ambulatory Visit: Payer: Medicare Other | Admitting: *Deleted

## 2020-06-08 ENCOUNTER — Ambulatory Visit (INDEPENDENT_AMBULATORY_CARE_PROVIDER_SITE_OTHER): Payer: Medicare Other | Admitting: *Deleted

## 2020-06-08 DIAGNOSIS — I428 Other cardiomyopathies: Secondary | ICD-10-CM

## 2020-06-08 DIAGNOSIS — I5022 Chronic systolic (congestive) heart failure: Secondary | ICD-10-CM

## 2020-06-08 LAB — CUP PACEART REMOTE DEVICE CHECK
Battery Remaining Longevity: 24 mo
Battery Remaining Percentage: 41 %
Brady Statistic RA Percent Paced: 0 %
Brady Statistic RV Percent Paced: 100 %
Date Time Interrogation Session: 20210716001200
HighPow Impedance: 53 Ohm
Implantable Lead Implant Date: 20050701
Implantable Lead Implant Date: 20050701
Implantable Lead Implant Date: 20140314
Implantable Lead Location: 753858
Implantable Lead Location: 753859
Implantable Lead Location: 753860
Implantable Lead Model: 158
Implantable Lead Model: 4196
Implantable Lead Model: 5076
Implantable Lead Serial Number: 156416
Implantable Pulse Generator Implant Date: 20140314
Lead Channel Impedance Value: 479 Ohm
Lead Channel Impedance Value: 514 Ohm
Lead Channel Impedance Value: 714 Ohm
Lead Channel Pacing Threshold Amplitude: 0.7 V
Lead Channel Pacing Threshold Amplitude: 0.7 V
Lead Channel Pacing Threshold Amplitude: 2.2 V
Lead Channel Pacing Threshold Pulse Width: 0.4 ms
Lead Channel Pacing Threshold Pulse Width: 0.6 ms
Lead Channel Pacing Threshold Pulse Width: 1.2 ms
Lead Channel Setting Pacing Amplitude: 2 V
Lead Channel Setting Pacing Amplitude: 2 V
Lead Channel Setting Pacing Amplitude: 3 V
Lead Channel Setting Pacing Pulse Width: 0.6 ms
Lead Channel Setting Pacing Pulse Width: 1 ms
Lead Channel Setting Sensing Sensitivity: 0.5 mV
Lead Channel Setting Sensing Sensitivity: 1 mV
Pulse Gen Serial Number: 111235

## 2020-06-11 NOTE — Progress Notes (Signed)
Remote ICD transmission.   

## 2020-07-04 DIAGNOSIS — Z85828 Personal history of other malignant neoplasm of skin: Secondary | ICD-10-CM | POA: Diagnosis not present

## 2020-07-04 DIAGNOSIS — L905 Scar conditions and fibrosis of skin: Secondary | ICD-10-CM | POA: Diagnosis not present

## 2020-07-04 DIAGNOSIS — C44629 Squamous cell carcinoma of skin of left upper limb, including shoulder: Secondary | ICD-10-CM | POA: Diagnosis not present

## 2020-07-04 DIAGNOSIS — D485 Neoplasm of uncertain behavior of skin: Secondary | ICD-10-CM | POA: Diagnosis not present

## 2020-07-07 ENCOUNTER — Other Ambulatory Visit (HOSPITAL_COMMUNITY): Payer: Self-pay | Admitting: Internal Medicine

## 2020-07-19 DIAGNOSIS — C44629 Squamous cell carcinoma of skin of left upper limb, including shoulder: Secondary | ICD-10-CM | POA: Diagnosis not present

## 2020-07-27 ENCOUNTER — Other Ambulatory Visit: Payer: Self-pay | Admitting: Internal Medicine

## 2020-07-30 ENCOUNTER — Other Ambulatory Visit: Payer: Self-pay | Admitting: Internal Medicine

## 2020-07-30 ENCOUNTER — Other Ambulatory Visit (HOSPITAL_COMMUNITY): Payer: Self-pay | Admitting: Internal Medicine

## 2020-08-28 ENCOUNTER — Other Ambulatory Visit: Payer: Self-pay

## 2020-08-28 ENCOUNTER — Ambulatory Visit (INDEPENDENT_AMBULATORY_CARE_PROVIDER_SITE_OTHER): Payer: Medicare Other | Admitting: Internal Medicine

## 2020-08-28 ENCOUNTER — Encounter: Payer: Self-pay | Admitting: Internal Medicine

## 2020-08-28 VITALS — BP 107/66 | HR 63 | Temp 97.5°F | Resp 16 | Ht 64.0 in | Wt 180.0 lb

## 2020-08-28 DIAGNOSIS — R1012 Left upper quadrant pain: Secondary | ICD-10-CM

## 2020-08-28 DIAGNOSIS — Z0000 Encounter for general adult medical examination without abnormal findings: Secondary | ICD-10-CM | POA: Diagnosis not present

## 2020-08-28 DIAGNOSIS — I428 Other cardiomyopathies: Secondary | ICD-10-CM | POA: Diagnosis not present

## 2020-08-28 DIAGNOSIS — R739 Hyperglycemia, unspecified: Secondary | ICD-10-CM

## 2020-08-28 NOTE — Progress Notes (Signed)
Subjective:    Patient ID: [name], female    DOB: 27-Nov-1950, 69 y.o.   MRN: [id number]  DOS:  08/28/2020 Type of visit - description: cpx  Since the last office visit she is doing well. Note from cardiology reviewed Reports discomfort and a left upper quadrant for the last 2 months, it happens daily, few times a day, is described as a movement or pulsatile sensation not related to BMs, nausea, heartburn or a rash. She denies chest pain or difficulty breathing. No blood in the stools   Review of Systems  Other than above, a 14 point review of systems is negative      Past Medical History:  Diagnosis Date  . Breast CA ([place])    twice first on L breast 1982 w/ chest wall involvment, then a second breast ca on the R in 1987  . Chronic systolic CHF (congestive heart failure) ([place])    a. due to Adriamycin,s/p ICD;  b. 01/2013 Gen change and new LV lead - BSX Energen CRT-D BiV ICD, Ser # [id number]  . Depression   . Diverticulitis   . Early menopause    early 26s  . Fatigue 02/20/2016  . Glaucoma suspect of both eyes    Dr. Marshall Cork  . Nonischemic cardiomyopathy ([place])   . OSA (obstructive sleep apnea) 02/07/2016  . Osteopenia   . Renal calculus   . Snoring 02/20/2016    Past Surgical History:  Procedure Laterality Date  . BI-VENTRICULAR IMPLANTABLE CARDIOVERTER DEFIBRILLATOR UPGRADE N/A 02/04/2013   Procedure: BI-VENTRICULAR IMPLANTABLE CARDIOVERTER DEFIBRILLATOR UPGRADE;  Surgeon: Evans Lance, MD;  Location: [facility] CATH LAB;  Service: Cardiovascular;  Laterality: N/A;  . CARDIAC DEFIBRILLATOR PLACEMENT     AICD Replaced-5/09 and 2014  . LEFT AND RIGHT HEART CATHETERIZATION WITH CORONARY ANGIOGRAM N/A 10/06/2013   Procedure: LEFT AND RIGHT HEART CATHETERIZATION WITH CORONARY ANGIOGRAM;  Surgeon: Jolaine Artist, MD;  Location: [facility] CATH LAB;  Service: Cardiovascular;  Laterality: N/A;  . LITHOTRIPSY     h/o several procedures   . MASTECTOMY Bilateral   .  OOPHORECTOMY Bilateral   . triger finger, R ring finger Right 02/2019  . VENOGRAM N/A 10/20/2012   Procedure: VENOGRAM;  Surgeon: Deboraha Sprang, MD;  Location: [facility] CATH LAB;  Service: Cardiovascular;  Laterality: N/A;    Allergies as of 08/28/2020      Reactions   Atacand [candesartan] Other (See Comments)   Causes fatigue   Cephalexin Hives, Itching   Latex Rash      Medication List       Accurate as of August 28, 2020 11:59 PM. If you have any questions, ask your nurse or doctor.        ascorbic acid 1000 MG tablet Commonly known as: VITAMIN C Take 1,000 mg by mouth daily.   atorvastatin 40 MG tablet Commonly known as: LIPITOR Take 1 tablet (40 mg total) by mouth daily.   Biotin 5000 MCG Caps Take 5,000 mcg by mouth daily.   carvedilol 12.5 MG tablet Commonly known as: COREG TAKE ONE TABLET BY MOUTH IN THE MORNING AND AT NIGHT   clopidogrel 75 MG tablet Commonly known as: PLAVIX TAKE ONE TABLET BY MOUTH ONE TIME DAILY   digoxin 0.125 MG tablet Commonly known as: LANOXIN TAKE ONE TABLET BY MOUTH ONE TIME DAILY   Entresto 24-26 MG Generic drug: sacubitril-valsartan Take 1 tablet by mouth 2 (two) times daily.   furosemide 40 MG tablet Commonly known as:  LASIX TAKE 2 TABLETS BY MOUTH  EVERY MORNING AND 1 TABLET  EVERY EVENING   gabapentin 300 MG capsule Commonly known as: NEURONTIN Take 300 mg by mouth daily as needed (for shingles flares).   multivitamin per tablet Take 1 tablet by mouth daily.   pantoprazole 40 MG tablet Commonly known as: PROTONIX Take 1 tablet (40 mg total) by mouth daily.   potassium chloride 10 MEQ tablet Commonly known as: KLOR-CON Take 3 tablets (30 mEq total) by mouth daily.   PROBIOTIC DAILY PO Take 1 capsule by mouth daily.   sertraline 25 MG tablet Commonly known as: ZOLOFT Take 1 tablet (25 mg total) by mouth daily.   spironolactone 25 MG tablet Commonly known as: ALDACTONE TAKE ONE TABLET BY MOUTH ONE TIME DAILY    vitamin B-12 1000 MCG tablet Commonly known as: CYANOCOBALAMIN Take 1,000 mcg by mouth daily.   zolpidem 10 MG tablet Commonly known as: AMBIEN Take 0.5-1 tablets (5-10 mg total) by mouth at bedtime as needed. for sleep          Objective:   Physical Exam BP 107/66 (BP Location: Right Arm, Patient Position: Sitting, Cuff Size: Small)   Pulse 63   Temp (!) 97.5 F (36.4 C) (Oral)   Resp 16   Ht 5\' 4"  (1.626 m)   Wt 180 lb (81.6 kg)   SpO2 97%   BMI 30.90 kg/m  General: Well developed, NAD, BMI noted Neck: No  thyromegaly  HEENT:  Normocephalic . Face symmetric, atraumatic Lungs:  CTA B Normal respiratory effort, no intercostal retractions, no accessory muscle use. Heart: RRR, + systolic murmur.  Abdomen:  Not distended, soft, non-tender. No rebound or rigidity.  No organomegaly. Lower extremities: no pretibial edema bilaterally  Skin: Exposed areas without rash. Not pale. Not jaundice Neurologic:  alert & oriented X3.  Speech normal, gait appropriate for age and unassisted Strength symmetric and appropriate for age.  Psych: Cognition and judgment appear intact.  Cooperative with normal attention span and concentration.  Behavior appropriate. No anxious or depressed appearing.     Assessment     Assessment Hyperglycemia A1c 5.9 (04/2017) CV:Dr. Caryl Comes and Bensimohn --CHF, nonischemic cardiomyopathy:due to chemotherapy --MR severe --VT -TIA 05-2017 Depression Mild OSA per sleep study 11-2015, saw Dr Radford Pax 02-20-16: no need for CPAP Osteopenia-- DEXA 06/13/2015 normal Vitamin D deficiency Kidney stones Menopause Breast cancer x 2: first in 1982 with chest wall involvement, 2nd on the R dx  1987, s/p B mastectomy; + BRACA Handicap sticker signed 06-2016 Post herpetic neuralgia (shingles 2014) , R face-- gaba prn  PLAN: Here for CPX  Hyperglycemia: Check an A1c Cardiomyopathy, mitral regurgitation: saw cardiology 04/09/2020, aged out of transplant  consideration, felt to be stable.  EF around 40%. Check a CMP, CBC Hyperlipidemia: Last cholesterol panel very good. L UQ abdominal discomfort: Atypical sxs, recommend ultrasound, rule out splenomegaly. RTC 6 to 8 months   This visit occurred during the SARS-CoV-2 public health emergency.  Safety protocols were in place, including screening questions prior to the visit, additional usage of staff PPE, and extensive cleaning of exam room while observing appropriate contact time as indicated for disinfecting solutions.

## 2020-08-28 NOTE — Patient Instructions (Addendum)
Check the  blood pressure weekly BP GOAL is between 110/65 and  135/85. If it is consistently higher or lower, let me know  Proceed with the high-dose flu shot and Covid vaccination booster   GO TO THE LAB : Get the blood work     [place], [place] back for a checkup in 6 to 8 months  STOP BY THE FIRST FLOOR: Schedule the ultrasound

## 2020-08-28 NOTE — Progress Notes (Signed)
Pre visit review using our clinic review tool, if applicable. No additional management support is needed unless otherwise documented below in the visit note. 

## 2020-08-29 ENCOUNTER — Ambulatory Visit (HOSPITAL_BASED_OUTPATIENT_CLINIC_OR_DEPARTMENT_OTHER)
Admission: RE | Admit: 2020-08-29 | Discharge: 2020-08-29 | Disposition: A | Discharge status: Home / Self Care | Payer: Medicare Other | Source: Ambulatory Visit | Attending: Internal Medicine | Admitting: Internal Medicine

## 2020-08-29 ENCOUNTER — Encounter: Payer: Self-pay | Admitting: Internal Medicine

## 2020-08-29 DIAGNOSIS — R161 Splenomegaly, not elsewhere classified: Secondary | ICD-10-CM | POA: Diagnosis not present

## 2020-08-29 DIAGNOSIS — R1012 Left upper quadrant pain: Secondary | ICD-10-CM | POA: Insufficient documentation

## 2020-08-29 NOTE — Assessment & Plan Note (Signed)
Here for CPX  Hyperglycemia: Check an A1c Cardiomyopathy, mitral regurgitation: saw cardiology 04/09/2020, aged out of transplant consideration, felt to be stable.  EF around 40%. Check a CMP, CBC Hyperlipidemia: Last cholesterol panel very good. L UQ abdominal discomfort: Atypical sxs, recommend ultrasound, rule out splenomegaly. RTC 6 to 8 months

## 2020-08-29 NOTE — Assessment & Plan Note (Signed)
-  Td 2013 -  pnm 23:  2017;  prevnar:2015 - zostavax 2013 - shingrix: d/w pt 2020, encouraged to proceed at her convenience - s/p covid vacc 01/2020, booster rec - flu shot plans to get  at the drug store   - [place] 2003, and 11-2011, next 10 years (Dr Deatra Ina) -Female care: (-) 10-2017, PAP. No further cervical ca screening  MMG-- h/o mastectomy , self chest examination normal DEXA 2016, DEXA 12/2019: Tscore  (-)  1.1 -Labs reviewed, check a CMP,CBC, A1c, TSH. -She remains active and is trying to eat healthy

## 2020-08-30 LAB — CBC WITH DIFFERENTIAL/PLATELET
Absolute Monocytes: 716 cells/uL (ref 200–950)
Basophils Absolute: 58 cells/uL (ref 0–200)
Basophils Relative: 1.1 %
Eosinophils Absolute: 254 cells/uL (ref 15–500)
Eosinophils Relative: 4.8 %
HCT: 39.3 % (ref 35.0–45.0)
Hemoglobin: 13.1 g/dL (ref 11.7–15.5)
Lymphs Abs: 1797 cells/uL (ref 850–3900)
MCH: 30.8 pg (ref 27.0–33.0)
MCHC: 33.3 g/dL (ref 32.0–36.0)
MCV: 92.5 fL (ref 80.0–100.0)
MPV: 9.7 fL (ref 7.5–12.5)
Monocytes Relative: 13.5 %
Neutro Abs: 2475 cells/uL (ref 1500–7800)
Neutrophils Relative %: 46.7 %
Platelets: 262 10*3/uL (ref 140–400)
RBC: 4.25 10*6/uL (ref 3.80–5.10)
RDW: 12.2 % (ref 11.0–15.0)
Total Lymphocyte: 33.9 %
WBC: 5.3 10*3/uL (ref 3.8–10.8)

## 2020-08-30 LAB — COMPREHENSIVE METABOLIC PANEL
AG Ratio: 1.6 (calc) (ref 1.0–2.5)
ALT: 22 U/L (ref 6–29)
AST: 20 U/L (ref 10–35)
Albumin: 4.1 g/dL (ref 3.6–5.1)
Alkaline phosphatase (APISO): 94 U/L (ref 37–153)
BUN: 9 mg/dL (ref 7–25)
CO2: 29 mmol/L (ref 20–32)
Calcium: 9.3 mg/dL (ref 8.6–10.4)
Chloride: 105 mmol/L (ref 98–110)
Creat: 0.79 mg/dL (ref 0.60–0.93)
Globulin: 2.5 g/dL (calc) (ref 1.9–3.7)
Glucose, Bld: 108 mg/dL — ABNORMAL HIGH (ref 65–99)
Potassium: 4.3 mmol/L (ref 3.5–5.3)
Sodium: 141 mmol/L (ref 135–146)
Total Bilirubin: 0.3 mg/dL (ref 0.2–1.2)
Total Protein: 6.6 g/dL (ref 6.1–8.1)

## 2020-08-30 LAB — TSH: TSH: 1.35 mIU/L (ref 0.40–4.50)

## 2020-08-30 LAB — HEMOGLOBIN A1C
Hgb A1c MFr Bld: 5.9 % of total Hgb — ABNORMAL HIGH (ref <5.7)
Mean Plasma Glucose: 123 (calc)
eAG (mmol/L): 6.8 (calc)

## 2020-09-07 ENCOUNTER — Ambulatory Visit (INDEPENDENT_AMBULATORY_CARE_PROVIDER_SITE_OTHER): Payer: Medicare Other

## 2020-09-07 DIAGNOSIS — I5022 Chronic systolic (congestive) heart failure: Secondary | ICD-10-CM

## 2020-09-07 DIAGNOSIS — I428 Other cardiomyopathies: Secondary | ICD-10-CM | POA: Diagnosis not present

## 2020-09-08 LAB — CUP PACEART REMOTE DEVICE CHECK
Battery Remaining Longevity: 24 mo
Battery Remaining Percentage: 38 %
Brady Statistic RA Percent Paced: 0 %
Brady Statistic RV Percent Paced: 100 %
Date Time Interrogation Session: 20211015002400
HighPow Impedance: 57 Ohm
Implantable Lead Implant Date: 20050701
Implantable Lead Implant Date: 20050701
Implantable Lead Implant Date: 20140314
Implantable Lead Location: 753858
Implantable Lead Location: 753859
Implantable Lead Location: 753860
Implantable Lead Model: 158
Implantable Lead Model: 4196
Implantable Lead Model: 5076
Implantable Lead Serial Number: 156416
Implantable Pulse Generator Implant Date: 20140314
Lead Channel Impedance Value: 462 Ohm
Lead Channel Impedance Value: 541 Ohm
Lead Channel Impedance Value: 733 Ohm
Lead Channel Pacing Threshold Amplitude: 0.7 V
Lead Channel Pacing Threshold Amplitude: 0.7 V
Lead Channel Pacing Threshold Amplitude: 2.2 V
Lead Channel Pacing Threshold Pulse Width: 0.4 ms
Lead Channel Pacing Threshold Pulse Width: 0.6 ms
Lead Channel Pacing Threshold Pulse Width: 1.2 ms
Lead Channel Setting Pacing Amplitude: 2 V
Lead Channel Setting Pacing Amplitude: 2 V
Lead Channel Setting Pacing Amplitude: 3 V
Lead Channel Setting Pacing Pulse Width: 0.6 ms
Lead Channel Setting Pacing Pulse Width: 1 ms
Lead Channel Setting Sensing Sensitivity: 0.5 mV
Lead Channel Setting Sensing Sensitivity: 1 mV
Pulse Gen Serial Number: 111235

## 2020-09-12 NOTE — Progress Notes (Signed)
Remote ICD transmission.   

## 2020-09-26 ENCOUNTER — Other Ambulatory Visit (HOSPITAL_COMMUNITY): Payer: Self-pay

## 2020-09-26 MED ORDER — ENTRESTO 24-26 MG PO TABS
1.0000 | ORAL_TABLET | Freq: Two times a day (BID) | ORAL | 11 refills | Status: DC
Start: 2020-09-26 — End: 2021-08-20

## 2020-10-02 ENCOUNTER — Other Ambulatory Visit: Payer: Self-pay | Admitting: Internal Medicine

## 2020-10-02 ENCOUNTER — Other Ambulatory Visit (HOSPITAL_COMMUNITY): Payer: Self-pay | Admitting: Internal Medicine

## 2020-10-22 ENCOUNTER — Other Ambulatory Visit (HOSPITAL_COMMUNITY): Payer: Self-pay | Admitting: Internal Medicine

## 2020-10-30 ENCOUNTER — Telehealth: Payer: Self-pay | Admitting: Internal Medicine

## 2020-10-31 NOTE — Telephone Encounter (Signed)
PDMP ok, rx sent  

## 2020-10-31 NOTE — Telephone Encounter (Signed)
Requesting: Ambien 10mg  Contract: None UDS: None (Ambien only) Last Visit: 08/28/2020 Next Visit: 02/26/2021 Last Refill: 02/27/2020 #90 and 1RF Pt sig: 1/2 to 1 tab qhs prn  Please Advise

## 2020-11-05 ENCOUNTER — Telehealth: Payer: Self-pay

## 2020-11-05 DIAGNOSIS — Z20822 Contact with and (suspected) exposure to covid-19: Secondary | ICD-10-CM | POA: Diagnosis not present

## 2020-11-05 NOTE — Telephone Encounter (Signed)
Nurse Assessment Nurse: Laverta Baltimore, RN, Erin Date/Time (Eastern Time): 11/03/2020 4:40:51 PM Confirm and document reason for call. If symptomatic, describe symptoms. ---Caller states she has been exposed to someone with Covid-19 but does not have any symptoms. Does the patient have any new or worsening symptoms? ---Yes Will a triage be completed? ---Yes Related visit to physician within the last 2 weeks? ---No Does the PT have any chronic conditions? (i.e. diabetes, asthma, this includes High risk factors for pregnancy, etc.) ---Yes List chronic conditions. ---CHF Is this a behavioral health or substance abuse call? ---No Guidelines Guideline Title Affirmed Question Affirmed Notes Nurse Date/Time (Eastern Time) COVID-19 - Exposure [1] CLOSE CONTACT COVID-19 EXPOSURE within last 14 days AND [2] needs COVID-19 lab test to return to work AND [3] NO symptoms Long, RN, Junie Panning 11/03/2020 4:42:56 PM COVID-19 - Exposure [1] CLOSE CONTACT COVID-19 EXPOSURE within last 14 days AND [2] NO symptoms Long, RN, Erin 11/03/2020 4:52:52 PM Disp. Time Eilene Ghazi Time) Disposition Final User 11/03/2020 4:47:15 PM Call PCP within 24 Hours Long, RN, Erin PLEASE NOTE: All timestamps contained within this report are represented as [place] Standard Time. CONFIDENTIALTY NOTICE: This fax transmission is intended only for the addressee. It contains information that is legally privileged, confidential or otherwise protected from use or disclosure. If you are not the intended recipient, you are strictly prohibited from reviewing, disclosing, copying using or disseminating any of this information or taking any action in reliance on or regarding this information. If you have received this fax in error, please notify us immediately by telephone so that we can arrange for its return to [place]. Phone: [phone], Toll-Free: [phone], Fax: [phone] Page: 2 of 2 Call Id: [id number] 11/03/2020 4:59:52 PM Call PCP when  Office is Open Yes Long, RN, Ritta Slot Disagree/Comply Comply Caller Understands Yes PreDisposition Home Care Care Advice Given Per Guideline CALL PCP WITHIN 24 HOURS: * You need to discuss this with your doctor (or NP/PA) within the next 24 hours. * IF OFFICE WILL BE CLOSED: I'll page the on-call provider now. EXCEPTION: from 9 pm to 9 am. Since this isn't urgent, we'll hold the page until morning. REASSURANCE AND EDUCATION - VACCINATED PERSON WITH COVID-19 EXPOSURE AND NO SYMPTOMS: * GET TESTED: A person who had a COVID-19 exposure and is asymptomatic should get a COVID-19 viral test about 5 TO 7 DAYS after exposure. * WEAR A MASK: Wear a mask if you must be around other people until you get a negative test result. CALL BACK IF: * Fever or feeling feverish occurs within 14 days of COVID-19 exposure * Cough or difficulty breathing occur within 14 days of COVID-19 exposure * Loss of taste or smell occur * Other symptoms you think might be from COVID-19 occur * You have more questions CALL PCP WHEN OFFICE IS OPEN: * You need to discuss this with your doctor (or NP/PA) within the next few days. * Call the office when it is open. COVID-19 - SYMPTOMS: * COVID-19 most often causes a respiratory illness. * The most common symptoms are: cough, fever, and shortness of breath. * Some people may have minimal symptoms or even have no symptoms (asymptomatic). CALL BACK IF: * Fever or feeling feverish occurs within 14 days of COVID-19 exposure * Loss of taste or smell occur * Cough or difficulty breathing occur within 14 days of COVID-19 exposure * Other symptoms you think might be from COVID-19 occur * You have more questions Comments User: Theodis Blaze, RN Date/Time Eilene Ghazi Time):  11/03/2020 4:42:23 PM Caller states had the 2 shots and the booster User: Theodis Blaze, RN Date/Time (Eastern Time): 11/03/2020 4:58:04 PM Got to the wrong yes question, had to run the guideline again. Referrals REFERRED TO  PCP OFFICE

## 2020-11-06 ENCOUNTER — Telehealth (HOSPITAL_COMMUNITY): Payer: Self-pay | Admitting: Pharmacy Technician

## 2020-11-06 ENCOUNTER — Other Ambulatory Visit (HOSPITAL_COMMUNITY): Payer: Self-pay | Admitting: Internal Medicine

## 2020-11-06 DIAGNOSIS — H2513 Age-related nuclear cataract, bilateral: Secondary | ICD-10-CM | POA: Diagnosis not present

## 2020-11-06 DIAGNOSIS — H353131 Nonexudative age-related macular degeneration, bilateral, early dry stage: Secondary | ICD-10-CM | POA: Diagnosis not present

## 2020-11-06 DIAGNOSIS — H25013 Cortical age-related cataract, bilateral: Secondary | ICD-10-CM | POA: Diagnosis not present

## 2020-11-06 DIAGNOSIS — H40013 Open angle with borderline findings, low risk, bilateral: Secondary | ICD-10-CM | POA: Diagnosis not present

## 2020-11-06 NOTE — Telephone Encounter (Signed)
It's time to re-enroll patient to receive medication assistance for Entresto from [employer]. Left voicemail for patient to call me back so that we can start the re-enrollment process.  Riaz Onorato F Macoy Rodwell, CPhT     

## 2020-11-07 ENCOUNTER — Telehealth (HOSPITAL_COMMUNITY): Payer: Self-pay | Admitting: Pharmacy Technician

## 2020-11-07 NOTE — Telephone Encounter (Signed)
Spoke with patient. Application will be at the front check in desk for her to sign. Will fax once signatures are obtained.  Charlann Boxer, CPhT

## 2020-11-08 ENCOUNTER — Other Ambulatory Visit (HOSPITAL_COMMUNITY): Payer: Self-pay | Admitting: *Deleted

## 2020-11-14 ENCOUNTER — Telehealth (HOSPITAL_COMMUNITY): Payer: Self-pay | Admitting: Pharmacy Technician

## 2020-11-14 NOTE — Telephone Encounter (Signed)
Sent in [employer] application via fax.  Will follow up.  

## 2020-12-07 ENCOUNTER — Ambulatory Visit (INDEPENDENT_AMBULATORY_CARE_PROVIDER_SITE_OTHER): Payer: Medicare Other

## 2020-12-07 DIAGNOSIS — I428 Other cardiomyopathies: Secondary | ICD-10-CM | POA: Diagnosis not present

## 2020-12-07 DIAGNOSIS — I5022 Chronic systolic (congestive) heart failure: Secondary | ICD-10-CM

## 2020-12-10 LAB — CUP PACEART REMOTE DEVICE CHECK
Battery Remaining Longevity: 18 mo
Battery Remaining Percentage: 34 %
Brady Statistic RA Percent Paced: 0 %
Brady Statistic RV Percent Paced: 100 %
Date Time Interrogation Session: 20220114001100
HighPow Impedance: 59 Ohm
Implantable Lead Implant Date: 20050701
Implantable Lead Implant Date: 20050701
Implantable Lead Implant Date: 20140314
Implantable Lead Location: 753858
Implantable Lead Location: 753859
Implantable Lead Location: 753860
Implantable Lead Model: 158
Implantable Lead Model: 4196
Implantable Lead Model: 5076
Implantable Lead Serial Number: 156416
Implantable Pulse Generator Implant Date: 20140314
Lead Channel Impedance Value: 470 Ohm
Lead Channel Impedance Value: 556 Ohm
Lead Channel Impedance Value: 692 Ohm
Lead Channel Pacing Threshold Amplitude: 0.7 V
Lead Channel Pacing Threshold Amplitude: 0.7 V
Lead Channel Pacing Threshold Amplitude: 2.2 V
Lead Channel Pacing Threshold Pulse Width: 0.4 ms
Lead Channel Pacing Threshold Pulse Width: 0.6 ms
Lead Channel Pacing Threshold Pulse Width: 1.2 ms
Lead Channel Setting Pacing Amplitude: 2 V
Lead Channel Setting Pacing Amplitude: 2 V
Lead Channel Setting Pacing Amplitude: 3 V
Lead Channel Setting Pacing Pulse Width: 0.6 ms
Lead Channel Setting Pacing Pulse Width: 1 ms
Lead Channel Setting Sensing Sensitivity: 0.5 mV
Lead Channel Setting Sensing Sensitivity: 1 mV
Pulse Gen Serial Number: 111235

## 2020-12-22 NOTE — Progress Notes (Signed)
Remote ICD transmission.   

## 2020-12-31 DIAGNOSIS — M1811 Unilateral primary osteoarthritis of first carpometacarpal joint, right hand: Secondary | ICD-10-CM | POA: Diagnosis not present

## 2020-12-31 DIAGNOSIS — M25511 Pain in right shoulder: Secondary | ICD-10-CM | POA: Diagnosis not present

## 2021-01-02 ENCOUNTER — Telehealth (HOSPITAL_COMMUNITY): Payer: Self-pay

## 2021-01-02 NOTE — Telephone Encounter (Deleted)
Advanced Heart Failure Patient Advocate Encounter   Patient was approved to receive Entresto from [employer]  Patient ID: [id number] Effective dates: 12/21/2020 through 11/23/2021  Left message with family member.   Penni Homans, Student-PharmD

## 2021-01-07 ENCOUNTER — Telehealth (HOSPITAL_COMMUNITY): Payer: Self-pay | Admitting: Pharmacist

## 2021-01-07 NOTE — Telephone Encounter (Signed)
Encounter opened in error

## 2021-01-07 NOTE — Telephone Encounter (Signed)
Advanced Heart Failure Patient Advocate Encounter   Patient was approved to receive Entresto from [employer]  Patient ID: [id number] Effective dates: 12/21/2020 through 11/23/2021  Audry Riles, PharmD, BCPS, BCCP, CPP Heart Failure Clinic Pharmacist [phone]

## 2021-01-07 NOTE — Telephone Encounter (Signed)
Entered in error

## 2021-01-23 ENCOUNTER — Other Ambulatory Visit (HOSPITAL_COMMUNITY): Payer: Self-pay | Admitting: Internal Medicine

## 2021-01-30 ENCOUNTER — Encounter: Payer: Self-pay | Admitting: Internal Medicine

## 2021-01-30 ENCOUNTER — Ambulatory Visit: Payer: Medicare Other | Admitting: Physician Assistant

## 2021-01-30 ENCOUNTER — Ambulatory Visit (INDEPENDENT_AMBULATORY_CARE_PROVIDER_SITE_OTHER): Payer: Medicare Other | Admitting: Internal Medicine

## 2021-01-30 ENCOUNTER — Other Ambulatory Visit: Payer: Self-pay

## 2021-01-30 VITALS — BP 124/68 | HR 70 | Temp 97.9°F | Resp 16 | Ht 64.0 in | Wt 174.1 lb

## 2021-01-30 DIAGNOSIS — K5792 Diverticulitis of intestine, part unspecified, without perforation or abscess without bleeding: Secondary | ICD-10-CM | POA: Diagnosis not present

## 2021-01-30 LAB — POC URINALSYSI DIPSTICK (AUTOMATED)
Bilirubin, UA: NEGATIVE
Glucose, UA: NEGATIVE
Ketones, UA: NEGATIVE
Leukocytes, UA: NEGATIVE
Nitrite, UA: NEGATIVE
Protein, UA: NEGATIVE
Spec Grav, UA: 1.02 (ref 1.010–1.025)
Urobilinogen, UA: 0.2 E.U./dL
pH, UA: 6 (ref 5.0–8.0)

## 2021-01-30 MED ORDER — METRONIDAZOLE 500 MG PO TABS: 500.0000 mg | ORAL_TABLET | Freq: Three times a day (TID) | ORAL | 0 refills | Status: DC

## 2021-01-30 MED ORDER — CIPROFLOXACIN HCL 500 MG PO TABS: 500.0000 mg | ORAL_TABLET | Freq: Two times a day (BID) | ORAL | 0 refills | Status: DC

## 2021-01-30 NOTE — Progress Notes (Signed)
Subjective:    Patient ID: [name], female    DOB: 1951/03/25, 70 y.o.   MRN: [id number]  DOS:  01/30/2021 Type of visit - description: Acute Symptoms a started 5 days ago: Left lower quadrant abdominal pain consistent with previous history of diverticulitis. About 4 days ago she felt feverish and had some chills but that is largely resolved. The pain somewhat radiates to the suprapubic area and to the back.  Denies nausea, vomiting or diarrhea.  No constipation Appetite is normal. No dysuria or gross hematuria No vaginal bleeding or discharge   Review of Systems See above   Past Medical History:  Diagnosis Date  . Breast CA ([place])    twice first on L breast 1982 w/ chest wall involvment, then a second breast ca on the R in 1987  . Chronic systolic CHF (congestive heart failure) ([place])    a. due to Adriamycin,s/p ICD;  b. 01/2013 Gen change and new LV lead - BSX Energen CRT-D BiV ICD, Ser # [id number]  . Depression   . Diverticulitis   . Early menopause    early 30s  . Fatigue 02/20/2016  . Glaucoma suspect of both eyes    Dr. Marshall Cork  . Nonischemic cardiomyopathy ([place])   . OSA (obstructive sleep apnea) 02/07/2016  . Osteopenia   . Renal calculus   . Snoring 02/20/2016    Past Surgical History:  Procedure Laterality Date  . BI-VENTRICULAR IMPLANTABLE CARDIOVERTER DEFIBRILLATOR UPGRADE N/A 02/04/2013   Procedure: BI-VENTRICULAR IMPLANTABLE CARDIOVERTER DEFIBRILLATOR UPGRADE;  Surgeon: Evans Lance, MD;  Location: [facility] CATH LAB;  Service: Cardiovascular;  Laterality: N/A;  . CARDIAC DEFIBRILLATOR PLACEMENT     AICD Replaced-5/09 and 2014  . LEFT AND RIGHT HEART CATHETERIZATION WITH CORONARY ANGIOGRAM N/A 10/06/2013   Procedure: LEFT AND RIGHT HEART CATHETERIZATION WITH CORONARY ANGIOGRAM;  Surgeon: Jolaine Artist, MD;  Location: [facility] CATH LAB;  Service: Cardiovascular;  Laterality: N/A;  . LITHOTRIPSY     h/o several procedures   . MASTECTOMY Bilateral    . OOPHORECTOMY Bilateral   . triger finger, R ring finger Right 02/2019  . VENOGRAM N/A 10/20/2012   Procedure: VENOGRAM;  Surgeon: Deboraha Sprang, MD;  Location: [facility] CATH LAB;  Service: Cardiovascular;  Laterality: N/A;    Allergies as of 01/30/2021      Reactions   Atacand [candesartan] Other (See Comments)   Causes fatigue   Cephalexin Hives, Itching   Latex Rash      Medication List       Accurate as of January 30, 2021 11:59 PM. If you have any questions, ask your nurse or doctor.        ascorbic acid 1000 MG tablet Commonly known as: VITAMIN C Take 1,000 mg by mouth daily.   atorvastatin 40 MG tablet Commonly known as: LIPITOR Take 1 tablet (40 mg total) by mouth daily.   Biotin 5000 MCG Caps Take 5,000 mcg by mouth daily.   carvedilol 12.5 MG tablet Commonly known as: COREG TAKE ONE TABLET BY MOUTH IN THE MORNING AND AT NIGHT   ciprofloxacin 500 MG tablet Commonly known as: CIPRO Take 1 tablet (500 mg total) by mouth 2 (two) times daily. Started by: Kathlene November, MD   clopidogrel 75 MG tablet Commonly known as: PLAVIX TAKE ONE TABLET BY MOUTH ONE TIME DAILY   digoxin 0.125 MG tablet Commonly known as: LANOXIN TAKE ONE TABLET BY MOUTH ONE TIME DAILY   Entresto 24-26 MG Generic drug:  sacubitril-valsartan Take 1 tablet by mouth 2 (two) times daily.   furosemide 40 MG tablet Commonly known as: LASIX TAKE 2 TABLETS BY MOUTH IN THE MORNING AND 1 TABLET IN THE EVENING   gabapentin 300 MG capsule Commonly known as: NEURONTIN Take 300 mg by mouth daily as needed (for shingles flares).   metroNIDAZOLE 500 MG tablet Commonly known as: FLAGYL Take 1 tablet (500 mg total) by mouth 3 (three) times daily. Started by: Kathlene November, MD   multivitamin per tablet Take 1 tablet by mouth daily.   pantoprazole 40 MG tablet Commonly known as: PROTONIX Take 1 tablet (40 mg total) by mouth daily.   potassium chloride 10 MEQ tablet Commonly known as: KLOR-CON TAKE 3  TABLETS BY MOUTH ONCE A DAY   PROBIOTIC DAILY PO Take 1 capsule by mouth daily.   sertraline 25 MG tablet Commonly known as: ZOLOFT Take 1 tablet (25 mg total) by mouth daily.   spironolactone 25 MG tablet Commonly known as: ALDACTONE TAKE ONE TABLET BY MOUTH ONE TIME DAILY   vitamin B-12 1000 MCG tablet Commonly known as: CYANOCOBALAMIN Take 1,000 mcg by mouth daily.   zolpidem 10 MG tablet Commonly known as: AMBIEN Take 0.5-1 tablets (5-10 mg total) by mouth at bedtime as needed for sleep.          Objective:   Physical Exam Abdominal:      BP 124/68 (BP Location: Left Arm, Patient Position: Sitting, Cuff Size: Small)   Pulse 70   Temp 97.9 F (36.6 C) (Oral)   Resp 16   Ht 5\' 4"  (1.626 m)   Wt 174 lb 2 oz (79 kg)   SpO2 97%   BMI 29.89 kg/m  General:   Well developed, NAD, BMI noted.  HEENT:  Normocephalic . Face symmetric, atraumatic Lungs:  CTA B Normal respiratory effort, no intercostal retractions, no accessory muscle use. Heart: RRR, + systolic murmur.  Abdomen:  Not distended, soft Skin: Not pale. Not jaundice Lower extremities: no pretibial edema bilaterally  Neurologic:  alert & oriented X3.  Speech normal, gait appropriate for age and unassisted Psych--  Cognition and judgment appear intact.  Cooperative with normal attention span and concentration.  Behavior appropriate. No anxious or depressed appearing.     Assessment     Assessment Hyperglycemia A1c 5.9 (04/2017) CV:Dr. Caryl Comes and Bensimohn --CHF, nonischemic cardiomyopathy:due to chemotherapy --MR severe --VT -TIA 05-2017 Depression Mild OSA per sleep study 11-2015, saw Dr Radford Pax 02-20-16: no need for CPAP Osteopenia-- DEXA 06/13/2015 normal Vitamin D deficiency Kidney stones Menopause Breast cancer x 2: first in 1982 with chest wall involvement, 2nd on the R dx  1987, s/p B mastectomy; + BRACA Handicap sticker signed 06-2016 Post herpetic neuralgia (shingles 2014) , R  face-- gaba prn  PLAN: Diverticulitis: Symptoms similar to previous episodes of diverticulitis, last was 2013 documented by CT.The patient is afebrile, no evidence of acute abdomen. U dip: Trace blood (no LUTS) Plan: Treat with Cipro and Flagyl x7-day, rest, fluids. For completeness, will get a UA urine culture.  Call if no better, ER if worse.  See AVS See last visit: Abdominal ultrasound 08-2020: Essentially negative   This visit occurred during the SARS-CoV-2 public health emergency.  Safety protocols were in place, including screening questions prior to the visit, additional usage of staff PPE, and extensive cleaning of exam room while observing appropriate contact time as indicated for disinfecting solutions.

## 2021-01-30 NOTE — Patient Instructions (Signed)
Rest  Drink plenty of fluids  Take the antibiotics as Rx  Call if no gradually better   Call or ER if sever symptoms

## 2021-01-31 LAB — URINALYSIS, ROUTINE W REFLEX MICROSCOPIC
Bilirubin Urine: NEGATIVE
Ketones, ur: NEGATIVE
Leukocytes,Ua: NEGATIVE
Nitrite: NEGATIVE
Specific Gravity, Urine: 1.02 (ref 1.000–1.030)
Total Protein, Urine: NEGATIVE
Urine Glucose: NEGATIVE
Urobilinogen, UA: 0.2 (ref 0.0–1.0)
WBC, UA: NONE SEEN (ref 0–?)
pH: 6.5 (ref 5.0–8.0)

## 2021-01-31 LAB — URINE CULTURE
MICRO NUMBER:: 11626780
Result:: NO GROWTH
SPECIMEN QUALITY:: ADEQUATE

## 2021-01-31 NOTE — Assessment & Plan Note (Signed)
Diverticulitis: Symptoms similar to previous episodes of diverticulitis, last was 2013 documented by CT.The patient is afebrile, no evidence of acute abdomen. U dip: Trace blood (no LUTS) Plan: Treat with Cipro and Flagyl x7-day, rest, fluids. For completeness, will get a UA urine culture.  Call if no better, ER if worse.  See AVS See last visit: Abdominal ultrasound 08-2020: Essentially negative

## 2021-02-01 ENCOUNTER — Ambulatory Visit: Payer: Medicare Other | Admitting: Nurse Practitioner

## 2021-02-14 ENCOUNTER — Encounter: Payer: Medicare Other | Admitting: Internal Medicine

## 2021-02-14 DIAGNOSIS — Z95810 Presence of automatic (implantable) cardiac defibrillator: Secondary | ICD-10-CM

## 2021-02-14 DIAGNOSIS — I428 Other cardiomyopathies: Secondary | ICD-10-CM

## 2021-02-14 DIAGNOSIS — I5022 Chronic systolic (congestive) heart failure: Secondary | ICD-10-CM

## 2021-02-14 DIAGNOSIS — I472 Ventricular tachycardia: Secondary | ICD-10-CM

## 2021-02-17 NOTE — Progress Notes (Signed)
ADVANCED HEART FAILURE CLINIC NOTE  Patient ID: [name], female   DOB: May 28, 1951, 69 y.o.   MRN: [id number] PCP: Dr Larose Kells EP: Dr Caryl Comes  Subjective  [name] is a 70 y/o woman with h/o chronic systolic presumed due to chemotherapy-related cardiomyopathy, left breast cancer in 1982 treated with chemo (including adriamycin)/XRT and L mastectomy. Had R breast cancer (unrelated) in 1987. Has been cancer free since. Denies HTN, HL, DM2.  Was first diagnosed with HF in 1988. Had left heart cath she thinks in 2009. 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 Echo 2009 EF 20% with moderate AI and mild to moderate MR. Had [place] ICD placed and then LV lead became nonfunctional. In 3/14, underwent CRT revision.   Had sleep study 1/17 very mild OSA (6.2/hr) not using CPAP.   Admitted in 6/18 withTIA. Unable to use right hand and speech slurred. CT normal. Unable to do MRI.   She returns today for regular follow up. Doing pretty well. Main issue is that she is having diaphragmatic stimulation. Mild DOE if she walks too far or too fast. Able to do ADLs without problem. Mild edema in left leg only.   Echo 6/21 EF 40-45%   Echo 12/19 EF 25% RV ok    ICD interrogated personally: No VT/VF No AF.  Activity level 2.3hr/day Personally reviewed  ECHO 09/2013 with Dr. Wynonia Lawman and EF 10-15% with significant dyssynchrony. Subsequently had LV lead checked and was functioning well.  ECHO 7/15 EF 10-15%, moderate to severe RV dysfunction ECHO 4/16 EF 15% normal RV moderate AI ECHO 9/17 EF 20-25% RV ok. Severe MR mild AI/AS Echo 6/18 EF 20-25% RV ok Mild to moderate AI   CPX 2/19 FVC 3.41 (117%)    FEV1 2.45 (108%)     FEV1/FVC 72 (92%)     MVV 78 (89%)    Resting HR: 74 Peak HR: 121  (80% age predicted max HR) BP rest: 108/56 BP peak: 136/58 Peak VO2: 18.3 (103% predicted peak VO2) VE/VCO2 slope: 30 OUES: 2.45 Peak RER: 0.95  CPX 10/17  FVC  3.44 (113%)    FEV1 2.32 (97%)     FEV1/FVC 67 (86%)     MVV 90 (101%)    Resting HR: 72 Peak HR: 112 (73% age predicted max HR)  BP rest: 104/64 BP peak: 146/64 Peak VO2: 16.0 (91% predicted peak VO2) VE/VCO2 slope: 34.6 OUES: 1.90 Peak RER: 1.10 Ventilatory Threshold: 13.5 (76.9% predicted or measured peak VO2) VE/MVV: 64% PETCO2 at peak: 29 O2pulse: 13  (130% predicted O2pulse)  LHC/RHC 10/06/13  RA = 11  RV = 54/9/13  PA = 53/27 (38)  PCW = 23  Fick cardiac output/index = 3.8/2.0  Thermo CO/CI = 3.4/1.8  PVR = 3.9 WU (Fick)  FA sat = 98%  PA sat = 62%, 64%  SVC = 59%  Coronaries no significant disease but there was a fistula from small D1 => PA.   CPX 10/25/13  Peak VO2: 15.6 (81.2% predicted peak VO2) VE/VCO2 slope: 36.9 OUES: 1.25 Peak RER: 1.08  CPX 05/24/14 FVC 3.05 (92%)  FEV1 2.23 (88%)  FEV1/FVC 73%  MVV 92 (100%) Resting HR: 64 Peak HR: 132 (85% age predicted max HR) BP rest: 108/70 BP peak: 130/66  Peak VO2: 16.4 (81.1% predicted peak VO2) VE/VCO2 slope: 33.6 OUES: 1.74 Peak RER: 1.16 Ventilatory Threshold: 12.5 (61.8% predicted peak VO2) VE/MVV: 62% PETCO2 at peak: 31 O2pulse: 12 (109% predicted O2pulse)  Lab  10/06/13 K 2.8 Creatinine 0.83 Labs 10/31/13 K 3.0 Creatinine 0.68  Dig level 0.9 Pro BNP 3906  Labs 11/11/13  K 3.6 Creatinine 0.8 Labs 12/16/13 K 3.8 Creatinine 0.8  Labs 4/15 K 4, creatinine 0.8, LDL 128, TSH normal, digoxin 0.6 Labs 6/15 K 4.1 Cr 0.8 digoxin 0.7 Labs 8/15 K 4.2, creatinine 0.78, pBNP 2126 Labs 8/15 K 4.2, creatinine 1.0 dig <0.5  Blood type A+   FHX: Had older brother with HF in his 33s. No strong FHx of HF.   SocHx: Retired. Non-smoker. Rare ETOH. 3 grown kids.   Review of systems complete and found to be negative unless listed in HPI.    Past Medical History:  Diagnosis Date  . Breast CA ([place])    twice first on L breast 1982 w/ chest wall involvment, then a second breast ca on the R in 1987  .  Chronic systolic CHF (congestive heart failure) ([place])    a. due to Adriamycin,s/p ICD;  b. 01/2013 Gen change and new LV lead - BSX [employer] CRT-D BiV ICD, Ser # [id number]  . Depression   . Diverticulitis   . Early menopause    early 46s  . Fatigue 02/20/2016  . Glaucoma suspect of both eyes    Dr. Marshall Cork  . Nonischemic cardiomyopathy ([place])   . OSA (obstructive sleep apnea) 02/07/2016  . Osteopenia   . Renal calculus   . Snoring 02/20/2016    Past Surgical History:  Procedure Laterality Date  . BI-VENTRICULAR IMPLANTABLE CARDIOVERTER DEFIBRILLATOR UPGRADE N/A 02/04/2013   Procedure: BI-VENTRICULAR IMPLANTABLE CARDIOVERTER DEFIBRILLATOR UPGRADE;  Surgeon: Evans Lance, MD;  Location: [facility] CATH LAB;  Service: Cardiovascular;  Laterality: N/A;  . CARDIAC DEFIBRILLATOR PLACEMENT     AICD Replaced-5/09 and 2014  . LEFT AND RIGHT HEART CATHETERIZATION WITH CORONARY ANGIOGRAM N/A 10/06/2013   Procedure: LEFT AND RIGHT HEART CATHETERIZATION WITH CORONARY ANGIOGRAM;  Surgeon: Jolaine Artist, MD;  Location: [facility] CATH LAB;  Service: Cardiovascular;  Laterality: N/A;  . LITHOTRIPSY     h/o several procedures   . MASTECTOMY Bilateral   . OOPHORECTOMY Bilateral   . triger finger, R ring finger Right 02/2019  . VENOGRAM N/A 10/20/2012   Procedure: VENOGRAM;  Surgeon: Deboraha Sprang, MD;  Location: [facility] CATH LAB;  Service: Cardiovascular;  Laterality: N/A;    Current Outpatient Medications  Medication Sig Dispense Refill  . ascorbic acid (VITAMIN C) 1000 MG tablet Take 1,000 mg by mouth daily.    Marland Kitchen atorvastatin (LIPITOR) 40 MG tablet Take 1 tablet (40 mg total) by mouth daily. 90 tablet 1  . Biotin 5000 MCG CAPS Take 5,000 mcg by mouth daily.     . carvedilol (COREG) 12.5 MG tablet TAKE ONE TABLET BY MOUTH IN THE MORNING AND AT NIGHT 180 tablet 0  . clopidogrel (PLAVIX) 75 MG tablet TAKE ONE TABLET BY MOUTH ONE TIME DAILY 90 tablet 1  . digoxin (LANOXIN) 0.125 MG tablet TAKE ONE TABLET BY  MOUTH ONE TIME DAILY 90 tablet 3  . furosemide (LASIX) 40 MG tablet TAKE 2 TABLETS BY MOUTH IN THE MORNING AND 1 TABLET IN THE EVENING 270 tablet 0  . gabapentin (NEURONTIN) 300 MG capsule Take 300 mg by mouth daily as needed (for shingles flares).     . multivitamin (THERAGRAN) per tablet Take 1 tablet by mouth daily.    . pantoprazole (PROTONIX) 40 MG tablet Take 1 tablet (40 mg total) by mouth daily. 90 tablet 3  .  potassium chloride (KLOR-CON) 10 MEQ tablet TAKE 3 TABLETS BY MOUTH ONCE A DAY 270 tablet 0  . Probiotic Product (PROBIOTIC DAILY PO) Take 1 capsule by mouth daily.     . sacubitril-valsartan (ENTRESTO) 24-26 MG Take 1 tablet by mouth 2 (two) times daily. 60 tablet 11  . sertraline (ZOLOFT) 25 MG tablet Take 1 tablet (25 mg total) by mouth daily. 90 tablet 1  . spironolactone (ALDACTONE) 25 MG tablet TAKE ONE TABLET BY MOUTH ONE TIME DAILY 90 tablet 3  . vitamin B-12 (CYANOCOBALAMIN) 1000 MCG tablet Take 1,000 mcg by mouth daily.    Marland Kitchen zolpidem (AMBIEN) 10 MG tablet Take 0.5-1 tablets (5-10 mg total) by mouth at bedtime as needed for sleep. 90 tablet 1   No current facility-administered medications for this encounter.   Allergies  Allergen Reactions  . Atacand [Candesartan] Other (See Comments)    Causes fatigue   . Cephalexin Hives and Itching  . Latex Rash   Physical Exam  Vitals:   02/18/21 0951  BP: 110/62  Pulse: 66  SpO2: 96%  Weight: 80.4 kg (177 lb 3.2 oz)   Wt Readings from Last 3 Encounters:  02/18/21 80.4 kg (177 lb 3.2 oz)  01/30/21 79 kg (174 lb 2 oz)  08/28/20 81.6 kg (180 lb)   General:  Well appearing. No resp difficulty HEENT: normal Neck: supple. no JVD. Carotids 2+ bilat; no bruits. No lymphadenopathy or thryomegaly appreciated. Cor: PMI nondisplaced. Regular rate & rhythm. No rubs, gallops or murmurs. Lungs: clear Abdomen: soft, nontender, nondistended. No hepatosplenomegaly. No bruits or masses. Good bowel sounds. Extremities: no cyanosis,  clubbing, rash, tr edema on L Neuro: alert & orientedx3, cranial nerves grossly intact. moves all 4 extremities w/o difficulty. Affect pleasant  ECG: NSR 64 vpaced 64 Personally reviewed  Assessment and  Plan  1) Chronic systolic HF:  - Nonischemic cardiomyopathy, possibly anthracycline cardiotoxicity.  LHC 11/14 without significant coronary disease.    Has [employer] CRT-D. ECHO 09/2013 EF 10-15%. [facility] at [facility] 4/16 EF 15% moderate AI.  - Echo 9/17 EF 20-25%.  RV ok. Severe MR mild AI/AS  Seen in [facility] and will follow prn - Echo 04/2017 LVEF 20-25%, Grade 1 DD. - Echo 12/19 EF 20-25% RV ok - Echo 6/21 EF improved to 40-45% - Stable NYHA II-early II - Volume stable on exam  - ICD interrogated personally. No VT/AF. Volume ok. Having diaphragmatic stim. We will have industry reprogram today  - Continue lasix 80 mg q am and 40 mg q pm.  - Continue Entresto 24/26 mg BID, carvedilol 6.25/12.5, spironolactone 25 mg daily. Has not been able to tolerate higher doses due to low BP.  - Will stop digoxin - We again discussed addition of [place] today with weaning of lasix but she would like to think about it -Continues to do well despite markedly depressed LVEF. CPX 2/19  improved. She has aged out of transplant consideration and says she would not want an LVAD if she got worse.  - Labs today. Repeat echo at next visit to ensure stability 2) h/o bilateral breast CA in 1982 and 1987 (treated with Adriamycin in 1982) - stable. No change 3) ICD  - follows with Dr. Caryl Comes. Interrogation today as above, Having diaphragmatic stim. Wil have industry rep look at it 4) h/o TIA.  - Continue Plavix and statin. No recent Neuro symptoms. No change   Glori Bickers, MD 02/18/2021

## 2021-02-18 ENCOUNTER — Ambulatory Visit (HOSPITAL_COMMUNITY)
Admission: RE | Admit: 2021-02-18 | Discharge: 2021-02-18 | Disposition: A | Discharge status: Home / Self Care | Payer: Medicare Other | Source: Ambulatory Visit | Attending: Internal Medicine | Admitting: Internal Medicine

## 2021-02-18 ENCOUNTER — Encounter (HOSPITAL_COMMUNITY): Payer: Self-pay | Admitting: Internal Medicine

## 2021-02-18 ENCOUNTER — Other Ambulatory Visit: Payer: Self-pay

## 2021-02-18 VITALS — BP 110/62 | HR 66 | Wt 177.2 lb

## 2021-02-18 DIAGNOSIS — Z881 Allergy status to other antibiotic agents status: Secondary | ICD-10-CM | POA: Insufficient documentation

## 2021-02-18 DIAGNOSIS — Z91040 Latex allergy status: Secondary | ICD-10-CM | POA: Insufficient documentation

## 2021-02-18 DIAGNOSIS — Z79899 Other long term (current) drug therapy: Secondary | ICD-10-CM | POA: Insufficient documentation

## 2021-02-18 DIAGNOSIS — I428 Other cardiomyopathies: Secondary | ICD-10-CM | POA: Insufficient documentation

## 2021-02-18 DIAGNOSIS — Z95810 Presence of automatic (implantable) cardiac defibrillator: Secondary | ICD-10-CM | POA: Insufficient documentation

## 2021-02-18 DIAGNOSIS — I5022 Chronic systolic (congestive) heart failure: Secondary | ICD-10-CM | POA: Diagnosis not present

## 2021-02-18 DIAGNOSIS — G4733 Obstructive sleep apnea (adult) (pediatric): Secondary | ICD-10-CM | POA: Diagnosis not present

## 2021-02-18 DIAGNOSIS — Z8673 Personal history of transient ischemic attack (TIA), and cerebral infarction without residual deficits: Secondary | ICD-10-CM | POA: Diagnosis not present

## 2021-02-18 DIAGNOSIS — I480 Paroxysmal atrial fibrillation: Secondary | ICD-10-CM | POA: Diagnosis not present

## 2021-02-18 DIAGNOSIS — Z7902 Long term (current) use of antithrombotics/antiplatelets: Secondary | ICD-10-CM | POA: Diagnosis not present

## 2021-02-18 DIAGNOSIS — Z853 Personal history of malignant neoplasm of breast: Secondary | ICD-10-CM | POA: Diagnosis not present

## 2021-02-18 DIAGNOSIS — Z9012 Acquired absence of left breast and nipple: Secondary | ICD-10-CM | POA: Diagnosis not present

## 2021-02-18 LAB — BRAIN NATRIURETIC PEPTIDE: B Natriuretic Peptide: 83.5 pg/mL (ref 0.0–100.0)

## 2021-02-18 LAB — BASIC METABOLIC PANEL
Anion gap: 7 (ref 5–15)
BUN: 11 mg/dL (ref 8–23)
CO2: 28 mmol/L (ref 22–32)
Calcium: 9.2 mg/dL (ref 8.9–10.3)
Chloride: 104 mmol/L (ref 98–111)
Creatinine, Ser: 0.76 mg/dL (ref 0.44–1.00)
GFR, Estimated: >60 mL/min (ref >60)
Glucose, Bld: 115 mg/dL — ABNORMAL HIGH (ref 70–99)
Potassium: 3.8 mmol/L (ref 3.5–5.1)
Sodium: 139 mmol/L (ref 135–145)

## 2021-02-18 NOTE — Addendum Note (Signed)
Encounter addended by: Scarlette Calico, RN on: 02/18/2021 10:36 AM  Actions taken: Medication long-term status modified, Order list changed, Diagnosis association updated, Clinical Note Signed, Charge Capture section accepted

## 2021-02-18 NOTE — Patient Instructions (Signed)
Stop Digoxin  Labs done today, your results will be available in MyChart, we will contact you for abnormal readings.  Your physician recommends that you schedule a follow-up appointment in: 6 months with echocardiogram  If you have any questions or concerns before your next appointment please send [place] a message through [place] or call our office at [phone].    TO LEAVE A MESSAGE FOR THE NURSE SELECT OPTION 2, PLEASE LEAVE A MESSAGE INCLUDING: . YOUR NAME . DATE OF BIRTH . CALL BACK NUMBER . REASON FOR CALL**this is important as we prioritize the call backs  [place] AS LONG AS YOU CALL BEFORE 4:00 PM  At the [facility], you and your health needs are our priority. As part of our continuing mission to provide you with exceptional heart care, we have created designated Provider Care Teams. These Care Teams include your primary Cardiologist (physician) and Advanced Practice Providers (APPs- Physician Assistants and Nurse Practitioners) who all work together to provide you with the care you need, when you need it.   You may see any of the following providers on your designated Care Team at your next follow up: Marland Kitchen Dr Glori Bickers . Dr Loralie Champagne . Dr Vickki Muff . Darrick Grinder, NP . Lyda Jester, [place] . Audry Riles, PharmD   Please be sure to bring in all your medications bottles to every appointment.

## 2021-02-26 ENCOUNTER — Ambulatory Visit (INDEPENDENT_AMBULATORY_CARE_PROVIDER_SITE_OTHER): Payer: Medicare Other | Admitting: Internal Medicine

## 2021-02-26 ENCOUNTER — Ambulatory Visit: Payer: Medicare Other | Admitting: Internal Medicine

## 2021-02-26 ENCOUNTER — Encounter: Payer: Self-pay | Admitting: Internal Medicine

## 2021-02-26 ENCOUNTER — Other Ambulatory Visit: Payer: Self-pay

## 2021-02-26 VITALS — BP 114/60 | HR 57 | Ht 64.0 in | Wt 175.0 lb

## 2021-02-26 DIAGNOSIS — I5022 Chronic systolic (congestive) heart failure: Secondary | ICD-10-CM

## 2021-02-26 DIAGNOSIS — I4729 Other ventricular tachycardia: Secondary | ICD-10-CM

## 2021-02-26 DIAGNOSIS — Z95810 Presence of automatic (implantable) cardiac defibrillator: Secondary | ICD-10-CM | POA: Diagnosis not present

## 2021-02-26 DIAGNOSIS — I428 Other cardiomyopathies: Secondary | ICD-10-CM

## 2021-02-26 DIAGNOSIS — I472 Ventricular tachycardia, unspecified: Secondary | ICD-10-CM

## 2021-02-26 NOTE — Progress Notes (Signed)
After watching   seen    Patient Care Team: Colon Branch, MD as PCP - General Bensimhon, Shaune Pascal, MD as Consulting Physician (Cardiology) Hortencia Pilar, MD as Consulting Physician (Ophthalmology) Rod Can, MD as Consulting Physician (Orthopedic Surgery)   HPI  [name] is a 70 y.o. female seen in followup for congestive heart failure in the setting of chemotherapy associated cardiomyopathy with previously implanted CRT originally as part of the [facility] CRT protocol; her LV lead had failed and was plugged.  When she began to develop worsening symptoms of heart failure in the fall and in the winter 2014  she underwent CRT upgrade. Previously ollowed by Dr. Wynonia Lawman and the heart failure clinic.  She has a history of a TIA.  9/18 aspirin was decreased to 81 and Plavix was added.  Seen by heart failure 3/22 with complaints of diaphragmatic stimulation; industry called to reprogram   The patient denies chest pain, nocturnal dyspnea, orthopnea or peripheral edema.  There have been no palpitations, lightheadedness or syncope.  Shortness of breath with only moderate exertion   Her twin brother and nephew both died 03/03/2020- devastating   DATE TEST EF   03/03/08 LHC  No angiogr CAD  2008/03/03 Echo  20%   11/14 Echo   10-15 %   7/15 Echo   10-15 % Severe RV dysfn  9/17 Echo  20-25% AI mod MR severe  6/18 Echo  20-25% MR mild AI mod  12/19 Echo  20/25% MR severe  eccentric  6/21 Echo  40-45%  MR-mild      Date Cr K Dig  3/18  0.91 4.4 0.7  12/18 0.96 4.2 0.3 (6/18)  7/20 0.8 4.5 0.5  3/22 0.76 3.8    Date LDL  8/17 118  5/20  60     Past Medical History:  Diagnosis Date  . Breast CA ([place])    twice first on L breast 1981/03/03 w/ chest wall involvment, then a second breast ca on the R in Mar 03, 1986  . Chronic systolic CHF (congestive heart failure) ([place])    a. due to Adriamycin,s/p ICD;  b. 03-03-2013 Gen change and new LV lead - BSX [employer] CRT-D BiV ICD, Ser # [id number]  .  Depression   . Diverticulitis   . Early menopause    early 74s  . Fatigue 02/20/2016  . Glaucoma suspect of both eyes    Dr. Marshall Cork  . Nonischemic cardiomyopathy ([place])   . OSA (obstructive sleep apnea) 02/07/2016  . Osteopenia   . Renal calculus   . Snoring 02/20/2016    Past Surgical History:  Procedure Laterality Date  . BI-VENTRICULAR IMPLANTABLE CARDIOVERTER DEFIBRILLATOR UPGRADE N/A 02/04/2013   Procedure: BI-VENTRICULAR IMPLANTABLE CARDIOVERTER DEFIBRILLATOR UPGRADE;  Surgeon: Evans Lance, MD;  Location: [facility] CATH LAB;  Service: Cardiovascular;  Laterality: N/A;  . CARDIAC DEFIBRILLATOR PLACEMENT     AICD Replaced-5/09 and 03-03-13  . LEFT AND RIGHT HEART CATHETERIZATION WITH CORONARY ANGIOGRAM N/A 10/06/2013   Procedure: LEFT AND RIGHT HEART CATHETERIZATION WITH CORONARY ANGIOGRAM;  Surgeon: Jolaine Artist, MD;  Location: [facility] CATH LAB;  Service: Cardiovascular;  Laterality: N/A;  . LITHOTRIPSY     h/o several procedures   . MASTECTOMY Bilateral   . OOPHORECTOMY Bilateral   . triger finger, R ring finger Right 02/2019  . VENOGRAM N/A 10/20/2012   Procedure: VENOGRAM;  Surgeon: Deboraha Sprang, MD;  Location: [facility] CATH LAB;  Service: Cardiovascular;  Laterality: N/A;  Current Outpatient Medications  Medication Sig Dispense Refill  . ascorbic acid (VITAMIN C) 1000 MG tablet Take 1,000 mg by mouth daily.    Marland Kitchen atorvastatin (LIPITOR) 40 MG tablet Take 1 tablet (40 mg total) by mouth daily. 90 tablet 1  . Biotin 5000 MCG CAPS Take 5,000 mcg by mouth daily.     . carvedilol (COREG) 12.5 MG tablet TAKE ONE TABLET BY MOUTH IN THE MORNING AND AT NIGHT 180 tablet 0  . clopidogrel (PLAVIX) 75 MG tablet TAKE ONE TABLET BY MOUTH ONE TIME DAILY 90 tablet 1  . furosemide (LASIX) 40 MG tablet TAKE 2 TABLETS BY MOUTH IN THE MORNING AND 1 TABLET IN THE EVENING 270 tablet 0  . gabapentin (NEURONTIN) 300 MG capsule Take 300 mg by mouth daily as needed (for shingles flares).     .  multivitamin (THERAGRAN) per tablet Take 1 tablet by mouth daily.    . pantoprazole (PROTONIX) 40 MG tablet Take 1 tablet (40 mg total) by mouth daily. 90 tablet 3  . potassium chloride (KLOR-CON) 10 MEQ tablet TAKE 3 TABLETS BY MOUTH ONCE A DAY 270 tablet 0  . Probiotic Product (PROBIOTIC DAILY PO) Take 1 capsule by mouth daily.     . sacubitril-valsartan (ENTRESTO) 24-26 MG Take 1 tablet by mouth 2 (two) times daily. 60 tablet 11  . sertraline (ZOLOFT) 25 MG tablet Take 1 tablet (25 mg total) by mouth daily. 90 tablet 1  . spironolactone (ALDACTONE) 25 MG tablet TAKE ONE TABLET BY MOUTH ONE TIME DAILY 90 tablet 3  . vitamin B-12 (CYANOCOBALAMIN) 1000 MCG tablet Take 1,000 mcg by mouth daily.    Marland Kitchen zolpidem (AMBIEN) 10 MG tablet Take 0.5-1 tablets (5-10 mg total) by mouth at bedtime as needed for sleep. 90 tablet 1   No current facility-administered medications for this visit.    Allergies  Allergen Reactions  . Atacand [Candesartan] Other (See Comments)    Causes fatigue   . Cephalexin Hives and Itching  . Latex Rash    Review of Systems negative except from HPI and PMH  Physical Exam BP 114/60 (BP Location: Left Arm, Patient Position: Sitting, Cuff Size: Normal)   Pulse (!) 57   Ht 5\' 4"  (1.626 m)   Wt 175 lb (79.4 kg)   SpO2 97%   BMI 30.04 kg/m  Well developed and well nourished in no acute distress HENT normal Neck supple with JVP-flat Clear Device pocket well healed; without hematoma or erythema.  There is no tethering  Regular rate and rhythm 2/6 murmur Abd-soft with active BS No Clubbing cyanosis tr edema Skin-warm and dry A & Oriented  Grossly normal sensory and motor function  ECG  P synchronous pacing with an upright QRS lead V1 and negative QRS lead I     Assessment and  Plan  Nonischemic cardiomyopathy/MR-   Congestive heart failure chronic systolic  Ventricular tachycardia-nonsustained  Hyperlipidemia  TIA  Implantable defibrillator-[employer]-CRT  .   No intercurrent Ventricular tachycardia  Euvolemic continue current meds

## 2021-02-26 NOTE — Patient Instructions (Signed)

## 2021-03-01 ENCOUNTER — Encounter: Payer: Self-pay | Admitting: Internal Medicine

## 2021-03-08 ENCOUNTER — Ambulatory Visit (INDEPENDENT_AMBULATORY_CARE_PROVIDER_SITE_OTHER): Payer: Medicare Other

## 2021-03-08 DIAGNOSIS — I4729 Other ventricular tachycardia: Secondary | ICD-10-CM

## 2021-03-08 DIAGNOSIS — I472 Ventricular tachycardia: Secondary | ICD-10-CM | POA: Diagnosis not present

## 2021-03-12 LAB — CUP PACEART REMOTE DEVICE CHECK
Battery Remaining Longevity: 18 mo
Battery Remaining Percentage: 34 %
Brady Statistic RA Percent Paced: 0 %
Brady Statistic RV Percent Paced: 100 %
Date Time Interrogation Session: 20220415001100
HighPow Impedance: 59 Ohm
Implantable Lead Implant Date: 20050701
Implantable Lead Implant Date: 20050701
Implantable Lead Implant Date: 20140314
Implantable Lead Location: 753858
Implantable Lead Location: 753859
Implantable Lead Location: 753860
Implantable Lead Model: 158
Implantable Lead Model: 4196
Implantable Lead Model: 5076
Implantable Lead Serial Number: 156416
Implantable Pulse Generator Implant Date: 20140314
Lead Channel Impedance Value: 476 Ohm
Lead Channel Impedance Value: 478 Ohm
Lead Channel Impedance Value: 559 Ohm
Lead Channel Pacing Threshold Amplitude: 0.6 V
Lead Channel Pacing Threshold Amplitude: 0.7 V
Lead Channel Pacing Threshold Amplitude: 0.9 V
Lead Channel Pacing Threshold Pulse Width: 0.4 ms
Lead Channel Pacing Threshold Pulse Width: 0.4 ms
Lead Channel Pacing Threshold Pulse Width: 0.6 ms
Lead Channel Setting Pacing Amplitude: 2 V
Lead Channel Setting Pacing Amplitude: 2 V
Lead Channel Setting Pacing Amplitude: 2 V
Lead Channel Setting Pacing Pulse Width: 0.4 ms
Lead Channel Setting Pacing Pulse Width: 0.6 ms
Lead Channel Setting Sensing Sensitivity: 0.5 mV
Lead Channel Setting Sensing Sensitivity: 1 mV
Pulse Gen Serial Number: 111235

## 2021-03-24 ENCOUNTER — Other Ambulatory Visit: Payer: Self-pay | Admitting: Internal Medicine

## 2021-03-24 ENCOUNTER — Other Ambulatory Visit (HOSPITAL_COMMUNITY): Payer: Self-pay | Admitting: Internal Medicine

## 2021-03-25 NOTE — Progress Notes (Signed)
Remote ICD transmission.   

## 2021-04-27 ENCOUNTER — Encounter (HOSPITAL_COMMUNITY): Payer: Self-pay

## 2021-04-27 ENCOUNTER — Other Ambulatory Visit: Payer: Self-pay

## 2021-04-27 ENCOUNTER — Ambulatory Visit (HOSPITAL_COMMUNITY)
Admission: RE | Admit: 2021-04-27 | Discharge: 2021-04-27 | Disposition: A | Discharge status: Home / Self Care | Payer: Medicare Other | Source: Ambulatory Visit | Attending: Student | Admitting: Student

## 2021-04-27 VITALS — BP 120/60 | HR 78 | Temp 98.7°F | Resp 16

## 2021-04-27 DIAGNOSIS — M5412 Radiculopathy, cervical region: Secondary | ICD-10-CM

## 2021-04-27 DIAGNOSIS — M19011 Primary osteoarthritis, right shoulder: Secondary | ICD-10-CM

## 2021-04-27 MED ORDER — TIZANIDINE HCL 2 MG PO TABS: 2.0000 mg | ORAL_TABLET | Freq: Three times a day (TID) | ORAL | 0 refills | Status: DC | PRN

## 2021-04-27 MED ORDER — PREDNISONE 20 MG PO TABS: 40.0000 mg | ORAL_TABLET | Freq: Every day | ORAL | 0 refills | Status: AC

## 2021-04-27 NOTE — Discharge Instructions (Addendum)
-  Prednisone, 2 pills taken at the same time for 5 days in a row.  Try taking this earlier in the day as it can give you energy.  -Start the muscle relaxer-Zanaflex (tizanidine), up to 3 times daily for muscle spasms and pain.  This can make you drowsy, so take at bedtime or when you do not need to drive or operate machinery. -Continue Tylenol for additional relief. -Heating pad can also help relax the tense muscles. -Follow-up with the provider that does your shoulder injections if symptoms persist.

## 2021-04-27 NOTE — ED Provider Notes (Signed)
[place]    CSN: [id number] Arrival date & time: 04/27/21  1638      History   Chief Complaint Chief Complaint  Patient presents with  . Neck Pain    HPI [name] is a 70 y.o. female presenting for neck and arm pain x1 month, getting worse. Nontraumatic.  Medical history osteopenia, CHF, history of breast cancer.  She has a history of shoulder osteoarthritis, receives joint injections by Ortho for this.  Endorses 1 month of right proximal trapezius pain with pain radiating down her right arm, getting worse.  Denies numbness and tingling, weakness.  Denies injury or overuse.  States she is under a lot of stress.  Denies pain elsewhere.  She is right-handed.    HPI  Past Medical History:  Diagnosis Date  . Breast CA ([place])    twice first on L breast 1982 w/ chest wall involvment, then a second breast ca on the R in 1987  . Chronic systolic CHF (congestive heart failure) ([place])    a. due to Adriamycin,s/p ICD;  b. 01/2013 Gen change and new LV lead - BSX Energen CRT-D BiV ICD, Ser # [id number]  . Depression   . Diverticulitis   . Early menopause    early 85s  . Fatigue 02/20/2016  . Glaucoma suspect of both eyes    Dr. Marshall Cork  . Nonischemic cardiomyopathy ([place])   . OSA (obstructive sleep apnea) 02/07/2016  . Osteopenia   . Renal calculus   . Snoring 02/20/2016    Patient Active Problem List   Diagnosis Date Noted  . TIA (transient ischemic attack) 05/20/2017  . Snoring 02/20/2016  . PCP NOTES >>>>>>>>>>>>>>>>>>> 02/07/2016  . Dysphagia 02/07/2016  . DOE (dyspnea on exertion) 07/03/2014  . LAD (lymphadenopathy) 10/07/2012  . Nonischemic cardiomyopathy ([place]) 09/13/2012  . Obesity (BMI 30-39.9)   . LBBB (left bundle branch block)   . Aortic valve disease   . Annual physical exam 08/17/2012  . Cough 05/10/2009  . Ventricular tachycardia   . History of diverticulosis and diverticulitis   . Insomnia 02/07/2008  . Depression 04/06/2007  .  Chronic systolic heart failure ([place])   . History of breast cancer   . Implantable cardioverter-defibrillator (ICD) in situ     Past Surgical History:  Procedure Laterality Date  . BI-VENTRICULAR IMPLANTABLE CARDIOVERTER DEFIBRILLATOR UPGRADE N/A 02/04/2013   Procedure: BI-VENTRICULAR IMPLANTABLE CARDIOVERTER DEFIBRILLATOR UPGRADE;  Surgeon: Evans Lance, MD;  Location: [facility] CATH LAB;  Service: Cardiovascular;  Laterality: N/A;  . CARDIAC DEFIBRILLATOR PLACEMENT     AICD Replaced-5/09 and 2014  . LEFT AND RIGHT HEART CATHETERIZATION WITH CORONARY ANGIOGRAM N/A 10/06/2013   Procedure: LEFT AND RIGHT HEART CATHETERIZATION WITH CORONARY ANGIOGRAM;  Surgeon: Jolaine Artist, MD;  Location: [facility] CATH LAB;  Service: Cardiovascular;  Laterality: N/A;  . LITHOTRIPSY     h/o several procedures   . MASTECTOMY Bilateral   . OOPHORECTOMY Bilateral   . triger finger, R ring finger Right 02/2019  . VENOGRAM N/A 10/20/2012   Procedure: VENOGRAM;  Surgeon: Deboraha Sprang, MD;  Location: [facility] CATH LAB;  Service: Cardiovascular;  Laterality: N/A;    OB History   No obstetric history on file.      Home Medications    Prior to Admission medications   Medication Sig Start Date End Date Taking? Authorizing Provider  predniSONE (DELTASONE) 20 MG tablet Take 2 tablets (40 mg total) by mouth daily for 5 days. 04/27/21 05/02/21 Yes Phillip Heal,  Sherlon Handing, PA-C  tiZANidine (ZANAFLEX) 2 MG tablet Take 1 tablet (2 mg total) by mouth every 8 (eight) hours as needed for muscle spasms. 04/27/21  Yes Hazel Sams, PA-C  ascorbic acid (VITAMIN C) 1000 MG tablet Take 1,000 mg by mouth daily.    [provider]  atorvastatin (LIPITOR) 40 MG tablet Take 1 tablet (40 mg total) by mouth daily. 10/02/20   Colon Branch, MD  Biotin 5000 MCG CAPS Take 5,000 mcg by mouth daily.     [provider]  carvedilol (COREG) 12.5 MG tablet TAKE ONE TABLET BY MOUTH IN THE MORNING AND AT NIGHT 10/23/20   Bensimhon, Shaune Pascal, MD   clopidogrel (PLAVIX) 75 MG tablet TAKE ONE TABLET BY MOUTH ONE TIME DAILY 01/28/21   Bensimhon, Shaune Pascal, MD  furosemide (LASIX) 40 MG tablet TAKE 2 TABLETS BY MOUTH IN THE MORNING AND 1 TABLET IN THE EVENING 01/28/21   Bensimhon, Shaune Pascal, MD  gabapentin (NEURONTIN) 300 MG capsule Take 300 mg by mouth daily as needed (for shingles flares).     [provider]  multivitamin [facility]) per tablet Take 1 tablet by mouth daily.    [provider]  pantoprazole (PROTONIX) 40 MG tablet Take 1 tablet (40 mg total) by mouth daily. 07/27/20   Colon Branch, MD  potassium chloride (KLOR-CON) 10 MEQ tablet TAKE 3 TABLETS BY MOUTH ONCE A DAY 03/25/21   Bensimhon, Shaune Pascal, MD  Probiotic Product (PROBIOTIC DAILY PO) Take 1 capsule by mouth daily.     [provider]  sacubitril-valsartan (ENTRESTO) 24-26 MG Take 1 tablet by mouth 2 (two) times daily. 09/26/20   Bensimhon, Shaune Pascal, MD  sertraline (ZOLOFT) 25 MG tablet Take 1 tablet (25 mg total) by mouth daily. 03/25/21   Colon Branch, MD  spironolactone (ALDACTONE) 25 MG tablet TAKE ONE TABLET BY MOUTH ONE TIME DAILY 07/09/20   Bensimhon, Shaune Pascal, MD  vitamin B-12 (CYANOCOBALAMIN) 1000 MCG tablet Take 1,000 mcg by mouth daily.    [provider]  zolpidem (AMBIEN) 10 MG tablet Take 0.5-1 tablets (5-10 mg total) by mouth at bedtime as needed for sleep. 10/31/20   Colon Branch, MD    Family History Family History  Problem Relation Age of Onset  . Kidney cancer Brother   . Breast cancer Mother        M and sister   . Lung cancer Father   . Breast cancer Sister   . Heart attack Neg Hx   . Diabetes Neg Hx   . Colon cancer Neg Hx     Social History Social History   Tobacco Use  . Smoking status: Never Smoker  . Smokeless tobacco: Never Used  Vaping Use  . Vaping Use: Never used  Substance Use Topics  . Alcohol use: Yes    Comment: socially/occassionally  . Drug use: No     Allergies   Atacand [candesartan],  Cephalexin, and Latex   Review of Systems Review of Systems  Musculoskeletal:       Right shoulder pain.  All other systems reviewed and are negative.    Physical Exam Triage Vital Signs ED Triage Vitals [04/27/21 1702]  Enc Vitals Group     BP 120/60     Pulse Rate 78     Resp 16     Temp 98.7 F (37.1 C)     Temp Source Oral     SpO2 96 %  Weight      Height      Head Circumference      Peak Flow      Pain Score 10     Pain Loc      Pain Edu?      Excl. in [place]?    No data found.  Updated Vital Signs BP 120/60 (BP Location: Left Arm)   Pulse 78   Temp 98.7 F (37.1 C) (Oral)   Resp 16   SpO2 96%   Visual Acuity Right Eye Distance:   Left Eye Distance:   Bilateral Distance:    Right Eye Near:   Left Eye Near:    Bilateral Near:     Physical Exam Vitals reviewed.  Constitutional:      General: She is not in acute distress.    Appearance: Normal appearance. She is not ill-appearing.  HENT:     Head: Normocephalic and atraumatic.  Cardiovascular:     Rate and Rhythm: Normal rate and regular rhythm.     Heart sounds: Normal heart sounds.  Pulmonary:     Effort: Pulmonary effort is normal.     Breath sounds: Normal breath sounds and air entry.  Abdominal:     Tenderness: There is no abdominal tenderness. There is no right CVA tenderness, left CVA tenderness, guarding or rebound.  Musculoskeletal:     Cervical back: Normal range of motion. No swelling, deformity, signs of trauma, rigidity, spasms, tenderness, bony tenderness or crepitus. No pain with movement.     Thoracic back: No swelling, deformity, signs of trauma, spasms, tenderness or bony tenderness. Normal range of motion. No scoliosis.     Lumbar back: No swelling, deformity, signs of trauma, spasms, tenderness or bony tenderness. Normal range of motion. Negative right straight leg raise test and negative left straight leg raise test. No scoliosis.     Comments: Right proximal trapezius muscle  tenderness to palpation.  Mild but diffuse right shoulder tenderness.  No deltoid tenderness.  Range of motion shoulder intact but pain with abduction against resistance.  Negative Neer, cross body adduction test.  Strength out of 5 in upper and lower extremities, grip strength 4 out of 5.  Sensation intact.  Radial pulse 2+, cap refill less than 2 seconds.  Positive spurling. No bony deformity or effusion. Absolutely no other injury, deformity, tenderness, ecchymosis, abrasion.  Neurological:     General: No focal deficit present.     Mental Status: She is alert.     Cranial Nerves: No cranial nerve deficit.  Psychiatric:        Mood and Affect: Mood normal.        Behavior: Behavior normal.        Thought Content: Thought content normal.        Judgment: Judgment normal.      UC Treatments / Results  Labs (all labs ordered are listed, but only abnormal results are displayed) Labs Reviewed - No data to display  EKG   Radiology No results found.  Procedures Procedures (including critical care time)  Medications Ordered in UC Medications - No data to display  Initial Impression / Assessment and Plan / UC Course  I have reviewed the triage vital signs and the nursing notes.  Pertinent labs & imaging results that were available during my care of the patient were reviewed by me and considered in my medical decision making (see chart for details).     This patient is a 70 year old female presenting  with cervical radiculitis.  Neurovascularly intact.  No red flag symptoms. A1c 5.9 on 08/2020. Prednisone sent as below. zanaflex for additional relief This patient already follows with Ortho for shoulder injections for pre-existing shoulder arthritis, follow-up with them if symptoms persist. ED return precautions discussed.  Final Clinical Impressions(s) / UC Diagnoses   Final diagnoses:  Cervical radiculitis  Primary osteoarthritis of right shoulder     Discharge  Instructions     -Prednisone, 2 pills taken at the same time for 5 days in a row.  Try taking this earlier in the day as it can give you energy.  -Start the muscle relaxer-Zanaflex (tizanidine), up to 3 times daily for muscle spasms and pain.  This can make you drowsy, so take at bedtime or when you do not need to drive or operate machinery. -Continue Tylenol for additional relief. -Heating pad can also help relax the tense muscles. -Follow-up with the provider that does your shoulder injections if symptoms persist.    ED Prescriptions    Medication Sig Dispense Auth. Provider   tiZANidine (ZANAFLEX) 2 MG tablet Take 1 tablet (2 mg total) by mouth every 8 (eight) hours as needed for muscle spasms. 21 tablet Hazel Sams, PA-C   predniSONE (DELTASONE) 20 MG tablet Take 2 tablets (40 mg total) by mouth daily for 5 days. 10 tablet Hazel Sams, PA-C     PDMP not reviewed this encounter.   Hazel Sams, PA-C 04/27/21 [phone]

## 2021-04-27 NOTE — ED Triage Notes (Signed)
Pt present neck and back pain that is radiating down to her right arm. Symptoms started a month for her neck but has progressively gotten worst

## 2021-05-14 ENCOUNTER — Other Ambulatory Visit (HOSPITAL_COMMUNITY): Payer: Self-pay | Admitting: Internal Medicine

## 2021-06-07 ENCOUNTER — Ambulatory Visit (INDEPENDENT_AMBULATORY_CARE_PROVIDER_SITE_OTHER): Payer: Medicare Other

## 2021-06-07 DIAGNOSIS — I472 Ventricular tachycardia: Secondary | ICD-10-CM | POA: Diagnosis not present

## 2021-06-07 DIAGNOSIS — I4729 Other ventricular tachycardia: Secondary | ICD-10-CM

## 2021-06-07 LAB — CUP PACEART REMOTE DEVICE CHECK
Battery Remaining Longevity: 18 mo
Battery Remaining Percentage: 31 %
Brady Statistic RA Percent Paced: 0 %
Brady Statistic RV Percent Paced: 100 %
Date Time Interrogation Session: 20220715005000
HighPow Impedance: 52 Ohm
Implantable Lead Implant Date: 20050701
Implantable Lead Implant Date: 20050701
Implantable Lead Implant Date: 20140314
Implantable Lead Location: 753858
Implantable Lead Location: 753859
Implantable Lead Location: 753860
Implantable Lead Model: 158
Implantable Lead Model: 4196
Implantable Lead Model: 5076
Implantable Lead Serial Number: 156416
Implantable Pulse Generator Implant Date: 20140314
Lead Channel Impedance Value: 441 Ohm
Lead Channel Impedance Value: 468 Ohm
Lead Channel Impedance Value: 518 Ohm
Lead Channel Pacing Threshold Amplitude: 0.6 V
Lead Channel Pacing Threshold Amplitude: 0.7 V
Lead Channel Pacing Threshold Amplitude: 0.9 V
Lead Channel Pacing Threshold Pulse Width: 0.4 ms
Lead Channel Pacing Threshold Pulse Width: 0.4 ms
Lead Channel Pacing Threshold Pulse Width: 0.6 ms
Lead Channel Setting Pacing Amplitude: 2 V
Lead Channel Setting Pacing Amplitude: 2 V
Lead Channel Setting Pacing Amplitude: 2 V
Lead Channel Setting Pacing Pulse Width: 0.4 ms
Lead Channel Setting Pacing Pulse Width: 0.6 ms
Lead Channel Setting Sensing Sensitivity: 0.5 mV
Lead Channel Setting Sensing Sensitivity: 1 mV
Pulse Gen Serial Number: 111235

## 2021-06-24 DIAGNOSIS — M542 Cervicalgia: Secondary | ICD-10-CM | POA: Insufficient documentation

## 2021-06-26 ENCOUNTER — Other Ambulatory Visit: Payer: Self-pay | Admitting: Orthopedic Surgery

## 2021-06-26 DIAGNOSIS — M542 Cervicalgia: Secondary | ICD-10-CM

## 2021-06-29 ENCOUNTER — Ambulatory Visit
Admission: RE | Admit: 2021-06-29 | Discharge: 2021-06-29 | Disposition: A | Discharge status: Home / Self Care | Payer: Medicare Other | Source: Ambulatory Visit | Attending: Orthopedic Surgery | Admitting: Orthopedic Surgery

## 2021-06-29 ENCOUNTER — Other Ambulatory Visit: Payer: Self-pay

## 2021-06-29 DIAGNOSIS — M4322 Fusion of spine, cervical region: Secondary | ICD-10-CM | POA: Diagnosis not present

## 2021-06-29 DIAGNOSIS — M4802 Spinal stenosis, cervical region: Secondary | ICD-10-CM | POA: Diagnosis not present

## 2021-06-29 DIAGNOSIS — M47812 Spondylosis without myelopathy or radiculopathy, cervical region: Secondary | ICD-10-CM | POA: Diagnosis not present

## 2021-06-29 DIAGNOSIS — M419 Scoliosis, unspecified: Secondary | ICD-10-CM | POA: Diagnosis not present

## 2021-06-29 DIAGNOSIS — M542 Cervicalgia: Secondary | ICD-10-CM

## 2021-07-01 NOTE — Progress Notes (Signed)
Remote ICD transmission.   

## 2021-07-18 ENCOUNTER — Other Ambulatory Visit: Payer: Self-pay | Admitting: Internal Medicine

## 2021-07-18 ENCOUNTER — Other Ambulatory Visit (HOSPITAL_COMMUNITY): Payer: Self-pay | Admitting: Internal Medicine

## 2021-07-22 DIAGNOSIS — M542 Cervicalgia: Secondary | ICD-10-CM | POA: Diagnosis not present

## 2021-07-24 DIAGNOSIS — M19012 Primary osteoarthritis, left shoulder: Secondary | ICD-10-CM | POA: Insufficient documentation

## 2021-07-24 DIAGNOSIS — M25511 Pain in right shoulder: Secondary | ICD-10-CM | POA: Diagnosis not present

## 2021-07-24 DIAGNOSIS — M542 Cervicalgia: Secondary | ICD-10-CM | POA: Diagnosis not present

## 2021-07-24 DIAGNOSIS — M1811 Unilateral primary osteoarthritis of first carpometacarpal joint, right hand: Secondary | ICD-10-CM | POA: Diagnosis not present

## 2021-08-03 ENCOUNTER — Ambulatory Visit (INDEPENDENT_AMBULATORY_CARE_PROVIDER_SITE_OTHER): Payer: Medicare Other

## 2021-08-03 VITALS — Ht 64.0 in | Wt 177.0 lb

## 2021-08-03 DIAGNOSIS — Z0000 Encounter for general adult medical examination without abnormal findings: Secondary | ICD-10-CM | POA: Diagnosis not present

## 2021-08-03 DIAGNOSIS — Z780 Asymptomatic menopausal state: Secondary | ICD-10-CM | POA: Diagnosis not present

## 2021-08-03 NOTE — Progress Notes (Addendum)
Subjective:   [name] is a 70 y.o. female who presents for Medicare Annual (Subsequent) preventive examination.  Virtual Visit via Telephone Note  I connected with  [name] on 08/03/21 at 10:40 AM EDT by telephone and verified that I am speaking with the correct person using two identifiers.  Location: Patient: Home Provider: [facility] Persons participating in the virtual visit: patient/Nurse Health Advisor   I discussed the limitations, risks, security and privacy concerns of performing an evaluation and management service by telephone and the availability of in person appointments. The patient expressed understanding and agreed to proceed.  Interactive audio and video telecommunications were attempted between this nurse and patient, however failed, due to patient having technical difficulties OR patient did not have access to video capability.  We continued and completed visit with audio only.  Some vital signs may be absent or patient reported.   Mailyn Steichen E Mina Carlisi, LPN   Review of Systems     Cardiac Risk Factors include: advanced age (>66mn, >>64women);obesity (BMI >30kg/m2);sedentary lifestyle;Other (see comment), Risk factor comments: hx of TIA, Chronic systolic heart failure, ventricular tachycardia     Objective:    Today's Vitals   08/03/21 1046  Weight: 177 lb (80.3 kg)  Height: '5\' 4"'$  (1.626 m)  PainSc: 5    Body mass index is 30.38 kg/m.  Advanced Directives 08/03/2021 06/02/2019 11/20/2015 10/06/2013 02/04/2013 10/20/2012  Does Patient Have a Medical Advance Directive? No No No Patient does not have advance directive Patient does not have advance directive Patient does not have advance directive  Would patient like information on creating a medical advance directive? No - Patient declined Yes (MAU/Ambulatory/Procedural Areas - Information given) Yes - Educational materials given - - -  Pre-existing out of facility DNR order (yellow form or pink MOST  form) - - - No No No    Current Medications (verified) Outpatient Encounter Medications as of 08/03/2021  Medication Sig   ascorbic acid (VITAMIN C) 1000 MG tablet Take 1,000 mg by mouth daily.   atorvastatin (LIPITOR) 40 MG tablet Take 1 tablet (40 mg total) by mouth daily.   Biotin 5000 MCG CAPS Take 5,000 mcg by mouth daily.    carvedilol (COREG) 12.5 MG tablet take 1 tablet by mouth in the morning and 1 tablet at night   clopidogrel (PLAVIX) 75 MG tablet TAKE ONE TABLET BY MOUTH ONE TIME DAILY   furosemide (LASIX) 40 MG tablet TAKE TWO TABLETS BY MOUTH IN THE MORNING and 1 tablet in the evening   gabapentin (NEURONTIN) 300 MG capsule Take 300 mg by mouth daily as needed (for shingles flares).    methocarbamol (ROBAXIN) 500 MG tablet Robaxin 500 mg tablet  Take 1 tablet every 6-8 hours by oral route for 10 days.   multivitamin (THERAGRAN) per tablet Take 1 tablet by mouth daily.   pantoprazole (PROTONIX) 40 MG tablet TAKE ONE TABLET BY MOUTH ONE TIME DAILY   potassium chloride (KLOR-CON) 10 MEQ tablet TAKE 3 TABLETS BY MOUTH ONCE A DAY   Probiotic Product (PROBIOTIC DAILY PO) Take 1 capsule by mouth daily.    sacubitril-valsartan (ENTRESTO) 24-26 MG Take 1 tablet by mouth 2 (two) times daily.   sertraline (ZOLOFT) 25 MG tablet Take 1 tablet (25 mg total) by mouth daily.   spironolactone (ALDACTONE) 25 MG tablet TAKE ONE TABLET BY MOUTH ONE TIME DAILY   zolpidem (AMBIEN) 10 MG tablet Take 0.5-1 tablets (5-10 mg total) by mouth at bedtime as needed  for sleep.   tiZANidine (ZANAFLEX) 2 MG tablet Take 1 tablet (2 mg total) by mouth every 8 (eight) hours as needed for muscle spasms. (Patient not taking: Reported on 08/03/2021)   vitamin B-12 (CYANOCOBALAMIN) 1000 MCG tablet Take 1,000 mcg by mouth daily. (Patient not taking: Reported on 08/03/2021)   No facility-administered encounter medications on file as of 08/03/2021.    Allergies (verified) Atacand [candesartan], Cephalexin, and Latex    History: Past Medical History:  Diagnosis Date   Breast CA ([facility])    twice first on L breast 1982 w/ chest wall involvment, then a second breast ca on the R in [id number]   Chronic systolic CHF (congestive heart failure) (HCC)    a. due to Adriamycin,s/p ICD;  b. 01/2013 Gen change and new LV lead - BSX Energen CRT-D BiV ICD, Ser # [id number]   Depression    Diverticulitis    Early menopause    early 30s   Fatigue 02/20/2016   Glaucoma suspect of both eyes    Dr. Marshall Cork   Nonischemic cardiomyopathy [facility])    OSA (obstructive sleep apnea) 02/07/2016   Osteopenia    Renal calculus    Snoring 02/20/2016   Past Surgical History:  Procedure Laterality Date   BI-VENTRICULAR IMPLANTABLE CARDIOVERTER DEFIBRILLATOR UPGRADE N/A 02/04/2013   Procedure: BI-VENTRICULAR IMPLANTABLE CARDIOVERTER DEFIBRILLATOR UPGRADE;  Surgeon: Evans Lance, MD;  Location: [facility] CATH LAB;  Service: Cardiovascular;  Laterality: N/A;   CARDIAC DEFIBRILLATOR PLACEMENT     AICD Replaced-5/09 and 2014   LEFT AND RIGHT HEART CATHETERIZATION WITH CORONARY ANGIOGRAM N/A 10/06/2013   Procedure: LEFT AND RIGHT HEART CATHETERIZATION WITH CORONARY ANGIOGRAM;  Surgeon: Jolaine Artist, MD;  Location: [facility] CATH LAB;  Service: Cardiovascular;  Laterality: N/A;   LITHOTRIPSY     h/o several procedures    MASTECTOMY Bilateral    OOPHORECTOMY Bilateral    triger finger, R ring finger Right 02/2019   VENOGRAM N/A 10/20/2012   Procedure: VENOGRAM;  Surgeon: Deboraha Sprang, MD;  Location: [facility] CATH LAB;  Service: Cardiovascular;  Laterality: N/A;   Family History  Problem Relation Age of Onset   Kidney cancer Brother    Breast cancer Mother        M and sister    Lung cancer Father    Breast cancer Sister    Heart attack Neg Hx    Diabetes Neg Hx    Colon cancer Neg Hx    Social History   Socioeconomic History   Marital status: Married    Spouse name: Enriqueta Shutter   Number of children: 3   Years of education: Not on file    Highest education level: Not on file  Occupational History   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  Tobacco Use   Smoking status: Never   Smokeless tobacco: Never  Vaping Use   Vaping Use: Never used  Substance and Sexual Activity   Alcohol use: Yes    Comment: socially/occassionally   Drug use: No   Sexual activity: Not on file  Other Topics Concern   Not on file  Social History Narrative   Related to Mr and Mrs Huston Foley, they are my patients as well    Married, 3 children all in [place], 7 Gkids     Siblings, 2 deceased, 2 living brothers    Social Determinants of Health   Financial Resource Strain: Low Risk    Difficulty  of Paying Living Expenses: Not hard at all  Food Insecurity: No Food Insecurity   Worried About [place] in the Last Year: Never true   [place] in the Last Year: Never true  Transportation Needs: No Transportation Needs   Lack of Transportation (Medical): No   Lack of Transportation (Non-Medical): No  Physical Activity: Sufficiently Active   Days of Exercise per Week: 5 days   Minutes of Exercise per Session: 30 min  Stress: No Stress Concern Present   Feeling of Stress : Not at all  Social Connections: Socially Integrated   Frequency of Communication with Friends and Family: More than three times a week   Frequency of Social Gatherings with Friends and Family: More than three times a week   Attends Religious Services: More than 4 times per year   Active Member of [employer] or Organizations: Yes   Attends Music therapist: Not on file   Marital Status: Married    Tobacco Counseling Counseling given: Not Answered   Clinical Intake:  Pre-visit preparation completed: Yes  Pain : 0-10 Pain Score: 5  Pain Type: Chronic pain Pain Location: Neck Pain Orientation: Left Pain Descriptors / Indicators: Aching, Sore, Discomfort, Tightness Pain Onset: More than a month  ago Pain Frequency: Intermittent     BMI - recorded: 30.38 Nutritional Status: BMI > 30  Obese Nutritional Risks: None Diabetes: No  How often do you need to have someone help you when you read instructions, pamphlets, or other written materials from your doctor or pharmacy?: 1 - Never  Diabetic? No  Interpreter Needed?: No  Information entered by :: Nelwyn Hebdon, LPN   Activities of Daily Living In your present state of health, do you have any difficulty performing the following activities: 08/03/2021 08/28/2020  Hearing? N N  Vision? N N  Difficulty concentrating or making decisions? N N  Walking or climbing stairs? N N  Dressing or bathing? N N  Doing errands, shopping? N N  Preparing Food and eating ? N -  Using the Toilet? N -  In the past six months, have you accidently leaked urine? Y -  Comment mild -  Do you have problems with loss of bowel control? N -  Managing your Medications? N -  Managing your Finances? N -  Housekeeping or managing your Housekeeping? N -  Some recent data might be hidden    Patient Care Team: Colon Branch, MD as PCP - General Bensimhon, Shaune Pascal, MD as Consulting Physician (Cardiology) Hortencia Pilar, MD as Consulting Physician (Ophthalmology) Rod Can, MD as Consulting Physician (Orthopedic Surgery)  Indicate any recent Medical Services you may have received from other than [facility] providers in the past year (date may be approximate).     Assessment:   This is a routine wellness examination for [name].  Hearing/Vision screen Hearing Screening - Comments:: Denies hearing difficulties  Vision Screening - Comments:: Wears eyeglasses - up to date with annual eye exams with Dr Kathlen Mody  Dietary issues and exercise activities discussed: Current Exercise Habits: Home exercise routine, Type of exercise: walking, Time (Minutes): 30, Frequency (Times/Week): 5, Weekly Exercise (Minutes/Week): 150, Intensity: Mild, Exercise limited by:  cardiac condition(s);orthopedic condition(s)   Goals Addressed             This Visit's Progress    Maintain current health   On track      Depression Screen [facility] 2/9 Scores 08/03/2021 01/30/2021 02/27/2020 07/12/2019 06/02/2019 09/23/2018  08/05/2017  PHQ - 2 Score 0 0 2 0 0 0 0  PHQ- 9 Score - 0 6 0 - 0 0    Fall Risk Fall Risk  08/03/2021 08/28/2020 07/12/2019 06/02/2019 09/23/2018  Falls in the past year? 0 0 0 0 No  Number falls in past yr: 0 0 0 - -  Injury with Fall? 0 0 0 - -  Risk for fall due to : Medication side effect;Other (Comment) - - - -  Follow up Falls prevention discussed Falls evaluation completed - - -    FALL RISK PREVENTION PERTAINING TO THE HOME:  Any stairs in or around the home? Yes  If so, are there any without handrails? No  Home free of loose throw rugs in walkways, pet beds, electrical cords, etc? Yes  Adequate lighting in your home to reduce risk of falls? Yes   ASSISTIVE DEVICES UTILIZED TO PREVENT FALLS:  Life alert? Yes  Use of a cane, walker or w/c? No  Grab bars in the bathroom? Yes  Shower chair or bench in shower? No  Elevated toilet seat or a handicapped toilet? Yes   TIMED UP AND GO:  Was the test performed? No . Telephonic visit  Cognitive Function: Normal cognitive status assessed by direct observation by this Nurse Health Advisor. No abnormalities found.          Immunizations Immunization History  Administered Date(s) Administered   Fluad Quad(high Dose 65+) 08/02/2019   Influenza Split 11/06/2011, 08/17/2012   Influenza Whole 10/01/2007, 08/29/2008, 09/21/2009, 08/12/2010   Influenza, High Dose Seasonal PF 08/05/2017, 09/23/2018   Influenza,inj,Quad PF,6+ Mos 11/10/2013, 09/11/2014   Influenza-Unspecified 09/25/2015, 09/24/2020   PFIZER(Purple Top)SARS-COV-2 Vaccination 01/06/2020, 01/29/2020, 10/31/2020   Pneumococcal Conjugate-13 09/11/2014   Pneumococcal Polysaccharide-23 03/14/2002, 10/01/2007, 07/02/2016   Td 03/14/2002    Tdap 08/17/2012   Zoster, Live 11/19/2012    TDAP status: Up to date  Flu Vaccine status: Up to date  Pneumococcal vaccine status: Up to date  Covid-19 vaccine status: Completed vaccines  Qualifies for Shingles Vaccine? Yes   Zostavax completed Yes   Shingrix Completed?: No.    Education has been provided regarding the importance of this vaccine. Patient has been advised to call insurance company to determine out of pocket expense if they have not yet received this vaccine. Advised may also receive vaccine at local pharmacy or Health Dept. Verbalized acceptance and understanding.  Screening Tests Health Maintenance  Topic Date Due   Zoster Vaccines- Shingrix (1 of 2) Never done   COVID-19 Vaccine (4 - Booster for Pfizer series) 01/23/2021   INFLUENZA VACCINE  06/24/2021   COLONOSCOPY (Pts 45-16yr Insurance coverage will need to be confirmed)  12/24/2021   TETANUS/TDAP  08/17/2022   DEXA SCAN  Completed   Hepatitis C Screening  Completed   PNA vac Low Risk Adult  Completed   HPV VACCINES  Aged Out    Health Maintenance  Health Maintenance Due  Topic Date Due   Zoster Vaccines- Shingrix (1 of 2) Never done   COVID-19 Vaccine (4 - Booster for Pfizer series) 01/23/2021   INFLUENZA VACCINE  06/24/2021    Colorectal cancer screening: Type of screening: Colonoscopy. Completed 12/25/2011. Repeat every 10 years  Mammogram status: No longer required due to bilateral mastectomy.  Bone Density status: Completed 06/06/2019. Results reflect: Bone density results: OSTEOPENIA. Repeat every 2 years.  Lung Cancer Screening: (Low Dose CT Chest recommended if Age 72-80years, 30 pack-year currently smoking OR have quit  w/in 15years.) does not qualify.   Additional Screening:  Hepatitis C Screening: does qualify; Completed 01/23/2017  Vision Screening: Recommended annual ophthalmology exams for early detection of glaucoma and other disorders of the eye. Is the patient up to date with  their annual eye exam?  Yes  Who is the provider or what is the name of the office in which the patient attends annual eye exams? Kathlen Mody If pt is not established with a provider, would they like to be referred to a provider to establish care? No .   Dental Screening: Recommended annual dental exams for proper oral hygiene  Community Resource Referral / Chronic Care Management: CRR required this visit?  No   CCM required this visit?  No      Plan:     I have personally reviewed and noted the following in the patient's chart:   Medical and social history Use of alcohol, tobacco or illicit drugs  Current medications and supplements including opioid prescriptions.  Functional ability and status Nutritional status Physical activity Advanced directives List of other physicians Hospitalizations, surgeries, and ER visits in previous 12 months Vitals Screenings to include cognitive, depression, and falls Referrals and appointments  In addition, I have reviewed and discussed with patient certain preventive protocols, quality metrics, and best practice recommendations. A written personalized care plan for preventive services as well as general preventive health recommendations were provided to patient.     Sandrea Hammond, LPN   [id number]   Nurse Notes: Having a lot of neck pain and trouble sleeping - she is seeing ortho and I made her a routine visit here for next month.   I have reviewed and agree with Health Coaches documentation.  Kathlene November, MD

## 2021-08-03 NOTE — Patient Instructions (Signed)
Ms. Rowton , Thank you for taking time to come for your Medicare Wellness Visit. I appreciate your ongoing commitment to your health goals. Please review the following plan we discussed and let me know if I can assist you in the future.   Screening recommendations/referrals: Colonoscopy: Done 12/25/2011 - Repeat in 10 years Mammogram: No longer required Bone Density: Done 06/06/2019 - Repeat every 2 years *due Recommended yearly ophthalmology/optometry visit for glaucoma screening and checkup Recommended yearly dental visit for hygiene and checkup  Vaccinations: Influenza vaccine: Done 09/24/2020 -Repeat annually  Pneumococcal vaccine: Done 09/11/2014 & 07/02/2016 Tdap vaccine: Done 08/17/2012 - Repeat in 10 years Shingles vaccine: Zostavax done 2013 - due for shingrix   Covid-19: Done 01/06/20, 01/29/20, & 10/31/2020 - due for second booster  Advanced directives: Advance directive discussed with you today. Even though you declined this today, please call our office should you change your mind, and we can give you the proper paperwork for you to fill out.   Conditions/risks identified: Aim for 30 minutes of exercise or brisk walking each day, drink 6-8 glasses of water and eat lots of fruits and vegetables.   Next appointment: Follow up in one year for your annual wellness visit    Preventive Care 65 Years and Older, Female Preventive care refers to lifestyle choices and visits with your health care provider that can promote health and wellness. What does preventive care include? A yearly physical exam. This is also called an annual well check. Dental exams once or twice a year. Routine eye exams. Ask your health care provider how often you should have your eyes checked. Personal lifestyle choices, including: Daily care of your teeth and gums. Regular physical activity. Eating a healthy diet. Avoiding tobacco and drug use. Limiting alcohol use. Practicing safe sex. Taking low-dose  aspirin every day. Taking vitamin and mineral supplements as recommended by your health care provider. What happens during an annual well check? The services and screenings done by your health care provider during your annual well check will depend on your age, overall health, lifestyle risk factors, and family history of disease. Counseling  Your health care provider may ask you questions about your: Alcohol use. Tobacco use. Drug use. Emotional well-being. Home and relationship well-being. Sexual activity. Eating habits. History of falls. Memory and ability to understand (cognition). Work and work Statistician. Reproductive health. Screening  You may have the following tests or measurements: Height, weight, and BMI. Blood pressure. Lipid and cholesterol levels. These may be checked every 5 years, or more frequently if you are over 36 years old. Skin check. Lung cancer screening. You may have this screening every year starting at age 47 if you have a 30-pack-year history of smoking and currently smoke or have quit within the past 15 years. Fecal occult blood test (FOBT) of the stool. You may have this test every year starting at age 62. Flexible sigmoidoscopy or colonoscopy. You may have a sigmoidoscopy every 5 years or a colonoscopy every 10 years starting at age 63. Hepatitis C blood test. Hepatitis B blood test. Sexually transmitted disease (STD) testing. Diabetes screening. This is done by checking your blood sugar (glucose) after you have not eaten for a while (fasting). You may have this done every 1-3 years. Bone density scan. This is done to screen for osteoporosis. You may have this done starting at age 75. Mammogram. This may be done every 1-2 years. Talk to your health care provider about how often you should have regular  mammograms. Talk with your health care provider about your test results, treatment options, and if necessary, the need for more tests. Vaccines  Your  health care provider may recommend certain vaccines, such as: Influenza vaccine. This is recommended every year. Tetanus, diphtheria, and acellular pertussis (Tdap, Td) vaccine. You may need a Td booster every 10 years. Zoster vaccine. You may need this after age 82. Pneumococcal 13-valent conjugate (PCV13) vaccine. One dose is recommended after age 19. Pneumococcal polysaccharide (PPSV23) vaccine. One dose is recommended after age 40. Talk to your health care provider about which screenings and vaccines you need and how often you need them. This information is not intended to replace advice given to you by your health care provider. Make sure you discuss any questions you have with your health care provider. Document Released: 12/07/2015 Document Revised: 07/30/2016 Document Reviewed: 09/11/2015 Elsevier Interactive Patient Education  2017 [place] Prevention in the Home Falls can cause injuries. They can happen to people of all ages. There are many things you can do to make your home safe and to help prevent falls. What can I do on the outside of my home? Regularly fix the edges of walkways and driveways and fix any cracks. Remove anything that might make you trip as you walk through a door, such as a raised step or threshold. Trim any bushes or trees on the path to your home. Use bright outdoor lighting. Clear any walking paths of anything that might make someone trip, such as rocks or tools. Regularly check to see if handrails are loose or broken. Make sure that both sides of any steps have handrails. Any raised decks and porches should have guardrails on the edges. Have any leaves, snow, or ice cleared regularly. Use sand or salt on walking paths during winter. Clean up any spills in your garage right away. This includes oil or grease spills. What can I do in the bathroom? Use night lights. Install grab bars by the toilet and in the tub and shower. Do not use towel bars as  grab bars. Use non-skid mats or decals in the tub or shower. If you need to sit down in the shower, use a plastic, non-slip stool. Keep the floor dry. Clean up any water that spills on the floor as soon as it happens. Remove soap buildup in the tub or shower regularly. Attach bath mats securely with double-sided non-slip rug tape. Do not have throw rugs and other things on the floor that can make you trip. What can I do in the bedroom? Use night lights. Make sure that you have a light by your bed that is easy to reach. Do not use any sheets or blankets that are too big for your bed. They should not hang down onto the floor. Have a firm chair that has side arms. You can use this for support while you get dressed. Do not have throw rugs and other things on the floor that can make you trip. What can I do in the kitchen? Clean up any spills right away. Avoid walking on wet floors. Keep items that you use a lot in easy-to-reach places. If you need to reach something above you, use a strong step stool that has a grab bar. Keep electrical cords out of the way. Do not use floor polish or wax that makes floors slippery. If you must use wax, use non-skid floor wax. Do not have throw rugs and other things on the floor that can  make you trip. What can I do with my stairs? Do not leave any items on the stairs. Make sure that there are handrails on both sides of the stairs and use them. Fix handrails that are broken or loose. Make sure that handrails are as long as the stairways. Check any carpeting to make sure that it is firmly attached to the stairs. Fix any carpet that is loose or worn. Avoid having throw rugs at the top or bottom of the stairs. If you do have throw rugs, attach them to the floor with carpet tape. Make sure that you have a light switch at the top of the stairs and the bottom of the stairs. If you do not have them, ask someone to add them for you. What else can I do to help prevent  falls? Wear shoes that: Do not have high heels. Have rubber bottoms. Are comfortable and fit you well. Are closed at the toe. Do not wear sandals. If you use a stepladder: Make sure that it is fully opened. Do not climb a closed stepladder. Make sure that both sides of the stepladder are locked into place. Ask someone to hold it for you, if possible. Clearly mark and make sure that you can see: Any grab bars or handrails. First and last steps. Where the edge of each step is. Use tools that help you move around (mobility aids) if they are needed. These include: Canes. Walkers. Scooters. Crutches. Turn on the lights when you go into a dark area. Replace any light bulbs as soon as they burn out. Set up your furniture so you have a clear path. Avoid moving your furniture around. If any of your floors are uneven, fix them. If there are any pets around you, be aware of where they are. Review your medicines with your doctor. Some medicines can make you feel dizzy. This can increase your chance of falling. Ask your doctor what other things that you can do to help prevent falls. This information is not intended to replace advice given to you by your health care provider. Make sure you discuss any questions you have with your health care provider. Document Released: 09/06/2009 Document Revised: 04/17/2016 Document Reviewed: 12/15/2014 Elsevier Interactive Patient Education  2017 [employer]   Osteopenia Osteopenia is a loss of thickness (density) inside the bones. Another name for osteopenia is low bone mass. Mild osteopenia is a normal part of aging. It is not a disease, and it does not cause symptoms. However, if you have osteopenia and continue to lose bone mass, you could develop a condition that causes the bones to become thin and break more easily (osteoporosis). Osteoporosis can cause you to lose some height, have back pain, and have a stooped posture. Although osteopenia is not a  disease, making changes to your lifestyle and diet can help to prevent osteopenia from developing into osteoporosis. What are the causes? Osteopenia is caused by loss of calcium in the bones. Bones are constantly changing. Old bone cells are continually being replaced with new bone cells. This process builds new bone. The mineral calcium is needed to build new bone and maintain bone density. Bone density is usually highest around age 71. After that, most people's bodies cannot replace all the bone they have lost with new bone. What increases the risk? You are more likely to develop this condition if: You are older than age 21. You are a woman who went through menopause early. You have a long illness that  keeps you in bed. You do not get enough exercise. You lack certain nutrients (malnutrition). You have an overactive thyroid gland (hyperthyroidism). You use products that contain nicotine or tobacco, such as cigarettes, e-cigarettes and chewing tobacco, or you drink a lot of alcohol. You are taking medicines that weaken the bones, such as steroids. What are the signs or symptoms? This condition does not cause any symptoms. You may have a slightly higher risk for bone breaks (fractures), so getting fractures more easily than normal may be an indication of osteopenia. How is this diagnosed? This condition may be diagnosed based on an X-ray exam that measures bone density (dual-energy X-ray absorptiometry, or DEXA). This test can measure bone density in your hips, spine, and wrists. Osteopenia has no symptoms, so this condition is usually diagnosed after a routine bone density screening test is done for osteoporosis. This routine screening is usually done for: Women who are age 60 or older. Men who are age 20 or older. If you have risk factors for osteopenia, you may have the screening test at an earlier age. How is this treated? Making dietary and lifestyle changes can lower your risk for  osteoporosis. If you have severe osteopenia that is close to becoming osteoporosis, this condition can be treated with medicines and dietary supplements such as calcium and vitamin D. These supplements help to rebuild bone density. Follow these instructions at home: Eating and drinking Eat a diet that is high in calcium and vitamin D. Calcium is found in dairy products, beans, salmon, and leafy green vegetables like spinach and broccoli. Look for foods that have vitamin D and calcium added to them (fortified foods), such as orange juice, cereal, and bread.  Lifestyle Do 30 minutes or more of a weight-bearing exercise every day, such as walking, jogging, or playing a sport. These types of exercises strengthen the bones. Do not use any products that contain nicotine or tobacco, such as cigarettes, e-cigarettes, and chewing tobacco. If you need help quitting, ask your health care provider. Do not drink alcohol if: Your health care provider tells you not to drink. You are pregnant, may be pregnant, or are planning to become pregnant. If you drink alcohol: Limit how much you use to: 0-1 drink a day for women. 0-2 drinks a day for men. Be aware of how much alcohol is in your drink. In the [place] one drink equals one 12 oz bottle of beer (355 mL), one 5 oz glass of wine (148 mL), or one 1 oz glass of hard liquor (44 mL). General instructions Take over-the-counter and prescription medicines only as told by your health care provider. These include vitamins and supplements. Take precautions at home to lower your risk of falling, such as: Keeping rooms well-lit and free of clutter, such as cords. Installing safety rails on stairs. Using rubber mats in the bathroom or other areas that are often wet or slippery. Keep all follow-up visits. This is important. Contact a health care provider if: You have not had a bone density screening for osteoporosis and you are: A woman who is age 21 or older. A man  who is age 78 or older. You are a postmenopausal woman who has not had a bone density screening for osteoporosis. You are older than age 19 and you want to know if you should have bone density screening for osteoporosis. Summary Osteopenia is a loss of thickness (density) inside the bones. Another name for osteopenia is low bone mass. Osteopenia is not a  disease, but it may increase your risk for a condition that causes the bones to become thin and break more easily (osteoporosis). You may be at risk for osteopenia if you are older than age 49 or if you are a woman who went through early menopause. Osteopenia does not cause any symptoms, but it can be diagnosed with a bone density screening test. Dietary and lifestyle changes are the first treatment for osteopenia. These may lower your risk for osteoporosis. This information is not intended to replace advice given to you by your health care provider. Make sure you discuss any questions you have with your health care provider. Document Revised: 04/26/2020 Document Reviewed: 04/26/2020 Elsevier Patient Education  [place].

## 2021-08-08 ENCOUNTER — Telehealth: Payer: Self-pay | Admitting: Internal Medicine

## 2021-08-09 NOTE — Telephone Encounter (Signed)
PDMP okay, Rx sent 

## 2021-08-09 NOTE — Telephone Encounter (Signed)
Requesting:Ambien '10mg'$  Contract: None UDS: None Last Visit: 01/30/2021 Next Visit: 09/03/2021 Last Refill: 10/31/2020 #[id number] and 1RF  Please Advise

## 2021-08-13 ENCOUNTER — Other Ambulatory Visit: Payer: Self-pay

## 2021-08-13 ENCOUNTER — Ambulatory Visit (HOSPITAL_BASED_OUTPATIENT_CLINIC_OR_DEPARTMENT_OTHER)
Admission: RE | Admit: 2021-08-13 | Discharge: 2021-08-13 | Disposition: A | Discharge status: Home / Self Care | Payer: Medicare Other | Source: Ambulatory Visit | Attending: Internal Medicine | Admitting: Internal Medicine

## 2021-08-13 DIAGNOSIS — Z780 Asymptomatic menopausal state: Secondary | ICD-10-CM | POA: Insufficient documentation

## 2021-08-13 DIAGNOSIS — M85851 Other specified disorders of bone density and structure, right thigh: Secondary | ICD-10-CM | POA: Diagnosis not present

## 2021-08-20 ENCOUNTER — Other Ambulatory Visit: Payer: Self-pay

## 2021-08-20 ENCOUNTER — Ambulatory Visit (HOSPITAL_BASED_OUTPATIENT_CLINIC_OR_DEPARTMENT_OTHER)
Admission: RE | Admit: 2021-08-20 | Discharge: 2021-08-20 | Disposition: A | Discharge status: Home / Self Care | Payer: Medicare Other | Source: Ambulatory Visit | Attending: Internal Medicine | Admitting: Internal Medicine

## 2021-08-20 ENCOUNTER — Ambulatory Visit (HOSPITAL_COMMUNITY)
Admission: RE | Admit: 2021-08-20 | Discharge: 2021-08-20 | Disposition: A | Discharge status: Home / Self Care | Payer: Medicare Other | Source: Ambulatory Visit | Attending: Internal Medicine | Admitting: Internal Medicine

## 2021-08-20 ENCOUNTER — Encounter (HOSPITAL_COMMUNITY): Payer: Self-pay | Admitting: Internal Medicine

## 2021-08-20 VITALS — BP 152/57 | HR 67 | Wt 181.0 lb

## 2021-08-20 DIAGNOSIS — Z853 Personal history of malignant neoplasm of breast: Secondary | ICD-10-CM | POA: Insufficient documentation

## 2021-08-20 DIAGNOSIS — G4733 Obstructive sleep apnea (adult) (pediatric): Secondary | ICD-10-CM | POA: Insufficient documentation

## 2021-08-20 DIAGNOSIS — I5022 Chronic systolic (congestive) heart failure: Secondary | ICD-10-CM

## 2021-08-20 DIAGNOSIS — I351 Nonrheumatic aortic (valve) insufficiency: Secondary | ICD-10-CM | POA: Diagnosis not present

## 2021-08-20 DIAGNOSIS — Z95810 Presence of automatic (implantable) cardiac defibrillator: Secondary | ICD-10-CM

## 2021-08-20 DIAGNOSIS — Z79899 Other long term (current) drug therapy: Secondary | ICD-10-CM | POA: Insufficient documentation

## 2021-08-20 DIAGNOSIS — Z7902 Long term (current) use of antithrombotics/antiplatelets: Secondary | ICD-10-CM | POA: Insufficient documentation

## 2021-08-20 DIAGNOSIS — I340 Nonrheumatic mitral (valve) insufficiency: Secondary | ICD-10-CM | POA: Diagnosis not present

## 2021-08-20 DIAGNOSIS — I428 Other cardiomyopathies: Secondary | ICD-10-CM | POA: Insufficient documentation

## 2021-08-20 DIAGNOSIS — Z8673 Personal history of transient ischemic attack (TIA), and cerebral infarction without residual deficits: Secondary | ICD-10-CM | POA: Insufficient documentation

## 2021-08-20 DIAGNOSIS — Z8249 Family history of ischemic heart disease and other diseases of the circulatory system: Secondary | ICD-10-CM | POA: Diagnosis not present

## 2021-08-20 DIAGNOSIS — Z881 Allergy status to other antibiotic agents status: Secondary | ICD-10-CM | POA: Diagnosis not present

## 2021-08-20 DIAGNOSIS — Z9221 Personal history of antineoplastic chemotherapy: Secondary | ICD-10-CM | POA: Insufficient documentation

## 2021-08-20 LAB — CBC
HCT: 38.9 % (ref 36.0–46.0)
Hemoglobin: 12.6 g/dL (ref 12.0–15.0)
MCH: 30.4 pg (ref 26.0–34.0)
MCHC: 32.4 g/dL (ref 30.0–36.0)
MCV: 93.7 fL (ref 80.0–100.0)
Platelets: 301 10*3/uL (ref 150–400)
RBC: 4.15 MIL/uL (ref 3.87–5.11)
RDW: 13.1 % (ref 11.5–15.5)
WBC: 5.8 10*3/uL (ref 4.0–10.5)
nRBC: 0 % (ref 0.0–0.2)

## 2021-08-20 LAB — COMPREHENSIVE METABOLIC PANEL
ALT: 27 U/L (ref 0–44)
AST: 23 U/L (ref 15–41)
Albumin: 3.3 g/dL — ABNORMAL LOW (ref 3.5–5.0)
Alkaline Phosphatase: 89 U/L (ref 38–126)
Anion gap: 4 — ABNORMAL LOW (ref 5–15)
BUN: 11 mg/dL (ref 8–23)
CO2: 27 mmol/L (ref 22–32)
Calcium: 9.9 mg/dL (ref 8.9–10.3)
Chloride: 106 mmol/L (ref 98–111)
Creatinine, Ser: 0.69 mg/dL (ref 0.44–1.00)
GFR, Estimated: >60 mL/min (ref >60)
Glucose, Bld: 104 mg/dL — ABNORMAL HIGH (ref 70–99)
Potassium: 4.7 mmol/L (ref 3.5–5.1)
Sodium: 137 mmol/L (ref 135–145)
Total Bilirubin: 0.6 mg/dL (ref 0.3–1.2)
Total Protein: 6.3 g/dL — ABNORMAL LOW (ref 6.5–8.1)

## 2021-08-20 LAB — BRAIN NATRIURETIC PEPTIDE: B Natriuretic Peptide: 199.2 pg/mL — ABNORMAL HIGH (ref 0.0–100.0)

## 2021-08-20 LAB — ECHOCARDIOGRAM COMPLETE
AR max vel: 1.99 cm2
AV Area VTI: 1.86 cm2
AV Area mean vel: 1.9 cm2
AV Mean grad: 10.3 mmHg
AV Peak grad: 19.3 mmHg
Ao pk vel: 2.2 m/s
Area-P 1/2: 3.6 cm2
Calc EF: 44.4 %
MV M vel: 4.75 m/s
MV Peak grad: 90.3 mmHg
P 1/2 time: 473 msec
Radius: 0.3 cm
Single Plane A2C EF: 47.4 %
Single Plane A4C EF: 42.5 %

## 2021-08-20 MED ORDER — ENTRESTO 49-51 MG PO TABS: 1.0000 | ORAL_TABLET | Freq: Two times a day (BID) | ORAL | 11 refills | Status: DC

## 2021-08-20 MED ORDER — CLINDAMYCIN HCL 300 MG PO CAPS: 600.0000 mg | ORAL_CAPSULE | ORAL | 6 refills | Status: DC | PRN

## 2021-08-20 NOTE — Progress Notes (Signed)
ADVANCED HEART FAILURE CLINIC NOTE  Patient ID: [name], female   DOB: [birth year], 70 y.o.   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 Had R breast cancer (unrelated) in 1987. Has been cancer free since. Denies HTN, HL, DM2.  Was first diagnosed with HF in 1988. Had left heart cath she thinks in 2009. 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 Echo 2009 EF 20% with moderate AI and mild to moderate MR. Had [place] ICD placed and then LV lead became nonfunctional. In 3/14, underwent CRT revision.   Had sleep study 1/17 very mild OSA (6.2/hr) not using CPAP.   Admitted in 6/18 withTIA. Unable to use right hand and speech slurred. CT normal. Unable to do MRI.   She returns today for regular follow up. Doing pretty well.   Echo 08/20/21: EF 40-45% Mild AS Mod AI Mod MR Personally reviewed   Echo 6/21 EF 40-45%   Echo 12/19 EF 25% RV ok    ICD interrogated personally: No VT/VF No AF.  Activity level 2.3hr/day Personally reviewed  ECHO 09/2013 with Dr. Wynonia Lawman and EF 10-15% with significant dyssynchrony. Subsequently had LV lead checked and was functioning well.  ECHO 7/15 EF 10-15%, moderate to severe RV dysfunction ECHO 4/16 EF 15% normal RV moderate AI ECHO 9/17 EF 20-25% RV ok. Severe MR mild AI/AS Echo 6/18 EF 20-25% RV ok Mild to moderate AI   CPX 2/19 FVC 3.41  (117%)      FEV1 2.45 (108%)        FEV1/FVC 72 (92%)        MVV 78 (89%)      Resting HR: 74 Peak HR: 121   (80% age predicted max HR) BP rest: 108/56 BP peak: 136/58 Peak VO2: 18.3 (103% predicted peak VO2) VE/VCO2 slope:  30 OUES: 2.45 Peak RER: 0.95  CPX 10/17  FVC 3.44 (113%)      FEV1 2.32 (97%)        FEV1/FVC 67 (86%)        MVV 90 (101%)      Resting  HR: 72 Peak HR: 112 (73% age predicted max HR)  BP rest: 104/64 BP peak: 146/64 Peak VO2: 16.0 (91% predicted peak VO2) VE/VCO2 slope:  34.6 OUES: 1.90 Peak RER: 1.10 Ventilatory Threshold: 13.5 (76.9% predicted or measured peak VO2) VE/MVV:  64% PETCO2 at peak:  29 O2pulse:  13   (130% predicted O2pulse)  LHC/RHC 10/06/13  RA = 11  RV = 54/9/13  PA = 53/27 (38)  PCW = 23  Fick cardiac output/index = 3.8/2.0  Thermo CO/CI = 3.4/1.8  PVR = 3.9 WU (Fick)  FA sat = 98%  PA sat = 62%, 64%  SVC = 59%  Coronaries no significant disease but there was a fistula from small D1 => PA.   CPX 10/25/13  Peak VO2: 15.6 (81.2% predicted peak VO2) VE/VCO2 slope: 36.9 OUES: 1.25 Peak RER: 1.08  CPX 05/24/14 FVC 3.05 (92%)  FEV1 2.23 (88%)  FEV1/FVC 73%  MVV 92 (100%) Resting HR: 64 Peak HR: 132 (85% age predicted max HR) BP rest: 108/70 BP peak: 130/66  Peak VO2: 16.4 (81.1% predicted peak VO2) VE/VCO2 slope: 33.6 OUES: 1.74 Peak RER: 1.16 Ventilatory Threshold: 12.5 (61.8% predicted peak VO2) VE/MVV: 62%  PETCO2 at peak: 31 O2pulse: 12 (109% predicted O2pulse)   Blood type A+   FHX: Had older brother with HF in his 34s. No strong FHx of HF.   SocHx: Retired. Non-smoker. Rare ETOH. 3 grown kids.   Review of systems complete and found to be negative unless listed in HPI.    Past Medical History:  Diagnosis Date   Breast CA ([place])    twice first on L breast 1982 w/ chest wall involvment, then a second breast ca on the R in 8338   Chronic systolic CHF (congestive heart failure) (HCC)    a. due to Adriamycin,s/p ICD;  b. 01/2013 Gen change and new LV lead - BSX Energen CRT-D BiV ICD, Ser # 250539   Depression    Diverticulitis    Early menopause    early 30s   Fatigue 02/20/2016   Glaucoma suspect of both eyes    Dr. Marshall Cork   Nonischemic cardiomyopathy [facility])    OSA (obstructive sleep apnea) 02/07/2016   Osteopenia    Renal calculus    Snoring 02/20/2016     Past Surgical History:  Procedure Laterality Date   BI-VENTRICULAR IMPLANTABLE CARDIOVERTER DEFIBRILLATOR UPGRADE N/A 02/04/2013   Procedure: BI-VENTRICULAR IMPLANTABLE CARDIOVERTER DEFIBRILLATOR UPGRADE;  Surgeon: Evans Lance, MD;  Location: [facility] CATH LAB;  Service: Cardiovascular;  Laterality: N/A;   CARDIAC DEFIBRILLATOR PLACEMENT     AICD Replaced-5/09 and 2014   LEFT AND RIGHT HEART CATHETERIZATION WITH CORONARY ANGIOGRAM N/A 10/06/2013   Procedure: LEFT AND RIGHT HEART CATHETERIZATION WITH CORONARY ANGIOGRAM;  Surgeon: Jolaine Artist, MD;  Location: [facility] CATH LAB;  Service: Cardiovascular;  Laterality: N/A;   LITHOTRIPSY     h/o several procedures    MASTECTOMY Bilateral    OOPHORECTOMY Bilateral    triger finger, R ring finger Right 02/2019   VENOGRAM N/A 10/20/2012   Procedure: VENOGRAM;  Surgeon: Deboraha Sprang, MD;  Location: [facility] CATH LAB;  Service: Cardiovascular;  Laterality: N/A;    Current Outpatient Medications  Medication Sig Dispense Refill   ascorbic acid (VITAMIN C) 1000 MG tablet Take 1,000 mg by mouth daily.     atorvastatin (LIPITOR) 40 MG tablet Take 1 tablet (40 mg total) by mouth daily. 90 tablet 1   Biotin 5000 MCG CAPS Take 5,000 mcg by mouth daily.      carvedilol (COREG) 12.5 MG tablet take 1 tablet by mouth in the morning and 1 tablet at night 180 tablet 3   clopidogrel (PLAVIX) 75 MG tablet TAKE ONE TABLET BY MOUTH ONE TIME DAILY 90 tablet 1   furosemide (LASIX) 40 MG tablet TAKE TWO TABLETS BY MOUTH IN THE MORNING and 1 tablet in the evening 270 tablet 0   gabapentin (NEURONTIN) 300 MG capsule Take 300 mg by mouth daily as needed (for shingles flares).      methocarbamol (ROBAXIN) 500 MG tablet Robaxin 500 mg tablet  Take 1 tablet every 6-8 hours by oral route for 10 days.     multivitamin (THERAGRAN) per tablet Take 1 tablet by mouth daily.     pantoprazole (PROTONIX) 40 MG tablet TAKE ONE TABLET BY MOUTH ONE TIME DAILY 90 tablet 0   potassium  chloride (KLOR-CON) 10 MEQ tablet TAKE 3 TABLETS BY MOUTH ONCE A DAY 270 tablet 3   Probiotic Product (PROBIOTIC DAILY PO) Take 1 capsule by mouth daily.      sacubitril-valsartan (ENTRESTO) 24-26 MG Take 1 tablet by mouth 2 (two) times daily. 60 tablet  11   sertraline (ZOLOFT) 25 MG tablet Take 1 tablet (25 mg total) by mouth daily. 90 tablet 1   spironolactone (ALDACTONE) 25 MG tablet TAKE ONE TABLET BY MOUTH ONE TIME DAILY 90 tablet 3   tiZANidine (ZANAFLEX) 2 MG tablet Take 1 tablet (2 mg total) by mouth every 8 (eight) hours as needed for muscle spasms. 21 tablet 0   vitamin B-12 (CYANOCOBALAMIN) 1000 MCG tablet Take 1,000 mcg by mouth daily.     zolpidem (AMBIEN) 10 MG tablet Take one-half to one tablet by mouth at bedtime as needed for sleep 90 tablet 1   No current facility-administered medications for this encounter.   Allergies  Allergen Reactions   Atacand [Candesartan] Other (See Comments)    Causes fatigue    Cephalexin Hives and Itching   Latex Rash   Physical Exam  Vitals:   08/20/21 0959  BP: (!) 152/57  Pulse: 67  SpO2: 97%  Weight: 82.1 kg (181 lb)   Wt Readings from Last 3 Encounters:  08/20/21 82.1 kg (181 lb)  08/03/21 80.3 kg (177 lb)  02/26/21 79.4 kg (175 lb)   General:  Well appearing. No resp difficulty HEENT: normal Neck: supple. no JVD. Carotids 2+ bilat; no bruits. No lymphadenopathy or thryomegaly appreciated. Cor: PMI nondisplaced. Regular rate & rhythm. @/6 AS/AI s2 ok Lungs: clear Abdomen: soft, nontender, nondistended. No hepatosplenomegaly. No bruits or masses. Good bowel sounds. Extremities: no cyanosis, clubbing, rash, edema Neuro: alert & orientedx3, cranial nerves grossly intact. moves all 4 extremities w/o difficulty. Affect pleasant   Assessment and  Plan  1) Chronic systolic HF:  - Nonischemic cardiomyopathy, possibly anthracycline cardiotoxicity.  LHC 11/14 without significant coronary disease.    Has [employer] CRT-D.  ECHO 09/2013 EF 10-15%. Echo at [facility] 4/16 EF 15% moderate AI.  - Echo 9/17 EF 20-25%.  RV ok. Severe MR mild AI/AS  Seen in [facility] and will follow prn - Echo 04/2017 LVEF 20-25%, Grade 1 DD. - Echo 12/19 EF 20-25% RV ok - Echo 6/21 EF improved to 40-45% - Echo today EF 40-45% Mild AS Moderate AI and moderate functional MR - Stable NYHA II - Volume status looks goo done xam - ICD interrogated personally. No VT/AF Volume ok.  - Continue lasix 80 mg q am and 40 mg q pm.  - Previously intolerant of med titration due to low BP and refused Farxiga due to cost - BP now up. We discussed adding [place] or increasing Entresto.She wants to increase Entresto - Increase Entresto 49/51 mg BID - Continue carvedilol 6.25/12.5 - Continue spironolactone 25 mg daily. - Consider Farxiga at next visit - She has aged out of transplant consideration and says she would not want an LVAD if she got worse.  - Labs today.  2) Valvular heart disease - echo today mild AS mod AI and moderate functional MR - Follow closely. Repeat echo in 6 months - SBE prophylaxis - Consider TEE in near future if no improvement 3) h/o bilateral breast CA in 1982 and 1987 (treated with Adriamycin in 1982) - stable. No change 4) ICD  - follows with Dr. Caryl Comes. Interrogation today as above 5) h/o TIA.  - Continue Plavix and statin. No recent Neuro symptoms. No change   Glori Bickers, MD 08/20/2021

## 2021-08-20 NOTE — Progress Notes (Signed)
  Echocardiogram 2D Echocardiogram has been performed.  [name] 08/20/2021, 9:50 AM

## 2021-08-20 NOTE — Addendum Note (Signed)
Encounter addended by: Scarlette Calico, RN on: 08/20/2021 11:34 AM  Actions taken: Charge Capture section accepted

## 2021-08-20 NOTE — Patient Instructions (Signed)
Increase Entresto to 49/51 mg Twice daily   Take Clindamycin 600 mg (2 capsules) 30 min to 1 hour prior to dental work  Labs done today, your results will be available in MyChart, we will contact you for abnormal readings.  Your physician recommends that you schedule a follow-up appointment in: 6 months (March 2023) **PLEASE CALL OUR OFFICE IN January TO SCHEDULE THIS APPOINTMENT  If you have any questions or concerns before your next appointment please send [place] a message through [place] or call our office at [phone].    TO LEAVE A MESSAGE FOR THE NURSE SELECT OPTION 2, PLEASE LEAVE A MESSAGE INCLUDING: YOUR NAME DATE OF BIRTH CALL BACK NUMBER REASON FOR CALL**this is important as we prioritize the call backs  YOU WILL RECEIVE A CALL BACK THE SAME DAY AS LONG AS YOU CALL BEFORE 4:00 PM  At the [facility], you and your health needs are our priority. As part of our continuing mission to provide you with exceptional heart care, we have created designated Provider Care Teams. These Care Teams include your primary Cardiologist (physician) and Advanced Practice Providers (APPs- Physician Assistants and Nurse Practitioners) who all work together to provide you with the care you need, when you need it.   You may see any of the following providers on your designated Care Team at your next follow up: Dr Glori Bickers Dr Loralie Champagne Dr Patrice Paradise, NP Lyda Jester, [place] Ginnie Smart Audry Riles, PharmD   Please be sure to bring in all your medications bottles to every appointment.

## 2021-09-03 ENCOUNTER — Ambulatory Visit: Payer: Medicare Other | Admitting: Internal Medicine

## 2021-09-06 ENCOUNTER — Ambulatory Visit (INDEPENDENT_AMBULATORY_CARE_PROVIDER_SITE_OTHER): Payer: Medicare Other

## 2021-09-06 DIAGNOSIS — I5022 Chronic systolic (congestive) heart failure: Secondary | ICD-10-CM | POA: Diagnosis not present

## 2021-09-09 ENCOUNTER — Other Ambulatory Visit (HOSPITAL_COMMUNITY): Payer: Self-pay | Admitting: Internal Medicine

## 2021-09-10 LAB — CUP PACEART REMOTE DEVICE CHECK
Battery Remaining Longevity: 18 mo
Battery Remaining Percentage: 30 %
Brady Statistic RA Percent Paced: 0 %
Brady Statistic RV Percent Paced: 100 %
Date Time Interrogation Session: 20221014153500
HighPow Impedance: 58 Ohm
Implantable Lead Implant Date: 20050701
Implantable Lead Implant Date: 20050701
Implantable Lead Implant Date: 20140314
Implantable Lead Location: 753858
Implantable Lead Location: 753859
Implantable Lead Location: 753860
Implantable Lead Model: 158
Implantable Lead Model: 4196
Implantable Lead Model: 5076
Implantable Lead Serial Number: 156416
Implantable Pulse Generator Implant Date: 20140314
Lead Channel Impedance Value: 476 Ohm
Lead Channel Impedance Value: 536 Ohm
Lead Channel Impedance Value: 582 Ohm
Lead Channel Pacing Threshold Amplitude: 0.6 V
Lead Channel Pacing Threshold Amplitude: 0.7 V
Lead Channel Pacing Threshold Amplitude: 0.9 V
Lead Channel Pacing Threshold Pulse Width: 0.4 ms
Lead Channel Pacing Threshold Pulse Width: 0.4 ms
Lead Channel Pacing Threshold Pulse Width: 0.6 ms
Lead Channel Setting Pacing Amplitude: 2 V
Lead Channel Setting Pacing Amplitude: 2 V
Lead Channel Setting Pacing Amplitude: 2 V
Lead Channel Setting Pacing Pulse Width: 0.4 ms
Lead Channel Setting Pacing Pulse Width: 0.6 ms
Lead Channel Setting Sensing Sensitivity: 0.5 mV
Lead Channel Setting Sensing Sensitivity: 1 mV
Pulse Gen Serial Number: 111235

## 2021-09-13 NOTE — Progress Notes (Signed)
Remote ICD transmission.   

## 2021-09-19 ENCOUNTER — Telehealth (HOSPITAL_COMMUNITY): Payer: Self-pay | Admitting: *Deleted

## 2021-09-19 ENCOUNTER — Other Ambulatory Visit (HOSPITAL_COMMUNITY): Payer: Self-pay | Admitting: *Deleted

## 2021-09-19 MED ORDER — AMOXICILLIN 500 MG PO CAPS: ORAL_CAPSULE | ORAL | 0 refills | Status: DC

## 2021-09-19 NOTE — Telephone Encounter (Signed)
Pt left vm stating she is supposed to take antibiotics before dental procedure. Antibiotic not on med list.    Routed to [place] for advice

## 2021-09-19 NOTE — Telephone Encounter (Signed)
Med sent to pharmacy.

## 2021-09-20 ENCOUNTER — Encounter: Payer: Self-pay | Admitting: Internal Medicine

## 2021-09-20 ENCOUNTER — Other Ambulatory Visit: Payer: Self-pay

## 2021-09-20 ENCOUNTER — Ambulatory Visit (INDEPENDENT_AMBULATORY_CARE_PROVIDER_SITE_OTHER): Payer: Medicare Other | Admitting: Internal Medicine

## 2021-09-20 VITALS — BP 124/82 | HR 83 | Temp 98.3°F | Resp 16 | Ht 64.0 in | Wt 180.5 lb

## 2021-09-20 DIAGNOSIS — F32A Depression, unspecified: Secondary | ICD-10-CM | POA: Diagnosis not present

## 2021-09-20 DIAGNOSIS — E559 Vitamin D deficiency, unspecified: Secondary | ICD-10-CM | POA: Diagnosis not present

## 2021-09-20 DIAGNOSIS — G4700 Insomnia, unspecified: Secondary | ICD-10-CM | POA: Diagnosis not present

## 2021-09-20 DIAGNOSIS — I428 Other cardiomyopathies: Secondary | ICD-10-CM | POA: Diagnosis not present

## 2021-09-20 DIAGNOSIS — E785 Hyperlipidemia, unspecified: Secondary | ICD-10-CM

## 2021-09-20 DIAGNOSIS — R739 Hyperglycemia, unspecified: Secondary | ICD-10-CM

## 2021-09-20 NOTE — Progress Notes (Signed)
Subjective:    Patient ID: [name], female    DOB: 05-Mar-1951, 70 y.o.   MRN: [id number]  DOS:  09/20/2021 Type of visit - description: Follow-up  Since the last visit is doing well. Saw cardiology, note reviewed. Had neck pain, went to the ER 04/2021, now improved/resolved. With talk about vaccinations today as well.  Review of Systems  Denies chest pain or difficulty breathing No edema.   Past Medical History:  Diagnosis Date   Breast CA ([place])    twice first on L breast 1982 w/ chest wall involvment, then a second breast ca on the R in 5035   Chronic systolic CHF (congestive heart failure) (HCC)    a. due to Adriamycin,s/p ICD;  b. 01/2013 Gen change and new LV lead - BSX Energen CRT-D BiV ICD, Ser # [id number]   Depression    Diverticulitis    Early menopause    early 30s   Fatigue 02/20/2016   Glaucoma suspect of both eyes    Dr. Marshall Cork   Nonischemic cardiomyopathy [facility])    OSA (obstructive sleep apnea) 02/07/2016   Osteopenia    Renal calculus    Snoring 02/20/2016    Past Surgical History:  Procedure Laterality Date   BI-VENTRICULAR IMPLANTABLE CARDIOVERTER DEFIBRILLATOR UPGRADE N/A 02/04/2013   Procedure: BI-VENTRICULAR IMPLANTABLE CARDIOVERTER DEFIBRILLATOR UPGRADE;  Surgeon: Evans Lance, MD;  Location: [facility] CATH LAB;  Service: Cardiovascular;  Laterality: N/A;   CARDIAC DEFIBRILLATOR PLACEMENT     AICD Replaced-5/09 and 2014   LEFT AND RIGHT HEART CATHETERIZATION WITH CORONARY ANGIOGRAM N/A 10/06/2013   Procedure: LEFT AND RIGHT HEART CATHETERIZATION WITH CORONARY ANGIOGRAM;  Surgeon: Jolaine Artist, MD;  Location[facility] CATH LAB;  Service: Cardiovascular;  Laterality: N/A;   LITHOTRIPSY     h/o several procedures    MASTECTOMY Bilateral    OOPHORECTOMY Bilateral    triger finger, R ring finger Right 02/2019   VENOGRAM N/A 10/20/2012   Procedure: VENOGRAM;  Surgeon: Deboraha Sprang, MD;  Location: [facility] CATH LAB;  Service: Cardiovascular;   Laterality: N/A;    Allergies as of 09/20/2021       Reactions   Atacand [candesartan] Other (See Comments)   Causes fatigue   Cephalexin Hives, Itching   Latex Rash        Medication List        Accurate as of September 20, 2021 11:59 PM. If you have any questions, ask your nurse or doctor.          STOP taking these medications    methocarbamol 500 MG tablet Commonly known as: ROBAXIN Stopped by: Kathlene November, MD       TAKE these medications    amoxicillin 500 MG capsule Commonly known as: AMOXIL Take 4 capsules 30 minutes before dental proedure.   ascorbic acid 1000 MG tablet Commonly known as: VITAMIN C Take 1,000 mg by mouth daily.   atorvastatin 40 MG tablet Commonly known as: LIPITOR Take 1 tablet (40 mg total) by mouth daily.   Biotin 5000 MCG Caps Take 5,000 mcg by mouth daily.   carvedilol 12.5 MG tablet Commonly known as: COREG take 1 tablet by mouth in the morning and 1 tablet at night   clindamycin 300 MG capsule Commonly known as: CLEOCIN Take 2 capsules (600 mg total) by mouth as needed (1 hour before dental work).   clopidogrel 75 MG tablet Commonly known as: PLAVIX TAKE ONE TABLET BY MOUTH ONE TIME DAILY  Entresto 49-51 MG Generic drug: sacubitril-valsartan Take 1 tablet by mouth 2 (two) times daily. What changed: how much to take   furosemide 40 MG tablet Commonly known as: LASIX TAKE TWO TABLETS BY MOUTH IN THE MORNING and 1 tablet in the evening   gabapentin 300 MG capsule Commonly known as: NEURONTIN Take 300 mg by mouth daily as needed (for shingles flares).   multivitamin per tablet Take 1 tablet by mouth daily.   pantoprazole 40 MG tablet Commonly known as: PROTONIX TAKE ONE TABLET BY MOUTH ONE TIME DAILY   potassium chloride 10 MEQ tablet Commonly known as: KLOR-CON TAKE 3 TABLETS BY MOUTH ONCE A DAY   PROBIOTIC DAILY PO Take 1 capsule by mouth daily.   sertraline 25 MG tablet Commonly known as: ZOLOFT Take  1 tablet (25 mg total) by mouth daily.   spironolactone 25 MG tablet Commonly known as: ALDACTONE TAKE ONE TABLET BY MOUTH ONE TIME DAILY   tiZANidine 2 MG tablet Commonly known as: ZANAFLEX Take 1 tablet (2 mg total) by mouth every 8 (eight) hours as needed for muscle spasms.   vitamin B-12 1000 MCG tablet Commonly known as: CYANOCOBALAMIN Take 1,000 mcg by mouth daily.   zolpidem 10 MG tablet Commonly known as: AMBIEN Take one-half to one tablet by mouth at bedtime as needed for sleep           Objective:   Physical Exam BP 124/82 (BP Location: Left Arm, Patient Position: Sitting, Cuff Size: Small)   Pulse 83   Temp 98.3 F (36.8 C) (Oral)   Resp 16   Ht 5\' 4"  (1.626 m)   Wt 180 lb 8 oz (81.9 kg)   SpO2 97%   BMI 30.98 kg/m  General:   Well developed, NAD, BMI noted. HEENT:  Normocephalic . Face symmetric, atraumatic Lungs:  CTA B Normal respiratory effort, no intercostal retractions, no accessory muscle use. Heart: RRR, + systolic murmur Lower extremities: no pretibial edema bilaterally  Skin: Not pale. Not jaundice Neurologic:  alert & oriented X3.  Speech normal, gait appropriate for age and unassisted Psych--  Cognition and judgment appear intact.  Cooperative with normal attention span and concentration.  Behavior appropriate. No anxious or depressed appearing.      Assessment     Assessment Hyperglycemia A1c 5.9 (04/2017) CV:Dr. Caryl Comes and Bensimohn --CHF, nonischemic cardiomyopathy:due to chemotherapy --MR severe --VT -TIA 05-2017 Depression, insomnia Mild OSA per sleep study 11-2015, saw Dr Radford Pax 02-20-16: no need for CPAP Osteopenia-- DEXA 06/13/2015 normal Vitamin D deficiency Kidney stones Menopause Breast cancer x 2: first in 1982 with chest wall involvement, 2nd on the R dx  1987, s/p B mastectomy; + BRACA Handicap sticker signed [phone] Post herpetic neuralgia (shingles 2014) , R face-- gaba prn  PLAN: Hyperglycemia: Check  A1c. High cholesterol: On atorvastatin, check FLP, last LFTs normal.  Preventive care: CHF, nonischemic cardiomyopathy: Recently saw cardiology, Entresto dose increased, seems to be doing great.  Check BMP Depression, insomnia: Well-controlled sertraline and Ambien, contract signed. Vitamin D deficiency: On OTCs, checking labs Prevent care: Recommend flu shot and new COVID booster.  Plans to proceed to her pharmacy RTC next week fasting for blood RTC for CPX 4 months    This visit occurred during the SARS-CoV-2 public health emergency.  Safety protocols were in place, including screening questions prior to the visit, additional usage of staff PPE, and extensive cleaning of exam room while observing appropriate contact time as indicated for disinfecting solutions.

## 2021-09-20 NOTE — Patient Instructions (Addendum)
Recommend to proceed with the following vaccines at your pharmacy:  Shingrix (shingles) New covid booster (Bivalent) Flu shot  You are due for your repeat colonoscopy with [facility]. Please call their office at [phone] to schedule.   Continue checking your blood pressure BP GOAL is between 110/65 and  135/85. If it is consistently higher or lower, let me know        K[place], Steele back for fasting blood work next week  Come back for a physical exam in 4   months

## 2021-09-22 NOTE — Assessment & Plan Note (Signed)
Assessment Hyperglycemia A1c 5.9 (04/2017) CV:Dr. Caryl Comes and Bensimohn --CHF, nonischemic cardiomyopathy:due to chemotherapy --MR severe --VT -TIA 05-2017 Depression, insomnia Mild OSA per sleep study 11-2015, saw Dr Radford Pax 02-20-16: no need for CPAP Osteopenia-- DEXA 06/13/2015 normal Vitamin D deficiency Kidney stones Menopause Breast cancer x 2: first in 1982 with chest wall involvement, 2nd on the R dx  1987, s/p B mastectomy; + BRACA Handicap sticker signed [phone] Post herpetic neuralgia (shingles 2014) , R face-- gaba prn  PLAN: Hyperglycemia: Check A1c. High cholesterol: On atorvastatin, check FLP, last LFTs normal.  Preventive care: CHF, nonischemic cardiomyopathy: Recently saw cardiology, Entresto dose increased, seems to be doing great.  Check BMP Depression, insomnia: Well-controlled sertraline and Ambien, contract signed. Vitamin D deficiency: On OTCs, checking labs Prevent care: Recommend flu shot and new COVID booster.  Plans to proceed to her pharmacy RTC next week fasting for blood RTC for CPX 4 months

## 2021-09-23 ENCOUNTER — Other Ambulatory Visit: Payer: Self-pay

## 2021-09-23 ENCOUNTER — Other Ambulatory Visit (INDEPENDENT_AMBULATORY_CARE_PROVIDER_SITE_OTHER): Payer: Medicare Other

## 2021-09-23 DIAGNOSIS — E785 Hyperlipidemia, unspecified: Secondary | ICD-10-CM

## 2021-09-23 DIAGNOSIS — I428 Other cardiomyopathies: Secondary | ICD-10-CM | POA: Diagnosis not present

## 2021-09-23 DIAGNOSIS — E559 Vitamin D deficiency, unspecified: Secondary | ICD-10-CM | POA: Diagnosis not present

## 2021-09-23 DIAGNOSIS — M542 Cervicalgia: Secondary | ICD-10-CM | POA: Diagnosis not present

## 2021-09-23 DIAGNOSIS — M1811 Unilateral primary osteoarthritis of first carpometacarpal joint, right hand: Secondary | ICD-10-CM | POA: Diagnosis not present

## 2021-09-23 DIAGNOSIS — R739 Hyperglycemia, unspecified: Secondary | ICD-10-CM | POA: Diagnosis not present

## 2021-09-23 LAB — BASIC METABOLIC PANEL
BUN: 10 mg/dL (ref 6–23)
CO2: 30 mEq/L (ref 19–32)
Calcium: 9.7 mg/dL (ref 8.4–10.5)
Chloride: 102 mEq/L (ref 96–112)
Creatinine, Ser: 0.83 mg/dL (ref 0.40–1.20)
GFR: 70.5 mL/min (ref >60.00)
Glucose, Bld: 150 mg/dL — ABNORMAL HIGH (ref 70–99)
Potassium: 4.4 mEq/L (ref 3.5–5.1)
Sodium: 139 mEq/L (ref 135–145)

## 2021-09-23 LAB — HEMOGLOBIN A1C: Hgb A1c MFr Bld: 6.3 % (ref 4.6–6.5)

## 2021-09-23 LAB — LIPID PANEL
Cholesterol: 123 mg/dL (ref 0–200)
HDL: 55.9 mg/dL (ref >39.00)
LDL Cholesterol: 45 mg/dL (ref 0–99)
NonHDL: 66.68
Total CHOL/HDL Ratio: 2
Triglycerides: 108 mg/dL (ref 0.0–149.0)
VLDL: 21.6 mg/dL (ref 0.0–40.0)

## 2021-09-23 LAB — VITAMIN D 25 HYDROXY (VIT D DEFICIENCY, FRACTURES): VITD: 34.1 ng/mL (ref 30.00–100.00)

## 2021-10-01 DIAGNOSIS — D225 Melanocytic nevi of trunk: Secondary | ICD-10-CM | POA: Diagnosis not present

## 2021-10-01 DIAGNOSIS — Z08 Encounter for follow-up examination after completed treatment for malignant neoplasm: Secondary | ICD-10-CM | POA: Diagnosis not present

## 2021-10-01 DIAGNOSIS — Z85828 Personal history of other malignant neoplasm of skin: Secondary | ICD-10-CM | POA: Diagnosis not present

## 2021-10-01 DIAGNOSIS — L570 Actinic keratosis: Secondary | ICD-10-CM | POA: Diagnosis not present

## 2021-10-01 DIAGNOSIS — L821 Other seborrheic keratosis: Secondary | ICD-10-CM | POA: Diagnosis not present

## 2021-10-01 DIAGNOSIS — L814 Other melanin hyperpigmentation: Secondary | ICD-10-CM | POA: Diagnosis not present

## 2021-10-03 ENCOUNTER — Other Ambulatory Visit (HOSPITAL_COMMUNITY): Payer: Self-pay | Admitting: *Deleted

## 2021-10-03 ENCOUNTER — Telehealth (HOSPITAL_COMMUNITY): Payer: Self-pay | Admitting: Pharmacy Technician

## 2021-10-03 MED ORDER — ENTRESTO 49-51 MG PO TABS: 1.0000 | ORAL_TABLET | Freq: Two times a day (BID) | ORAL | 3 refills | Status: DC

## 2021-10-03 NOTE — Telephone Encounter (Signed)
Advanced Heart Failure Patient Advocate Encounter  Patient called in requesting assistance with re-applying for Entresto assistance for next year. Called and left her a message detailing what next steps are.  Charlann Boxer, CPhT

## 2021-10-07 ENCOUNTER — Other Ambulatory Visit (HOSPITAL_COMMUNITY): Payer: Self-pay | Admitting: Internal Medicine

## 2021-11-01 ENCOUNTER — Other Ambulatory Visit (HOSPITAL_COMMUNITY): Payer: Self-pay

## 2021-11-01 MED ORDER — ENTRESTO 49-51 MG PO TABS: 1.0000 | ORAL_TABLET | Freq: Two times a day (BID) | ORAL | 3 refills | Status: DC

## 2021-11-08 DIAGNOSIS — H25813 Combined forms of age-related cataract, bilateral: Secondary | ICD-10-CM | POA: Diagnosis not present

## 2021-11-08 DIAGNOSIS — H40013 Open angle with borderline findings, low risk, bilateral: Secondary | ICD-10-CM | POA: Diagnosis not present

## 2021-11-08 DIAGNOSIS — H353131 Nonexudative age-related macular degeneration, bilateral, early dry stage: Secondary | ICD-10-CM | POA: Diagnosis not present

## 2021-11-08 DIAGNOSIS — H04123 Dry eye syndrome of bilateral lacrimal glands: Secondary | ICD-10-CM | POA: Diagnosis not present

## 2021-11-11 ENCOUNTER — Other Ambulatory Visit: Payer: Self-pay | Admitting: Internal Medicine

## 2021-11-11 ENCOUNTER — Other Ambulatory Visit (HOSPITAL_COMMUNITY): Payer: Self-pay | Admitting: Internal Medicine

## 2021-11-12 NOTE — Telephone Encounter (Signed)
Sent in [employer] application via fax.  Will follow up.  

## 2021-11-20 DIAGNOSIS — H40009 Preglaucoma, unspecified, unspecified eye: Secondary | ICD-10-CM | POA: Insufficient documentation

## 2021-12-06 ENCOUNTER — Telehealth (HOSPITAL_COMMUNITY): Payer: Self-pay | Admitting: Pharmacist

## 2021-12-06 ENCOUNTER — Ambulatory Visit (INDEPENDENT_AMBULATORY_CARE_PROVIDER_SITE_OTHER): Payer: Medicare Other

## 2021-12-06 DIAGNOSIS — I5022 Chronic systolic (congestive) heart failure: Secondary | ICD-10-CM

## 2021-12-06 LAB — CUP PACEART REMOTE DEVICE CHECK
Battery Remaining Longevity: 12 mo
Battery Remaining Percentage: 25 %
Brady Statistic RA Percent Paced: 0 %
Brady Statistic RV Percent Paced: 100 %
Date Time Interrogation Session: 20230113005000
HighPow Impedance: 59 Ohm
Implantable Lead Implant Date: 20050701
Implantable Lead Implant Date: 20050701
Implantable Lead Implant Date: 20140314
Implantable Lead Location: 753858
Implantable Lead Location: 753859
Implantable Lead Location: 753860
Implantable Lead Model: 158
Implantable Lead Model: 4196
Implantable Lead Model: 5076
Implantable Lead Serial Number: 156416
Implantable Pulse Generator Implant Date: 20140314
Lead Channel Impedance Value: 393 Ohm
Lead Channel Impedance Value: 505 Ohm
Lead Channel Impedance Value: 602 Ohm
Lead Channel Pacing Threshold Amplitude: 0.6 V
Lead Channel Pacing Threshold Amplitude: 0.7 V
Lead Channel Pacing Threshold Amplitude: 0.9 V
Lead Channel Pacing Threshold Pulse Width: 0.4 ms
Lead Channel Pacing Threshold Pulse Width: 0.4 ms
Lead Channel Pacing Threshold Pulse Width: 0.6 ms
Lead Channel Setting Pacing Amplitude: 2 V
Lead Channel Setting Pacing Amplitude: 2 V
Lead Channel Setting Pacing Amplitude: 2 V
Lead Channel Setting Pacing Pulse Width: 0.4 ms
Lead Channel Setting Pacing Pulse Width: 0.6 ms
Lead Channel Setting Sensing Sensitivity: 0.5 mV
Lead Channel Setting Sensing Sensitivity: 1 mV
Pulse Gen Serial Number: 111235

## 2021-12-06 NOTE — Telephone Encounter (Signed)
Advanced Heart Failure Patient Advocate Encounter   Patient was approved to receive Entresto from [employer].   Patient ID: [id number] Effective dates: 11/24/21 through 11/23/22  Audry Riles, PharmD, BCPS, BCCP, CPP Heart Failure Clinic Pharmacist [phone]

## 2021-12-17 NOTE — Progress Notes (Signed)
Remote ICD transmission.   

## 2021-12-18 ENCOUNTER — Other Ambulatory Visit (HOSPITAL_COMMUNITY): Payer: Self-pay | Admitting: Internal Medicine

## 2021-12-27 ENCOUNTER — Other Ambulatory Visit (HOSPITAL_COMMUNITY): Payer: Self-pay | Admitting: Internal Medicine

## 2022-01-14 ENCOUNTER — Encounter: Payer: Self-pay | Admitting: Gastroenterology

## 2022-01-23 ENCOUNTER — Ambulatory Visit: Payer: Medicare Other | Admitting: Internal Medicine

## 2022-01-27 ENCOUNTER — Ambulatory Visit (INDEPENDENT_AMBULATORY_CARE_PROVIDER_SITE_OTHER): Payer: Medicare Other | Admitting: Internal Medicine

## 2022-01-27 ENCOUNTER — Encounter: Payer: Self-pay | Admitting: Internal Medicine

## 2022-01-27 VITALS — BP 128/78 | HR 65 | Temp 98.2°F | Resp 97 | Ht 64.0 in | Wt 185.2 lb

## 2022-01-27 DIAGNOSIS — Z0000 Encounter for general adult medical examination without abnormal findings: Secondary | ICD-10-CM

## 2022-01-27 DIAGNOSIS — I5022 Chronic systolic (congestive) heart failure: Secondary | ICD-10-CM

## 2022-01-27 DIAGNOSIS — Z23 Encounter for immunization: Secondary | ICD-10-CM

## 2022-01-27 DIAGNOSIS — R739 Hyperglycemia, unspecified: Secondary | ICD-10-CM | POA: Diagnosis not present

## 2022-01-27 DIAGNOSIS — Z1211 Encounter for screening for malignant neoplasm of colon: Secondary | ICD-10-CM

## 2022-01-27 LAB — TSH: TSH: 1.37 u[IU]/mL (ref 0.35–5.50)

## 2022-01-27 LAB — BASIC METABOLIC PANEL
BUN: 11 mg/dL (ref 6–23)
CO2: 29 mEq/L (ref 19–32)
Calcium: 9.9 mg/dL (ref 8.4–10.5)
Chloride: 105 mEq/L (ref 96–112)
Creatinine, Ser: 0.8 mg/dL (ref 0.40–1.20)
GFR: 73.5 mL/min (ref >60.00)
Glucose, Bld: 115 mg/dL — ABNORMAL HIGH (ref 70–99)
Potassium: 4.6 mEq/L (ref 3.5–5.1)
Sodium: 140 mEq/L (ref 135–145)

## 2022-01-27 LAB — CBC WITH DIFFERENTIAL/PLATELET
Basophils Absolute: 0 10*3/uL (ref 0.0–0.1)
Basophils Relative: 0.7 % (ref 0.0–3.0)
Eosinophils Absolute: 0.3 10*3/uL (ref 0.0–0.7)
Eosinophils Relative: 4.5 % (ref 0.0–5.0)
HCT: 37.7 % (ref 36.0–46.0)
Hemoglobin: 12.7 g/dL (ref 12.0–15.0)
Lymphocytes Relative: 30.4 % (ref 12.0–46.0)
Lymphs Abs: 1.7 10*3/uL (ref 0.7–4.0)
MCHC: 33.7 g/dL (ref 30.0–36.0)
MCV: 92.9 fl (ref 78.0–100.0)
Monocytes Absolute: 0.6 10*3/uL (ref 0.1–1.0)
Monocytes Relative: 10.4 % (ref 3.0–12.0)
Neutro Abs: 3 10*3/uL (ref 1.4–7.7)
Neutrophils Relative %: 54 % (ref 43.0–77.0)
Platelets: 248 10*3/uL (ref 150.0–400.0)
RBC: 4.06 Mil/uL (ref 3.87–5.11)
RDW: 13.3 % (ref 11.5–15.5)
WBC: 5.6 10*3/uL (ref 4.0–10.5)

## 2022-01-27 LAB — HEMOGLOBIN A1C: Hgb A1c MFr Bld: 6.2 % (ref 4.6–6.5)

## 2022-01-27 NOTE — Progress Notes (Signed)
? ?Subjective:  ? ? Patient ID: [name], female    DOB: [birth year], 71 y.o.   MRN: [id number] ? ?DOS:  01/27/2022 ?Type of visit - description: CPX ? ?Since the last office visit is doing well. ?Has no concerns. ?Very rarely her BP is in the low side and she feels weak.  No LOC.  Otherwise denies chest pain or difficulty breathing.  No edema. ? ?Review of Systems ? ?Other than above, a 14 point review of systems is negative  ? ? ? ?Past Medical History:  ?Diagnosis Date  ? Breast CA ([place])   ? twice first on L breast 1982 w/ chest wall involvment, then a second breast ca on the R in 1987  ? Chronic systolic CHF (congestive heart failure) ([place])   ? a. due to Adriamycin,s/p ICD;  b. 01/2013 Gen change and new LV lead - BSX Energen CRT-D BiV ICD, Ser # [id number]  ? Depression   ? Diverticulitis   ? Early menopause   ? early 19s  ? Fatigue 02/20/2016  ? Glaucoma suspect of both eyes   ? Dr. Marshall Cork  ? Nonischemic cardiomyopathy ([place])   ? OSA (obstructive sleep apnea) 02/07/2016  ? Osteopenia   ? Renal calculus   ? Snoring 02/20/2016  ? ? ?Past Surgical History:  ?Procedure Laterality Date  ? BI-VENTRICULAR IMPLANTABLE CARDIOVERTER DEFIBRILLATOR UPGRADE N/A 02/04/2013  ? Procedure: BI-VENTRICULAR IMPLANTABLE CARDIOVERTER DEFIBRILLATOR UPGRADE;  Surgeon: Evans Lance, MD;  Location: [facility] CATH LAB;  Service: Cardiovascular;  Laterality: N/A;  ? CARDIAC DEFIBRILLATOR PLACEMENT    ? AICD Replaced-5/09 and 2014  ? LEFT AND RIGHT HEART CATHETERIZATION WITH CORONARY ANGIOGRAM N/A 10/06/2013  ? Procedure: LEFT AND RIGHT HEART CATHETERIZATION WITH CORONARY ANGIOGRAM;  Surgeon: Jolaine Artist, MD;  Location: [facility] CATH LAB;  Service: Cardiovascular;  Laterality: N/A;  ? LITHOTRIPSY    ? h/o several procedures   ? MASTECTOMY Bilateral   ? OOPHORECTOMY Bilateral   ? triger finger, R ring finger Right 02/2019  ? VENOGRAM N/A 10/20/2012  ? Procedure: VENOGRAM;  Surgeon: Deboraha Sprang, MD;  Location: [facility] CATH LAB;  Service:  Cardiovascular;  Laterality: N/A;  ? ?Social History  ? ?Socioeconomic History  ? Marital status: Married  ?  Spouse name: Enriqueta Shutter  ? Number of children: 3  ? Years of education: Not on file  ? Highest education level: Not on file  ?Occupational History  ? Occupation: RETIRED---pre Librarian, academic   ?  Employer: [employer]  ?  Comment: preschool  ?Tobacco Use  ? Smoking status: Never  ? Smokeless tobacco: Never  ?Vaping Use  ? Vaping Use: Never used  ?Substance and Sexual Activity  ? Alcohol use: Yes  ?  Comment: socially/occassionally  ? Drug use: No  ? Sexual activity: Not on file  ?Other Topics Concern  ? Not on file  ?Social History Narrative  ? Related to Mr and Mrs Huston Foley, they are my patients as well   ? Married, 3 children all in [place], 7 Gkids    ? Siblings, 2 deceased, 2 living brothers   ? ?Social Determinants of Health  ? ?Financial Resource Strain: Low Risk   ? Difficulty of Paying Living Expenses: Not hard at all  ?Food Insecurity: No Food Insecurity  ? Worried About Charity fundraiser in the Last Year: Never true  ? Ran Out of Food in the Last Year: Never true  ?Transportation Needs: No Transportation Needs  ? Lack  of Transportation (Medical): No  ? Lack of Transportation (Non-Medical): No  ?Physical Activity: Sufficiently Active  ? Days of Exercise per Week: 5 days  ? Minutes of Exercise per Session: 30 min  ?Stress: No Stress Concern Present  ? Feeling of Stress : Not at all  ?Social Connections: Socially Integrated  ? Frequency of Communication with Friends and Family: More than three times a week  ? Frequency of Social Gatherings with Friends and Family: More than three times a week  ? Attends Religious Services: More than 4 times per year  ? Active Member of Clubs or Organizations: Yes  ? Attends Archivist Meetings: Not on file  ? Marital Status: Married  ?Intimate Partner Violence: Not At Risk  ? Fear of Current or Ex-Partner: No  ? Emotionally Abused: No  ? Physically Abused: No  ?  Sexually Abused: No  ? ? ?Current Outpatient Medications  ?Medication Instructions  ? amoxicillin (AMOXIL) 500 MG capsule Take 4 capsules 30 minutes before dental proedure.  ? ascorbic acid (VITAMIN C) 1,000 mg, Daily  ? atorvastatin (LIPITOR) 40 MG tablet TAKE ONE TABLET BY MOUTH ONE TIME DAILY  ? Biotin 5,000 mcg, Oral, Daily  ? carvedilol (COREG) 12.5 MG tablet take 1 tablet by mouth in the morning and 1 tablet at night  ? clindamycin (CLEOCIN) 600 mg, Oral, As needed  ? clopidogrel (PLAVIX) 75 MG tablet TAKE ONE TABLET BY MOUTH ONE TIME DAILY  ? furosemide (LASIX) 40 MG tablet Take 2 tablets (80 mg total) by mouth every morning AND 1 tablet (40 mg total) every evening.  ? gabapentin (NEURONTIN) 300 mg, Oral, Daily PRN  ? multivitamin (THERAGRAN) per tablet 1 tablet, Oral, Daily  ? pantoprazole (PROTONIX) 40 MG tablet TAKE ONE TABLET BY MOUTH ONE TIME DAILY  ? potassium chloride (KLOR-CON) 10 MEQ tablet TAKE 3 TABLETS BY MOUTH ONCE A DAY  ? Probiotic Product (PROBIOTIC DAILY PO) 1 capsule, Oral, Daily  ? sacubitril-valsartan (ENTRESTO) 49-51 MG 1 tablet, Oral, 2 times daily  ? sertraline (ZOLOFT) 25 MG tablet TAKE ONE TABLET BY MOUTH ONE TIME DAILY  ? spironolactone (ALDACTONE) 25 MG tablet TAKE ONE TABLET BY MOUTH ONE TIME DAILY  ? tiZANidine (ZANAFLEX) 2 mg, Oral, Every 8 hours PRN  ? vitamin B-12 (CYANOCOBALAMIN) 1,000 mcg, Oral, Daily  ? zolpidem (AMBIEN) 10 MG tablet Take one-half to one tablet by mouth at bedtime as needed for sleep  ? ? ?   ?Objective:  ? Physical Exam ?BP 128/78 (BP Location: Right Arm, Patient Position: Sitting, Cuff Size: Normal)   Pulse 65   Temp 98.2 ?F (36.8 ?C) (Oral)   Resp (!) 97   Ht '5\' 4"'$  (1.626 m)   Wt 185 lb 3.2 oz (84 kg)   SpO2 97%   BMI 31.79 kg/m?  ?General: ?Well developed, NAD, BMI noted ?Neck: No  thyromegaly  ?HEENT:  ?Normocephalic . Face symmetric, atraumatic ?Chest wall: Status post extended surgery, well-healed surgical scar, no abnormalities found ?Lungs:   ?CTA B ?Normal respiratory effort, no intercostal retractions, no accessory muscle use. ?Heart: RRR, + murmur.  ?Abdomen:  ?Not distended, soft, non-tender. No rebound or rigidity.   ?Lower extremities: no pretibial edema bilaterally  ?Skin: Exposed areas without rash. Not pale. Not jaundice ?Neurologic:  ?alert & oriented X3.  ?Speech normal, gait appropriate for age and unassisted ?Strength symmetric and appropriate for age.  ?Psych: ?Cognition and judgment appear intact.  ?Cooperative with normal attention span and concentration.  ?Behavior  appropriate. ?No anxious or depressed appearing. ? ?   ?Assessment   ? ? Assessment ?Hyperglycemia A1c 5.9 (04/2017) ?CV:Dr. Caryl Comes and [facility] ?--CHF, nonischemic cardiomyopathy:due to chemotherapy ?--MR severe ?--VT ?-TIA 05-2017 ?Depression, insomnia ?Mild OSA per sleep study 11-2015, saw Dr Radford Pax 02-20-16: no need for CPAP ?Osteopenia-- DEXA 06/13/2015 normal ?Vitamin D deficiency ?Kidney stones ?Menopause ?Breast cancer x 2: first in 1982 with chest wall involvement, 2nd on the R dx  1987, s/p B mastectomy; + BRACA ?Handicap sticker signed [phone] ?Post herpetic neuralgia (shingles 2014) , R face-- gaba prn ? ?PLAN: ?Here for CPX ?CHF: on lasix, KCL, Entresto, Aldactone; occasionally  BP in the low side, rec to minitor BPs, see AVS ?Hyperglycemia: check a A1C. ?Other problems seem stable ?RTC 6 m ? ? ? ?This visit occurred during the SARS-CoV-2 public health emergency.  Safety protocols were in place, including screening questions prior to the visit, additional usage of staff PPE, and extensive cleaning of exam room while observing appropriate contact time as indicated for disinfecting solutions.  ? ?

## 2022-01-27 NOTE — Patient Instructions (Signed)
Check the  blood pressure regularly ?BP GOAL is between 110/65 and  135/85. ?If it is consistently higher or lower, let me know ? ?Recommended vaccines: ?Tdap ?Shingles ?COVID booster ? ? ?GO TO THE LAB : Get the blood work   ? ? ?[place], [place] ?Come back for a checkup in 6 months ? ? ? ?"Living will", "Health Care Power of attorney": Advanced care planning ? ?(If you already have a living will or healthcare power of attorney, please bring the copy to be scanned in your chart.) ? ?Advance care planning is a process that supports adults in  understanding and sharing their preferences regarding future medical care.  ? ?The patient's preferences are recorded in documents called Advance Directives.    ?Advanced directives are completed (and can be modified at any time) while the patient is in full mental capacity.  ? ?The documentation should be available at all times to the patient, the family and the healthcare providers.  ?Bring in a copy to be scanned in your chart is an excellent idea and is recommended  ? ?This legal documents direct treatment decision making and/or appoint a surrogate to make the decision if the patient is not capable to do so.  ? ? ?Advance directives can be documented in many types of formats,  documents have names such as:  ?Lliving will  ?Durable power of attorney for healthcare (healthcare proxy or healthcare power of attorney)  ?Combined directives  ?Physician orders for life-sustaining treatment  ?  ?More information at: ? ?[email or web address]  ?

## 2022-01-28 NOTE — Assessment & Plan Note (Signed)
-  Td 2013.  Booster recommended ?-  pnm 23:  2017;  prevnar:2015.  PNM 20 today 01/27/2022 ?- zostavax 2013 ?- shingrix: recommended  ?- Covid vacc : booster rec ?- had a flu shot  per pt   ?- Cscope 2003, and 11-2011, next 10 years (Dr Deatra Ina).  Refer to GI ?-Female care: s/p oophorectomy, no previous abnormal PAPs, last PAP 2018 . No further cervical ca screening  ?-MMG-- h/o B mastectomy , self chest examination normal, clinical chest wall exam normal. ?-DEXA 2016, DEXA 12/2019: Tscore  (-)  1.1.  Recommend vitamin D supplements ?-Labs reviewed, check a BMP, CBC, A1c TSH, ?-She remains active and is trying to eat healthy ?- -ACP d/w pt  ?  ?

## 2022-01-28 NOTE — Assessment & Plan Note (Signed)
Here for CPX ?CHF: on lasix, KCL, Entresto, Aldactone; occasionally  BP in the low side, rec to minitor BPs, see AVS ?Hyperglycemia: check a A1C. ?Other problems seem stable ?RTC 6 m ?

## 2022-02-24 ENCOUNTER — Encounter: Payer: Self-pay | Admitting: Gastroenterology

## 2022-02-28 ENCOUNTER — Other Ambulatory Visit (HOSPITAL_COMMUNITY): Payer: Self-pay | Admitting: Internal Medicine

## 2022-03-07 ENCOUNTER — Ambulatory Visit (INDEPENDENT_AMBULATORY_CARE_PROVIDER_SITE_OTHER): Payer: Medicare Other

## 2022-03-07 DIAGNOSIS — I5022 Chronic systolic (congestive) heart failure: Secondary | ICD-10-CM | POA: Diagnosis not present

## 2022-03-10 LAB — CUP PACEART REMOTE DEVICE CHECK
Battery Remaining Longevity: 12 mo
Battery Remaining Percentage: 22 %
Brady Statistic RA Percent Paced: 0 %
Brady Statistic RV Percent Paced: 100 %
Date Time Interrogation Session: 20230416202600
HighPow Impedance: 56 Ohm
Implantable Lead Implant Date: 20050701
Implantable Lead Implant Date: 20050701
Implantable Lead Implant Date: 20140314
Implantable Lead Location: 753858
Implantable Lead Location: 753859
Implantable Lead Location: 753860
Implantable Lead Model: 158
Implantable Lead Model: 4196
Implantable Lead Model: 5076
Implantable Lead Serial Number: 156416
Implantable Pulse Generator Implant Date: 20140314
Lead Channel Impedance Value: 482 Ohm
Lead Channel Impedance Value: 491 Ohm
Lead Channel Impedance Value: 575 Ohm
Lead Channel Pacing Threshold Amplitude: 0.6 V
Lead Channel Pacing Threshold Amplitude: 0.7 V
Lead Channel Pacing Threshold Amplitude: 0.9 V
Lead Channel Pacing Threshold Pulse Width: 0.4 ms
Lead Channel Pacing Threshold Pulse Width: 0.4 ms
Lead Channel Pacing Threshold Pulse Width: 0.6 ms
Lead Channel Setting Pacing Amplitude: 2 V
Lead Channel Setting Pacing Amplitude: 2 V
Lead Channel Setting Pacing Amplitude: 2 V
Lead Channel Setting Pacing Pulse Width: 0.4 ms
Lead Channel Setting Pacing Pulse Width: 0.6 ms
Lead Channel Setting Sensing Sensitivity: 0.5 mV
Lead Channel Setting Sensing Sensitivity: 1 mV
Pulse Gen Serial Number: 111235

## 2022-03-12 ENCOUNTER — Telehealth (HOSPITAL_COMMUNITY): Payer: Self-pay | Admitting: *Deleted

## 2022-03-12 NOTE — Telephone Encounter (Signed)
Pt aware and thanked me for the call.  

## 2022-03-12 NOTE — Telephone Encounter (Signed)
Pt left vm asking if she is due for another echo. Last office note only mentions follow up and does not state when echo is due. ? ?Routed to [place] for advice  ?

## 2022-03-14 ENCOUNTER — Telehealth: Payer: Self-pay | Admitting: Internal Medicine

## 2022-03-17 NOTE — Telephone Encounter (Signed)
Requesting: Ambien '10mg'$   ?Contract: 09/20/21 ?UDS: None ?Last Visit: 01/27/22 ?Next Visit: 08/04/22 ?Last Refill: 08/09/21 #90 and 1RF ?Please Advise ? ?

## 2022-03-17 NOTE — Telephone Encounter (Signed)
PDMP okay, Rx sent 

## 2022-03-24 ENCOUNTER — Encounter: Payer: Self-pay | Admitting: Gastroenterology

## 2022-03-24 ENCOUNTER — Ambulatory Visit: Payer: Medicare Other | Admitting: Gastroenterology

## 2022-03-24 VITALS — BP 114/52 | HR 70 | Ht 63.5 in | Wt 184.1 lb

## 2022-03-24 DIAGNOSIS — Z7902 Long term (current) use of antithrombotics/antiplatelets: Secondary | ICD-10-CM

## 2022-03-24 DIAGNOSIS — I428 Other cardiomyopathies: Secondary | ICD-10-CM

## 2022-03-24 DIAGNOSIS — Z1211 Encounter for screening for malignant neoplasm of colon: Secondary | ICD-10-CM | POA: Diagnosis not present

## 2022-03-24 MED ORDER — NA SULFATE-K SULFATE-MG SULF 17.5-3.13-1.6 GM/177ML PO SOLN: 1.0000 | Freq: Once | ORAL | 0 refills | Status: AC

## 2022-03-24 NOTE — Patient Instructions (Signed)
If you are age 73 or older, your body mass index should be between 23-30. Your Body mass index is 32.1 kg/m?Marland Kitchen If this is out of the aforementioned range listed, please consider follow up with your Primary Care Provider. ? ?If you are age 46 or younger, your body mass index should be between 19-25. Your Body mass index is 32.1 kg/m?Marland Kitchen If this is out of the aformentioned range listed, please consider follow up with your Primary Care Provider.  ? ?________________________________________________________ ? ?The [facility] providers would like to encourage you to use [facility] to communicate with providers for non-urgent requests or questions.  Due to long hold times on the telephone, sending your provider a message by [facility] may be a faster and more efficient way to get a response.  Please allow 48 business hours for a response.  Please remember that this is for non-urgent requests.  ?_______________________________________________________ ? ?You have been scheduled for a colonoscopy. Please follow written instructions given to you at your visit today.  ?Please pick up your prep supplies at the pharmacy within the next 1-3 days. ?If you use inhalers (even only as needed), please bring them with you on the day of your procedure. ? ?Due to recent changes in healthcare laws, you may see the results of your imaging and laboratory studies on MyChart before your provider has had a chance to review them.  We understand that in some cases there may be results that are confusing or concerning to you. Not all laboratory results come back in the same time frame and the provider may be waiting for multiple results in order to interpret others.  Please give [place] 48 hours in order for your provider to thoroughly review all the results before contacting the office for clarification of your results.  ? ?It was a pleasure to see you today! ? ?Thank you for trusting me with your gastrointestinal care!   ? ? ?

## 2022-03-24 NOTE — Progress Notes (Signed)
? ? ?[facility] Consult Note: ? ?History: ?[name] ?03/24/2022 ? ?Referring provider: Colon Branch, MD ? ?Reason for consult/chief complaint: Colon Cancer Screening (Patient on Plavix) ? ? ?Subjective  ?HPI: ?I last saw [name] in January 2019 after previously seeing Dr. Deatra Ina for diverticulitis (last episode around 2013), episodic brief episodes of right lower quadrant pain and last screening colonoscopy January 2013. ?  ?Don is doing quite well lately and denies abdominal pain altered bowel habits or rectal bleeding.  She denies chest pain or dyspnea and remains active caring for her grandchildren. ?I reviewed her last cardiology note as indicated below. ? ? ?ROS: ? ?Review of Systems ?Denies chest pain dyspnea or dysuria ? ?Past Medical History: ?Past Medical History:  ?Diagnosis Date  ? Arthritis   ? Breast CA ([place])   ? twice first on L breast 1982 w/ chest wall involvment, then a second breast ca on the R in 1987  ? Chronic systolic CHF (congestive heart failure) ([place])   ? a. due to Adriamycin,s/p ICD;  b. 01/2013 Gen change and new LV lead - BSX Energen CRT-D BiV ICD, Ser # [id number]  ? Depression   ? Diverticulitis   ? Early menopause   ? early 27s  ? Fatigue 02/20/2016  ? Glaucoma suspect of both eyes   ? Dr. Marshall Cork  ? Nonischemic cardiomyopathy ([place])   ? OSA (obstructive sleep apnea) 02/07/2016  ? Osteopenia   ? Renal calculus   ? Snoring 02/20/2016  ? TIA (transient ischemic attack)   ? ?Dr. 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  LHC 11/14 without significant coronary disease.    Has [employer] CRT-D. ECHO 09/2013 EF 10-15%. [facility] 4/16 EF 15% moderate AI.  ?- Echo 9/17 EF 20-25%.  RV ok. Severe MR mild AI/AS  Seen in [facility] and will follow prn ?- Echo 04/2017 LVEF 20-25%, Grade 1 DD. ?-  Echo 12/19 EF 20-25% RV ok ?- Echo 6/21 EF improved to 40-45% ?- Echo today EF 40-45% Mild AS Moderate AI and moderate functional MR ?- Stable NYHA II ?- Volume status looks goo done xam ?- ICD interrogated personally. No VT/AF Volume ok.  ?- Continue lasix 80 mg q am and 40 mg q pm.  ?- Previously intolerant of med titration due to low BP and refused Farxiga due to cost ?- BP now up. We discussed adding [place] or increasing Entresto.She wants to increase Entresto ?- Increase Entresto 49/51 mg BID ?- Continue carvedilol 6.25/12.5 ?- Continue spironolactone 25 mg daily. ?- Consider Farxiga at next visit ?- She has aged out of transplant consideration and says she would not want an LVAD if she got worse.  ?- Labs today.  ?2) Valvular heart disease ?- echo today mild AS mod AI and moderate functional MR ?- Follow closely. Repeat echo in 6 months ?- SBE prophylaxis ?- Consider TEE in near future if no improvement ?3) h/o bilateral breast CA in 1982 and 1987 (treated with Adriamycin in 1982) ?- stable. No change ?4) ICD  ?- follows with Dr. Caryl Comes. Interrogation today as above ?5) h/o TIA.  ?- Continue Plavix and statin. No recent Neuro symptoms. No change ?  ?  ?Glori Bickers, MD ?08/20/2021" ? ? ?Past Surgical History: ?Past Surgical History:  ?Procedure Laterality Date  ? BI-VENTRICULAR IMPLANTABLE CARDIOVERTER DEFIBRILLATOR UPGRADE N/A 02/04/2013  ?  Procedure: BI-VENTRICULAR IMPLANTABLE CARDIOVERTER DEFIBRILLATOR UPGRADE;  Surgeon: Evans Lance, MD;  Location: [facility] CATH LAB;  Service: Cardiovascular;  Laterality: N/A;  ? CARDIAC DEFIBRILLATOR PLACEMENT    ? AICD Replaced-5/09 and 2014  ? LEFT AND RIGHT HEART CATHETERIZATION WITH CORONARY ANGIOGRAM N/A 10/06/2013  ? Procedure: LEFT AND RIGHT HEART CATHETERIZATION WITH CORONARY ANGIOGRAM;  Surgeon: Jolaine Artist, MD;  Location: [facility] CATH LAB;  Service: Cardiovascular;  Laterality: N/A;  ? LITHOTRIPSY    ? h/o several procedures   ? MASTECTOMY Bilateral   ?  OOPHORECTOMY Bilateral   ? triger finger, R ring finger Right 02/2019  ? VENOGRAM N/A 10/20/2012  ? Procedure: VENOGRAM;  Surgeon: Deboraha Sprang, MD;  Location: [facility] CATH LAB;  Service: Cardiovascular;  Laterality: N/A;  ? ? ? ?Family History: ?Family History  ?Problem Relation Age of Onset  ? Breast cancer Mother   ?     M and sister   ? Lung cancer Father   ? Breast cancer Sister   ? Kidney cancer Brother   ? Breast cancer Maternal Grandmother   ? Heart attack Neg Hx   ? Diabetes Neg Hx   ? Colon cancer Neg Hx   ? ? ?Social History: ?Social History  ? ?Socioeconomic History  ? Marital status: Married  ?  Spouse name: Enriqueta Shutter  ? Number of children: 3  ? Years of education: Not on file  ? Highest education level: Not on file  ?Occupational History  ? Occupation: RETIRED---pre Librarian, academic   ?  Employer: [employer]  ?  Comment: preschool  ?Tobacco Use  ? Smoking status: Never  ? Smokeless tobacco: Never  ?Vaping Use  ? Vaping Use: Never used  ?Substance and Sexual Activity  ? Alcohol use: Yes  ?  Comment: socially/occassionally  ? Drug use: No  ? Sexual activity: Not on file  ?Other Topics Concern  ? Not on file  ?Social History Narrative  ? Related to Mr and Mrs Huston Foley, they are my patients as well   ? Married, 3 children all in [place], 7 Gkids    ? Siblings, 2 deceased, 2 living brothers   ? ?Social Determinants of Health  ? ?Financial Resource Strain: Low Risk   ? Difficulty of Paying Living Expenses: Not hard at all  ?Food Insecurity: No Food Insecurity  ? Worried About Charity fundraiser in the Last Year: Never true  ? Ran Out of Food in the Last Year: Never true  ?Transportation Needs: No Transportation Needs  ? Lack of Transportation (Medical): No  ? Lack of Transportation (Non-Medical): No  ?Physical Activity: Sufficiently Active  ? Days of Exercise per Week: 5 days  ? Minutes of Exercise per Session: 30 min  ?Stress: No Stress Concern Present  ? Feeling of Stress : Not at all  ?Social Connections:  Socially Integrated  ? Frequency of Communication with Friends and Family: More than three times a week  ? Frequency of Social Gatherings with Friends and Family: More than three times a week  ? Attends Religious Services: More than 4 times per year  ? Active Member of Clubs or Organizations: Yes  ? Attends Archivist Meetings: Not on file  ? Marital Status: Married  ? ? ?Allergies: ?Allergies  ?Allergen Reactions  ? Atacand [Candesartan] Other (See Comments)  ?  Causes fatigue ?  ? Keflex [Cephalexin] Hives and Itching  ? Latex Rash  ? ? ?Outpatient Meds: ?Current Outpatient Medications  ?Medication  Sig Dispense Refill  ? ascorbic acid (VITAMIN C) 1000 MG tablet Take 1,000 mg by mouth daily.    ? atorvastatin (LIPITOR) 40 MG tablet TAKE ONE TABLET BY MOUTH ONE TIME DAILY 90 tablet 1  ? Biotin 5000 MCG CAPS Take 5,000 mcg by mouth daily.     ? carvedilol (COREG) 12.5 MG tablet take 1 tablet by mouth in the morning and 1 tablet at night 180 tablet 3  ? clopidogrel (PLAVIX) 75 MG tablet TAKE ONE TABLET BY MOUTH ONE TIME DAILY 90 tablet 3  ? furosemide (LASIX) 40 MG tablet Take 2 tablets (80 mg total) by mouth every morning AND 1 tablet (40 mg total) every evening. 270 tablet 3  ? gabapentin (NEURONTIN) 300 MG capsule Take 300 mg by mouth daily as needed (for shingles flares).     ? multivitamin (THERAGRAN) per tablet Take 1 tablet by mouth daily.    ? pantoprazole (PROTONIX) 40 MG tablet TAKE ONE TABLET BY MOUTH ONE TIME DAILY 90 tablet 1  ? potassium chloride (KLOR-CON) 10 MEQ tablet TAKE 3 TABLETS BY MOUTH ONCE A DAY 270 tablet 3  ? Probiotic Product (PROBIOTIC DAILY PO) Take 1 capsule by mouth daily.     ? sacubitril-valsartan (ENTRESTO) 49-51 MG Take 1 tablet by mouth 2 (two) times daily. 180 tablet 3  ? sertraline (ZOLOFT) 25 MG tablet TAKE ONE TABLET BY MOUTH ONE TIME DAILY 90 tablet 1  ? spironolactone (ALDACTONE) 25 MG tablet TAKE ONE TABLET BY MOUTH ONE TIME DAILY 90 tablet 3  ? tiZANidine  (ZANAFLEX) 2 MG tablet Take 1 tablet (2 mg total) by mouth every 8 (eight) hours as needed for muscle spasms. 21 tablet 0  ? vitamin B-12 (CYANOCOBALAMIN) 1000 MCG tablet Take 1,000 mcg by mouth daily.    ? zolpidem (AMBIEN) 10

## 2022-03-25 NOTE — Progress Notes (Signed)
Remote ICD transmission.   

## 2022-04-25 ENCOUNTER — Other Ambulatory Visit (HOSPITAL_COMMUNITY): Payer: Self-pay | Admitting: Internal Medicine

## 2022-04-25 ENCOUNTER — Other Ambulatory Visit: Payer: Self-pay | Admitting: Internal Medicine

## 2022-05-05 ENCOUNTER — Encounter (HOSPITAL_COMMUNITY): Payer: Self-pay | Admitting: Internal Medicine

## 2022-05-05 ENCOUNTER — Ambulatory Visit (HOSPITAL_COMMUNITY)
Admission: RE | Admit: 2022-05-05 | Discharge: 2022-05-05 | Disposition: A | Discharge status: Home / Self Care | Payer: Medicare Other | Source: Ambulatory Visit | Attending: Internal Medicine | Admitting: Internal Medicine

## 2022-05-05 VITALS — BP 124/60 | HR 65 | Wt 181.8 lb

## 2022-05-05 DIAGNOSIS — Z8673 Personal history of transient ischemic attack (TIA), and cerebral infarction without residual deficits: Secondary | ICD-10-CM | POA: Insufficient documentation

## 2022-05-05 DIAGNOSIS — I428 Other cardiomyopathies: Secondary | ICD-10-CM | POA: Insufficient documentation

## 2022-05-05 DIAGNOSIS — I519 Heart disease, unspecified: Secondary | ICD-10-CM | POA: Diagnosis not present

## 2022-05-05 DIAGNOSIS — Z79899 Other long term (current) drug therapy: Secondary | ICD-10-CM | POA: Insufficient documentation

## 2022-05-05 DIAGNOSIS — I5022 Chronic systolic (congestive) heart failure: Secondary | ICD-10-CM | POA: Diagnosis not present

## 2022-05-05 DIAGNOSIS — Z95810 Presence of automatic (implantable) cardiac defibrillator: Secondary | ICD-10-CM | POA: Insufficient documentation

## 2022-05-05 DIAGNOSIS — Z853 Personal history of malignant neoplasm of breast: Secondary | ICD-10-CM | POA: Diagnosis not present

## 2022-05-05 DIAGNOSIS — Z9012 Acquired absence of left breast and nipple: Secondary | ICD-10-CM | POA: Insufficient documentation

## 2022-05-05 DIAGNOSIS — Z7984 Long term (current) use of oral hypoglycemic drugs: Secondary | ICD-10-CM | POA: Insufficient documentation

## 2022-05-05 DIAGNOSIS — F32A Depression, unspecified: Secondary | ICD-10-CM | POA: Insufficient documentation

## 2022-05-05 DIAGNOSIS — I351 Nonrheumatic aortic (valve) insufficiency: Secondary | ICD-10-CM | POA: Diagnosis not present

## 2022-05-05 DIAGNOSIS — Z9221 Personal history of antineoplastic chemotherapy: Secondary | ICD-10-CM | POA: Diagnosis not present

## 2022-05-05 DIAGNOSIS — I340 Nonrheumatic mitral (valve) insufficiency: Secondary | ICD-10-CM | POA: Diagnosis not present

## 2022-05-05 DIAGNOSIS — Z7902 Long term (current) use of antithrombotics/antiplatelets: Secondary | ICD-10-CM | POA: Insufficient documentation

## 2022-05-05 LAB — BASIC METABOLIC PANEL
Anion gap: 7 (ref 5–15)
BUN: 11 mg/dL (ref 8–23)
CO2: 27 mmol/L (ref 22–32)
Calcium: 9.8 mg/dL (ref 8.9–10.3)
Chloride: 104 mmol/L (ref 98–111)
Creatinine, Ser: 0.8 mg/dL (ref 0.44–1.00)
GFR, Estimated: >60 mL/min (ref >60)
Glucose, Bld: 115 mg/dL — ABNORMAL HIGH (ref 70–99)
Potassium: 4.3 mmol/L (ref 3.5–5.1)
Sodium: 138 mmol/L (ref 135–145)

## 2022-05-05 MED ORDER — FUROSEMIDE 40 MG PO TABS: 40.0000 mg | ORAL_TABLET | Freq: Every day | ORAL | 3 refills | Status: DC

## 2022-05-05 MED ORDER — EMPAGLIFLOZIN 10 MG PO TABS: 10.0000 mg | ORAL_TABLET | Freq: Every day | ORAL | 11 refills | Status: DC

## 2022-05-05 NOTE — Patient Instructions (Signed)
Start Jardiance '10mg'$  daily.  Decresae Lasix to '40mg'$  daily.  Labs done today, your results will be available in MyChart, we will contact you for abnormal readings.  Your physician has requested that you have an echocardiogram. Echocardiography is a painless test that uses sound waves to create images of your heart. It provides your doctor with information about the size and shape of your heart and how well your heart's chambers and valves are working. This procedure takes approximately one hour. There are no restrictions for this procedure.   Your physician recommends that you schedule a follow-up appointment in: 4 months with an echocardiogram ( October 2023)  **please call the office in August to schedule your follow up **  If you have any questions or concerns before your next appointment please send [place] a message through [place] or call our office at [phone].    TO LEAVE A MESSAGE FOR THE NURSE SELECT OPTION 2, PLEASE LEAVE A MESSAGE INCLUDING: YOUR NAME DATE OF BIRTH CALL BACK NUMBER REASON FOR CALL**this is important as we prioritize the call backs  YOU WILL RECEIVE A CALL BACK THE SAME DAY AS LONG AS YOU CALL BEFORE 4:00 PM  At the [facility], you and your health needs are our priority. As part of our continuing mission to provide you with exceptional heart care, we have created designated Provider Care Teams. These Care Teams include your primary Cardiologist (physician) and Advanced Practice Providers (APPs- Physician Assistants and Nurse Practitioners) who all work together to provide you with the care you need, when you need it.   You may see any of the following providers on your designated Care Team at your next follow up: Dr Glori Bickers Dr Haynes Kerns, NP Lyda Jester, [place] [facility] [place], [place] Audry Riles, PharmD   Please be sure to bring in all your medications bottles to every appointment.

## 2022-05-05 NOTE — Progress Notes (Addendum)
ADVANCED HEART FAILURE CLINIC NOTE  Patient ID: [name], female   DOB: 06-Apr-1951, 71 y.o.   MRN: [id number] PCP: Dr Larose Kells EP: Dr Caryl Comes  Subjective [name] is a 71 y/o woman with h/o chronic systolic presumed due to chemotherapy-related cardiomyopathy, left breast cancer in 1982 treated with chemo (including adriamycin)/XRT and L mastectomy. Had R breast cancer (unrelated) in 1987. Has been cancer free since. Denies HTN, HL, DM2.  Was first diagnosed with HF in 1988. Had left heart cath she thinks in 2009. 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 Echo 2009 EF 20% with moderate AI and mild to moderate MR. Had [employer] ICD placed and then LV lead became nonfunctional. In 3/14, underwent CRT revision.   Had sleep study 1/17 very mild OSA (6.2/hr) not using CPAP.   Admitted in 6/18 withTIA. Unable to use right hand and speech slurred. CT normal. Unable to do MRI.   Today she returns for HF follow up.Overall feeling fine. Mild SOB with exertion. Denies PND/Orthopnea. Appetite ok. No fever or chills. Weight at home  has been stable. Asking about weight loss medications. Taking all medications .  ICD interrogated personally: No VT/VF No AF.  Activity level 3 hours per day. Personally reviewed  ECHO 09/2013 with Dr. Wynonia Lawman and EF 10-15% with significant dyssynchrony. Subsequently had LV lead checked and was functioning well.  ECHO 7/15 EF 10-15%, moderate to severe RV dysfunction ECHO 4/16 EF 15% normal RV moderate AI ECHO 9/17 EF 20-25% RV ok. Severe MR mild AI/AS Echo 6/18 EF 20-25% RV ok Mild to moderate AI  Echo 12/19 EF 25% RV ok  Echo 08/20/21: EF 40-45% Mild AS Mod AI Mod MR Personally reviewed  CPX 2/19 FVC 3.41  (117%)      FEV1 2.45 (108%)        FEV1/FVC 72 (92%)        MVV 78 (89%)      Resting HR: 74 Peak HR: 121   (80% age predicted max HR) BP rest: 108/56 BP peak: 136/58 Peak VO2: 18.3 (103% predicted peak VO2) VE/VCO2 slope:   30 OUES: 2.45 Peak RER: 0.95  CPX 10/17  FVC 3.44 (113%)      FEV1 2.32 (97%)        FEV1/FVC 67 (86%)        MVV 90 (101%)      Resting HR: 72 Peak HR: 112 (73% age predicted max HR)  BP rest: 104/64 BP peak: 146/64 Peak VO2: 16.0 (91% predicted peak VO2) VE/VCO2 slope:  34.6 OUES: 1.90 Peak RER: 1.10 Ventilatory Threshold: 13.5 (76.9% predicted or measured peak VO2) VE/MVV:  64% PETCO2 at peak:  29 O2pulse:  13   (130% predicted O2pulse)  LHC/RHC 10/06/13  RA = 11  RV = 54/9/13  PA = 53/27 (38)  PCW = 23  Fick cardiac output/index = 3.8/2.0  Thermo CO/CI = 3.4/1.8  PVR = 3.9 WU (Fick)  FA sat = 98%  PA sat = 62%, 64%  SVC = 59%  Coronaries no significant disease but there was a fistula from small D1 => PA.   CPX 10/25/13  Peak VO2: 15.6 (81.2% predicted peak VO2) VE/VCO2 slope: 36.9 OUES: 1.25 Peak RER: 1.08  CPX 05/24/14 FVC 3.05 (92%)  FEV1 2.23 (88%)  FEV1/FVC 73%  MVV 92 (100%) Resting HR: 64 Peak HR: 132 (85% age predicted max HR) BP rest: 108/70 BP peak: 130/66  Peak VO2: 16.4 (81.1% predicted peak  VO2) VE/VCO2 slope: 33.6 OUES: 1.74 Peak RER: 1.16 Ventilatory Threshold: 12.5 (61.8% predicted peak VO2) VE/MVV: 62% PETCO2 at peak: 31 O2pulse: 12 (109% predicted O2pulse)   Blood type A+   FHX: Had older brother with HF in his 16s. No strong FHx of HF.   SocHx: Retired. Non-smoker. Rare ETOH. 3 grown kids.   Review of systems complete and found to be negative unless listed in HPI.    Past Medical History:  Diagnosis Date   Arthritis    Breast CA ([place])    twice first on L breast 1982 w/ chest wall involvment, then a second breast ca on the R in 0938   Chronic systolic CHF (congestive heart failure) (HCC)    a. due to Adriamycin,s/p ICD;  b. 01/2013 Gen change and new LV lead - BSX [employer] CRT-D BiV ICD, Ser # [id number]   Depression    Diverticulitis    Early menopause    early 30s   Fatigue 02/20/2016   Glaucoma suspect of both eyes    Dr.  Marshall Cork   Nonischemic cardiomyopathy [facility])    OSA (obstructive sleep apnea) 02/07/2016   Osteopenia    Renal calculus    Snoring 02/20/2016   TIA (transient ischemic attack)     Past Surgical History:  Procedure Laterality Date   BI-VENTRICULAR IMPLANTABLE CARDIOVERTER DEFIBRILLATOR UPGRADE N/A 02/04/2013   Procedure: BI-VENTRICULAR IMPLANTABLE CARDIOVERTER DEFIBRILLATOR UPGRADE;  Surgeon: Evans Lance, MD;  Location: [facility] CATH LAB;  Service: Cardiovascular;  Laterality: N/A;   CARDIAC DEFIBRILLATOR PLACEMENT     AICD Replaced-5/09 and 2014   LEFT AND RIGHT HEART CATHETERIZATION WITH CORONARY ANGIOGRAM N/A 10/06/2013   Procedure: LEFT AND RIGHT HEART CATHETERIZATION WITH CORONARY ANGIOGRAM;  Surgeon: Jolaine Artist, MD;  Location: [facility] CATH LAB;  Service: Cardiovascular;  Laterality: N/A;   LITHOTRIPSY     h/o several procedures    MASTECTOMY Bilateral    OOPHORECTOMY Bilateral    triger finger, R ring finger Right 02/2019   VENOGRAM N/A 10/20/2012   Procedure: VENOGRAM;  Surgeon: Deboraha Sprang, MD;  Location: [facility] CATH LAB;  Service: Cardiovascular;  Laterality: N/A;    Current Outpatient Medications  Medication Sig Dispense Refill   amoxicillin (AMOXIL) 500 MG capsule Take 4 capsules 30 minutes before dental proedure. 4 capsule 0   ascorbic acid (VITAMIN C) 1000 MG tablet Take 1,000 mg by mouth daily.     atorvastatin (LIPITOR) 40 MG tablet TAKE ONE TABLET BY MOUTH ONE TIME DAILY 90 tablet 1   Biotin 5000 MCG CAPS Take 5,000 mcg by mouth daily.      carvedilol (COREG) 12.5 MG tablet Take 1 tablet by mouth in the morning and take 1 tablet at night 180 tablet 3   clopidogrel (PLAVIX) 75 MG tablet TAKE ONE TABLET BY MOUTH ONE TIME DAILY 90 tablet 3   furosemide (LASIX) 40 MG tablet Take 2 tablets (80 mg total) by mouth every morning AND 1 tablet (40 mg total) every evening. 270 tablet 3   gabapentin (NEURONTIN) 300 MG capsule Take 300 mg by mouth daily as needed (for  shingles flares).      multivitamin (THERAGRAN) per tablet Take 1 tablet by mouth daily.     pantoprazole (PROTONIX) 40 MG tablet TAKE ONE TABLET BY MOUTH ONE TIME DAILY 90 tablet 1   potassium chloride (KLOR-CON) 10 MEQ tablet TAKE 3 TABLETS BY MOUTH ONCE A DAY 270 tablet 3   Probiotic Product (PROBIOTIC DAILY PO) Take  1 capsule by mouth daily.      sacubitril-valsartan (ENTRESTO) 49-51 MG Take 1 tablet by mouth 2 (two) times daily. 180 tablet 3   sertraline (ZOLOFT) 25 MG tablet TAKE ONE TABLET BY MOUTH ONE TIME DAILY 90 tablet 1   spironolactone (ALDACTONE) 25 MG tablet TAKE ONE TABLET BY MOUTH ONE TIME DAILY 90 tablet 3   tiZANidine (ZANAFLEX) 2 MG tablet Take 1 tablet (2 mg total) by mouth every 8 (eight) hours as needed for muscle spasms. 21 tablet 0   vitamin B-12 (CYANOCOBALAMIN) 1000 MCG tablet Take 1,000 mcg by mouth daily.     zolpidem (AMBIEN) 10 MG tablet TAKE ONE-HALF TO ONE TABLET BY MOUTH AT BEDTIME AS NEEDED FOR SLEEP 90 tablet 1   No current facility-administered medications for this encounter.   Allergies  Allergen Reactions   Atacand [Candesartan] Other (See Comments)    Causes fatigue    Keflex [Cephalexin] Hives and Itching   Latex Rash    Vitals:   05/05/22 1143  BP: 124/60  Pulse: 65  SpO2: 97%  Weight: 82.5 kg (181 lb 12.8 oz)   Wt Readings from Last 3 Encounters:  05/05/22 82.5 kg (181 lb 12.8 oz)  03/24/22 83.5 kg (184 lb 2 oz)  01/27/22 84 kg (185 lb 3.2 oz)   General:  Well appearing. No resp difficulty HEENT: normal Neck: supple. no JVD. Carotids 2+ bilat; no bruits. No lymphadenopathy or thryomegaly appreciated. Cor: PMI nondisplaced. Regular rate & rhythm. No rubs, gallops or murmurs. Lungs: clear Abdomen: soft, nontender, nondistended. No hepatosplenomegaly. No bruits or masses. Good bowel sounds. Extremities: no cyanosis, clubbing, rash, edema Neuro: alert & orientedx3, cranial nerves grossly intact. moves all 4 extremities w/o difficulty.  Affect pleasant   Assessment and  Plan  1) Chronic systolic HF:  - Nonischemic cardiomyopathy, possibly anthracycline cardiotoxicity.  LHC 11/14 without significant coronary disease.    Has [employer] CRT-D. ECHO 09/2013 EF 10-15%. Echo at [facility] 4/16 EF 15% moderate AI.  - Echo 9/17 EF 20-25%.  RV ok. Severe MR mild AI/AS  Seen in [facility] and will follow prn - Echo 04/2017 LVEF 20-25%, Grade 1 DD. - Echo 12/19 EF 20-25% RV ok - Echo 6/21 EF improved to 40-45% - Echo 2022 EF 40-45% Mild AS Moderate AI and moderate functional MR -  ICD interrogated personally. No VT/AF Volume ok.  - NYHA II. Volume status stable. With addition of SGLT2i will cut back lasix to 40 mg daily.   - Continue Entresto 49/51 mg BID - Continue carvedilol 6.25/12.5 - Continue spironolactone 25 mg daily. - Add SLGT2i, jardiance 10 mg daily.  - Repeat ECHO at next visit.  2) Valvular heart disease - echo 07/2021 mild AS mod AI and moderate functional MR - Follow closely. Repeat echo next visit.  - SBE prophylaxis - Consider TEE in near future if no improvement 3) h/o bilateral breast CA in 1982 and 1987 (treated with Adriamycin in 1982) - stable. No change 4) ICD  - follows with Dr. Caryl Comes. 5) h/o TIA.  - Continue Plavix and statin. No recent Neuro symptoms. No change  Follow up in 4-5 months with Dr Haroldine Laws and an ECHO. Check BMET. Darrick Grinder, NP 05/05/2022  Patient seen and examined with the above-signed Advanced Practice Provider and/or Housestaff. I personally reviewed laboratory data, imaging studies and relevant notes. I independently examined the patient and formulated the important aspects of the plan. I have edited the note to reflect any  of my changes or salient points. I have personally discussed the plan with the patient and/or family.  Doing well. NYHA II. Volume status looks good.   General:  Well appearing. No resp difficulty HEENT: normal Neck: supple. no JVD. Carotids 2+  bilat; no bruits. No lymphadenopathy or thryomegaly appreciated. Cor: PMI nondisplaced. Regular rate & rhythm. No rubs, gallops or murmurs. Lungs: clear Abdomen: soft, nontender, nondistended. No hepatosplenomegaly. No bruits or masses. Good bowel sounds. Extremities: no cyanosis, clubbing, rash, edema Neuro: alert & orientedx3, cranial nerves grossly intact. moves all 4 extremities w/o difficulty. Affect pleasant  Doing well. Will add SGLT2i. Cut back lasix. Check labs today. ICD interrogated in clinic. No VT. Volume ok.   Will need repeat echo to follow valvular disease.   Glori Bickers, MD  5:27 PM

## 2022-05-30 ENCOUNTER — Telehealth: Payer: Self-pay

## 2022-05-30 NOTE — Telephone Encounter (Signed)
Returning your call. °

## 2022-05-30 NOTE — Telephone Encounter (Signed)
   Patient Name: [name]  DOB: 1951/07/29 MRN: [id number]  Primary Cardiologist: Bensimhon  Chart reviewed as part of pre-operative protocol coverage.   71 year old female with a history of chronic systolic heart failure, NICM (possibly anthracycline cardiotoxicity), s/p ICD, mild aortic stenosis, moderate aortic insufficiency, moderate mitral valve regurgitation, bilateral breast cancer, history of TIA.  She was last seen in the office on 05/05/2022 and was stable from a cardiac standpoint.    We received surgical clearance request for endoscopy scheduled for 2023 with request to hold Plavix for 5 days prior to procedure.  Patient takes Plavix for history of TIA, however, prescribing physician is listed as Dr. Haroldine Laws.  Dr. Haroldine Laws, please advise on holding Plavix prior to procedure.  Please route your response to P CV DIV PREOP.  Thank you.   Lenna Sciara, NP 05/30/2022, 9:14 AM

## 2022-05-30 NOTE — Telephone Encounter (Signed)
   Patient Name: [name]  DOB: 05-Jun-1951 MRN: [id number]  Primary Cardiologist: Bensimhon  Chart reviewed as part of pre-operative protocol coverage.  Per Dr. Haroldine Laws, primary cardiologist, patient may hold Plavix for 5 days prior to endoscopy procedure.   I will route this recommendation to the requesting party via Epic fax function and remove from pre-op pool.  Please call with questions.  Lenna Sciara, NP 05/30/2022, 12:28 PM

## 2022-05-30 NOTE — Telephone Encounter (Signed)
Patient has been notified and aware. She states understanding for holding the Plavix. No additional questions at this time

## 2022-05-30 NOTE — Telephone Encounter (Signed)
[facility] Pre-operative Risk Assessment     Request for surgical clearance:     Endoscopy Procedure  What type of surgery is being performed?     06-06-2022  When is this surgery scheduled?     06-06-2022  What type of clearance is required ?   Pharmacy  Are there any medications that need to be held prior to surgery and how long? Yes, Plavix 5 days  Practice name and name of physician performing surgery?      [facility]  What is your office phone and fax number?      Phone- [phone]  [phone]  Anesthesia type (None, local, MAC, general) ?       MAC

## 2022-06-03 ENCOUNTER — Encounter: Payer: Self-pay | Admitting: Gastroenterology

## 2022-06-06 ENCOUNTER — Encounter: Payer: Medicare Other | Admitting: Gastroenterology

## 2022-06-06 ENCOUNTER — Ambulatory Visit (INDEPENDENT_AMBULATORY_CARE_PROVIDER_SITE_OTHER): Payer: Medicare Other

## 2022-06-06 DIAGNOSIS — I428 Other cardiomyopathies: Secondary | ICD-10-CM

## 2022-06-17 LAB — CUP PACEART REMOTE DEVICE CHECK
Battery Remaining Longevity: 11 mo
Battery Remaining Percentage: 17 %
Brady Statistic RA Percent Paced: 0 %
Brady Statistic RV Percent Paced: 100 %
Date Time Interrogation Session: 20230721001200
HighPow Impedance: 59 Ohm
Implantable Lead Implant Date: 20050701
Implantable Lead Implant Date: 20050701
Implantable Lead Implant Date: 20140314
Implantable Lead Location: 753858
Implantable Lead Location: 753859
Implantable Lead Location: 753860
Implantable Lead Model: 158
Implantable Lead Model: 4196
Implantable Lead Model: 5076
Implantable Lead Serial Number: 156416
Implantable Pulse Generator Implant Date: 20140314
Lead Channel Impedance Value: 470 Ohm
Lead Channel Impedance Value: 499 Ohm
Lead Channel Impedance Value: 588 Ohm
Lead Channel Pacing Threshold Amplitude: 0.6 V
Lead Channel Pacing Threshold Amplitude: 0.7 V
Lead Channel Pacing Threshold Amplitude: 0.9 V
Lead Channel Pacing Threshold Pulse Width: 0.4 ms
Lead Channel Pacing Threshold Pulse Width: 0.4 ms
Lead Channel Pacing Threshold Pulse Width: 0.6 ms
Lead Channel Setting Pacing Amplitude: 2 V
Lead Channel Setting Pacing Amplitude: 2 V
Lead Channel Setting Pacing Amplitude: 2 V
Lead Channel Setting Pacing Pulse Width: 0.4 ms
Lead Channel Setting Pacing Pulse Width: 0.6 ms
Lead Channel Setting Sensing Sensitivity: 0.5 mV
Lead Channel Setting Sensing Sensitivity: 1 mV
Pulse Gen Serial Number: 111235

## 2022-06-19 NOTE — Progress Notes (Signed)
Remote ICD transmission.   

## 2022-06-22 ENCOUNTER — Encounter: Payer: Self-pay | Admitting: Certified Registered Nurse Anesthetist

## 2022-06-27 ENCOUNTER — Encounter: Payer: Self-pay | Admitting: Gastroenterology

## 2022-06-27 ENCOUNTER — Ambulatory Visit (AMBULATORY_SURGERY_CENTER): Payer: Medicare Other | Admitting: Gastroenterology

## 2022-06-27 VITALS — BP 121/61 | HR 69 | Temp 96.4°F | Resp 13 | Ht 63.5 in | Wt 184.0 lb

## 2022-06-27 DIAGNOSIS — D122 Benign neoplasm of ascending colon: Secondary | ICD-10-CM | POA: Diagnosis not present

## 2022-06-27 DIAGNOSIS — Z1211 Encounter for screening for malignant neoplasm of colon: Secondary | ICD-10-CM

## 2022-06-27 DIAGNOSIS — D123 Benign neoplasm of transverse colon: Secondary | ICD-10-CM | POA: Diagnosis not present

## 2022-06-27 MED ORDER — SODIUM CHLORIDE 0.9 % IV SOLN: 500.0000 mL | Freq: Once | INTRAVENOUS | Status: DC

## 2022-06-27 NOTE — Progress Notes (Signed)
1053 Ephedrine 10 mg given IV due to low BP, MD updated.

## 2022-06-27 NOTE — Patient Instructions (Signed)
Resume previous diet and medications. Awaiting pathology results. Restart Plavix tomorrow at prior dosage.  YOU HAD AN ENDOSCOPIC PROCEDURE TODAY AT [facility]:   Refer to the procedure report that was given to you for any specific questions about what was found during the examination.  If the procedure report does not answer your questions, please call your gastroenterologist to clarify.  If you requested that your care partner not be given the details of your procedure findings, then the procedure report has been included in a sealed envelope for you to review at your convenience later.  YOU SHOULD EXPECT: Some feelings of bloating in the abdomen. Passage of more gas than usual.  Walking can help get rid of the air that was put into your GI tract during the procedure and reduce the bloating. If you had a lower endoscopy (such as a colonoscopy or flexible sigmoidoscopy) you may notice spotting of blood in your stool or on the toilet paper. If you underwent a bowel prep for your procedure, you may not have a normal bowel movement for a few days.  Please Note:  You might notice some irritation and congestion in your nose or some drainage.  This is from the oxygen used during your procedure.  There is no need for concern and it should clear up in a day or so.  SYMPTOMS TO REPORT IMMEDIATELY:  Following lower endoscopy (colonoscopy or flexible sigmoidoscopy):  Excessive amounts of blood in the stool  Significant tenderness or worsening of abdominal pains  Swelling of the abdomen that is new, acute  Fever of 100F or higher  For urgent or emergent issues, a gastroenterologist can be reached at any hour by calling [phone]. Do not use MyChart messaging for urgent concerns.    DIET:  We do recommend a small meal at first, but then you may proceed to your regular diet.  Drink plenty of fluids but you should avoid alcoholic beverages for 24 hours.  ACTIVITY:  You should plan to  take it easy for the rest of today and you should NOT DRIVE or use heavy machinery until tomorrow (because of the sedation medicines used during the test).    FOLLOW UP: Our staff will call the number listed on your records the next business day following your procedure.  We will call around 7:15- 8:00 am to check on you and address any questions or concerns that you may have regarding the information given to you following your procedure. If we do not reach you, we will leave a message.  If you develop any symptoms (ie: fever, flu-like symptoms, shortness of breath, cough etc.) before then, please call [phone].  If you test positive for Covid 19 in the 2 weeks post procedure, please call and report this information to [place].    If any biopsies were taken you will be contacted by phone or by letter within the next 1-3 weeks.  Please call us at [phone] if you have not heard about the biopsies in 3 weeks.    SIGNATURES/CONFIDENTIALITY: You and/or your care partner have signed paperwork which will be entered into your electronic medical record.  These signatures attest to the fact that that the information above on your After Visit Summary has been reviewed and is understood.  Full responsibility of the confidentiality of this discharge information lies with you and/or your care-partner.

## 2022-06-27 NOTE — Progress Notes (Signed)
Report given to PACU, vss 

## 2022-06-27 NOTE — Op Note (Addendum)
[place] Patient Name: [name] Procedure Date: 06/27/2022 10:40 AM MRN: [id number] Endoscopist: Mallie Mussel L. Loletha Carrow , MD Age: 71 Referring MD:  Date of Birth: Oct 01, 1951 Gender: Female Account #: [id number] Procedure:                Colonoscopy Indications:              Screening for colorectal malignant neoplasm                           No polyps last colonoscopy in 2013 Medicines:                Monitored Anesthesia Care Procedure:                Pre-Anesthesia Assessment:                           - Prior to the procedure, a History and Physical                            was performed, and patient medications and                            allergies were reviewed. The patient's tolerance of                            previous anesthesia was also reviewed. The risks                            and benefits of the procedure and the sedation                            options and risks were discussed with the patient.                            All questions were answered, and informed consent                            was obtained. Prior Anticoagulants: The patient has                            taken Plavix (clopidogrel), last dose was 5 days                            prior to procedure. ASA Grade Assessment: III - A                            patient with severe systemic disease. After                            reviewing the risks and benefits, the patient was                            deemed in satisfactory condition to undergo the  procedure.                           After obtaining informed consent, the colonoscope                            was passed under direct vision. Throughout the                            procedure, the patient's blood pressure, pulse, and                            oxygen saturations were monitored continuously. The                            CF [id number] #[id number] was introduced through the anus                             and advanced to the the cecum, identified by                            appendiceal orifice and ileocecal valve. The                            colonoscopy was performed without difficulty. The                            patient tolerated the procedure well. The quality                            of the bowel preparation was good. The ileocecal                            valve, appendiceal orifice, and rectum were                            photographed. Scope In: 10:44:55 AM Scope Out: 10:59:34 AM Scope Withdrawal Time: 0 hours 10 minutes 16 seconds  Total Procedure Duration: 0 hours 14 minutes 39 seconds  Findings:                 The perianal and digital rectal examinations were                            normal.                           Three sessile polyps were found in the transverse                            colon and ascending colon. The polyps were                            diminutive in size. These polyps were removed with  a cold snare. Resection and retrieval were complete.                           Repeat examination of right colon under NBI                            performed.                           Diverticula were found in the entire colon.                           The exam was otherwise without abnormality on                            direct and retroflexion views. Complications:            No immediate complications. Estimated Blood Loss:     Estimated blood loss was minimal. Impression:               - Three diminutive polyps in the transverse colon                            and in the ascending colon, removed with a cold                            snare. Resected and retrieved.                           - Diverticulosis in the entire examined colon.                           - The examination was otherwise normal on direct                            and retroflexion views. Recommendation:           - Patient has a  contact number available for                            emergencies. The signs and symptoms of potential                            delayed complications were discussed with the                            patient. Return to normal activities tomorrow.                            Written discharge instructions were provided to the                            patient.                           - Resume previous diet.                           -  Resume Plavix (clopidogrel) at prior dose                            tomorrow.                           - Await pathology results.                           - Repeat colonoscopy is recommended for                            surveillance. The colonoscopy date will be                            determined after pathology results from today's                            exam become available for review. Akanksha Bellmore L. Loletha Carrow, MD 06/27/2022 11:03:36 AM This report has been signed electronically.

## 2022-06-27 NOTE — Progress Notes (Signed)
Called to room to assist during endoscopic procedure.  Patient ID and intended procedure confirmed with present staff. Received instructions for my participation in the procedure from the performing physician.  

## 2022-06-27 NOTE — Progress Notes (Signed)
History and Physical:  This patient presents for endoscopic testing for: Encounter Diagnosis  Name Primary?   Special screening for malignant neoplasms, colon Yes    Details of this patient's clinical history are in my office note dated 03/24/2022.  She is here for screening colonoscopy today, having had her last such exam in 2013.  Patient is on Plavix for prior TIA, and that medicine has been held for the last 5 days.  No clinical changes since her last visit with me.  Patient is otherwise without complaints or active issues today.   Past Medical History: Past Medical History:  Diagnosis Date   Allergy    Arthritis    Breast CA ([place])    twice first on L breast 1982 w/ chest wall involvment, then a second breast ca on the R in 2376   Chronic systolic CHF (congestive heart failure) (HCC)    a. due to Adriamycin,s/p ICD;  b. 01/2013 Gen change and new LV lead - BSX Energen CRT-D BiV ICD, Ser # 283151   Depression    Diverticulitis    Early menopause    early 30s   Fatigue 02/20/2016   Glaucoma suspect of both eyes    Dr. Marshall Cork   Heart murmur    Hyperlipidemia    Nonischemic cardiomyopathy ([place])    OSA (obstructive sleep apnea) 02/07/2016   Osteopenia    Renal calculus    Snoring 02/20/2016   TIA (transient ischemic attack)      Past Surgical History: Past Surgical History:  Procedure Laterality Date   BI-VENTRICULAR IMPLANTABLE CARDIOVERTER DEFIBRILLATOR UPGRADE N/A 02/04/2013   Procedure: BI-VENTRICULAR IMPLANTABLE CARDIOVERTER DEFIBRILLATOR UPGRADE;  Surgeon: Evans Lance, MD;  Location: [facility] CATH LAB;  Service: Cardiovascular;  Laterality: N/A;   CARDIAC DEFIBRILLATOR PLACEMENT     AICD Replaced-5/09 and 2014   LEFT AND RIGHT HEART CATHETERIZATION WITH CORONARY ANGIOGRAM N/A 10/06/2013   Procedure: LEFT AND RIGHT HEART CATHETERIZATION WITH CORONARY ANGIOGRAM;  Surgeon: Jolaine Artist, MD;  Location: [facility] CATH LAB;  Service: Cardiovascular;  Laterality:  N/A;   LITHOTRIPSY     h/o several procedures    MASTECTOMY Bilateral    OOPHORECTOMY Bilateral    triger finger, R ring finger Right 02/2019   VENOGRAM N/A 10/20/2012   Procedure: VENOGRAM;  Surgeon: Deboraha Sprang, MD;  Location: [facility] CATH LAB;  Service: Cardiovascular;  Laterality: N/A;    Allergies: Allergies  Allergen Reactions   Atacand [Candesartan] Other (See Comments)    Causes fatigue    Keflex [Cephalexin] Hives and Itching   Latex Rash    Outpatient Meds: Current Outpatient Medications  Medication Sig Dispense Refill   atorvastatin (LIPITOR) 40 MG tablet TAKE ONE TABLET BY MOUTH ONE TIME DAILY 90 tablet 1   carvedilol (COREG) 12.5 MG tablet Take 1 tablet by mouth in the morning and take 1 tablet at night 180 tablet 3   empagliflozin (JARDIANCE) 10 MG TABS tablet Take 1 tablet (10 mg total) by mouth daily before breakfast. 30 tablet 11   furosemide (LASIX) 40 MG tablet Take 1 tablet (40 mg total) by mouth daily. 90 tablet 3   gabapentin (NEURONTIN) 300 MG capsule Take 300 mg by mouth daily as needed (for shingles flares).      potassium chloride (KLOR-CON) 10 MEQ tablet TAKE 3 TABLETS BY MOUTH ONCE A DAY 270 tablet 3   Probiotic Product (PROBIOTIC DAILY PO) Take 1 capsule by mouth daily.      sacubitril-valsartan (ENTRESTO)  49-51 MG Take 1 tablet by mouth 2 (two) times daily. 180 tablet 3   sertraline (ZOLOFT) 25 MG tablet TAKE ONE TABLET BY MOUTH ONE TIME DAILY 90 tablet 1   spironolactone (ALDACTONE) 25 MG tablet TAKE ONE TABLET BY MOUTH ONE TIME DAILY 90 tablet 3   zolpidem (AMBIEN) 10 MG tablet TAKE ONE-HALF TO ONE TABLET BY MOUTH AT BEDTIME AS NEEDED FOR SLEEP 90 tablet 1   amoxicillin (AMOXIL) 500 MG capsule Take 4 capsules 30 minutes before dental proedure. 4 capsule 0   ascorbic acid (VITAMIN C) 1000 MG tablet Take 1,000 mg by mouth daily.     Biotin 5000 MCG CAPS Take 5,000 mcg by mouth daily.      clopidogrel (PLAVIX) 75 MG tablet TAKE ONE TABLET BY MOUTH ONE  TIME DAILY 90 tablet 3   multivitamin (THERAGRAN) per tablet Take 1 tablet by mouth daily.     pantoprazole (PROTONIX) 40 MG tablet TAKE ONE TABLET BY MOUTH ONE TIME DAILY 90 tablet 1   tiZANidine (ZANAFLEX) 2 MG tablet Take 1 tablet (2 mg total) by mouth every 8 (eight) hours as needed for muscle spasms. 21 tablet 0   vitamin B-12 (CYANOCOBALAMIN) 1000 MCG tablet Take 1,000 mcg by mouth daily.     Current Facility-Administered Medications  Medication Dose Route Frequency Provider Last Rate Last Admin   0.9 %  sodium chloride infusion  500 mL Intravenous Once Danis, Estill Cotta III, MD          ___________________________________________________________________ Objective   Exam:  BP (!) 117/53   Pulse 64   Temp (!) 96.4 F (35.8 C)   Ht 5' 3.5" (1.613 m)   Wt 184 lb (83.5 kg)   SpO2 96%   BMI 32.08 kg/m   CV: RRR without murmur, S1/S2 Resp: clear to auscultation bilaterally, normal RR and effort noted GI: soft, no tenderness, with active bowel sounds.   Assessment: Encounter Diagnosis  Name Primary?   Special screening for malignant neoplasms, colon Yes     Plan: Colonoscopy  The benefits and risks of the planned procedure were described in detail with the patient or (when appropriate) their health care proxy.  Risks were outlined as including, but not limited to, bleeding, infection, perforation, adverse medication reaction leading to cardiac or pulmonary decompensation, pancreatitis (if ERCP).  The limitation of incomplete mucosal visualization was also discussed.  No guarantees or warranties were given.    The patient is appropriate for an endoscopic procedure in the ambulatory setting.   - Wilfrid Lund, MD

## 2022-06-30 ENCOUNTER — Telehealth: Payer: Self-pay

## 2022-06-30 NOTE — Telephone Encounter (Signed)
  Follow up Call-     06/27/2022    9:53 AM  Call back number  Post procedure Call Back phone  # [phone]  Permission to leave phone message Yes     Patient questions:  Do you have a fever, pain , or abdominal swelling? No. Pain Score  0 *  Have you tolerated food without any problems? Yes.    Have you been able to return to your normal activities? Yes.    Do you have any questions about your discharge instructions: Diet   No. Medications  No. Follow up visit  No.  Do you have questions or concerns about your Care? No.  Actions: * If pain score is 4 or above: No action needed, pain <4.

## 2022-07-01 ENCOUNTER — Encounter: Payer: Self-pay | Admitting: Gastroenterology

## 2022-07-14 NOTE — Progress Notes (Signed)
Electrophysiology Office Note Date: 07/21/2022  ID:  Alejandra, Kirby Nov 27, 1950, MRN [id number]  PCP: Colon Branch, MD Primary Cardiologist: None Electrophysiologist: [name], MD   CC: Routine ICD follow-up  [name] is a 71 y.o. female seen today for [name], MD for routine electrophysiology followup.  Since last being seen in our clinic the patient reports doing well overall.  she denies chest pain, palpitations, dyspnea, PND, orthopnea, nausea, vomiting, dizziness, syncope, edema, weight gain, or early satiety. He has not had ICD shocks.   Device History: Chemical engineer BiV ICD implanted 2005 as DDD, Upgrade 2014 for CHF/NICM   Past Medical History:  Diagnosis Date   Allergy    Arthritis    Breast CA ([place])    twice first on L breast 1982 w/ chest wall involvment, then a second breast ca on the R in 4481   Chronic systolic CHF (congestive heart failure) ([place])    a. due to Adriamycin,s/p ICD;  b. 01/2013 Gen change and new LV lead - BSX Energen CRT-D BiV ICD, Ser # [id number]   Depression    Diverticulitis    Early menopause    early 30s   Fatigue 02/20/2016   Glaucoma suspect of both eyes    Dr. Marshall Cork   Heart murmur    Hyperlipidemia    Nonischemic cardiomyopathy ([place])    OSA (obstructive sleep apnea) 02/07/2016   Osteopenia    Renal calculus    Snoring 02/20/2016   TIA (transient ischemic attack)    Past Surgical History:  Procedure Laterality Date   BI-VENTRICULAR IMPLANTABLE CARDIOVERTER DEFIBRILLATOR UPGRADE N/A 02/04/2013   Procedure: BI-VENTRICULAR IMPLANTABLE CARDIOVERTER DEFIBRILLATOR UPGRADE;  Surgeon: Evans Lance, MD;  Location: [facility] CATH LAB;  Service: Cardiovascular;  Laterality: N/A;   CARDIAC DEFIBRILLATOR PLACEMENT     AICD Replaced-5/09 and 2014   LEFT AND RIGHT HEART CATHETERIZATION WITH CORONARY ANGIOGRAM N/A 10/06/2013   Procedure: LEFT AND RIGHT HEART CATHETERIZATION WITH CORONARY ANGIOGRAM;  Surgeon:  Jolaine Artist, MD;  Location: [facility] CATH LAB;  Service: Cardiovascular;  Laterality: N/A;   LITHOTRIPSY     h/o several procedures    MASTECTOMY Bilateral    OOPHORECTOMY Bilateral    triger finger, R ring finger Right 02/2019   VENOGRAM N/A 10/20/2012   Procedure: VENOGRAM;  Surgeon: Deboraha Sprang, MD;  Location: [facility] CATH LAB;  Service: Cardiovascular;  Laterality: N/A;    Current Outpatient Medications  Medication Sig Dispense Refill   amoxicillin (AMOXIL) 500 MG capsule Take 4 capsules 30 minutes before dental proedure. 4 capsule 0   ascorbic acid (VITAMIN C) 1000 MG tablet Take 1,000 mg by mouth daily.     atorvastatin (LIPITOR) 40 MG tablet TAKE ONE TABLET BY MOUTH ONE TIME DAILY 90 tablet 1   Biotin 5000 MCG CAPS Take 5,000 mcg by mouth daily.      carvedilol (COREG) 12.5 MG tablet Take 1 tablet by mouth in the morning and take 1 tablet at night 180 tablet 3   clopidogrel (PLAVIX) 75 MG tablet TAKE ONE TABLET BY MOUTH ONE TIME DAILY 90 tablet 3   furosemide (LASIX) 40 MG tablet Take 1 tablet (40 mg total) by mouth daily. 90 tablet 3   gabapentin (NEURONTIN) 300 MG capsule Take 300 mg by mouth daily as needed (for shingles flares).      multivitamin (THERAGRAN) per tablet Take 1 tablet by mouth daily.     pantoprazole (PROTONIX) 40 MG tablet  TAKE ONE TABLET BY MOUTH ONE TIME DAILY 90 tablet 1   potassium chloride (KLOR-CON) 10 MEQ tablet TAKE 3 TABLETS BY MOUTH ONCE A DAY 270 tablet 3   Probiotic Product (PROBIOTIC DAILY PO) Take 1 capsule by mouth daily.      sacubitril-valsartan (ENTRESTO) 49-51 MG Take 1 tablet by mouth 2 (two) times daily. 180 tablet 3   sertraline (ZOLOFT) 25 MG tablet TAKE ONE TABLET BY MOUTH ONE TIME DAILY 90 tablet 1   spironolactone (ALDACTONE) 25 MG tablet TAKE ONE TABLET BY MOUTH ONE TIME DAILY 90 tablet 3   tiZANidine (ZANAFLEX) 2 MG tablet Take 1 tablet (2 mg total) by mouth every 8 (eight) hours as needed for muscle spasms. 21 tablet 0   vitamin B-12  (CYANOCOBALAMIN) 1000 MCG tablet Take 1,000 mcg by mouth daily.     zolpidem (AMBIEN) 10 MG tablet TAKE ONE-HALF TO ONE TABLET BY MOUTH AT BEDTIME AS NEEDED FOR SLEEP 90 tablet 1   No current facility-administered medications for this visit.    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  Tobacco Use   Smoking status: Never   Smokeless tobacco: Never  Vaping Use   Vaping Use: Never used  Substance and Sexual Activity   Alcohol use: Yes    Comment: socially/occassionally   Drug use: No   Sexual activity: Not on file  Other Topics Concern   Not on file  Social History Narrative   Related to Mr and Mrs Huston Foley, they are my patients as well    Married, 3 children all in [place], 7 Gkids     Siblings, 2 deceased, 2 living brothers    Social Determinants of Health   Financial Resource Strain: Low Risk  (08/03/2021)   Overall Financial Resource Strain (CARDIA)    Difficulty of Paying Living Expenses: Not hard at all  Food Insecurity: No Food Insecurity (08/03/2021)   Hunger Vital Sign    Worried About Running Out of Food in the Last Year: Never true    [place] in the Last Year: Never true  Transportation Needs: No Transportation Needs (08/03/2021)   PRAPARE - Hydrologist (Medical): No    Lack of Transportation (Non-Medical): No  Physical Activity: Sufficiently Active (08/03/2021)   Exercise Vital Sign    Days of Exercise per Week: 5 days    Minutes of Exercise per Session: 30 min  Stress: No Stress Concern Present (08/03/2021)   [place]     Feeling of Stress : Not at all  Social Connections: [place] (08/03/2021)   Social Connection and Isolation Panel [NHANES]    Frequency of Communication with Friends and Family: More than three times a week    Frequency of Social Gatherings with Friends and Family: More than three times a week    Attends Religious Services: More than 4 times per year    Active Member of [employer] or Organizations: Yes    Attends Archivist Meetings: Not on file    Marital Status: Married  Intimate  Partner Violence: Not At Risk (08/03/2021)   Humiliation, Afraid, Rape, and Kick questionnaire    Fear of Current or Ex-Partner: No    Emotionally Abused: No    Physically Abused: No    Sexually Abused: No    Family History: Family History  Problem Relation Age of Onset   Breast cancer Mother        M and sister    Lung cancer Father    Breast cancer Sister    Kidney cancer Brother    Breast cancer Maternal Grandmother    Heart attack Neg Hx    Diabetes Neg Hx    Colon cancer Neg Hx    Esophageal cancer Neg Hx    Stomach cancer Neg Hx     Review of Systems: All other systems reviewed and are otherwise negative except as noted above.   Physical Exam: Vitals:   07/21/22 0836  BP: 104/68  Pulse: 65  SpO2: 95%  Weight: 181 lb 9.6 oz (82.4 kg)  Height: '5\' 4"'$  (1.626 m)     GEN- The patient is well appearing, alert and oriented x 3 today.   HEENT: normocephalic, atraumatic; sclera clear, conjunctiva pink; hearing intact; oropharynx clear; neck supple, no JVP Lymph- no cervical lymphadenopathy Lungs- Clear to ausculation bilaterally, normal work of breathing.  No wheezes, rales, rhonchi Heart- Regular rate and rhythm, no murmurs, rubs or gallops, PMI not laterally displaced GI- soft, non-tender, non-distended, bowel sounds present, no hepatosplenomegaly Extremities- no clubbing or cyanosis. No edema; DP/PT/radial pulses 2+ bilaterally MS- no significant deformity or  atrophy Skin- warm and dry, no rash or lesion; ICD pocket well healed Psych- euthymic mood, full affect Neuro- strength and sensation are intact  ICD interrogation- reviewed in detail today,  See PACEART report  EKG:  EKG is ordered today. Personal review of EKG ordered today shows AS VP at 65 bpm  Recent Labs: 08/20/2021: ALT 27; B Natriuretic Peptide 199.2 01/27/2022: Hemoglobin 12.7; Platelets 248.0; TSH 1.37 05/05/2022: BUN 11; Creatinine, Ser 0.80; Potassium 4.3; Sodium 138   Wt Readings from Last 3 Encounters:  07/21/22 181 lb 9.6 oz (82.4 kg)  06/27/22 184 lb (83.5 kg)  05/05/22 181 lb 12.8 oz (82.5 kg)     Other studies Reviewed: Additional studies/ records that were reviewed today include: Previous EP office notes.   Assessment and Plan:  1.  Chronic systolic dysfunction  2. NICM s/p [employer] CRT-D  euvolemic today Stable on an appropriate medical regimen Normal ICD function See Pace Art report No changes today  3. NSVT Stable.  Continue BB  Current medicines are reviewed at length with the patient today.   =  Labs/ tests ordered today include:  Orders Placed This Encounter  Procedures   EKG 12-Lead    Disposition:   Follow up with Dr. Caryl Comes in 6 months   Signed, Shirley Friar, PA-C  07/21/2022 10:20 AM  [facility] [address] [place] [address] [phone] (office) [phone] (fax)

## 2022-07-21 ENCOUNTER — Ambulatory Visit: Payer: Medicare Other | Attending: Student | Admitting: Student

## 2022-07-21 ENCOUNTER — Encounter: Payer: Self-pay | Admitting: Student

## 2022-07-21 VITALS — BP 104/68 | HR 65 | Ht 64.0 in | Wt 181.6 lb

## 2022-07-21 DIAGNOSIS — I428 Other cardiomyopathies: Secondary | ICD-10-CM

## 2022-07-21 DIAGNOSIS — I4729 Other ventricular tachycardia: Secondary | ICD-10-CM | POA: Diagnosis not present

## 2022-07-21 DIAGNOSIS — I5022 Chronic systolic (congestive) heart failure: Secondary | ICD-10-CM

## 2022-07-21 LAB — CUP PACEART INCLINIC DEVICE CHECK
Date Time Interrogation Session: 20230828102053
HighPow Impedance: 32 Ohm
HighPow Impedance: 58 Ohm
Implantable Lead Implant Date: 20050701
Implantable Lead Implant Date: 20050701
Implantable Lead Implant Date: 20140314
Implantable Lead Location: 753858
Implantable Lead Location: 753859
Implantable Lead Location: 753860
Implantable Lead Model: 158
Implantable Lead Model: 4196
Implantable Lead Model: 5076
Implantable Lead Serial Number: 156416
Implantable Pulse Generator Implant Date: 20140314
Lead Channel Impedance Value: 483 Ohm
Lead Channel Impedance Value: 501 Ohm
Lead Channel Impedance Value: 622 Ohm
Lead Channel Pacing Threshold Amplitude: 0.6 V
Lead Channel Pacing Threshold Amplitude: 1 V
Lead Channel Pacing Threshold Amplitude: 1 V
Lead Channel Pacing Threshold Pulse Width: 0.4 ms
Lead Channel Pacing Threshold Pulse Width: 0.4 ms
Lead Channel Pacing Threshold Pulse Width: 0.6 ms
Lead Channel Sensing Intrinsic Amplitude: 11.7 mV
Lead Channel Sensing Intrinsic Amplitude: 16.7 mV
Lead Channel Sensing Intrinsic Amplitude: 3 mV
Lead Channel Setting Pacing Amplitude: 2 V
Lead Channel Setting Pacing Amplitude: 2 V
Lead Channel Setting Pacing Amplitude: 2 V
Lead Channel Setting Pacing Pulse Width: 0.4 ms
Lead Channel Setting Pacing Pulse Width: 0.6 ms
Lead Channel Setting Sensing Sensitivity: 0.5 mV
Lead Channel Setting Sensing Sensitivity: 1 mV
Pulse Gen Serial Number: 111235

## 2022-07-21 NOTE — Patient Instructions (Signed)
Medication Instructions:  Your physician recommends that you continue on your current medications as directed. Please refer to the Current Medication list given to you today.  *If you need a refill on your cardiac medications before your next appointment, please call your pharmacy*   Lab Work: None If you have labs (blood work) drawn today and your tests are completely normal, you will receive your results only by: [place] (if you have MyChart) OR A paper copy in the mail If you have any lab test that is abnormal or we need to change your treatment, we will call you to review the results.   Follow-Up: At [facility], you and your health needs are our priority.  As part of our continuing mission to provide you with exceptional heart care, we have created designated Provider Care Teams.  These Care Teams include your primary Cardiologist (physician) and Advanced Practice Providers (APPs -  Physician Assistants and Nurse Practitioners) who all work together to provide you with the care you need, when you need it.   Your next appointment:   6 month(s)  The format for your next appointment:   In Person  Provider:   [name], MD

## 2022-08-04 ENCOUNTER — Ambulatory Visit (INDEPENDENT_AMBULATORY_CARE_PROVIDER_SITE_OTHER): Payer: Medicare Other | Admitting: Internal Medicine

## 2022-08-04 ENCOUNTER — Encounter: Payer: Self-pay | Admitting: Internal Medicine

## 2022-08-04 VITALS — BP 118/64 | HR 66 | Temp 98.4°F | Resp 16 | Ht 64.0 in | Wt 183.2 lb

## 2022-08-04 DIAGNOSIS — R739 Hyperglycemia, unspecified: Secondary | ICD-10-CM | POA: Diagnosis not present

## 2022-08-04 DIAGNOSIS — E785 Hyperlipidemia, unspecified: Secondary | ICD-10-CM | POA: Diagnosis not present

## 2022-08-04 DIAGNOSIS — J302 Other seasonal allergic rhinitis: Secondary | ICD-10-CM

## 2022-08-04 DIAGNOSIS — I428 Other cardiomyopathies: Secondary | ICD-10-CM

## 2022-08-04 LAB — LIPID PANEL
Cholesterol: 103 mg/dL (ref 0–200)
HDL: 51.4 mg/dL (ref >39.00)
LDL Cholesterol: 37 mg/dL (ref 0–99)
NonHDL: 51.81
Total CHOL/HDL Ratio: 2
Triglycerides: 72 mg/dL (ref 0.0–149.0)
VLDL: 14.4 mg/dL (ref 0.0–40.0)

## 2022-08-04 LAB — COMPREHENSIVE METABOLIC PANEL
ALT: 30 U/L (ref 0–35)
AST: 25 U/L (ref 0–37)
Albumin: 3.8 g/dL (ref 3.5–5.2)
Alkaline Phosphatase: 93 U/L (ref 39–117)
BUN: 18 mg/dL (ref 6–23)
CO2: 27 mEq/L (ref 19–32)
Calcium: 9.7 mg/dL (ref 8.4–10.5)
Chloride: 103 mEq/L (ref 96–112)
Creatinine, Ser: 0.93 mg/dL (ref 0.40–1.20)
GFR: 61.13 mL/min (ref >60.00)
Glucose, Bld: 93 mg/dL (ref 70–99)
Potassium: 4.4 mEq/L (ref 3.5–5.1)
Sodium: 137 mEq/L (ref 135–145)
Total Bilirubin: 0.4 mg/dL (ref 0.2–1.2)
Total Protein: 6.5 g/dL (ref 6.0–8.3)

## 2022-08-04 LAB — HEMOGLOBIN A1C: Hgb A1c MFr Bld: 6.2 % (ref 4.6–6.5)

## 2022-08-04 MED ORDER — TETANUS-DIPHTH-ACELL PERTUSSIS 5-2.5-18.5 LF-MCG/0.5 IM SUSP: 0.5000 mL | Freq: Once | INTRAMUSCULAR | 0 refills | Status: AC

## 2022-08-04 MED ORDER — FEXOFENADINE HCL 60 MG PO TABS: 60.0000 mg | ORAL_TABLET | Freq: Two times a day (BID) | ORAL | Status: AC

## 2022-08-04 MED ORDER — GABAPENTIN 300 MG PO CAPS: 300.0000 mg | ORAL_CAPSULE | Freq: Every day | ORAL | 1 refills | Status: AC | PRN

## 2022-08-04 NOTE — Progress Notes (Signed)
Subjective:    Patient ID: [name], female    DOB: Jul 03, 1951, 71 y.o.   MRN: [id number]  DOS:  08/04/2022 Type of visit - description: Follow-up  In general feeling well Chronic medical problems were assessed.  Notes from cardiology reviewed Reports allergies for several months, mostly eye related: Itchy eyes, dry eyes, occasional tearing. Using Systane OTC without much relief. No visual disturbances.   Review of Systems Denies chest pain, difficulty breathing, lower extremity edema  Past Medical History:  Diagnosis Date   Allergy    Arthritis    Breast CA ([place])    twice first on L breast 1982 w/ chest wall involvment, then a second breast ca on the R in 2671   Chronic systolic CHF (congestive heart failure) (HCC)    a. due to Adriamycin,s/p ICD;  b. 01/2013 Gen change and new LV lead - BSX Energen CRT-D BiV ICD, Ser # [id number]   Depression    Diverticulitis    Early menopause    early 30s   Fatigue 02/20/2016   Glaucoma suspect of both eyes    Dr. Marshall Cork   Heart murmur    Hyperlipidemia    Nonischemic cardiomyopathy ([place])    OSA (obstructive sleep apnea) 02/07/2016   Osteopenia    Renal calculus    Snoring 02/20/2016   TIA (transient ischemic attack)     Past Surgical History:  Procedure Laterality Date   BI-VENTRICULAR IMPLANTABLE CARDIOVERTER DEFIBRILLATOR UPGRADE N/A 02/04/2013   Procedure: BI-VENTRICULAR IMPLANTABLE CARDIOVERTER DEFIBRILLATOR UPGRADE;  Surgeon: Evans Lance, MD;  Location: [facility] CATH LAB;  Service: Cardiovascular;  Laterality: N/A;   CARDIAC DEFIBRILLATOR PLACEMENT     AICD Replaced-5/09 and 2014   LEFT AND RIGHT HEART CATHETERIZATION WITH CORONARY ANGIOGRAM N/A 10/06/2013   Procedure: LEFT AND RIGHT HEART CATHETERIZATION WITH CORONARY ANGIOGRAM;  Surgeon: Jolaine Artist, MD;  Location: [facility] CATH LAB;  Service: Cardiovascular;  Laterality: N/A;   LITHOTRIPSY     h/o several procedures    MASTECTOMY Bilateral     OOPHORECTOMY Bilateral    triger finger, R ring finger Right 02/2019   VENOGRAM N/A 10/20/2012   Procedure: VENOGRAM;  Surgeon: Deboraha Sprang, MD;  Location: [facility] CATH LAB;  Service: Cardiovascular;  Laterality: N/A;    Current Outpatient Medications  Medication Instructions   amoxicillin (AMOXIL) 500 MG capsule Take 4 capsules 30 minutes before dental proedure.   ascorbic acid (VITAMIN C) 1,000 mg, Daily   atorvastatin (LIPITOR) 40 MG tablet TAKE ONE TABLET BY MOUTH ONE TIME DAILY   Biotin 5,000 mcg, Oral, Daily   carvedilol (COREG) 12.5 MG tablet Take 1 tablet by mouth in the morning and take 1 tablet at night   clopidogrel (PLAVIX) 75 MG tablet TAKE ONE TABLET BY MOUTH ONE TIME DAILY   cyanocobalamin (VITAMIN B12) 1,000 mcg, Oral, Daily   fexofenadine (ALLEGRA ALLERGY) 60 mg, Oral, 2 times daily   furosemide (LASIX) 40 mg, Oral, Daily   gabapentin (NEURONTIN) 300 mg, Oral, Daily PRN   multivitamin (THERAGRAN) per tablet 1 tablet, Oral, Daily   pantoprazole (PROTONIX) 40 MG tablet TAKE ONE TABLET BY MOUTH ONE TIME DAILY   potassium chloride (KLOR-CON) 10 MEQ tablet TAKE 3 TABLETS BY MOUTH ONCE A DAY   Probiotic Product (PROBIOTIC DAILY PO) 1 capsule, Oral, Daily   sacubitril-valsartan (ENTRESTO) 49-51 MG 1 tablet, Oral, 2 times daily   sertraline (ZOLOFT) 25 MG tablet TAKE ONE TABLET BY MOUTH ONE TIME DAILY  spironolactone (ALDACTONE) 25 MG tablet TAKE ONE TABLET BY MOUTH ONE TIME DAILY   Tdap (BOOSTRIX) 5-2.5-18.5 LF-MCG/0.5 injection 0.5 mLs, Intramuscular,  Once   tiZANidine (ZANAFLEX) 2 mg, Oral, Every 8 hours PRN   zolpidem (AMBIEN) 10 MG tablet TAKE ONE-HALF TO ONE TABLET BY MOUTH AT BEDTIME AS NEEDED FOR SLEEP       Objective:   Physical Exam BP 118/64   Pulse 66   Temp 98.4 F (36.9 C) (Oral)   Resp 16   Ht '5\' 4"'$  (1.626 m)   Wt 183 lb 4 oz (83.1 kg)   SpO2 97%   BMI 31.45 kg/m  General:   Well developed, NAD, BMI noted. HEENT:  Normocephalic . Face symmetric,  atraumatic Lungs:  CTA B Normal respiratory effort, no intercostal retractions, no accessory muscle use. Heart: RRR, + murmur Lower extremities: no pretibial edema bilaterally  Skin: Not pale. Not jaundice Neurologic:  alert & oriented X3.  Speech normal, gait appropriate for age and unassisted Psych--  Cognition and judgment appear intact.  Cooperative with normal attention span and concentration.  Behavior appropriate. No anxious or depressed appearing.      Assessment     Assessment Hyperglycemia A1c 5.9 (04/2017) High cholesterol CV:Dr. Caryl Comes and [facility] --CHF, nonischemic cardiomyopathy:due to chemotherapy --MR severe --VT -TIA 05-2017 Depression, insomnia Mild OSA per sleep study 11-2015, saw Dr Radford Pax 02-20-16: no need for CPAP Osteopenia-- DEXA 06/13/2015 normal Vitamin D deficiency Kidney stones Menopause Breast cancer x 2: first in 1982 with chest wall involvement, 2nd on the R dx  1987, s/p B mastectomy; + BRACA Handicap sticker signed 06-2016 Post herpetic neuralgia (shingles 2014) , R face-- gaba prn  PLAN: Hyperglycemia: Healthy diet encouraged, check A1c CHF, MR, history of VT:  Last visit with cardiology 07/21/2022, felt to be stable. Reports that she was Rx Jardiance but could not tolerate.  On multiple meds.  Check CMP Hyperlipidemia: On atorvastatin, check FLP, further advised with results. Neuropathy:  Refill gabapentin Allergies: Mostly eye symptoms, recommend to change Zyrtec to Allegra, use OTC Patanol, Flonase.  See AVS. Preventive care: Had a colonoscopy 06-2022. Vaccines I recommend: Flu shot, Shingrix, COVID, Tdap RTC 6 months

## 2022-08-04 NOTE — Assessment & Plan Note (Signed)
Hyperglycemia: Healthy diet encouraged, check A1c CHF, MR, history of VT:  Last visit with cardiology 07/21/2022, felt to be stable. Reports that she was Rx Jardiance but could not tolerate.  On multiple meds.  Check CMP Hyperlipidemia: On atorvastatin, check FLP, further advised with results. Neuropathy:  Refill gabapentin Allergies: Mostly eye symptoms, recommend to change Zyrtec to Allegra, use OTC Patanol, Flonase.  See AVS. Preventive care: Had a colonoscopy 06-2022. Vaccines I recommend: Flu shot, Shingrix, COVID, Tdap RTC 6 months

## 2022-08-04 NOTE — Patient Instructions (Addendum)
Recommend to proceed with the following vaccines at your pharmacy:  Flu shot- high dose Shingrix (shingles) Covid booster (bivalent) Tdap (tetanus)   For allergies: You can change over-the-counter Zyrtec to over-the-counter Allegra 60 mg 1 tablet twice daily. Flonase: 2 sprays on each side of the nose daily. Patanol eyedrops: As directed Is also okay to continue using Systane See your eye doctor  Check the  blood pressure regularly BP GOAL is between 110/65 and  135/85. If it is consistently higher or lower, let me know    GO TO THE LAB : Get the blood work     [name], [place] back for a physical in 6 months

## 2022-08-05 ENCOUNTER — Ambulatory Visit: Payer: Medicare Other

## 2022-08-06 ENCOUNTER — Other Ambulatory Visit (HOSPITAL_COMMUNITY): Payer: Self-pay | Admitting: Internal Medicine

## 2022-08-15 DIAGNOSIS — H04123 Dry eye syndrome of bilateral lacrimal glands: Secondary | ICD-10-CM | POA: Diagnosis not present

## 2022-08-25 DIAGNOSIS — M65332 Trigger finger, left middle finger: Secondary | ICD-10-CM | POA: Diagnosis not present

## 2022-08-25 DIAGNOSIS — M1811 Unilateral primary osteoarthritis of first carpometacarpal joint, right hand: Secondary | ICD-10-CM | POA: Diagnosis not present

## 2022-08-26 ENCOUNTER — Encounter: Payer: Self-pay | Admitting: Internal Medicine

## 2022-08-26 DIAGNOSIS — M1711 Unilateral primary osteoarthritis, right knee: Secondary | ICD-10-CM | POA: Diagnosis not present

## 2022-08-27 ENCOUNTER — Other Ambulatory Visit: Payer: Self-pay | Admitting: Internal Medicine

## 2022-08-27 MED ORDER — MECLIZINE HCL 12.5 MG PO TABS: 12.5000 mg | ORAL_TABLET | Freq: Three times a day (TID) | ORAL | 0 refills | Status: DC | PRN

## 2022-08-27 NOTE — Progress Notes (Unsigned)
Subjective:    Patient ID: [name], female    DOB: 1951-10-05, 71 y.o.   MRN: [id number]  DOS:  08/27/2022 Type of visit - description:     Review of Systems See above   Past Medical History:  Diagnosis Date   Allergy    Arthritis    Breast CA ([place])    twice first on L breast 1982 w/ chest wall involvment, then a second breast ca on the R in 6387   Chronic systolic CHF (congestive heart failure) ([place])    a. due to Adriamycin,s/p ICD;  b. 01/2013 Gen change and new LV lead - BSX Energen CRT-D BiV ICD, Ser # [id number]   Depression    Diverticulitis    Early menopause    early 30s   Fatigue 02/20/2016   Glaucoma suspect of both eyes    Dr. Marshall Cork   Heart murmur    Hyperlipidemia    Nonischemic cardiomyopathy ([place])    OSA (obstructive sleep apnea) 02/07/2016   Osteopenia    Renal calculus    Snoring 02/20/2016   TIA (transient ischemic attack)     Past Surgical History:  Procedure Laterality Date   BI-VENTRICULAR IMPLANTABLE CARDIOVERTER DEFIBRILLATOR UPGRADE N/A 02/04/2013   Procedure: BI-VENTRICULAR IMPLANTABLE CARDIOVERTER DEFIBRILLATOR UPGRADE;  Surgeon: Evans Lance, MD;  Location: [facility] CATH LAB;  Service: Cardiovascular;  Laterality: N/A;   CARDIAC DEFIBRILLATOR PLACEMENT     AICD Replaced-5/09 and 2014   LEFT AND RIGHT HEART CATHETERIZATION WITH CORONARY ANGIOGRAM N/A 10/06/2013   Procedure: LEFT AND RIGHT HEART CATHETERIZATION WITH CORONARY ANGIOGRAM;  Surgeon: Jolaine Artist, MD;  Location: [facility] CATH LAB;  Service: Cardiovascular;  Laterality: N/A;   LITHOTRIPSY     h/o several procedures    MASTECTOMY Bilateral    OOPHORECTOMY Bilateral    triger finger, R ring finger Right 02/2019   VENOGRAM N/A 10/20/2012   Procedure: VENOGRAM;  Surgeon: Deboraha Sprang, MD;  Location: [facility] CATH LAB;  Service: Cardiovascular;  Laterality: N/A;    Current Outpatient Medications  Medication Instructions   amoxicillin (AMOXIL) 500 MG capsule Take 4  capsules 30 minutes before dental proedure.   ascorbic acid (VITAMIN C) 1,000 mg, Daily   atorvastatin (LIPITOR) 40 MG tablet TAKE ONE TABLET BY MOUTH ONE TIME DAILY   Biotin 5,000 mcg, Oral, Daily   carvedilol (COREG) 12.5 MG tablet Take 1 tablet by mouth in the morning and take 1 tablet at night   clopidogrel (PLAVIX) 75 MG tablet TAKE ONE TABLET BY MOUTH ONE TIME DAILY   cyanocobalamin (VITAMIN B12) 1,000 mcg, Oral, Daily   fexofenadine (ALLEGRA ALLERGY) 60 mg, Oral, 2 times daily   furosemide (LASIX) 40 mg, Oral, Daily   gabapentin (NEURONTIN) 300 mg, Oral, Daily PRN   multivitamin (THERAGRAN) per tablet 1 tablet, Oral, Daily   pantoprazole (PROTONIX) 40 MG tablet TAKE ONE TABLET BY MOUTH ONE TIME DAILY   potassium chloride (KLOR-CON M) 10 MEQ tablet TAKE 3 TABLETS BY MOUTH ONCE A DAY   Probiotic Product (PROBIOTIC DAILY PO) 1 capsule, Oral, Daily   sacubitril-valsartan (ENTRESTO) 49-51 MG 1 tablet, Oral, 2 times daily   sertraline (ZOLOFT) 25 MG tablet TAKE ONE TABLET BY MOUTH ONE TIME DAILY   spironolactone (ALDACTONE) 25 MG tablet TAKE ONE TABLET BY MOUTH ONE TIME DAILY   tiZANidine (ZANAFLEX) 2 mg, Oral, Every 8 hours PRN   zolpidem (AMBIEN) 10 MG tablet TAKE ONE-HALF TO ONE TABLET BY MOUTH AT BEDTIME AS  NEEDED FOR SLEEP       Objective:   Physical Exam There were no vitals taken for this visit.     Assessment

## 2022-09-01 ENCOUNTER — Other Ambulatory Visit (HOSPITAL_COMMUNITY): Payer: Self-pay | Admitting: Internal Medicine

## 2022-09-05 ENCOUNTER — Ambulatory Visit (INDEPENDENT_AMBULATORY_CARE_PROVIDER_SITE_OTHER): Payer: Medicare Other

## 2022-09-05 DIAGNOSIS — I428 Other cardiomyopathies: Secondary | ICD-10-CM

## 2022-09-08 LAB — CUP PACEART REMOTE DEVICE CHECK
Battery Remaining Longevity: 8 mo
Battery Remaining Longevity: 8 mo
Battery Remaining Percentage: 12 %
Battery Remaining Percentage: 12 %
Brady Statistic RA Percent Paced: 0 %
Brady Statistic RA Percent Paced: 0 %
Brady Statistic RV Percent Paced: 100 %
Brady Statistic RV Percent Paced: 100 %
Date Time Interrogation Session: 20231013001100
Date Time Interrogation Session: 20231013212800
HighPow Impedance: 56 Ohm
HighPow Impedance: 58 Ohm
Implantable Lead Implant Date: 20050701
Implantable Lead Implant Date: 20050701
Implantable Lead Implant Date: 20050701
Implantable Lead Implant Date: 20050701
Implantable Lead Implant Date: 20140314
Implantable Lead Implant Date: 20140314
Implantable Lead Location: 753858
Implantable Lead Location: 753858
Implantable Lead Location: 753859
Implantable Lead Location: 753859
Implantable Lead Location: 753860
Implantable Lead Location: 753860
Implantable Lead Model: 158
Implantable Lead Model: 158
Implantable Lead Model: 4196
Implantable Lead Model: 4196
Implantable Lead Model: 5076
Implantable Lead Model: 5076
Implantable Lead Serial Number: 156416
Implantable Lead Serial Number: 156416
Implantable Pulse Generator Implant Date: 20140314
Implantable Pulse Generator Implant Date: 20140314
Lead Channel Impedance Value: 442 Ohm
Lead Channel Impedance Value: 460 Ohm
Lead Channel Impedance Value: 479 Ohm
Lead Channel Impedance Value: 487 Ohm
Lead Channel Impedance Value: 570 Ohm
Lead Channel Impedance Value: 593 Ohm
Lead Channel Pacing Threshold Amplitude: 0.6 V
Lead Channel Pacing Threshold Amplitude: 0.6 V
Lead Channel Pacing Threshold Amplitude: 1 V
Lead Channel Pacing Threshold Amplitude: 1 V
Lead Channel Pacing Threshold Amplitude: 1 V
Lead Channel Pacing Threshold Amplitude: 1 V
Lead Channel Pacing Threshold Pulse Width: 0.4 ms
Lead Channel Pacing Threshold Pulse Width: 0.4 ms
Lead Channel Pacing Threshold Pulse Width: 0.4 ms
Lead Channel Pacing Threshold Pulse Width: 0.4 ms
Lead Channel Pacing Threshold Pulse Width: 0.6 ms
Lead Channel Pacing Threshold Pulse Width: 0.6 ms
Lead Channel Setting Pacing Amplitude: 2 V
Lead Channel Setting Pacing Amplitude: 2 V
Lead Channel Setting Pacing Amplitude: 2 V
Lead Channel Setting Pacing Amplitude: 2 V
Lead Channel Setting Pacing Amplitude: 2 V
Lead Channel Setting Pacing Amplitude: 2 V
Lead Channel Setting Pacing Pulse Width: 0.4 ms
Lead Channel Setting Pacing Pulse Width: 0.4 ms
Lead Channel Setting Pacing Pulse Width: 0.6 ms
Lead Channel Setting Pacing Pulse Width: 0.6 ms
Lead Channel Setting Sensing Sensitivity: 0.5 mV
Lead Channel Setting Sensing Sensitivity: 0.5 mV
Lead Channel Setting Sensing Sensitivity: 1 mV
Lead Channel Setting Sensing Sensitivity: 1 mV
Pulse Gen Serial Number: 111235
Pulse Gen Serial Number: 111235

## 2022-09-10 NOTE — Progress Notes (Signed)
Remote ICD transmission.   

## 2022-09-15 DIAGNOSIS — H04123 Dry eye syndrome of bilateral lacrimal glands: Secondary | ICD-10-CM | POA: Diagnosis not present

## 2022-09-16 ENCOUNTER — Other Ambulatory Visit (HOSPITAL_COMMUNITY): Payer: Self-pay | Admitting: *Deleted

## 2022-09-19 ENCOUNTER — Telehealth (HOSPITAL_COMMUNITY): Payer: Self-pay | Admitting: *Deleted

## 2022-09-19 NOTE — Telephone Encounter (Signed)
Pt left vm requesting return call about medications. I called pt back and received a recording that my call could not be completed. I called pt three times and received the same recording.

## 2022-09-20 ENCOUNTER — Other Ambulatory Visit: Payer: Self-pay | Admitting: Internal Medicine

## 2022-09-23 ENCOUNTER — Other Ambulatory Visit (HOSPITAL_COMMUNITY): Payer: Self-pay

## 2022-09-23 MED ORDER — ENTRESTO 49-51 MG PO TABS: 1.0000 | ORAL_TABLET | Freq: Two times a day (BID) | ORAL | 3 refills | Status: DC

## 2022-09-24 ENCOUNTER — Other Ambulatory Visit (HOSPITAL_COMMUNITY): Payer: Self-pay | Admitting: *Deleted

## 2022-09-24 MED ORDER — FUROSEMIDE 40 MG PO TABS: ORAL_TABLET | ORAL | 3 refills | Status: DC

## 2022-09-24 MED ORDER — AMOXICILLIN 500 MG PO CAPS: ORAL_CAPSULE | ORAL | 3 refills | Status: DC

## 2022-10-01 ENCOUNTER — Other Ambulatory Visit: Payer: Self-pay | Admitting: *Deleted

## 2022-10-01 DIAGNOSIS — L905 Scar conditions and fibrosis of skin: Secondary | ICD-10-CM | POA: Diagnosis not present

## 2022-10-01 DIAGNOSIS — Z85828 Personal history of other malignant neoplasm of skin: Secondary | ICD-10-CM | POA: Diagnosis not present

## 2022-10-01 DIAGNOSIS — L814 Other melanin hyperpigmentation: Secondary | ICD-10-CM | POA: Diagnosis not present

## 2022-10-01 DIAGNOSIS — L821 Other seborrheic keratosis: Secondary | ICD-10-CM | POA: Diagnosis not present

## 2022-10-01 DIAGNOSIS — L570 Actinic keratosis: Secondary | ICD-10-CM | POA: Diagnosis not present

## 2022-10-01 DIAGNOSIS — Z08 Encounter for follow-up examination after completed treatment for malignant neoplasm: Secondary | ICD-10-CM | POA: Diagnosis not present

## 2022-10-01 DIAGNOSIS — D225 Melanocytic nevi of trunk: Secondary | ICD-10-CM | POA: Diagnosis not present

## 2022-10-01 MED ORDER — FUROSEMIDE 40 MG PO TABS: ORAL_TABLET | ORAL | 3 refills | Status: DC

## 2022-10-01 NOTE — Telephone Encounter (Signed)
Pt unable to tolerate Jardiance, this was stopped and pt was advised to resume previous Furosemide dose, 80 mg in AM and 40 mg in PM, new rx sent in

## 2022-10-23 ENCOUNTER — Other Ambulatory Visit (HOSPITAL_COMMUNITY): Payer: Self-pay

## 2022-10-23 ENCOUNTER — Telehealth (HOSPITAL_COMMUNITY): Payer: Self-pay

## 2022-10-23 MED ORDER — ENTRESTO 49-51 MG PO TABS: 1.0000 | ORAL_TABLET | Freq: Two times a day (BID) | ORAL | 3 refills | Status: DC

## 2022-10-23 NOTE — Telephone Encounter (Signed)
Advanced Heart Failure Patient Advocate Encounter  The patient was approved for a [place] that will help cover the cost of Entresto.  Total amount awarded, $10,000.  Effective: 09/23/2022 - 09/23/2023.  BIN [id number] PCN [id number] Group [id number] ID [id number]  New prescription(s) sent to [facility]. Patient provided with approval and processing information via USPS.  Clista Bernhardt, CPhT Rx Patient Advocate Phone: [phone]

## 2022-10-23 NOTE — Addendum Note (Signed)
Addended by: Davena Julian, Sharlot Gowda on: 10/23/2022 02:05 PM   Modules accepted: Orders

## 2022-11-03 ENCOUNTER — Ambulatory Visit (INDEPENDENT_AMBULATORY_CARE_PROVIDER_SITE_OTHER): Payer: Medicare Other | Admitting: Family Medicine

## 2022-11-03 ENCOUNTER — Encounter: Payer: Self-pay | Admitting: Family Medicine

## 2022-11-03 VITALS — BP 108/68 | HR 81 | Temp 98.4°F | Ht 64.0 in | Wt 179.2 lb

## 2022-11-03 DIAGNOSIS — J209 Acute bronchitis, unspecified: Secondary | ICD-10-CM | POA: Diagnosis not present

## 2022-11-03 MED ORDER — AZITHROMYCIN 250 MG PO TABS: ORAL_TABLET | ORAL | 0 refills | Status: DC

## 2022-11-03 MED ORDER — HYDROCODONE BIT-HOMATROP MBR 5-1.5 MG/5ML PO SOLN: 5.0000 mL | Freq: Three times a day (TID) | ORAL | 0 refills | Status: DC | PRN

## 2022-11-03 NOTE — Progress Notes (Signed)
Chief Complaint  Patient presents with   Cough    Cough since Monday a week ago.   Ear Pain    Congestion     Lynnex C Cowie here for URI complaints.  Duration: 1 week  Associated symptoms: rhinorrhea, ear pain, and coughing Denies: sinus congestion, sinus pain, itchy watery eyes, ear drainage, wheezing, shortness of breath, myalgia, and fevers Treatment to date: Mucinex DM Sick contacts: No  Past Medical History:  Diagnosis Date   Allergy    Arthritis    Breast CA ([place])    twice first on L breast 1982 w/ chest wall involvment, then a second breast ca on the R in 5003   Chronic systolic CHF (congestive heart failure) (HCC)    a. due to Adriamycin,s/p ICD;  b. 01/2013 Gen change and new LV lead - BSX Energen CRT-D BiV ICD, Ser # [id number]   Depression    Diverticulitis    Early menopause    early 30s   Fatigue 02/20/2016   Glaucoma suspect of both eyes    Dr. Marshall Cork   Heart murmur    Hyperlipidemia    Nonischemic cardiomyopathy (HCC)    OSA (obstructive sleep apnea) 02/07/2016   Osteopenia    Renal calculus    Snoring 02/20/2016   TIA (transient ischemic attack)     Objective BP 108/68 (BP Location: Left Arm, Patient Position: Sitting, Cuff Size: Normal)   Pulse 81   Temp 98.4 F (36.9 C) (Oral)   Ht '5\' 4"'$  (1.626 m)   Wt 179 lb 4 oz (81.3 kg)   SpO2 98%   BMI 30.77 kg/m  General: Awake, alert, appears stated age HEENT: AT, [place], ears patent b/l and TM's neg, nares patent w/o discharge, pharynx pink and without exudates, MMM Neck: No masses or asymmetry Heart: RRR Lungs: CTAB, no accessory muscle use Psych: Age appropriate judgment and insight, normal mood and affect  Acute bronchitis, unspecified organism - Plan: HYDROcodone bit-homatropine (HYCODAN) 5-1.5 MG/5ML syrup  Has done well w syrups in past. Will send again. Warnings verbalized and written down. If no better in 3 d, will take Zpak. Continue to push fluids, practice good hand hygiene,  cover mouth when coughing. F/u prn. If starting to experience fevers, shaking, or shortness of breath, seek immediate care. Pt voiced understanding and agreement to the plan.  [place], DO 11/03/22 3:52 PM

## 2022-11-03 NOTE — Patient Instructions (Signed)
Continue to push fluids, practice good hand hygiene, and cover your mouth if you cough.  If you start having fevers, shaking or shortness of breath, seek immediate care.  OK to take Tylenol 1000 mg (2 extra strength tabs) or 975 mg (3 regular strength tabs) every 6 hours as needed.  Do not drink alcohol, do any illicit/street drugs, drive or do anything that requires alertness while on this medicine.   Wait a few days before taking the antibiotic (azithromycin). Only take it if not improving.  Let us know if you need anything.

## 2022-11-10 DIAGNOSIS — H353131 Nonexudative age-related macular degeneration, bilateral, early dry stage: Secondary | ICD-10-CM | POA: Diagnosis not present

## 2022-11-10 DIAGNOSIS — H25813 Combined forms of age-related cataract, bilateral: Secondary | ICD-10-CM | POA: Diagnosis not present

## 2022-11-10 DIAGNOSIS — H524 Presbyopia: Secondary | ICD-10-CM | POA: Diagnosis not present

## 2022-11-10 DIAGNOSIS — H40013 Open angle with borderline findings, low risk, bilateral: Secondary | ICD-10-CM | POA: Diagnosis not present

## 2022-11-10 DIAGNOSIS — H04123 Dry eye syndrome of bilateral lacrimal glands: Secondary | ICD-10-CM | POA: Diagnosis not present

## 2022-11-22 ENCOUNTER — Other Ambulatory Visit (HOSPITAL_COMMUNITY): Payer: Self-pay | Admitting: Internal Medicine

## 2022-11-22 ENCOUNTER — Other Ambulatory Visit: Payer: Self-pay | Admitting: Internal Medicine

## 2022-12-05 ENCOUNTER — Ambulatory Visit (INDEPENDENT_AMBULATORY_CARE_PROVIDER_SITE_OTHER): Payer: Medicare Other

## 2022-12-05 DIAGNOSIS — I428 Other cardiomyopathies: Secondary | ICD-10-CM | POA: Diagnosis not present

## 2022-12-05 LAB — CUP PACEART REMOTE DEVICE CHECK
Battery Remaining Longevity: 4 mo
Battery Remaining Percentage: 6 %
Brady Statistic RA Percent Paced: 0 %
Brady Statistic RV Percent Paced: 100 %
Date Time Interrogation Session: 20240112001100
HighPow Impedance: 54 Ohm
Implantable Lead Connection Status: 753985
Implantable Lead Connection Status: 753985
Implantable Lead Connection Status: 753985
Implantable Lead Implant Date: 20050701
Implantable Lead Implant Date: 20050701
Implantable Lead Implant Date: 20140314
Implantable Lead Location: 753858
Implantable Lead Location: 753859
Implantable Lead Location: 753860
Implantable Lead Model: 158
Implantable Lead Model: 4196
Implantable Lead Model: 5076
Implantable Lead Serial Number: 156416
Implantable Pulse Generator Implant Date: 20140314
Lead Channel Impedance Value: 439 Ohm
Lead Channel Impedance Value: 456 Ohm
Lead Channel Impedance Value: 556 Ohm
Lead Channel Pacing Threshold Amplitude: 0.6 V
Lead Channel Pacing Threshold Amplitude: 1 V
Lead Channel Pacing Threshold Amplitude: 1 V
Lead Channel Pacing Threshold Pulse Width: 0.4 ms
Lead Channel Pacing Threshold Pulse Width: 0.4 ms
Lead Channel Pacing Threshold Pulse Width: 0.6 ms
Lead Channel Setting Pacing Amplitude: 2 V
Lead Channel Setting Pacing Amplitude: 2 V
Lead Channel Setting Pacing Amplitude: 2 V
Lead Channel Setting Pacing Pulse Width: 0.4 ms
Lead Channel Setting Pacing Pulse Width: 0.6 ms
Lead Channel Setting Sensing Sensitivity: 0.5 mV
Lead Channel Setting Sensing Sensitivity: 1 mV
Pulse Gen Serial Number: 111235

## 2022-12-22 ENCOUNTER — Telehealth: Payer: Self-pay | Admitting: Internal Medicine

## 2022-12-23 ENCOUNTER — Telehealth: Payer: Self-pay

## 2022-12-23 DIAGNOSIS — M1711 Unilateral primary osteoarthritis, right knee: Secondary | ICD-10-CM | POA: Diagnosis not present

## 2022-12-23 NOTE — Telephone Encounter (Signed)
Latitude alert for ERI, triggered on 12/21/22. Normal device function. Routing for further review. - JJB   I spoke with patient and let her know that her device has reached ERI. She should expect to hear from scheduling team and Dr. Olin Pia nurse regarding upcoming appts to discuss and schedule for changeout.

## 2022-12-23 NOTE — Telephone Encounter (Signed)
PDMP okay, Rx sent 

## 2022-12-23 NOTE — Telephone Encounter (Signed)
Requesting: Ambien '10mg'$   Contract: 09/20/21 UDS: Ambien only Last Visit: 08/04/22 Next Visit: 02/09/23 Last Refill: 03/17/22 #90 and 1RF   Please Advise

## 2022-12-23 NOTE — Progress Notes (Signed)
Remote ICD transmission.   

## 2022-12-24 NOTE — Telephone Encounter (Signed)
Pt is scheduled for 01/12/2023 with Dr Caryl Comes to discuss generator change.

## 2023-01-01 DIAGNOSIS — M1811 Unilateral primary osteoarthritis of first carpometacarpal joint, right hand: Secondary | ICD-10-CM | POA: Diagnosis not present

## 2023-01-01 DIAGNOSIS — R52 Pain, unspecified: Secondary | ICD-10-CM | POA: Diagnosis not present

## 2023-01-01 DIAGNOSIS — M65332 Trigger finger, left middle finger: Secondary | ICD-10-CM | POA: Diagnosis not present

## 2023-01-05 ENCOUNTER — Ambulatory Visit: Payer: Medicare Other

## 2023-01-05 DIAGNOSIS — I428 Other cardiomyopathies: Secondary | ICD-10-CM

## 2023-01-06 LAB — CUP PACEART REMOTE DEVICE CHECK
Brady Statistic RA Percent Paced: 0 %
Brady Statistic RV Percent Paced: 100 %
Date Time Interrogation Session: 20240212001200
HighPow Impedance: 59 Ohm
Implantable Lead Connection Status: 753985
Implantable Lead Connection Status: 753985
Implantable Lead Connection Status: 753985
Implantable Lead Implant Date: 20050701
Implantable Lead Implant Date: 20050701
Implantable Lead Implant Date: 20140314
Implantable Lead Location: 753858
Implantable Lead Location: 753859
Implantable Lead Location: 753860
Implantable Lead Model: 158
Implantable Lead Model: 4196
Implantable Lead Model: 5076
Implantable Lead Serial Number: 156416
Implantable Pulse Generator Implant Date: 20140314
Lead Channel Impedance Value: 474 Ohm
Lead Channel Impedance Value: 483 Ohm
Lead Channel Impedance Value: 589 Ohm
Lead Channel Pacing Threshold Amplitude: 0.6 V
Lead Channel Pacing Threshold Amplitude: 1 V
Lead Channel Pacing Threshold Amplitude: 1 V
Lead Channel Pacing Threshold Pulse Width: 0.4 ms
Lead Channel Pacing Threshold Pulse Width: 0.4 ms
Lead Channel Pacing Threshold Pulse Width: 0.6 ms
Lead Channel Setting Pacing Amplitude: 2 V
Lead Channel Setting Pacing Amplitude: 2 V
Lead Channel Setting Pacing Amplitude: 2 V
Lead Channel Setting Pacing Pulse Width: 0.4 ms
Lead Channel Setting Pacing Pulse Width: 0.6 ms
Lead Channel Setting Sensing Sensitivity: 0.5 mV
Lead Channel Setting Sensing Sensitivity: 1 mV
Pulse Gen Serial Number: 111235

## 2023-01-12 ENCOUNTER — Ambulatory Visit: Payer: Medicare Other | Attending: Internal Medicine | Admitting: Internal Medicine

## 2023-01-12 ENCOUNTER — Encounter: Payer: Self-pay | Admitting: Internal Medicine

## 2023-01-12 VITALS — BP 110/66 | HR 75 | Ht 64.0 in | Wt 180.0 lb

## 2023-01-12 DIAGNOSIS — I4729 Other ventricular tachycardia: Secondary | ICD-10-CM

## 2023-01-12 DIAGNOSIS — Z01812 Encounter for preprocedural laboratory examination: Secondary | ICD-10-CM

## 2023-01-12 DIAGNOSIS — Z95810 Presence of automatic (implantable) cardiac defibrillator: Secondary | ICD-10-CM

## 2023-01-12 DIAGNOSIS — I5022 Chronic systolic (congestive) heart failure: Secondary | ICD-10-CM

## 2023-01-12 DIAGNOSIS — I428 Other cardiomyopathies: Secondary | ICD-10-CM | POA: Diagnosis not present

## 2023-01-12 NOTE — Patient Instructions (Signed)
Medication Instructions:  Your physician recommends that you continue on your current medications as directed. Please refer to the Current Medication list given to you today.  *If you need a refill on your cardiac medications before your next appointment, please call your pharmacy*   Lab Work: None ordered.  If you have labs (blood work) drawn today and your tests are completely normal, you will receive your results only by: [place] (if you have MyChart) OR A paper copy in the mail If you have any lab test that is abnormal or we need to change your treatment, we will call you to review the results.   Testing/Procedures: None ordered.    Follow-Up: At [facility], you and your health needs are our priority.  As part of our continuing mission to provide you with exceptional heart care, we have created designated Provider Care Teams.  These Care Teams include your primary Cardiologist (physician) and Advanced Practice Providers (APPs -  Physician Assistants and Nurse Practitioners) who all work together to provide you with the care you need, when you need it.  We recommend signing up for the patient portal called "MyChart".  Sign up information is provided on this After Visit Summary.  MyChart is used to connect with patients for Virtual Visits (Telemedicine).  Patients are able to view lab/test results, encounter notes, upcoming appointments, etc.  Non-urgent messages can be sent to your provider as well.   To learn more about what you can do with MyChart, go to [email or web address].    Your next appointment:   To be scheduled

## 2023-01-12 NOTE — H&P (View-Only) (Signed)
After watching   seen    Patient Care Team: Paz, Jose E, MD as PCP - General Leeana Creer C, MD as PCP - Electrophysiology (Cardiology) Bensimhon, Daniel R, MD as Consulting Physician (Cardiology) Swinteck, Brian, MD as Consulting Physician (Orthopedic Surgery) Hecker, Kathryn, MD as Consulting Physician (Ophthalmology)   HPI  [name] is a 71 y.o. female seen in followup for congestive heart failure in the setting of chemotherapy associated cardiomyopathy with previously implanted CRT originally as part of the MADIT CRT protocol 2005 ; her LV lead had failed and was plugged. 2014 underwent CRT upgrade.    She has a history of a TIA.  9/18 aspirin was decreased to 81 and Plavix was added.  The patient denies chest pain, nocturnal dyspnea, orthopnea.  There have been no palpitations, lightheadedness or syncope.  Complains of dyspnea and fatigue.    Her twin brother and nephew both died 2021- devastating   DATE TEST EF   2009 LHC  No angiogr CAD  2009 Echo  20%   11/14 Echo   10-15 %   7/15 Echo   10-15 % Severe RV dysfn  9/17 Echo  20-25% AI mod MR severe  6/18 Echo  20-25% MR mild AI mod  12/19 Echo  20/25% MR severe  eccentric  6/21 Echo  40-45%  MR-mild  9/22 Echo  40-45% MR mod      Date Cr K Dig  3/18  0.91 4.4 0.7  12/18 0.96 4.2 0.3 (6/18)  7/20 0.8 4.5 0.5  3/22 0.76 3.8   9/23 0.93 4.4 12.7(3/23)   Date LDL  8/17 118  5/20  60     Past Medical History:  Diagnosis Date   Allergy    Arthritis    Breast CA (H[facility])    twice first on L breast 1982 w/ chest wall involvment, then a second breast ca on the R in 1987   Chronic systolic CHF (congestive heart failure) (H[facility])    a. due to Adriamycin,s/p ICD;  b. 01/2013 Gen change and new LV lead - BSX Energen CRT-D BiV ICD, Ser # [id number]   Depression    Diverticulitis    Early menopause    early 30s   Fatigue 02/20/2016   Glaucoma suspect of both eyes    Dr. Christopher Weaver   Heart murmur     Hyperlipidemia    Nonischemic cardiomyopathy (HCC)    OSA (obstructive sleep apnea) 02/07/2016   Osteopenia    Renal calculus    Snoring 02/20/2016   TIA (transient ischemic attack)     Past Surgical History:  Procedure Laterality Date   BI-VENTRICULAR IMPLANTABLE CARDIOVERTER DEFIBRILLATOR UPGRADE N/A 02/04/2013   Procedure: BI-VENTRICULAR IMPLANTABLE CARDIOVERTER DEFIBRILLATOR UPGRADE;  Surgeon: Gregg W Taylor, MD;  Location: [facility] CATH LAB;  Service: Cardiovascular;  Laterality: N/A;   CARDIAC DEFIBRILLATOR PLACEMENT     AICD Replaced-5/09 and 2014   LEFT AND RIGHT HEART CATHETERIZATION WITH CORONARY ANGIOGRAM N/A 10/06/2013   Procedure: LEFT AND RIGHT HEART CATHETERIZATION WITH CORONARY ANGIOGRAM;  Surgeon: Daniel R Bensimhon, MD;  Location: [facility] CATH LAB;  Service: Cardiovascular;  Laterality: N/A;   LITHOTRIPSY     h/o several procedures    MASTECTOMY Bilateral    OOPHORECTOMY Bilateral    triger finger, R ring finger Right 02/2019   VENOGRAM N/A 10/20/2012   Procedure: VENOGRAM;  Surgeon: Meldrick Buttery C Jenniffer Vessels, MD;  Location: [facility] CATH LAB;  Service: Cardiovascular;  Laterality: N/A;    Current   Outpatient Medications  Medication Sig Dispense Refill   amoxicillin (AMOXIL) 500 MG capsule Take 4 capsules 30 minutes before dental proedure. 4 capsule 3   ascorbic acid (VITAMIN C) 1000 MG tablet Take 1,000 mg by mouth daily.     atorvastatin (LIPITOR) 40 MG tablet Take 1 tablet (40 mg total) by mouth daily. 90 tablet 1   Biotin 5000 MCG CAPS Take 5,000 mcg by mouth daily.      carvedilol (COREG) 12.5 MG tablet Take 1 tablet by mouth in the morning and take 1 tablet at night 180 tablet 3   clopidogrel (PLAVIX) 75 MG tablet TAKE ONE TABLET BY MOUTH ONE TIME DAILY 90 tablet 3   fexofenadine (ALLEGRA ALLERGY) 60 MG tablet Take 1 tablet (60 mg total) by mouth 2 (two) times daily. (Patient taking differently: Take 60 mg by mouth 2 (two) times daily. prn)     furosemide (LASIX) 40 MG tablet Take 2  tablets (80 mg total) by mouth in the morning AND 1 tablet (40 mg total) every evening. Take 40mg every morning. May take an additional 20mg in the evening as needed.. 270 tablet 3   gabapentin (NEURONTIN) 300 MG capsule Take 1 capsule (300 mg total) by mouth daily as needed. 90 capsule 1   HYDROcodone bit-homatropine (HYCODAN) 5-1.5 MG/5ML syrup Take 5 mLs by mouth every 8 (eight) hours as needed for cough. 120 mL 0   multivitamin (THERAGRAN) per tablet Take 1 tablet by mouth daily.     pantoprazole (PROTONIX) 40 MG tablet Take 1 tablet (40 mg total) by mouth daily. 90 tablet 1   potassium chloride (KLOR-CON M) 10 MEQ tablet TAKE 3 TABLETS BY MOUTH ONCE A DAY 270 tablet 0   Probiotic Product (PROBIOTIC DAILY PO) Take 1 capsule by mouth daily.      sacubitril-valsartan (ENTRESTO) 49-51 MG Take 1 tablet by mouth 2 (two) times daily. 180 tablet 3   sertraline (ZOLOFT) 25 MG tablet TAKE ONE TABLET BY MOUTH ONE TIME DAILY 90 tablet 1   spironolactone (ALDACTONE) 25 MG tablet TAKE ONE TABLET BY MOUTH ONE TIME DAILY 90 tablet 0   tiZANidine (ZANAFLEX) 2 MG tablet Take 1 tablet (2 mg total) by mouth every 8 (eight) hours as needed for muscle spasms. 21 tablet 0   zolpidem (AMBIEN) 10 MG tablet take one-half to one tablet by mouth daily at bedtime as needed for sleep 90 tablet 0   azithromycin (ZITHROMAX) 250 MG tablet Take 2 tabs the first day and then 1 tab daily until you run out. 6 tablet 0   meclizine (ANTIVERT) 12.5 MG tablet Take 1-2 tablets (12.5-25 mg total) by mouth 3 (three) times daily as needed for dizziness (Motion sickness). 40 tablet 0   vitamin B-12 (CYANOCOBALAMIN) 1000 MCG tablet Take 1,000 mcg by mouth daily.     No current facility-administered medications for this visit.    Allergies  Allergen Reactions   Atacand [Candesartan] Other (See Comments)    Causes fatigue    Keflex [Cephalexin] Hives and Itching   Latex Rash    Review of Systems negative except from HPI and  PMH  Physical Exam BP 110/66   Pulse 75   Ht 5' 4" (1.626 m)   Wt 180 lb (81.6 kg)   SpO2 95%   BMI 30.90 kg/m  Well developed and well nourished in no acute distress HENT normal Neck supple with JVP-flat Clear Device pocket well healed; without hematoma or erythema.  There is no tethering    Regular rate and rhythm, no murmur Abd-soft with active BS No Clubbing cyanosis tr  edema Skin-warm and dry A & Oriented  Grossly normal sensory and motor function  ECG sinus with P synchronous pacing at 75 Intervals 11/07/1945 Upright QRS lead V1 and negative QRS lead I  Device function is normal. Programming changes none   See Paceart for details     Assessment and  Plan  Nonischemic cardiomyopathy/MR-   Congestive heart failure chronic systolic  Ventricular tachycardia-nonsustained  Hyperlipidemia  TIA  Implantable defibrillator-[employer]-CRT  .      No intercurrent sustained VT, Nonsustained noted continue carvedilol  With cardiomyopathy, continue entresto carvedilol and aldactone  previously intolerant of SGLT2  Last LDL at target  Euvolemic-almost-- continue furosemide and spiro        

## 2023-01-12 NOTE — Progress Notes (Signed)
After watching   seen    Patient Care Team: Colon Branch, MD as PCP - General Deboraha Sprang, MD as PCP - Electrophysiology (Cardiology) Bensimhon, Shaune Pascal, MD as Consulting Physician (Cardiology) Rod Can, MD as Consulting Physician (Orthopedic Surgery) Monna Fam, MD as Consulting Physician (Ophthalmology)   HPI  [name] is a 72 y.o. female seen in followup for congestive heart failure in the setting of chemotherapy associated cardiomyopathy with previously implanted CRT originally as part of the MADIT CRT protocol 02/27/2004 ; her LV lead had failed and was plugged. 02/26/2013 underwent CRT upgrade.    She has a history of a TIA.  9/18 aspirin was decreased to 81 and Plavix was added.  The patient denies chest pain, nocturnal dyspnea, orthopnea.  There have been no palpitations, lightheadedness or syncope.  Complains of dyspnea and fatigue.    Her twin brother and nephew both died 27-Feb-2020- devastating   DATE TEST EF   Feb 27, 2008 LHC  No angiogr CAD  27-Feb-2008 Echo  20%   11/14 Echo   10-15 %   7/15 Echo   10-15 % Severe RV dysfn  9/17 Echo  20-25% AI mod MR severe  6/18 Echo  20-25% MR mild AI mod  12/19 Echo  20/25% MR severe  eccentric  6/21 Echo  40-45%  MR-mild  9/22 Echo  40-45% MR mod      Date Cr K Dig  3/18  0.91 4.4 0.7  12/18 0.96 4.2 0.3 (6/18)  7/20 0.8 4.5 0.5  3/22 0.76 3.8   9/23 0.93 4.4 12.7(3/23)   Date LDL  8/17 118  5/20  60     Past Medical History:  Diagnosis Date   Allergy    Arthritis    Breast CA ([place])    twice first on L breast 1981-02-26 w/ chest wall involvment, then a second breast ca on the R in [id number]   Chronic systolic CHF (congestive heart failure) (HCC)    a. due to Adriamycin,s/p ICD;  b. 2013-02-26 Gen change and new LV lead - BSX Energen CRT-D BiV ICD, Ser # [id number]   Depression    Diverticulitis    Early menopause    early 60s   Fatigue 02/20/2016   Glaucoma suspect of both eyes    Dr. Marshall Cork   Heart murmur     Hyperlipidemia    Nonischemic cardiomyopathy ([place])    OSA (obstructive sleep apnea) 02/07/2016   Osteopenia    Renal calculus    Snoring 02/20/2016   TIA (transient ischemic attack)     Past Surgical History:  Procedure Laterality Date   BI-VENTRICULAR IMPLANTABLE CARDIOVERTER DEFIBRILLATOR UPGRADE N/A 02/04/2013   Procedure: BI-VENTRICULAR IMPLANTABLE CARDIOVERTER DEFIBRILLATOR UPGRADE;  Surgeon: Evans Lance, MD;  Location: [facility] CATH LAB;  Service: Cardiovascular;  Laterality: N/A;   CARDIAC DEFIBRILLATOR PLACEMENT     AICD Replaced-5/09 and 2013/02/26   LEFT AND RIGHT HEART CATHETERIZATION WITH CORONARY ANGIOGRAM N/A 10/06/2013   Procedure: LEFT AND RIGHT HEART CATHETERIZATION WITH CORONARY ANGIOGRAM;  Surgeon: Jolaine Artist, MD;  Location: [facility] CATH LAB;  Service: Cardiovascular;  Laterality: N/A;   LITHOTRIPSY     h/o several procedures    MASTECTOMY Bilateral    OOPHORECTOMY Bilateral    triger finger, R ring finger Right 02/2019   VENOGRAM N/A 10/20/2012   Procedure: VENOGRAM;  Surgeon: Deboraha Sprang, MD;  Location: [facility];  Service: Cardiovascular;  Laterality: N/A;    Current  Outpatient Medications  Medication Sig Dispense Refill   amoxicillin (AMOXIL) 500 MG capsule Take 4 capsules 30 minutes before dental proedure. 4 capsule 3   ascorbic acid (VITAMIN C) 1000 MG tablet Take 1,000 mg by mouth daily.     atorvastatin (LIPITOR) 40 MG tablet Take 1 tablet (40 mg total) by mouth daily. 90 tablet 1   Biotin 5000 MCG CAPS Take 5,000 mcg by mouth daily.      carvedilol (COREG) 12.5 MG tablet Take 1 tablet by mouth in the morning and take 1 tablet at night 180 tablet 3   clopidogrel (PLAVIX) 75 MG tablet TAKE ONE TABLET BY MOUTH ONE TIME DAILY 90 tablet 3   fexofenadine (ALLEGRA ALLERGY) 60 MG tablet Take 1 tablet (60 mg total) by mouth 2 (two) times daily. (Patient taking differently: Take 60 mg by mouth 2 (two) times daily. prn)     furosemide (LASIX) 40 MG tablet Take 2  tablets (80 mg total) by mouth in the morning AND 1 tablet (40 mg total) every evening. Take 44m every morning. May take an additional 279min the evening as needed.. 270 tablet 3   gabapentin (NEURONTIN) 300 MG capsule Take 1 capsule (300 mg total) by mouth daily as needed. 90 capsule 1   HYDROcodone bit-homatropine (HYCODAN) 5-1.5 MG/5ML syrup Take 5 mLs by mouth every 8 (eight) hours as needed for cough. 120 mL 0   multivitamin (THERAGRAN) per tablet Take 1 tablet by mouth daily.     pantoprazole (PROTONIX) 40 MG tablet Take 1 tablet (40 mg total) by mouth daily. 90 tablet 1   potassium chloride (KLOR-CON M) 10 MEQ tablet TAKE 3 TABLETS BY MOUTH ONCE A DAY 270 tablet 0   Probiotic Product (PROBIOTIC DAILY PO) Take 1 capsule by mouth daily.      sacubitril-valsartan (ENTRESTO) 49-51 MG Take 1 tablet by mouth 2 (two) times daily. 180 tablet 3   sertraline (ZOLOFT) 25 MG tablet TAKE ONE TABLET BY MOUTH ONE TIME DAILY 90 tablet 1   spironolactone (ALDACTONE) 25 MG tablet TAKE ONE TABLET BY MOUTH ONE TIME DAILY 90 tablet 0   tiZANidine (ZANAFLEX) 2 MG tablet Take 1 tablet (2 mg total) by mouth every 8 (eight) hours as needed for muscle spasms. 21 tablet 0   zolpidem (AMBIEN) 10 MG tablet take one-half to one tablet by mouth daily at bedtime as needed for sleep 90 tablet 0   azithromycin (ZITHROMAX) 250 MG tablet Take 2 tabs the first day and then 1 tab daily until you run out. 6 tablet 0   meclizine (ANTIVERT) 12.5 MG tablet Take 1-2 tablets (12.5-25 mg total) by mouth 3 (three) times daily as needed for dizziness (Motion sickness). 40 tablet 0   vitamin B-12 (CYANOCOBALAMIN) 1000 MCG tablet Take 1,000 mcg by mouth daily.     No current facility-administered medications for this visit.    Allergies  Allergen Reactions   Atacand [Candesartan] Other (See Comments)    Causes fatigue    Keflex [Cephalexin] Hives and Itching   Latex Rash    Review of Systems negative except from HPI and  PMH  Physical Exam BP 110/66   Pulse 75   Ht 5' 4"$  (1.626 m)   Wt 180 lb (81.6 kg)   SpO2 95%   BMI 30.90 kg/m  Well developed and well nourished in no acute distress HENT normal Neck supple with JVP-flat Clear Device pocket well healed; without hematoma or erythema.  There is no tethering  Regular rate and rhythm, no murmur Abd-soft with active BS No Clubbing cyanosis tr  edema Skin-warm and dry A & Oriented  Grossly normal sensory and motor function  ECG sinus with P synchronous pacing at 75 Intervals 11/07/1945 Upright QRS lead V1 and negative QRS lead I  Device function is normal. Programming changes none   See Paceart for details     Assessment and  Plan  Nonischemic cardiomyopathy/MR-   Congestive heart failure chronic systolic  Ventricular tachycardia-nonsustained  Hyperlipidemia  TIA  Implantable defibrillator-[employer]-CRT  .      No intercurrent sustained VT, Nonsustained noted continue carvedilol  With cardiomyopathy, continue entresto carvedilol and aldactone  previously intolerant of SGLT2  Last LDL at target  Euvolemic-almost-- continue furosemide and spiro

## 2023-01-30 ENCOUNTER — Ambulatory Visit: Payer: Medicare Other | Attending: Internal Medicine

## 2023-01-30 DIAGNOSIS — I5022 Chronic systolic (congestive) heart failure: Secondary | ICD-10-CM

## 2023-01-30 DIAGNOSIS — Z95810 Presence of automatic (implantable) cardiac defibrillator: Secondary | ICD-10-CM | POA: Diagnosis not present

## 2023-01-30 DIAGNOSIS — Z01812 Encounter for preprocedural laboratory examination: Secondary | ICD-10-CM

## 2023-01-30 DIAGNOSIS — I4729 Other ventricular tachycardia: Secondary | ICD-10-CM

## 2023-01-30 DIAGNOSIS — I428 Other cardiomyopathies: Secondary | ICD-10-CM | POA: Diagnosis not present

## 2023-01-31 LAB — BASIC METABOLIC PANEL
BUN/Creatinine Ratio: 19 (ref 12–28)
BUN: 15 mg/dL (ref 8–27)
CO2: 22 mmol/L (ref 20–29)
Calcium: 9.9 mg/dL (ref 8.7–10.3)
Chloride: 103 mmol/L (ref 96–106)
Creatinine, Ser: 0.79 mg/dL (ref 0.57–1.00)
Glucose: 103 mg/dL — ABNORMAL HIGH (ref 70–99)
Potassium: 4.4 mmol/L (ref 3.5–5.2)
Sodium: 140 mmol/L (ref 134–144)
eGFR: 79 mL/min/{1.73_m2} (ref >59)

## 2023-01-31 LAB — CBC
Hematocrit: 37.2 % (ref 34.0–46.6)
Hemoglobin: 12.2 g/dL (ref 11.1–15.9)
MCH: 30.8 pg (ref 26.6–33.0)
MCHC: 32.8 g/dL (ref 31.5–35.7)
MCV: 94 fL (ref 79–97)
Platelets: 274 10*3/uL (ref 150–450)
RBC: 3.96 x10E6/uL (ref 3.77–5.28)
RDW: 12.6 % (ref 11.7–15.4)
WBC: 5.2 10*3/uL (ref 3.4–10.8)

## 2023-02-03 ENCOUNTER — Ambulatory Visit (INDEPENDENT_AMBULATORY_CARE_PROVIDER_SITE_OTHER): Payer: Medicare Other

## 2023-02-03 DIAGNOSIS — I428 Other cardiomyopathies: Secondary | ICD-10-CM

## 2023-02-04 ENCOUNTER — Telehealth: Payer: Self-pay

## 2023-02-04 NOTE — Telephone Encounter (Signed)
Pt aware of her procedure time change. She is to arrive at 9:30 am.

## 2023-02-05 LAB — CUP PACEART REMOTE DEVICE CHECK
Brady Statistic RA Percent Paced: 0 %
Brady Statistic RV Percent Paced: 100 %
Date Time Interrogation Session: 20240312002900
HighPow Impedance: 63 Ohm
Implantable Lead Connection Status: 753985
Implantable Lead Connection Status: 753985
Implantable Lead Connection Status: 753985
Implantable Lead Implant Date: 20050701
Implantable Lead Implant Date: 20050701
Implantable Lead Implant Date: 20140314
Implantable Lead Location: 753858
Implantable Lead Location: 753859
Implantable Lead Location: 753860
Implantable Lead Model: 158
Implantable Lead Model: 4196
Implantable Lead Model: 5076
Implantable Lead Serial Number: 156416
Implantable Pulse Generator Implant Date: 20140314
Lead Channel Impedance Value: 467 Ohm
Lead Channel Impedance Value: 481 Ohm
Lead Channel Impedance Value: 622 Ohm
Lead Channel Pacing Threshold Amplitude: 0.6 V
Lead Channel Pacing Threshold Amplitude: 1.1 V
Lead Channel Pacing Threshold Amplitude: 1.2 V
Lead Channel Pacing Threshold Pulse Width: 0.4 ms
Lead Channel Pacing Threshold Pulse Width: 0.4 ms
Lead Channel Pacing Threshold Pulse Width: 0.6 ms
Lead Channel Setting Pacing Amplitude: 2 V
Lead Channel Setting Pacing Amplitude: 2 V
Lead Channel Setting Pacing Amplitude: 2 V
Lead Channel Setting Pacing Pulse Width: 0.4 ms
Lead Channel Setting Pacing Pulse Width: 0.6 ms
Lead Channel Setting Sensing Sensitivity: 0.5 mV
Lead Channel Setting Sensing Sensitivity: 1 mV
Pulse Gen Serial Number: 111235

## 2023-02-06 ENCOUNTER — Ambulatory Visit (HOSPITAL_COMMUNITY)
Admission: RE | Disposition: A | Discharge status: Home / Self Care | Payer: Self-pay | Source: Home / Self Care | Attending: Internal Medicine

## 2023-02-06 ENCOUNTER — Other Ambulatory Visit: Payer: Self-pay

## 2023-02-06 ENCOUNTER — Ambulatory Visit (HOSPITAL_COMMUNITY)
Admission: RE | Admit: 2023-02-06 | Discharge: 2023-02-06 | Disposition: A | Discharge status: Home / Self Care | Payer: Medicare Other | Attending: Internal Medicine | Admitting: Internal Medicine

## 2023-02-06 DIAGNOSIS — Z79899 Other long term (current) drug therapy: Secondary | ICD-10-CM | POA: Insufficient documentation

## 2023-02-06 DIAGNOSIS — I5022 Chronic systolic (congestive) heart failure: Secondary | ICD-10-CM | POA: Insufficient documentation

## 2023-02-06 DIAGNOSIS — I429 Cardiomyopathy, unspecified: Secondary | ICD-10-CM

## 2023-02-06 DIAGNOSIS — I428 Other cardiomyopathies: Secondary | ICD-10-CM | POA: Insufficient documentation

## 2023-02-06 DIAGNOSIS — E785 Hyperlipidemia, unspecified: Secondary | ICD-10-CM | POA: Diagnosis not present

## 2023-02-06 DIAGNOSIS — G459 Transient cerebral ischemic attack, unspecified: Secondary | ICD-10-CM | POA: Diagnosis not present

## 2023-02-06 DIAGNOSIS — Z4502 Encounter for adjustment and management of automatic implantable cardiac defibrillator: Secondary | ICD-10-CM | POA: Insufficient documentation

## 2023-02-06 DIAGNOSIS — I472 Ventricular tachycardia, unspecified: Secondary | ICD-10-CM | POA: Diagnosis not present

## 2023-02-06 HISTORY — PX: BIV ICD GENERATOR CHANGEOUT: EP1194

## 2023-02-06 SURGERY — BIV ICD GENERATOR CHANGEOUT
Anesthesia: LOCAL

## 2023-02-06 MED ORDER — ACETAMINOPHEN 325 MG PO TABS: 325.0000 mg | ORAL_TABLET | ORAL | Status: DC | PRN

## 2023-02-06 MED ORDER — LIDOCAINE HCL (PF) 1 % IJ SOLN
INTRAMUSCULAR | Status: AC
  Filled 2023-02-06: qty 60

## 2023-02-06 MED ORDER — LIDOCAINE HCL (PF) 1 % IJ SOLN
INTRAMUSCULAR | Status: DC | PRN
  Administered 2023-02-06: 50 mL

## 2023-02-06 MED ORDER — MIDAZOLAM HCL 5 MG/5ML IJ SOLN
INTRAMUSCULAR | Status: AC
  Filled 2023-02-06: qty 5

## 2023-02-06 MED ORDER — MIDAZOLAM HCL 2 MG/2ML IJ SOLN
INTRAMUSCULAR | Status: DC | PRN
  Administered 2023-02-06 (×3): 1 mg via INTRAVENOUS
  Administered 2023-02-06: 2 mg via INTRAVENOUS

## 2023-02-06 MED ORDER — FENTANYL CITRATE (PF) 100 MCG/2ML IJ SOLN
INTRAMUSCULAR | Status: DC | PRN
  Administered 2023-02-06: 25 ug via INTRAVENOUS
  Administered 2023-02-06: 50 ug via INTRAVENOUS
  Administered 2023-02-06: 25 ug via INTRAVENOUS

## 2023-02-06 MED ORDER — POVIDONE-IODINE 10 % EX SWAB
2.0000 | Freq: Once | CUTANEOUS | Status: AC
  Administered 2023-02-06: 2 via TOPICAL

## 2023-02-06 MED ORDER — SODIUM CHLORIDE 0.9 % IV SOLN: INTRAVENOUS | Status: DC

## 2023-02-06 MED ORDER — CHLORHEXIDINE GLUCONATE 4 % EX LIQD: 4.0000 | Freq: Once | CUTANEOUS | Status: DC

## 2023-02-06 MED ORDER — LIDOCAINE HCL 1 % IJ SOLN
INTRAMUSCULAR | Status: AC
  Filled 2023-02-06: qty 20

## 2023-02-06 MED ORDER — SODIUM CHLORIDE 0.9 % IV SOLN
80.0000 mg | INTRAVENOUS | Status: AC
  Administered 2023-02-06: 80 mg

## 2023-02-06 MED ORDER — FENTANYL CITRATE (PF) 100 MCG/2ML IJ SOLN
INTRAMUSCULAR | Status: AC
  Filled 2023-02-06: qty 2

## 2023-02-06 MED ORDER — VANCOMYCIN HCL IN DEXTROSE 1-5 GM/200ML-% IV SOLN
1000.0000 mg | INTRAVENOUS | Status: AC
  Administered 2023-02-06: 1000 mg via INTRAVENOUS

## 2023-02-06 SURGICAL SUPPLY — 9 items
CABLE SURGICAL S-101-97-12 (CABLE) IMPLANT
DEVICE DISSECT PLASMABLAD 3.0S (MISCELLANEOUS) IMPLANT
ELECT DEFIB PAD ADLT CADENCE (PAD) IMPLANT
HEMOSTAT SURGICEL 2X4 FIBR (HEMOSTASIS) IMPLANT
ICD MOMENTUM G125 (ICD Generator) IMPLANT
PLASMABLADE 3.0S (MISCELLANEOUS) ×1
POUCH AIGIS-R ANTIBACT ICD (Mesh General) ×1 IMPLANT
POUCH AIGIS-R ANTIBACT ICD LRG (Mesh General) IMPLANT
TRAY PACEMAKER INSERTION (PACKS) IMPLANT

## 2023-02-06 NOTE — Discharge Instructions (Signed)
Implantable Cardiac Device Battery Change, Care After  Resume Plavix on Monday, 3/18  This sheet gives you information about how to care for yourself after your procedure. Your health care provider may also give you more specific instructions. If you have problems or questions, contact your health care provider. What can I expect after the procedure? After your procedure, it is common to have: Pain or soreness at the site where the cardiac device was inserted. Swelling at the site where the cardiac device was inserted. You should received an information card for your new device in 4-8 weeks. Follow these instructions at home: Incision care  Keep the incision clean and dry. Do not take baths, swim, or use a hot tub until after your wound check.  Do not shower for at least 7 days, or as directed by your health care provider. Pat the area dry with a clean towel. Do not rub the area. This may cause bleeding. Follow instructions from your health care provider about how to take care of your incision. Make sure you: Leave stitches (sutures), skin glue, or adhesive strips in place. These skin closures may need to stay in place for 2 weeks or longer. If adhesive strip edges start to loosen and curl up, you may trim the loose edges. Do not remove adhesive strips completely unless your health care provider tells you to do that. Check your incision area every day for signs of infection. Check for: More redness, swelling, or pain. More fluid or blood. Warmth. Pus or a bad smell. Activity Do not lift anything that is heavier than 10 lb (4.5 kg) until your health care provider says it is okay to do so. For the first week, or as long as told by your health care provider: Avoid lifting your affected arm higher than your shoulder. After 1 week, Be gentle when you move your arms over your head. It is okay to raise your arm to comb your hair. Avoid strenuous exercise. Ask your health care provider when it  is okay to: Resume your normal activities. Return to work or school. Resume sexual activity. Eating and drinking Eat a heart-healthy diet. This should include plenty of fresh fruits and vegetables, whole grains, low-fat dairy products, and lean protein like chicken and fish. Limit alcohol intake to no more than 1 drink a day for non-pregnant women and 2 drinks a day for men. One drink equals 12 oz of beer, 5 oz of wine, or 1 oz of hard liquor. Check ingredients and nutrition facts on packaged foods and beverages. Avoid the following types of food: Food that is high in salt (sodium). Food that is high in saturated fat, like full-fat dairy or red meat. Food that is high in trans fat, like fried food. Food and drinks that are high in sugar. Lifestyle Do not use any products that contain nicotine or tobacco, such as cigarettes and e-cigarettes. If you need help quitting, ask your health care provider. Take steps to manage and control your weight. Once cleared, get regular exercise. Aim for 150 minutes of moderate-intensity exercise (such as walking or yoga) or 75 minutes of vigorous exercise (such as running or swimming) each week. Manage other health problems, such as diabetes or high blood pressure. Ask your health care provider how you can manage these conditions. General instructions Do not drive for 24 hours after your procedure if you were given a medicine to help you relax (sedative). Take over-the-counter and prescription medicines only as told by your  health care provider. Avoid putting pressure on the area where the cardiac device was placed. If you need an MRI after your cardiac device has been placed, be sure to tell the health care provider who orders the MRI that you have a cardiac device. Avoid close and prolonged exposure to electrical devices that have strong magnetic fields. These include: Cell phones. Avoid keeping them in a pocket near the cardiac device, and try using the ear  opposite the cardiac device. MP3 players. Household appliances, like microwaves. Metal detectors. Electric generators. High-tension wires. Keep all follow-up visits as directed by your health care provider. This is important. Contact a health care provider if: You have pain at the incision site that is not relieved by over-the-counter or prescription medicines. You have any of these around your incision site or coming from it: More redness, swelling, or pain. Fluid or blood. Warmth to the touch. Pus or a bad smell. You have a fever. You feel brief, occasional palpitations, light-headedness, or any symptoms that you think might be related to your heart. Get help right away if: You experience chest pain that is different from the pain at the cardiac device site. You develop a red streak that extends above or below the incision site. You experience shortness of breath. You have palpitations or an irregular heartbeat. You have light-headedness that does not go away quickly. You faint or have dizzy spells. Your pulse suddenly drops or increases rapidly and does not return to normal. You begin to gain weight and your legs and ankles swell. Summary After your procedure, it is common to have pain, soreness, and some swelling where the cardiac device was inserted. Make sure to keep your incision clean and dry. Follow instructions from your health care provider about how to take care of your incision. Check your incision every day for signs of infection, such as more pain or swelling, pus or a bad smell, warmth, or leaking fluid and blood. Avoid strenuous exercise and lifting your left arm higher than your shoulder for 2 weeks, or as long as told by your health care provider. This information is not intended to replace advice given to you by your health care provider. Make sure you discuss any questions you have with your health care provider.

## 2023-02-06 NOTE — Progress Notes (Signed)
Per MD request, sling placed to pt's L arm by ortho tech.

## 2023-02-06 NOTE — Interval H&P Note (Signed)
History and Physical Interval Note:  02/06/2023 10:30 AM  [name]  has presented today for surgery, with the diagnosis of biv icd at [employer].  The various methods of treatment have been discussed with the patient and family. After consideration of risks, benefits and other options for treatment, the patient has consented to  Procedure(s): BIV ICD [place] (N/A) as a surgical intervention.  The patient's history has been reviewed, patient examined, no change in status, stable for surgery.  I have reviewed the patient's chart and labs.  Questions were answered to the patient's satisfaction.     [name]

## 2023-02-09 ENCOUNTER — Other Ambulatory Visit: Payer: Self-pay | Admitting: Internal Medicine

## 2023-02-09 ENCOUNTER — Encounter (HOSPITAL_COMMUNITY): Payer: Self-pay | Admitting: Internal Medicine

## 2023-02-09 ENCOUNTER — Ambulatory Visit (INDEPENDENT_AMBULATORY_CARE_PROVIDER_SITE_OTHER): Payer: Medicare Other | Admitting: Internal Medicine

## 2023-02-09 ENCOUNTER — Ambulatory Visit (HOSPITAL_BASED_OUTPATIENT_CLINIC_OR_DEPARTMENT_OTHER)
Admission: RE | Admit: 2023-02-09 | Discharge: 2023-02-09 | Disposition: A | Discharge status: Home / Self Care | Payer: Medicare Other | Source: Ambulatory Visit | Attending: Internal Medicine | Admitting: Internal Medicine

## 2023-02-09 VITALS — BP 134/70 | HR 73 | Temp 98.3°F | Resp 16 | Ht 64.0 in | Wt 182.0 lb

## 2023-02-09 DIAGNOSIS — R739 Hyperglycemia, unspecified: Secondary | ICD-10-CM

## 2023-02-09 DIAGNOSIS — R059 Cough, unspecified: Secondary | ICD-10-CM

## 2023-02-09 DIAGNOSIS — E559 Vitamin D deficiency, unspecified: Secondary | ICD-10-CM

## 2023-02-09 DIAGNOSIS — E785 Hyperlipidemia, unspecified: Secondary | ICD-10-CM | POA: Diagnosis not present

## 2023-02-09 DIAGNOSIS — Z0000 Encounter for general adult medical examination without abnormal findings: Secondary | ICD-10-CM | POA: Diagnosis not present

## 2023-02-09 LAB — AST: AST: 19 U/L (ref 0–37)

## 2023-02-09 LAB — HEMOGLOBIN A1C: Hgb A1c MFr Bld: 6.4 % (ref 4.6–6.5)

## 2023-02-09 LAB — ALT: ALT: 18 U/L (ref 0–35)

## 2023-02-09 MED ORDER — BENZONATATE 200 MG PO CAPS: 200.0000 mg | ORAL_CAPSULE | Freq: Every evening | ORAL | 1 refills | Status: DC | PRN

## 2023-02-09 MED FILL — Midazolam HCl Inj 5 MG/5ML (Base Equivalent): INTRAMUSCULAR | Qty: 5 | Status: AC

## 2023-02-09 MED FILL — Lidocaine HCl Local Inj 1%: INTRAMUSCULAR | Qty: 50 | Status: AC

## 2023-02-09 NOTE — Patient Instructions (Addendum)
Vaccines I recommend:  Covid booster Shingrix (shingles) RSV vaccine  For cough: Mucinex DM as needed. Flonase 2 sprays on each side of the nose daily Tessalon Perles at bedtime.  Check the  blood pressure regularly BP GOAL is between 110/65 and  135/85. If it is consistently higher or lower, let me know v  GO TO THE LAB : Get the blood work     [place], [place] back for a checkup in 6 months.  Sooner if needed    Go to the first floor, get an x-ray of your chest   "[place] of attorney" ,  "Living will" (Advance care planning documents)  If you already have a living will or healthcare power of attorney, is recommended you bring the copy to be scanned in your chart.   The document will be available to all the doctors you see in the system.  Advance care planning is a process that supports adults in  understanding and sharing their preferences regarding future medical care.  The patient's preferences are recorded in documents called Advance Directives and the can be modified at any time while the patient is in full mental capacity.   If you don't have one, please consider create one.      More information at: [email or web address]

## 2023-02-09 NOTE — Assessment & Plan Note (Signed)
Here for CPX Hyperglycemia: Checking A1c High cholesterol: On atorvastatin, well-controlled Nonischemic cardiomyopathy, MR.    On multiple meds, Saw Dr. Caryl Comes 01/12/2023, felt to be stable, just had a   ICD generator changed out. Vitamin D deficiency: On supplements, checking labs Cough: Chronic, admits to some allergies, no GERD, some sputum production, check a chest x-ray.  Recommend Flonase.  Tessalon Perles at night.  Call if not gradually better MSK: Ports chronic neck pain along with all her aches and pains, occasionally takes Zanaflex. RTC 6 months

## 2023-02-09 NOTE — Progress Notes (Signed)
Subjective:    Patient ID: [name], female    DOB: 05/15/1951, 72 y.o.   MRN: [id number]  DOS:  02/09/2023 Type of visit - description: cpx  Here for CPX. Reports a chronic cough, for several months.  Slightly worse at night, on and off sxs, productive of greenish sputum. Denies chest congestion, admits to some allergies including nasal congestion and postnasal dripping. No GERD.  Review of Systems  Other than above, a 14 point review of systems is negative     Past Medical History:  Diagnosis Date   Allergy    Arthritis    Breast CA ([place])    twice first on L breast 1982 w/ chest wall involvment, then a second breast ca on the R in [id number]   Chronic systolic CHF (congestive heart failure) ([place])    a. due to Adriamycin,s/p ICD;  b. 01/2013 Gen change and new LV lead - BSX Energen CRT-D BiV ICD, Ser # [id number]   Depression    Diverticulitis    Early menopause    early 30s   Fatigue 02/20/2016   Glaucoma suspect of both eyes    Dr. Marshall Cork   Heart murmur    Hyperlipidemia    Nonischemic cardiomyopathy ([place])    OSA (obstructive sleep apnea) 02/07/2016   Osteopenia    Renal calculus    Snoring 02/20/2016   TIA (transient ischemic attack)     Past Surgical History:  Procedure Laterality Date   BI-VENTRICULAR IMPLANTABLE CARDIOVERTER DEFIBRILLATOR UPGRADE N/A 02/04/2013   Procedure: BI-VENTRICULAR IMPLANTABLE CARDIOVERTER DEFIBRILLATOR UPGRADE;  Surgeon: Evans Lance, MD;  Location: [facility] CATH LAB;  Service: Cardiovascular;  Laterality: N/A;   BIV ICD GENERATOR CHANGEOUT N/A 02/06/2023   Procedure: BIV ICD GENERATOR CHANGEOUT;  Surgeon: Deboraha Sprang, MD;  Location: [facility] CV LAB;  Service: Cardiovascular;  Laterality: N/A;   CARDIAC DEFIBRILLATOR PLACEMENT     AICD Replaced-5/09 and 2014   LEFT AND RIGHT HEART CATHETERIZATION WITH CORONARY ANGIOGRAM N/A 10/06/2013   Procedure: LEFT AND RIGHT HEART CATHETERIZATION WITH CORONARY ANGIOGRAM;  Surgeon:  Jolaine Artist, MD;  Location: [facility] CATH LAB;  Service: Cardiovascular;  Laterality: N/A;   LITHOTRIPSY     h/o several procedures    MASTECTOMY Bilateral    OOPHORECTOMY Bilateral    triger finger, R ring finger Right 02/2019   VENOGRAM N/A 10/20/2012   Procedure: VENOGRAM;  Surgeon: Deboraha Sprang, MD;  Location[facility] CATH LAB;  Service: Cardiovascular;  Laterality: N/A;   Social History   Socioeconomic History   Marital status: Married    Spouse name: Enriqueta Shutter   Number of children: 3   Years of education: Not on file   Highest education level: Not on file  Occupational History   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  Tobacco Use   Smoking status: Never   Smokeless tobacco: Never  Vaping Use   Vaping Use: Never used  Substance and Sexual Activity   Alcohol use: Yes    Comment: socially/occassionally   Drug use: No   Sexual activity: Not on file  Other Topics Concern   Not on file  Social History Narrative   Related to Mr and Mrs Huston Foley, they are my patients as well    Married, 3 children all in [place], 8 Gkids     Siblings, 2 deceased, 2 living brothers    Social Determinants of Engineer, drilling  Resource Strain: Low Risk  (08/03/2021)   Overall Financial Resource Strain (CARDIA)    Difficulty of Paying Living Expenses: Not hard at all  Food Insecurity: No Food Insecurity (08/03/2021)   Hunger Vital Sign    Worried About Running Out of Food in the Last Year: Never true    Ran Out of Food in the Last Year: Never true  Transportation Needs: No Transportation Needs (08/03/2021)   PRAPARE - Hydrologist (Medical): No    Lack of Transportation (Non-Medical): No  Physical Activity: Sufficiently Active (08/03/2021)   Exercise Vital Sign    Days of Exercise per Week: 5 days    Minutes of Exercise per Session: 30 min  Stress: No Stress Concern Present (08/03/2021)   [place]    Feeling of Stress : Not at all  Social Connections: [place] (08/03/2021)   Social Connection and Isolation Panel [NHANES]    Frequency of Communication with Friends and Family: More than three times a week    Frequency of Social Gatherings with Friends and Family: More than three times a week    Attends Religious Services: More than 4 times per year    Active Member of [employer] or Organizations: Yes    Attends Archivist Meetings: Not on file    Marital Status: Married  Intimate Partner Violence: Not At Risk (08/03/2021)   Humiliation, Afraid, Rape, and Kick questionnaire    Fear of Current or Ex-Partner: No    Emotionally Abused: No    Physically Abused: No    Sexually Abused: No     Current Outpatient Medications  Medication Instructions   amoxicillin (AMOXIL) 500 MG capsule Take 4 capsules 30 minutes before dental proedure.   atorvastatin (LIPITOR) 40 mg, Oral, Daily   benzonatate (TESSALON) 200 mg, Oral, At bedtime PRN   carvedilol (COREG) 12.5 MG tablet Take 1 tablet by mouth in the morning and take 1 tablet at night   cholecalciferol 1,000 Units, Oral, Daily   clopidogrel (PLAVIX) 75 MG tablet TAKE ONE TABLET BY MOUTH ONE TIME DAILY   fexofenadine (ALLEGRA ALLERGY) 60 mg, Oral, 2 times daily   furosemide (LASIX) 40 MG tablet Take 2 tablets (80 mg total) by mouth in the morning AND 1 tablet (40 mg total) every evening. Take 40mg  every morning. May take an additional 20mg  in the evening as needed..   gabapentin (NEURONTIN) 300 mg, Oral, Daily PRN   meclizine (ANTIVERT) 12.5-25 mg, Oral, 3 times daily PRN   Multiple Vitamin (MULTIVITAMIN) capsule 1 capsule, Oral, Daily, Focus factor   Multiple Vitamins-Minerals (ICAPS AREDS 2 PO) 1 tablet, Oral, Daily   Omega 3 1,000 mg, Oral, Daily   pantoprazole (PROTONIX) 40 mg, Oral, Daily   Polyvinyl Alcohol-Povidone PF (REFRESH) 1.4-0.6 % SOLN 1 drop, Both Eyes,  Daily PRN   potassium chloride (KLOR-CON M) 10 MEQ tablet TAKE 3 TABLETS BY MOUTH ONCE A DAY   sacubitril-valsartan (ENTRESTO) 49-51 MG 1 tablet, Oral, 2 times daily   sertraline (ZOLOFT) 25 mg, Oral, Daily   spironolactone (ALDACTONE) 25 MG tablet TAKE ONE TABLET BY MOUTH ONE TIME DAILY   tiZANidine (ZANAFLEX) 2 mg, Oral, Every 8 hours PRN   TURMERIC PO 1,000 mg, Oral, 2 times daily   zinc gluconate 50 mg, Oral, Daily   zolpidem (AMBIEN) 10 MG tablet take one-half to one tablet by mouth daily at bedtime as needed for sleep  Objective:   Physical Exam BP 134/70   Pulse 73   Temp 98.3 F (36.8 C) (Oral)   Resp 16   Ht 5\' 4"  (1.626 m)   Wt 182 lb (82.6 kg)   SpO2 97%   BMI 31.24 kg/m  General: Well developed, NAD, BMI noted Neck: No  thyromegaly  HEENT:  Normocephalic . Face symmetric, atraumatic Lungs:  CTA B Normal respiratory effort, no intercostal retractions, no accessory muscle use. Heart: RRR, significant murmur noted Abdomen:  Not distended, soft, non-tender. No rebound or rigidity.   Lower extremities: no pretibial edema bilaterally  Skin: Exposed areas without rash. Not pale. Not jaundice Neurologic:  alert & oriented X3.  Speech normal, gait appropriate for age and unassisted Strength symmetric and appropriate for age.  Psych: Cognition and judgment appear intact.  Cooperative with normal attention span and concentration.  Behavior appropriate. No anxious or depressed appearing.     Assessment    Assessment Hyperglycemia A1c 5.9 (04/2017) High cholesterol CV:Dr. Caryl Comes and [facility] --CHF, nonischemic cardiomyopathy:due to chemotherapy --MR severe --VT -TIA 05-2017 Depression, insomnia Mild OSA per sleep study 11-2015, saw Dr Radford Pax 02-20-16: no need for CPAP Osteopenia-- DEXA 06/13/2015 normal Vitamin D deficiency Kidney stones Menopause Breast cancer x 2: first in 1982 with chest wall involvement, 2nd on the R dx  1987, s/p B mastectomy; +  BRACA Handicap sticker signed 06-2016 Post herpetic neuralgia (shingles 2014) , R face-- gaba prn  PLAN: Here for CPX Hyperglycemia: Checking A1c High cholesterol: On atorvastatin, well-controlled Nonischemic cardiomyopathy, MR.    On multiple meds, Saw Dr. Caryl Comes 01/12/2023, felt to be stable, just had a   ICD generator changed out. Vitamin D deficiency: On supplements, checking labs Cough: Chronic, admits to some allergies, no GERD, some sputum production, check a chest x-ray.  Recommend Flonase.  Tessalon Perles at night.  Call if not gradually better MSK: Ports chronic neck pain along with all her aches and pains, occasionally takes Zanaflex. RTC 6 months

## 2023-02-09 NOTE — Assessment & Plan Note (Signed)
-  Td 2023 -  pnm 23:  2017;  prevnar:2015.  PNM 20   01/27/2022 - zostavax 2013 - shingrix, RSV, covid vax --- recommended  - had a flu shot  per pt   - Cscope 2003, and 11-2011, cscope 06/2022, next per GI -  No further cervical ca screening.  See previous entries - h/o breast cancer, s/p  B mastectomy , self chest examination within normal per patient. -DEXA 2016, DEXA 12/2019, 2022: osteopenia, rx vitamin D supplements -Labs: AST ALT A1c vitamin D -Follows a healthy diet. - -ACP d/w pt

## 2023-02-10 LAB — VITAMIN D 25 HYDROXY (VIT D DEFICIENCY, FRACTURES): VITD: 26.07 ng/mL — ABNORMAL LOW (ref 30.00–100.00)

## 2023-02-12 ENCOUNTER — Other Ambulatory Visit (HOSPITAL_COMMUNITY): Payer: Self-pay

## 2023-02-12 MED ORDER — VITAMIN D (ERGOCALCIFEROL) 1.25 MG (50000 UNIT) PO CAPS: 50000.0000 [IU] | ORAL_CAPSULE | ORAL | 0 refills | Status: DC

## 2023-02-12 MED ORDER — SPIRONOLACTONE 25 MG PO TABS: 25.0000 mg | ORAL_TABLET | Freq: Every day | ORAL | 1 refills | Status: DC

## 2023-02-12 NOTE — Addendum Note (Signed)
Addended byDamita Dunnings D on: 02/12/2023 07:47 AM   Modules accepted: Orders

## 2023-02-18 ENCOUNTER — Ambulatory Visit: Payer: Medicare Other | Attending: Internal Medicine

## 2023-02-18 DIAGNOSIS — I428 Other cardiomyopathies: Secondary | ICD-10-CM | POA: Diagnosis not present

## 2023-02-18 LAB — CUP PACEART INCLINIC DEVICE CHECK
Brady Statistic RA Percent Paced: 1 % — CL
Brady Statistic RV Percent Paced: 100 %
Date Time Interrogation Session: 20240327094853
HighPow Impedance: 50 Ohm
Implantable Lead Connection Status: 753985
Implantable Lead Connection Status: 753985
Implantable Lead Connection Status: 753985
Implantable Lead Implant Date: 20050701
Implantable Lead Implant Date: 20050701
Implantable Lead Implant Date: 20140314
Implantable Lead Location: 753858
Implantable Lead Location: 753859
Implantable Lead Location: 753860
Implantable Lead Model: 158
Implantable Lead Model: 4196
Implantable Lead Model: 5076
Implantable Lead Serial Number: 156416
Implantable Pulse Generator Implant Date: 20240315
Lead Channel Impedance Value: 464 Ohm
Lead Channel Impedance Value: 478 Ohm
Lead Channel Impedance Value: 613 Ohm
Lead Channel Pacing Threshold Amplitude: 0.6 V
Lead Channel Pacing Threshold Amplitude: 1 V
Lead Channel Pacing Threshold Amplitude: 1.2 V
Lead Channel Pacing Threshold Pulse Width: 0.4 ms
Lead Channel Pacing Threshold Pulse Width: 0.4 ms
Lead Channel Pacing Threshold Pulse Width: 0.6 ms
Lead Channel Sensing Intrinsic Amplitude: 21.5 mV
Lead Channel Sensing Intrinsic Amplitude: 3.9 mV
Lead Channel Sensing Intrinsic Amplitude: 9.7 mV
Lead Channel Setting Pacing Amplitude: 2 V
Lead Channel Setting Pacing Amplitude: 2 V
Lead Channel Setting Pacing Amplitude: 2 V
Lead Channel Setting Pacing Pulse Width: 0.4 ms
Lead Channel Setting Pacing Pulse Width: 0.6 ms
Lead Channel Setting Sensing Sensitivity: 0.6 mV
Lead Channel Setting Sensing Sensitivity: 1 mV
Pulse Gen Serial Number: 159739

## 2023-02-18 NOTE — Progress Notes (Signed)
Wound check appointment. Steri-strips removed. Wound without edema.  Incision edges approximated, there is 1 small area just below distal end of incision line that is red, with scant amount of bleeding.  No evidence of a stitch present but needs monitoring. No sign of infection.  Area cleansed and instructions given for patient to wash area bid with dial soap and water, with clean cloth each time.  Monitor for infection and call device clinic if does not continue to heal.  Okay to shower.    Normal CRT-D device function. BI-VP 100%. Thresholds, sensing, and impedances consistent with implant measurements. Device programmed for appropriate safety margin for chronic leads.  This was a gen change only. Histogram distribution appropriate for patient and level of activity. No mode switches or ventricular arrhythmias noted. Patient educated about wound care and shock plan, no restrictions on arm movement. ROV in 3 months with implanting physician.

## 2023-02-18 NOTE — Patient Instructions (Signed)
   After Your ICD (Implantable Cardiac Defibrillator)    Monitor your defibrillator site for redness, swelling, and drainage. Call the device clinic at [phone] if you experience these symptoms or fever/chills.  Your incision was closed with Steri-strips or staples:  You may shower 7 days after your procedure and wash your incision with soap and water. Avoid lotions, ointments, or perfumes over your incision until it is well-healed.  You have 1 small area of your wound that is still healing.  Cleanse with soap (dial) and water twice daily with clean cloth/towel each time.  Monitor for signs of infection, if any concerns please call device clinic at above number.    You may use a hot tub or a pool after your wound check appointment if the incision is completely closed.  There are no other restrictions in arm movement after your wound check appointment.   Your ICD is designed to protect you from life threatening heart rhythms. Because of this, you may receive a shock.   1 shock with no symptoms:  Call the office during business hours. 1 shock with symptoms (chest pain, chest pressure, dizziness, lightheadedness, shortness of breath, overall feeling unwell):  Call 911. If you experience 2 or more shocks in 24 hours:  Call 911. If you receive a shock, you should not drive.  [place] DMV - no driving for 6 months if you receive appropriate therapy from your ICD.   ICD Alerts:  Some alerts are vibratory and others beep. These are NOT emergencies. Please call our office to let us know. If this occurs at night or on weekends, it can wait until the next business day. Send a remote transmission.  If your device is capable of reading fluid status (for heart failure), you will be offered monthly monitoring to review this with you.   Remote monitoring is used to monitor your ICD from home. This monitoring is scheduled every 91 days by our office. It allows [place] to keep an eye on the functioning of your  device to ensure it is working properly. You will routinely see your Electrophysiologist annually (more often if necessary).

## 2023-02-19 NOTE — Addendum Note (Signed)
Addended by: Douglass Rivers D on: 02/19/2023 03:56 PM   Modules accepted: Level of Service

## 2023-02-19 NOTE — Progress Notes (Signed)
Remote ICD transmission.   

## 2023-02-25 ENCOUNTER — Telehealth: Payer: Self-pay | Admitting: Internal Medicine

## 2023-02-25 NOTE — Telephone Encounter (Signed)
Contacted [name] to schedule their annual wellness visit. Appointment made for 03/06/2023.  Sherol Dade; Care Guide [facility] Direct Dial: [phone]

## 2023-03-06 ENCOUNTER — Ambulatory Visit (INDEPENDENT_AMBULATORY_CARE_PROVIDER_SITE_OTHER): Payer: Medicare Other

## 2023-03-06 VITALS — Ht 64.0 in | Wt 180.0 lb

## 2023-03-06 DIAGNOSIS — Z0000 Encounter for general adult medical examination without abnormal findings: Secondary | ICD-10-CM | POA: Diagnosis not present

## 2023-03-06 NOTE — Patient Instructions (Signed)
[name] , Thank you for taking time to come for your Medicare Wellness Visit. I appreciate your ongoing commitment to your health goals. Please review the following plan we discussed and let me know if I can assist you in the future.   These are the goals we discussed:  Goals      Maintain current health     Patient Stated     Stay healthy, lose weight        This is a list of the screening recommended for you and due dates:  Health Maintenance  Topic Date Due   Zoster (Shingles) Vaccine (1 of 2) Never done   COVID-19 Vaccine (4 - 2023-24 season) 07/25/2022   Flu Shot  06/25/2023   Medicare Annual Wellness Visit  03/05/2024   Colon Cancer Screening  06/28/2027   DTaP/Tdap/Td vaccine (4 - Td or Tdap) 09/15/2032   Pneumonia Vaccine  Completed   DEXA scan (bone density measurement)  Completed   Hepatitis C Screening: USPSTF Recommendation to screen - Ages 52-79 yo.  Completed   HPV Vaccine  Aged Out    Advanced directives:  Will pick up information from office for completion   Conditions/risks identified :   Keep up the good work  Next appointment: Follow up in one year for your annual wellness visit    Preventive Care 65 Years and Older, Female Preventive care refers to lifestyle choices and visits with your health care provider that can promote health and wellness. What does preventive care include? A yearly physical exam. This is also called an annual well check. Dental exams once or twice a year. Routine eye exams. Ask your health care provider how often you should have your eyes checked. Personal lifestyle choices, including: Daily care of your teeth and gums. Regular physical activity. Eating a healthy diet. Avoiding tobacco and drug use. Limiting alcohol use. Practicing safe sex. Taking low-dose aspirin every day. Taking vitamin and mineral supplements as recommended by your health care provider. What happens during an annual well check? The services and  screenings done by your health care provider during your annual well check will depend on your age, overall health, lifestyle risk factors, and family history of disease. Counseling  Your health care provider may ask you questions about your: Alcohol use. Tobacco use. Drug use. Emotional well-being. Home and relationship well-being. Sexual activity. Eating habits. History of falls. Memory and ability to understand (cognition). Work and work Astronomer. Reproductive health. Screening  You may have the following tests or measurements: Height, weight, and BMI. Blood pressure. Lipid and cholesterol levels. These may be checked every 5 years, or more frequently if you are over 52 years old. Skin check. Lung cancer screening. You may have this screening every year starting at age 27 if you have a 30-pack-year history of smoking and currently smoke or have quit within the past 15 years. Fecal occult blood test (FOBT) of the stool. You may have this test every year starting at age 52. Flexible sigmoidoscopy or colonoscopy. You may have a sigmoidoscopy every 5 years or a colonoscopy every 10 years starting at age 32. Hepatitis C blood test. Hepatitis B blood test. Sexually transmitted disease (STD) testing. Diabetes screening. This is done by checking your blood sugar (glucose) after you have not eaten for a while (fasting). You may have this done every 1-3 years. Bone density scan. This is done to screen for osteoporosis. You may have this done starting at age 37. Mammogram. This may  be done every 1-2 years. Talk to your health care provider about how often you should have regular mammograms. Talk with your health care provider about your test results, treatment options, and if necessary, the need for more tests. Vaccines  Your health care provider may recommend certain vaccines, such as: Influenza vaccine. This is recommended every year. Tetanus, diphtheria, and acellular pertussis (Tdap,  Td) vaccine. You may need a Td booster every 10 years. Zoster vaccine. You may need this after age 97. Pneumococcal 13-valent conjugate (PCV13) vaccine. One dose is recommended after age 50. Pneumococcal polysaccharide (PPSV23) vaccine. One dose is recommended after age 31. Talk to your health care provider about which screenings and vaccines you need and how often you need them. This information is not intended to replace advice given to you by your health care provider. Make sure you discuss any questions you have with your health care provider. Document Released: 12/07/2015 Document Revised: 07/30/2016 Document Reviewed: 09/11/2015 Elsevier Interactive Patient Education  2017 [employer] Prevention in the Home Falls can cause injuries. They can happen to people of all ages. There are many things you can do to make your home safe and to help prevent falls. What can I do on the outside of my home? Regularly fix the edges of walkways and driveways and fix any cracks. Remove anything that might make you trip as you walk through a door, such as a raised step or threshold. Trim any bushes or trees on the path to your home. Use bright outdoor lighting. Clear any walking paths of anything that might make someone trip, such as rocks or tools. Regularly check to see if handrails are loose or broken. Make sure that both sides of any steps have handrails. Any raised decks and porches should have guardrails on the edges. Have any leaves, snow, or ice cleared regularly. Use sand or salt on walking paths during winter. Clean up any spills in your garage right away. This includes oil or grease spills. What can I do in the bathroom? Use night lights. Install grab bars by the toilet and in the tub and shower. Do not use towel bars as grab bars. Use non-skid mats or decals in the tub or shower. If you need to sit down in the shower, use a plastic, non-slip stool. Keep the floor dry. Clean up any  water that spills on the floor as soon as it happens. Remove soap buildup in the tub or shower regularly. Attach bath mats securely with double-sided non-slip rug tape. Do not have throw rugs and other things on the floor that can make you trip. What can I do in the bedroom? Use night lights. Make sure that you have a light by your bed that is easy to reach. Do not use any sheets or blankets that are too big for your bed. They should not hang down onto the floor. Have a firm chair that has side arms. You can use this for support while you get dressed. Do not have throw rugs and other things on the floor that can make you trip. What can I do in the kitchen? Clean up any spills right away. Avoid walking on wet floors. Keep items that you use a lot in easy-to-reach places. If you need to reach something above you, use a strong step stool that has a grab bar. Keep electrical cords out of the way. Do not use floor polish or wax that makes floors slippery. If you must use  wax, use non-skid floor wax. Do not have throw rugs and other things on the floor that can make you trip. What can I do with my stairs? Do not leave any items on the stairs. Make sure that there are handrails on both sides of the stairs and use them. Fix handrails that are broken or loose. Make sure that handrails are as long as the stairways. Check any carpeting to make sure that it is firmly attached to the stairs. Fix any carpet that is loose or worn. Avoid having throw rugs at the top or bottom of the stairs. If you do have throw rugs, attach them to the floor with carpet tape. Make sure that you have a light switch at the top of the stairs and the bottom of the stairs. If you do not have them, ask someone to add them for you. What else can I do to help prevent falls? Wear shoes that: Do not have high heels. Have rubber bottoms. Are comfortable and fit you well. Are closed at the toe. Do not wear sandals. If you use a  stepladder: Make sure that it is fully opened. Do not climb a closed stepladder. Make sure that both sides of the stepladder are locked into place. Ask someone to hold it for you, if possible. Clearly mark and make sure that you can see: Any grab bars or handrails. First and last steps. Where the edge of each step is. Use tools that help you move around (mobility aids) if they are needed. These include: Canes. Walkers. Scooters. Crutches. Turn on the lights when you go into a dark area. Replace any light bulbs as soon as they burn out. Set up your furniture so you have a clear path. Avoid moving your furniture around. If any of your floors are uneven, fix them. If there are any pets around you, be aware of where they are. Review your medicines with your doctor. Some medicines can make you feel dizzy. This can increase your chance of falling. Ask your doctor what other things that you can do to help prevent falls. This information is not intended to replace advice given to you by your health care provider. Make sure you discuss any questions you have with your health care provider. Document Released: 09/06/2009 Document Revised: 04/17/2016 Document Reviewed: 12/15/2014 Elsevier Interactive Patient Education  2017 [employer].

## 2023-03-06 NOTE — Progress Notes (Signed)
Subjective:   [name] is a 72 y.o. female who presents for Medicare Annual (Subsequent) preventive examination.   I connected with  [name] on 03/06/23 by a audio enabled telemedicine application and verified that I am speaking with the correct person using two identifiers.  Patient Location: Home  Provider Location: Home Office  I discussed the limitations of evaluation and management by telemedicine. The patient expressed understanding and agreed to proceed.     Review of Systems     Cardiac Risk Factors include: advanced age (>45men, >71 women);dyslipidemia;obesity (BMI >30kg/m2);hypertension     Objective:    Today's Vitals   03/06/23 0947  Weight: 180 lb (81.6 kg)  Height:  (1.626 m)   Body mass index is 30.9 kg/m.     03/06/2023   10:00 AM 02/06/2023   10:09 AM 08/03/2021   10:54 AM 06/02/2019    2:06 PM 11/20/2015    8:06 PM 10/06/2013    9:55 AM 02/04/2013   10:02 AM  Advanced Directives  Does Patient Have a Medical Advance Directive? No No No No No Patient does not have advance directive Patient does not have advance directive  Would patient like information on creating a medical advance directive? No - Patient declined Yes (MAU/Ambulatory/Procedural Areas - Information given) No - Patient declined Yes (MAU/Ambulatory/Procedural Areas - Information given) Yes - Educational materials given    Pre-existing out of facility DNR order (yellow form or pink MOST form)      No No    Current Medications (verified) Outpatient Encounter Medications as of 03/06/2023  Medication Sig   atorvastatin (LIPITOR) 40 MG tablet Take 1 tablet (40 mg total) by mouth daily.   benzonatate (TESSALON) 200 MG capsule Take 1 capsule (200 mg total) by mouth at bedtime as needed for cough.   carvedilol (COREG) 12.5 MG tablet Take 1 tablet by mouth in the morning and take 1 tablet at night   cholecalciferol 25 MCG (1000 UT) tablet Take 1,000 Units by mouth daily.    clopidogrel (PLAVIX) 75 MG tablet TAKE ONE TABLET BY MOUTH ONE TIME DAILY   fexofenadine (ALLEGRA ALLERGY) 60 MG tablet Take 1 tablet (60 mg total) by mouth 2 (two) times daily.   furosemide (LASIX) 40 MG tablet Take 2 tablets (80 mg total) by mouth in the morning AND 1 tablet (40 mg total) every evening. Take  every morning. May take an additional  in the evening as needed.. (Patient taking differently: Take 2 tablets (80 mg total) by mouth in the morning AND 1 tablet (40 mg total) every evening, as needed.Marland Kitchen)   gabapentin (NEURONTIN) 300 MG capsule Take 1 capsule (300 mg total) by mouth daily as needed. (Patient taking differently: Take 300 mg by mouth daily as needed (flair up).)   Multiple Vitamin (MULTIVITAMIN) capsule Take 1 capsule by mouth daily. Focus factor   Multiple Vitamins-Minerals (ICAPS AREDS 2 PO) Take 1 tablet by mouth daily.   Omega 3 1000 MG CAPS Take 1,000 mg by mouth daily.   pantoprazole (PROTONIX) 40 MG tablet Take 1 tablet (40 mg total) by mouth daily.   Polyvinyl Alcohol-Povidone PF (REFRESH) 1.4-0.6 % SOLN Place 1 drop into both eyes daily as needed (Dry eyes).   potassium chloride (KLOR-CON M) 10 MEQ tablet TAKE 3 TABLETS BY MOUTH ONCE A DAY   sacubitril-valsartan (ENTRESTO) 49-51 MG Take 1 tablet by mouth 2 (two) times daily.   sertraline (ZOLOFT) 25 MG tablet Take 1 tablet (25  mg total) by mouth daily.   spironolactone (ALDACTONE) 25 MG tablet Take 1 tablet (25 mg total) by mouth daily.   TURMERIC PO Take 1,000 mg by mouth 2 (two) times daily.   Vitamin D, Ergocalciferol, (DRISDOL) 1.25 MG (50000 UNIT) CAPS capsule Take 1 capsule (50,000 Units total) by mouth every 7 (seven) days.   zinc gluconate 50 MG tablet Take 50 mg by mouth daily.   zolpidem (AMBIEN) 10 MG tablet take one-half to one tablet by mouth daily at bedtime as needed for sleep (Patient taking differently: Take 10 mg by mouth at bedtime as needed for sleep.)   amoxicillin (AMOXIL) 500 MG capsule  Take 4 capsules 30 minutes before dental proedure. (Patient not taking: Reported on 02/09/2023)   meclizine (ANTIVERT) 12.5 MG tablet Take 1-2 tablets (12.5-25 mg total) by mouth 3 (three) times daily as needed for dizziness (Motion sickness). (Patient not taking: Reported on 03/06/2023)   tiZANidine (ZANAFLEX) 2 MG tablet Take 1 tablet (2 mg total) by mouth every 8 (eight) hours as needed for muscle spasms. (Patient not taking: Reported on 02/09/2023)   No facility-administered encounter medications on file as of 03/06/2023.    Allergies (verified) Atacand [candesartan], Keflex [cephalexin], and Latex   History: Past Medical History:  Diagnosis Date   Allergy    Arthritis    Breast CA    twice first on L breast 1982 w/ chest wall involvment, then a second breast ca on the R in 1987   Chronic systolic CHF (congestive heart failure)    a. due to Adriamycin,s/p ICD;  b. 01/2013 Gen change and new LV lead - BSX Energen CRT-D BiV ICD, Ser # [id number]   Depression    Diverticulitis    Early menopause    early 30s   Fatigue 02/20/2016   Glaucoma suspect of both eyes    Dr. Wynell Balloon   Heart murmur    Hyperlipidemia    Nonischemic cardiomyopathy    OSA (obstructive sleep apnea) 02/07/2016   Osteopenia    Renal calculus    Snoring 02/20/2016   TIA (transient ischemic attack)    Past Surgical History:  Procedure Laterality Date   BI-VENTRICULAR IMPLANTABLE CARDIOVERTER DEFIBRILLATOR UPGRADE N/A 02/04/2013   Procedure: BI-VENTRICULAR IMPLANTABLE CARDIOVERTER DEFIBRILLATOR UPGRADE;  Surgeon: Marinus Maw, MD;  Location: [facility] CATH LAB;  Service: Cardiovascular;  Laterality: N/A;   BIV ICD GENERATOR CHANGEOUT N/A 02/06/2023   Procedure: BIV ICD GENERATOR CHANGEOUT;  Surgeon: Duke Salvia, MD;  Location: [facility] INVASIVE CV LAB;  Service: Cardiovascular;  Laterality: N/A;   CARDIAC DEFIBRILLATOR PLACEMENT     AICD Replaced-5/09 and 2014   LEFT AND RIGHT HEART CATHETERIZATION WITH CORONARY  ANGIOGRAM N/A 10/06/2013   Procedure: LEFT AND RIGHT HEART CATHETERIZATION WITH CORONARY ANGIOGRAM;  Surgeon: Dolores Patty, MD;  Location: [facility] CATH LAB;  Service: Cardiovascular;  Laterality: N/A;   LITHOTRIPSY     h/o several procedures    MASTECTOMY Bilateral    OOPHORECTOMY Bilateral    triger finger, R ring finger Right 02/2019   VENOGRAM N/A 10/20/2012   Procedure: VENOGRAM;  Surgeon: Duke Salvia, MD;  Location: [facility] CATH LAB;  Service: Cardiovascular;  Laterality: N/A;   Family History  Problem Relation Age of Onset   Breast cancer Mother        M and sister    Lung cancer Father    Breast cancer Sister    Kidney cancer Brother    Breast cancer Maternal Grandmother  Heart attack Neg Hx    Diabetes Neg Hx    Colon cancer Neg Hx    Esophageal cancer Neg Hx    Stomach cancer Neg Hx    Social History   Socioeconomic History   Marital status: Married    Spouse name: Linde Gillis   Number of children: 3   Years of education: Not on file   Highest education level: Not on file  Occupational History   Occupation: RETIRED---pre Surveyor, 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  Tobacco Use   Smoking status: Never   Smokeless tobacco: Never  Vaping Use   Vaping Use: Never used  Substance and Sexual Activity   Alcohol use: Yes    Comment: socially/occassionally   Drug use: No   Sexual activity: Not on file  Other Topics Concern   Not on file  Social History Narrative   Related to Mr and [name], they are my patients as well    Married, 3 children all in [place], 8 Gkids     Siblings, 2 deceased, 2 living brothers    Social Determinants of Health   Financial Resource Strain: Low Risk  (03/06/2023)   Overall Financial Resource Strain (CARDIA)    Difficulty of Paying Living Expenses: Not hard at all  Food Insecurity: No Food Insecurity (03/06/2023)   Hunger Vital Sign    Worried About Running Out of Food in the Last Year: Never true    Ran Out of  Food in the Last Year: Never true  Transportation Needs: No Transportation Needs (03/06/2023)   PRAPARE - Administrator, Civil Service (Medical): No    Lack of Transportation (Non-Medical): No  Physical Activity: Insufficiently Active (03/06/2023)   Exercise Vital Sign    Days of Exercise per Week: 3 days    Minutes of Exercise per Session: 20 min  Stress: No Stress Concern Present (03/06/2023)   [employer] of Occupational Health - Occupational Stress Questionnaire    Feeling of Stress : Not at all  Social Connections: Socially Integrated (03/06/2023)   Social Connection and Isolation Panel [NHANES]    Frequency of Communication with Friends and Family: More than three times a week    Frequency of Social Gatherings with Friends and Family: More than three times a week    Attends Religious Services: More than 4 times per year    Active Member of [employer] or Organizations: Yes    Attends Engineer, structural: More than 4 times per year    Marital Status: Married    Tobacco Counseling Counseling given: Not Answered   Clinical Intake:  Pre-visit preparation completed: Yes  Pain : No/denies pain     BMI - recorded: 30.9 Nutritional Status: BMI > 30  Obese Nutritional Risks: None Diabetes: No  How often do you need to have someone help you when you read instructions, pamphlets, or other written materials from your doctor or pharmacy?: 1 - Never  Diabetic?  No   Interpreter Needed?: No  Information entered by :: Kandis Cocking, CMA   Activities of Daily Living    03/06/2023   10:01 AM 02/28/2023    8:35 PM  In your present state of health, do you have any difficulty performing the following activities:  Hearing? 0 0  Vision? 0 0  Difficulty concentrating or making decisions? 0 0  Walking or climbing stairs? 0 1  Dressing or bathing? 0 0  Doing errands, shopping?  0 0  Preparing Food and eating ? N N  Using the Toilet? N N  In the past six  months, have you accidently leaked urine? Y Y  Comment sometimes leaky bladder   Do you have problems with loss of bowel control? N N  Managing your Medications? N N  Managing your Finances? N N  Housekeeping or managing your Housekeeping? N N    Patient Care Team: Wanda Plump, MD as PCP - General Duke Salvia, MD as PCP - Electrophysiology (Cardiology) Bensimhon, Bevelyn Buckles, MD as Consulting Physician (Cardiology) Samson Frederic, MD as Consulting Physician (Orthopedic Surgery) Mateo Flow, MD as Consulting Physician (Ophthalmology)  Indicate any recent Medical Services you may have received from other than [facility] providers in the past year (date may be approximate).     Assessment:   This is a routine wellness examination for [name].  Hearing/Vision screen Hearing Screening - Comments:: Denies hearing difficulties   Vision Screening - Comments:: Wears rx glasses - up to date with routine eye exams within last year  Dietary issues and exercise activities discussed:     Goals Addressed             This Visit's Progress    Patient Stated       Stay healthy, lose weight       Depression Screen    03/06/2023   10:50 AM 02/09/2023   10:03 AM 08/04/2022    9:25 AM 01/27/2022    9:57 AM 09/20/2021    4:05 PM 08/03/2021   10:53 AM 01/30/2021    1:44 PM  PHQ 2/9 Scores  PHQ - 2 Score 0 0 0 0 0 0 0  PHQ- 9 Score 0 2 0 0 0  0    Fall Risk    03/06/2023    9:56 AM 02/28/2023    8:35 PM 02/09/2023    9:13 AM 08/04/2022    9:25 AM 01/27/2022    9:57 AM  Fall Risk   Falls in the past year? 1 0 0 1 0  Number falls in past yr: 0 0 0 0 0  Injury with Fall? 0 0 0 0 0  Risk for fall due to : No Fall Risks      Follow up Falls prevention discussed  Falls evaluation completed Falls evaluation completed Falls evaluation completed    FALL RISK PREVENTION PERTAINING TO THE HOME:  Any stairs in or around the home? Yes  If so, are there any without handrails? Yes  Home free of  loose throw rugs in walkways, pet beds, electrical cords, etc? Yes  Adequate lighting in your home to reduce risk of falls? Yes   ASSISTIVE DEVICES UTILIZED TO PREVENT FALLS:  Life alert? Yes  Use of a cane, walker or w/c? No  Grab bars in the bathroom? Yes  Shower chair or bench in shower? Yes  Elevated toilet seat or a handicapped toilet? Yes   TIMED UP AND GO:  Was the test performed? No .  Televisit  Cognitive Function:        03/06/2023   10:02 AM  6CIT Screen  What Year? 0 points  What month? 0 points  What time? 0 points  Count back from 20 0 points  Months in reverse 0 points  Repeat phrase 2 points  Total Score 2 points    Immunizations Immunization History  Administered Date(s) Administered   Fluad Quad(high Dose 65+) 08/02/2019   Influenza Split 11/06/2011, 08/17/2012,  09/15/2022   Influenza Whole 10/01/2007, 08/29/2008, 09/21/2009, 08/12/2010   Influenza, High Dose Seasonal PF 08/05/2017, 09/23/2018   Influenza,inj,Quad PF,6+ Mos 11/10/2013, 09/11/2014   Influenza-Unspecified 09/25/2015, 09/24/2020, 10/01/2021   PFIZER(Purple Top)SARS-COV-2 Vaccination 01/06/2020, 01/29/2020, 10/31/2020   PNEUMOCOCCAL CONJUGATE-20 01/27/2022   Pneumococcal Conjugate-13 09/11/2014   Pneumococcal Polysaccharide-23 03/14/2002, 10/01/2007, 07/02/2016   Td 03/14/2002   Tdap 08/17/2012, 09/15/2022   Zoster, Live 11/19/2012    TDAP status: Up to date  Flu Vaccine status: Completed at today's visit  Pneumococcal vaccine status: Up to date  Covid-19 vaccine status: Completed vaccines  Qualifies for Shingles Vaccine? Yes   Zostavax completed Yes   Shingrix Completed?: No.    Education has been provided regarding the importance of this vaccine. Patient has been advised to call insurance company to determine out of pocket expense if they have not yet received this vaccine. Advised may also receive vaccine at local pharmacy or Health Dept. Verbalized acceptance and  understanding.  Screening Tests Health Maintenance  Topic Date Due   Zoster Vaccines- Shingrix (1 of 2) Never done   COVID-19 Vaccine (4 - 2023-24 season) 07/25/2022   INFLUENZA VACCINE  06/25/2023   Medicare Annual Wellness (AWV)  03/05/2024   COLONOSCOPY (Pts 45-59yrs Insurance coverage will need to be confirmed)  06/28/2027   DTaP/Tdap/Td (4 - Td or Tdap) 09/15/2032   Pneumonia Vaccine 65+ Years old  Completed   DEXA SCAN  Completed   Hepatitis C Screening  Completed   HPV VACCINES  Aged Out    Health Maintenance  Health Maintenance Due  Topic Date Due   Zoster Vaccines- Shingrix (1 of 2) Never done   COVID-19 Vaccine (4 - 2023-24 season) 07/25/2022    Colorectal cancer screening: Type of screening: Colonoscopy. Completed 06/27/2022. Repeat every 5 years  Mammogram status: No longer required due to age.  Bone Density status: Completed yes. Results reflect: Bone density results: OSTEOPENIA. Repeat every 2 years.  Lung Cancer Screening: (Low Dose CT Chest recommended if Age 10-80 years, 30 pack-year currently smoking OR have quit w/in 15years.) does not qualify.   Lung Cancer Screening Referral: N/A  Additional Screening:  Hepatitis C Screening: does qualify; Completed 01/23/2017   Vision Screening: Recommended annual ophthalmology exams for early detection of glaucoma and other disorders of the eye. Is the patient up to date with their annual eye exam?  Yes  Who is the provider or what is the name of the office in which the patient attends annual eye exams? Dr. Shirlyn Goltz If pt is not established with a provider, would they like to be referred to a provider to establish care? No .   Dental Screening: Recommended annual dental exams for proper oral hygiene  Community Resource Referral / Chronic Care Management: CRR required this visit?  No   CCM required this visit?  No      Plan:     I have personally reviewed and noted the following in the patient's chart:    Medical and social history Use of alcohol, tobacco or illicit drugs  Current medications and supplements including opioid prescriptions. Patient is not currently taking opioid prescriptions. Functional ability and status Nutritional status Physical activity Advanced directives List of other physicians Hospitalizations, surgeries, and ER visits in previous 12 months Vitals Screenings to include cognitive, depression, and falls Referrals and appointments  In addition, I have reviewed and discussed with patient certain preventive protocols, quality metrics, and best practice recommendations. A written personalized care plan for preventive services as  well as general preventive health recommendations were provided to patient.     Milus Mallick, CMA   03/06/2023   Nurse Notes: None

## 2023-03-17 NOTE — Addendum Note (Signed)
Addended by: Elease Etienne A on: 03/17/2023 10:08 AM   Modules accepted: Level of Service

## 2023-03-17 NOTE — Progress Notes (Signed)
Remote ICD transmission.   

## 2023-04-21 ENCOUNTER — Telehealth: Payer: Self-pay | Admitting: Internal Medicine

## 2023-04-21 NOTE — Telephone Encounter (Signed)
Requesting: Ambien 10mg  Contract: 02/13/23 UDS: Ambien only Last Visit: 02/09/23 Next Visit: 08/17/23 Last Refill: 12/23/22 #90 and 0RF   Please Advise

## 2023-04-21 NOTE — Telephone Encounter (Signed)
PDMP okay, Rx sent 

## 2023-04-27 ENCOUNTER — Other Ambulatory Visit (HOSPITAL_COMMUNITY): Payer: Self-pay | Admitting: Cardiology

## 2023-04-27 MED ORDER — CLOPIDOGREL BISULFATE 75 MG PO TABS: 75.0000 mg | ORAL_TABLET | Freq: Every day | ORAL | 3 refills | Status: DC

## 2023-05-11 ENCOUNTER — Ambulatory Visit (INDEPENDENT_AMBULATORY_CARE_PROVIDER_SITE_OTHER): Payer: Medicare Other

## 2023-05-11 DIAGNOSIS — I428 Other cardiomyopathies: Secondary | ICD-10-CM | POA: Diagnosis not present

## 2023-05-12 ENCOUNTER — Encounter: Payer: Self-pay | Admitting: Internal Medicine

## 2023-05-12 ENCOUNTER — Ambulatory Visit: Payer: Medicare Other | Attending: Internal Medicine | Admitting: Internal Medicine

## 2023-05-12 VITALS — BP 100/56 | HR 63 | Ht 64.0 in | Wt 178.0 lb

## 2023-05-12 DIAGNOSIS — I4729 Other ventricular tachycardia: Secondary | ICD-10-CM

## 2023-05-12 DIAGNOSIS — I5022 Chronic systolic (congestive) heart failure: Secondary | ICD-10-CM

## 2023-05-12 LAB — CUP PACEART INCLINIC DEVICE CHECK
Date Time Interrogation Session: 20240618170011
HighPow Impedance: 51 Ohm
Implantable Lead Connection Status: 753985
Implantable Lead Connection Status: 753985
Implantable Lead Connection Status: 753985
Implantable Lead Implant Date: 20050701
Implantable Lead Implant Date: 20050701
Implantable Lead Implant Date: 20140314
Implantable Lead Location: 753858
Implantable Lead Location: 753859
Implantable Lead Location: 753860
Implantable Lead Model: 158
Implantable Lead Model: 4196
Implantable Lead Model: 5076
Implantable Lead Serial Number: 156416
Implantable Pulse Generator Implant Date: 20240315
Lead Channel Impedance Value: 456 Ohm
Lead Channel Impedance Value: 493 Ohm
Lead Channel Impedance Value: 605 Ohm
Lead Channel Pacing Threshold Amplitude: 0.7 V
Lead Channel Pacing Threshold Amplitude: 0.9 V
Lead Channel Pacing Threshold Amplitude: 1.2 V
Lead Channel Pacing Threshold Pulse Width: 0.4 ms
Lead Channel Pacing Threshold Pulse Width: 0.4 ms
Lead Channel Pacing Threshold Pulse Width: 0.6 ms
Lead Channel Sensing Intrinsic Amplitude: 10.8 mV
Lead Channel Sensing Intrinsic Amplitude: 17.2 mV
Lead Channel Sensing Intrinsic Amplitude: 3.3 mV
Lead Channel Setting Pacing Amplitude: 2 V
Lead Channel Setting Pacing Amplitude: 2 V
Lead Channel Setting Pacing Amplitude: 2 V
Lead Channel Setting Pacing Pulse Width: 0.4 ms
Lead Channel Setting Pacing Pulse Width: 0.6 ms
Lead Channel Setting Sensing Sensitivity: 0.6 mV
Lead Channel Setting Sensing Sensitivity: 1 mV
Pulse Gen Serial Number: 159739

## 2023-05-12 LAB — CUP PACEART REMOTE DEVICE CHECK
Battery Remaining Longevity: 132 mo
Battery Remaining Percentage: 100 %
Brady Statistic RA Percent Paced: 0 %
Brady Statistic RV Percent Paced: 100 %
Date Time Interrogation Session: 20240617011500
HighPow Impedance: 51 Ohm
Implantable Lead Connection Status: 753985
Implantable Lead Connection Status: 753985
Implantable Lead Connection Status: 753985
Implantable Lead Implant Date: 20050701
Implantable Lead Implant Date: 20050701
Implantable Lead Implant Date: 20140314
Implantable Lead Location: 753858
Implantable Lead Location: 753859
Implantable Lead Location: 753860
Implantable Lead Model: 158
Implantable Lead Model: 4196
Implantable Lead Model: 5076
Implantable Lead Serial Number: 156416
Implantable Pulse Generator Implant Date: 20240315
Lead Channel Impedance Value: 467 Ohm
Lead Channel Impedance Value: 487 Ohm
Lead Channel Impedance Value: 630 Ohm
Lead Channel Pacing Threshold Amplitude: 0.5 V
Lead Channel Pacing Threshold Amplitude: 1 V
Lead Channel Pacing Threshold Pulse Width: 0.4 ms
Lead Channel Pacing Threshold Pulse Width: 0.4 ms
Lead Channel Setting Pacing Amplitude: 2 V
Lead Channel Setting Pacing Amplitude: 2 V
Lead Channel Setting Pacing Amplitude: 2 V
Lead Channel Setting Pacing Pulse Width: 0.4 ms
Lead Channel Setting Pacing Pulse Width: 0.6 ms
Lead Channel Setting Sensing Sensitivity: 0.6 mV
Lead Channel Setting Sensing Sensitivity: 1 mV
Pulse Gen Serial Number: 159739

## 2023-05-12 MED ORDER — ENTRESTO 24-26 MG PO TABS: 1.0000 | ORAL_TABLET | Freq: Two times a day (BID) | ORAL | 3 refills | Status: DC

## 2023-05-12 NOTE — Patient Instructions (Signed)
Medication Instructions:  Your physician has recommended you make the following change in your medication:  1) DECREASE Entresto to 24-26 mg twice daily  *If you need a refill on your cardiac medications before your next appointment, please call your pharmacy*  Follow-Up: At [facility], you and your health needs are our priority.  As part of our continuing mission to provide you with exceptional heart care, we have created designated Provider Care Teams.  These Care Teams include your primary Cardiologist (physician) and Advanced Practice Providers (APPs -  Physician Assistants and Nurse Practitioners) who all work together to provide you with the care you need, when you need it.  Your next appointment:   1 year  Provider:   You may see Sherryl Manges, MD or one of the following Advanced Practice Providers on your designated Care Team:   Francis Dowse, [place] "Mardelle Matte" [place], [place] Sherie Don, NP

## 2023-05-12 NOTE — Progress Notes (Signed)
After watching   seen    Patient Care Team: Wanda Plump, MD as PCP - General Duke Salvia, MD as PCP - Electrophysiology (Cardiology) Bensimhon, Bevelyn Buckles, MD as Consulting Physician (Cardiology) Samson Frederic, MD as Consulting Physician (Orthopedic Surgery) Mateo Flow, MD as Consulting Physician (Ophthalmology)   HPI  [name] is a 72 y.o. female seen in followup for congestive heart failure in the setting of chemotherapy associated cardiomyopathy with previously implanted CRT originally as part of the [facility] CRT protocol May 23, 2004 ; her LV lead had failed and was plugged. 2014 underwent CRT upgrade.    She has a history of a TIA.  9/18 aspirin was decreased to 81 and Plavix was added.  The patient denies chest pain,  , nocturnal dyspnea, orthopnea or peripheral edema.  There have been no palpitations  or syncope.  Lightheadedness frequent with standing.  Dyspnea on exertion moderate as well as with heat.  Her twin brother and nephew both died 05-23-2020- devastating involved in caring for her great-grandson  DATE TEST EF   May 23, 2008 LHC  No angiogr CAD  2008-05-23 Echo  20%   11/14 Echo   10-15 %   7/15 Echo   10-15 % Severe RV dysfn  9/17 Echo  20-25% AI mod MR severe  6/18 Echo  20-25% MR mild AI mod  12/19 Echo  20/25% MR severe  eccentric  6/21 Echo  40-45%  MR-mild  9/22 Echo  40-45% MR mod      Date Cr K Dig  3/18  0.91 4.4 0.7  12/18 0.96 4.2 0.3 (6/18)  7/20 0.8 4.5 0.5  3/22 0.76 3.8   9/23 0.93 4.4 12.7(3/23)   Date LDL  8/17 118  5/20  60     Past Medical History:  Diagnosis Date   Allergy    Arthritis    Breast CA (HCC)    twice first on L breast 1981/05/23 w/ chest wall involvment, then a second breast ca on the R in 05/23/86   Chronic systolic CHF (congestive heart failure) (HCC)    a. due to Adriamycin,s/p ICD;  b. 01/2013 Gen change and new LV lead - BSX [employer] CRT-D BiV ICD, Ser # [id number]   Depression    Diverticulitis    Early menopause    early 30s    Fatigue 02/20/2016   Glaucoma suspect of both eyes    Dr. Wynell Balloon   Heart murmur    Hyperlipidemia    Nonischemic cardiomyopathy (HCC)    OSA (obstructive sleep apnea) 02/07/2016   Osteopenia    Renal calculus    Snoring 02/20/2016   TIA (transient ischemic attack)     Past Surgical History:  Procedure Laterality Date   BI-VENTRICULAR IMPLANTABLE CARDIOVERTER DEFIBRILLATOR UPGRADE N/A 02/04/2013   Procedure: BI-VENTRICULAR IMPLANTABLE CARDIOVERTER DEFIBRILLATOR UPGRADE;  Surgeon: Marinus Maw, MD;  Location: [facility] CATH LAB;  Service: Cardiovascular;  Laterality: N/A;   BIV ICD GENERATOR CHANGEOUT N/A 02/06/2023   Procedure: BIV ICD GENERATOR CHANGEOUT;  Surgeon: Duke Salvia, MD;  Location: [facility] INVASIVE CV LAB;  Service: Cardiovascular;  Laterality: N/A;   CARDIAC DEFIBRILLATOR PLACEMENT     AICD Replaced-5/09 and 05/23/13   LEFT AND RIGHT HEART CATHETERIZATION WITH CORONARY ANGIOGRAM N/A 10/06/2013   Procedure: LEFT AND RIGHT HEART CATHETERIZATION WITH CORONARY ANGIOGRAM;  Surgeon: Dolores Patty, MD;  Location: [facility] CATH LAB;  Service: Cardiovascular;  Laterality: N/A;   LITHOTRIPSY     h/o several procedures  MASTECTOMY Bilateral    OOPHORECTOMY Bilateral    triger finger, R ring finger Right 02/2019   VENOGRAM N/A 10/20/2012   Procedure: VENOGRAM;  Surgeon: Duke Salvia, MD;  Location: [facility] CATH LAB;  Service: Cardiovascular;  Laterality: N/A;    Current Outpatient Medications  Medication Sig Dispense Refill   amoxicillin (AMOXIL) 500 MG capsule Take 4 capsules 30 minutes before dental proedure. 4 capsule 3   atorvastatin (LIPITOR) 40 MG tablet Take 1 tablet (40 mg total) by mouth daily. 90 tablet 1   benzonatate (TESSALON) 200 MG capsule Take 1 capsule (200 mg total) by mouth at bedtime as needed for cough. 30 capsule 1   carvedilol (COREG) 12.5 MG tablet Take 1 tablet by mouth in the morning and take 1 tablet at night 180 tablet 3   cholecalciferol 25 MCG  (1000 UT) tablet Take 1,000 Units by mouth daily.     clopidogrel (PLAVIX) 75 MG tablet Take 1 tablet (75 mg total) by mouth daily. 90 tablet 3   fexofenadine (ALLEGRA ALLERGY) 60 MG tablet Take 1 tablet (60 mg total) by mouth 2 (two) times daily.     furosemide (LASIX) 40 MG tablet Take 2 tablets (80 mg total) by mouth in the morning AND 1 tablet (40 mg total) every evening. Take 40mg  every morning. May take an additional 20mg  in the evening as needed.. (Patient taking differently: Take 2 tablets (80 mg total) by mouth in the morning AND 1 tablet (40 mg total) every evening, as needed.Marland Kitchen) 270 tablet 3   gabapentin (NEURONTIN) 300 MG capsule Take 1 capsule (300 mg total) by mouth daily as needed. (Patient taking differently: Take 300 mg by mouth daily as needed (flair up).) 90 capsule 1   Multiple Vitamin (MULTIVITAMIN) capsule Take 1 capsule by mouth daily. Focus factor     Multiple Vitamins-Minerals (ICAPS AREDS 2 PO) Take 1 tablet by mouth daily.     Omega 3 1000 MG CAPS Take 1,000 mg by mouth daily.     pantoprazole (PROTONIX) 40 MG tablet Take 1 tablet (40 mg total) by mouth daily. 90 tablet 1   Polyvinyl Alcohol-Povidone PF (REFRESH) 1.4-0.6 % SOLN Place 1 drop into both eyes daily as needed (Dry eyes).     potassium chloride (KLOR-CON M) 10 MEQ tablet TAKE 3 TABLETS BY MOUTH ONCE A DAY 270 tablet 0   sacubitril-valsartan (ENTRESTO) 49-51 MG Take 1 tablet by mouth 2 (two) times daily. 180 tablet 3   sertraline (ZOLOFT) 25 MG tablet Take 1 tablet (25 mg total) by mouth daily. 90 tablet 2   spironolactone (ALDACTONE) 25 MG tablet Take 1 tablet (25 mg total) by mouth daily. 90 tablet 1   tiZANidine (ZANAFLEX) 2 MG tablet Take 1 tablet (2 mg total) by mouth every 8 (eight) hours as needed for muscle spasms. 21 tablet 0   TURMERIC PO Take 1,000 mg by mouth 2 (two) times daily.     zinc gluconate 50 MG tablet Take 50 mg by mouth daily.     zolpidem (AMBIEN) 10 MG tablet TAKE ONE-HALF TO ONE TABLET BY  MOUTH DAILY AT BEDTIME AS NEEDED FOR SLEEP 90 tablet 0   No current facility-administered medications for this visit.    Allergies  Allergen Reactions   Atacand [Candesartan] Other (See Comments)    Causes fatigue    Keflex [Cephalexin] Hives and Itching   Latex Rash    Review of Systems negative except from HPI and PMH  Physical Exam BP Marland Kitchen)  100/56   Pulse 63   Ht 5\' 4"  (1.626 m)   Wt 178 lb (80.7 kg)   SpO2 94%   BMI 30.55 kg/m  Well developed and well nourished in no acute distress HENT normal Neck supple with JVP-flat Clear Device pocket well healed; without hematoma or erythema.  There is no tethering  Regular rate and rhythm, no. gallop 2/6 early murmur Abd-soft with active BS No Clubbing cyanosis  edema Skin-warm and dry A & Oriented  Grossly normal sensory and motor function  ECG sinus with P synchronous pacing with an upright QRS lead V1 and negative QRS lead I device function is abnormal with recurrent PMT. Programming changes lengthened PVARP from 240--340 See Paceart for details     Assessment and  Plan  Nonischemic cardiomyopathy/MR-   Congestive heart failure chronic systolic  Ventricular tachycardia-nonsustained  Hyperlipidemia  TIA  Implantable defibrillator-[employer]-CRT  .   No interval ventricular tachycardia.  Continue her beta-blocker.  With her cardiomyopathy we will continue the Entresto and the Aldactone.  With lightheadedness, and hypotension we will decrease her Entresto 49/51--24/26.  Euvolemic.  Continue furosemide and spironolactone

## 2023-05-18 DIAGNOSIS — M65332 Trigger finger, left middle finger: Secondary | ICD-10-CM | POA: Diagnosis not present

## 2023-05-18 DIAGNOSIS — Z4789 Encounter for other orthopedic aftercare: Secondary | ICD-10-CM | POA: Diagnosis not present

## 2023-05-26 ENCOUNTER — Other Ambulatory Visit (HOSPITAL_COMMUNITY): Payer: Self-pay | Admitting: Internal Medicine

## 2023-05-27 ENCOUNTER — Other Ambulatory Visit (HOSPITAL_COMMUNITY): Payer: Self-pay

## 2023-05-27 MED ORDER — CARVEDILOL 12.5 MG PO TABS: ORAL_TABLET | ORAL | 0 refills | Status: DC

## 2023-06-01 DIAGNOSIS — M79642 Pain in left hand: Secondary | ICD-10-CM | POA: Diagnosis not present

## 2023-06-01 DIAGNOSIS — M1711 Unilateral primary osteoarthritis, right knee: Secondary | ICD-10-CM | POA: Diagnosis not present

## 2023-06-02 NOTE — Progress Notes (Signed)
Remote ICD transmission.   

## 2023-06-10 ENCOUNTER — Encounter (HOSPITAL_COMMUNITY): Payer: Medicare Other

## 2023-06-18 ENCOUNTER — Other Ambulatory Visit (HOSPITAL_COMMUNITY): Payer: Self-pay | Admitting: Cardiology

## 2023-06-18 DIAGNOSIS — I5022 Chronic systolic (congestive) heart failure: Secondary | ICD-10-CM

## 2023-06-22 ENCOUNTER — Encounter (HOSPITAL_COMMUNITY): Payer: Self-pay

## 2023-06-22 ENCOUNTER — Ambulatory Visit (HOSPITAL_COMMUNITY)
Admission: RE | Admit: 2023-06-22 | Discharge: 2023-06-22 | Disposition: A | Discharge status: Home / Self Care | Payer: Medicare Other | Source: Ambulatory Visit | Attending: Family Medicine | Admitting: Family Medicine

## 2023-06-22 ENCOUNTER — Ambulatory Visit (HOSPITAL_BASED_OUTPATIENT_CLINIC_OR_DEPARTMENT_OTHER)
Admission: RE | Admit: 2023-06-22 | Discharge: 2023-06-22 | Disposition: A | Discharge status: Home / Self Care | Payer: Medicare Other | Source: Ambulatory Visit | Attending: Family Medicine | Admitting: Family Medicine

## 2023-06-22 VITALS — BP 120/60 | HR 63 | Wt 177.2 lb

## 2023-06-22 DIAGNOSIS — Z9221 Personal history of antineoplastic chemotherapy: Secondary | ICD-10-CM | POA: Diagnosis not present

## 2023-06-22 DIAGNOSIS — Z4502 Encounter for adjustment and management of automatic implantable cardiac defibrillator: Secondary | ICD-10-CM | POA: Insufficient documentation

## 2023-06-22 DIAGNOSIS — G4733 Obstructive sleep apnea (adult) (pediatric): Secondary | ICD-10-CM | POA: Insufficient documentation

## 2023-06-22 DIAGNOSIS — I080 Rheumatic disorders of both mitral and aortic valves: Secondary | ICD-10-CM | POA: Insufficient documentation

## 2023-06-22 DIAGNOSIS — Z95810 Presence of automatic (implantable) cardiac defibrillator: Secondary | ICD-10-CM | POA: Diagnosis not present

## 2023-06-22 DIAGNOSIS — Z853 Personal history of malignant neoplasm of breast: Secondary | ICD-10-CM | POA: Diagnosis not present

## 2023-06-22 DIAGNOSIS — I5022 Chronic systolic (congestive) heart failure: Secondary | ICD-10-CM | POA: Diagnosis not present

## 2023-06-22 DIAGNOSIS — I340 Nonrheumatic mitral (valve) insufficiency: Secondary | ICD-10-CM | POA: Diagnosis not present

## 2023-06-22 DIAGNOSIS — Z8673 Personal history of transient ischemic attack (TIA), and cerebral infarction without residual deficits: Secondary | ICD-10-CM

## 2023-06-22 DIAGNOSIS — I351 Nonrheumatic aortic (valve) insufficiency: Secondary | ICD-10-CM

## 2023-06-22 DIAGNOSIS — Z79899 Other long term (current) drug therapy: Secondary | ICD-10-CM | POA: Insufficient documentation

## 2023-06-22 DIAGNOSIS — Z7902 Long term (current) use of antithrombotics/antiplatelets: Secondary | ICD-10-CM | POA: Insufficient documentation

## 2023-06-22 DIAGNOSIS — I428 Other cardiomyopathies: Secondary | ICD-10-CM | POA: Insufficient documentation

## 2023-06-22 DIAGNOSIS — Z923 Personal history of irradiation: Secondary | ICD-10-CM | POA: Diagnosis not present

## 2023-06-22 DIAGNOSIS — Z8249 Family history of ischemic heart disease and other diseases of the circulatory system: Secondary | ICD-10-CM | POA: Insufficient documentation

## 2023-06-22 LAB — BASIC METABOLIC PANEL
Anion gap: 7 (ref 5–15)
BUN: 12 mg/dL (ref 8–23)
CO2: 26 mmol/L (ref 22–32)
Calcium: 9.5 mg/dL (ref 8.9–10.3)
Chloride: 104 mmol/L (ref 98–111)
Creatinine, Ser: 0.73 mg/dL (ref 0.44–1.00)
GFR, Estimated: >60 mL/min (ref >60)
Glucose, Bld: 109 mg/dL — ABNORMAL HIGH (ref 70–99)
Potassium: 3.9 mmol/L (ref 3.5–5.1)
Sodium: 137 mmol/L (ref 135–145)

## 2023-06-22 LAB — ECHOCARDIOGRAM COMPLETE
AR max vel: 2.02 cm2
AV Area VTI: 2.11 cm2
AV Area mean vel: 1.93 cm2
AV Mean grad: 12 mmHg
AV Peak grad: 22.2 mmHg
Ao pk vel: 2.35 m/s
Area-P 1/2: 2.87 cm2
Calc EF: 48.3 %
P 1/2 time: 368 msec
Single Plane A2C EF: 50.6 %
Single Plane A4C EF: 46.7 %

## 2023-06-22 LAB — BRAIN NATRIURETIC PEPTIDE: B Natriuretic Peptide: 259.2 pg/mL — ABNORMAL HIGH (ref 0.0–100.0)

## 2023-06-22 MED ORDER — POTASSIUM CHLORIDE CRYS ER 10 MEQ PO TBCR: 30.0000 meq | EXTENDED_RELEASE_TABLET | Freq: Every day | ORAL | 3 refills | Status: DC

## 2023-06-22 MED ORDER — CARVEDILOL 12.5 MG PO TABS: ORAL_TABLET | ORAL | 3 refills | Status: DC

## 2023-06-22 MED ORDER — SPIRONOLACTONE 25 MG PO TABS: 25.0000 mg | ORAL_TABLET | Freq: Every day | ORAL | 3 refills | Status: DC

## 2023-06-22 NOTE — Patient Instructions (Signed)
Medication Changes:  No Changes In Medications at this time.   Lab Work:  Labs done today, your results will be available in MyChart, we will contact you for abnormal readings.   Follow-Up in: IN 6 MONTHS PLEASE CALL OUR OFFICE AROUND NOVEMBER TO GET SCHEDULED FOR YOUR APPOINTMENT. PHONE NUMBER IS [phone] OPTION 2    At the [facility], you and your health needs are our priority. We have a designated team specialized in the treatment of Heart Failure. This Care Team includes your primary Heart Failure Specialized Cardiologist (physician), Advanced Practice Providers (APPs- Physician Assistants and Nurse Practitioners), and Pharmacist who all work together to provide you with the care you need, when you need it.   You may see any of the following providers on your designated Care Team at your next follow up:  Dr. Arvilla Meres Dr. Marca Ancona Dr. Marcos Eke, NP Robbie Lis, [place] [facility] [place], [place] Brynda Peon, NP Karle Plumber, PharmD   Please be sure to bring in all your medications bottles to every appointment.   Need to Contact us:  If you have any questions or concerns before your next appointment please send [place] a message through [place] or call our office at [phone].    TO LEAVE A MESSAGE FOR THE NURSE SELECT OPTION 2, PLEASE LEAVE A MESSAGE INCLUDING: YOUR NAME DATE OF BIRTH CALL BACK NUMBER REASON FOR CALL**this is important as we prioritize the call backs  YOU WILL RECEIVE A CALL BACK THE SAME DAY AS LONG AS YOU CALL BEFORE 4:00 PM

## 2023-06-22 NOTE — Progress Notes (Signed)
[facility] NOTE  Patient ID: [name], female   DOB: 06/26/1951, 72 y.o.   MRN: [id number] PCP: Dr Drue Novel EP: Dr Graciela Husbands HF Cardiologist: Dr. Gala Romney  HPI Ms. Commins is a 72 y.o.woman with h/o chronic systolic presumed due to chemotherapy-related cardiomyopathy, left breast cancer in 1982 treated with chemo (including adriamycin)/XRT and L mastectomy. Had R breast cancer (unrelated) in 1987. Has been cancer free since. Denies HTN, HL, DM2.  Was first diagnosed with HF in 1988. Had left heart cath she thinks in 2009. 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 Echo 2009 EF 20% with moderate AI and mild to moderate MR. Had [employer] ICD placed and then LV lead became nonfunctional. In 3/14, underwent CRT revision.   Had sleep study 1/17 very mild OSA (6.2/hr) not using CPAP.   Admitted in 6/18 withTIA. Unable to use right hand and speech slurred. CT normal. Unable to do MRI.   Today she returns for HF follow up.Overall feeling fine. Mild SOB with exertion. Denies PND/Orthopnea. Appetite ok. No fever or chills. Weight at home  has been stable. Asking about weight loss medications. Taking all medications  Today she returns for HF follow up. Overall feeling fine. She has SOB with working in her yard, walking up steps. Occasional dizziness when standing. Denies increasing SOB, CP, dizziness, edema, or PND/Orthopnea. Appetite ok. No fever or chills. Weight at home 177 pounds. Taking all medications.   Echo today 06/22/23, EF appears 45-50% on my read, official MD read pending.  .  Cardiac studies  Echo (11/14) with Dr. Donnie Aho: EF 10-15% with significant dyssynchrony. Subsequently had LV lead checked and was functioning well.  Echo (7/15):  EF 10-15%, moderate to severe RV dysfunction Echo (4/16):EF 15% normal RV moderate AI Echo (9/17):EF 20-25% RV ok. Severe MR mild AI/AS Echo (6/18): EF 20-25% RV ok Mild to moderate AI  Echo (12/19): EF  25% RV ok  Echo (9/22): EF 40-45% Mild AS Mod AI Mod MR   - CPX 2/19 FVC 3.41  (117%)      FEV1 2.45 (108%)        FEV1/FVC 72 (92%)        MVV 78 (89%)      Resting HR: 74 Peak HR: 121   (80% age predicted max HR) BP rest: 108/56 BP peak: 136/58 Peak VO2: 18.3 (103% predicted peak VO2) VE/VCO2 slope:  30 OUES: 2.45 Peak RER: 0.95  - CPX 10/17 FVC 3.44 (113%)      FEV1 2.32 (97%)        FEV1/FVC 67 (86%)        MVV 90 (101%)      Resting HR: 72 Peak HR: 112 (73% age predicted max HR)  BP rest: 104/64 BP peak: 146/64 Peak VO2: 16.0 (91% predicted peak VO2) VE/VCO2 slope:  34.6 OUES: 1.90 Peak RER: 1.10 Ventilatory Threshold: 13.5 (76.9% predicted or measured peak VO2) VE/MVV:  64% PETCO2 at peak:  29 O2pulse:  13   (130% predicted O2pulse)  - LHC/RHC (10/06/13)  RA = 11  RV = 54/9/13  PA = 53/27 (38)  PCW = 23  Fick cardiac output/index = 3.8/2.0  Thermo CO/CI = 3.4/1.8  PVR = 3.9 WU (Fick)  FA sat = 98%  PA sat = 62%, 64%  SVC = 59%  Coronaries no significant disease but there was a fistula from small D1 => PA.   - CPX (10/25/13)  Peak VO2: 15.6 (  81.2% predicted peak VO2) VE/VCO2 slope: 36.9 OUES: 1.25 Peak RER: 1.08  - CPX (05/24/14) FVC 3.05 (92%)  FEV1 2.23 (88%)  FEV1/FVC 73%  MVV 92 (100%) Resting HR: 64 Peak HR: 132 (85% age predicted max HR) BP rest: 108/70 BP peak: 130/66  Peak VO2: 16.4 (81.1% predicted peak VO2) VE/VCO2 slope: 33.6 OUES: 1.74 Peak RER: 1.16 Ventilatory Threshold: 12.5 (61.8% predicted peak VO2) VE/MVV: 62% PETCO2 at peak: 31 O2pulse: 12 (109% predicted O2pulse)  Blood type A+   FHX: Had older brother with HF in his 79s. No strong FHx of HF.   SocHx: Retired. Non-smoker. Rare ETOH. 3 grown kids.   Review of systems complete and found to be negative unless listed in HPI.    Past Medical History:  Diagnosis Date   Allergy    Arthritis    Breast CA (HCC)    twice first on L breast 1982 w/ chest wall involvment, then  a second breast ca on the R in 1987   Chronic systolic CHF (congestive heart failure) (HCC)    a. due to Adriamycin,s/p ICD;  b. 01/2013 Gen change and new LV lead - BSX [employer] CRT-D BiV ICD, Ser # [id number]   Depression    Diverticulitis    Early menopause    early 30s   Fatigue 02/20/2016   Glaucoma suspect of both eyes    Dr. Wynell Balloon   Heart murmur    Hyperlipidemia    Nonischemic cardiomyopathy (HCC)    OSA (obstructive sleep apnea) 02/07/2016   Osteopenia    Renal calculus    Snoring 02/20/2016   TIA (transient ischemic attack)     Past Surgical History:  Procedure Laterality Date   BI-VENTRICULAR IMPLANTABLE CARDIOVERTER DEFIBRILLATOR UPGRADE N/A 02/04/2013   Procedure: BI-VENTRICULAR IMPLANTABLE CARDIOVERTER DEFIBRILLATOR UPGRADE;  Surgeon: Marinus Maw, MD;  Location: [facility] CATH LAB;  Service: Cardiovascular;  Laterality: N/A;   BIV ICD GENERATOR CHANGEOUT N/A 02/06/2023   Procedure: BIV ICD GENERATOR CHANGEOUT;  Surgeon: Duke Salvia, MD;  Location: [facility] INVASIVE CV LAB;  Service: Cardiovascular;  Laterality: N/A;   CARDIAC DEFIBRILLATOR PLACEMENT     AICD Replaced-5/09 and 2014   LEFT AND RIGHT HEART CATHETERIZATION WITH CORONARY ANGIOGRAM N/A 10/06/2013   Procedure: LEFT AND RIGHT HEART CATHETERIZATION WITH CORONARY ANGIOGRAM;  Surgeon: Dolores Patty, MD;  Location: [facility] CATH LAB;  Service: Cardiovascular;  Laterality: N/A;   LITHOTRIPSY     h/o several procedures    MASTECTOMY Bilateral    OOPHORECTOMY Bilateral    triger finger, R ring finger Right 02/2019   VENOGRAM N/A 10/20/2012   Procedure: VENOGRAM;  Surgeon: Duke Salvia, MD;  Location: [facility] CATH LAB;  Service: Cardiovascular;  Laterality: N/A;    Current Outpatient Medications  Medication Sig Dispense Refill   amoxicillin (AMOXIL) 500 MG capsule Take 4 capsules 30 minutes before dental proedure. 4 capsule 3   atorvastatin (LIPITOR) 40 MG tablet Take 1 tablet (40 mg total) by mouth daily. 90  tablet 1   benzonatate (TESSALON) 200 MG capsule Take 1 capsule (200 mg total) by mouth at bedtime as needed for cough. 30 capsule 1   carvedilol (COREG) 12.5 MG tablet Take 1 tablet by mouth in the morning and take 1 tablet at night 180 tablet 0   cholecalciferol 25 MCG (1000 UT) tablet Take 1,000 Units by mouth daily.     clopidogrel (PLAVIX) 75 MG tablet Take 1 tablet (75 mg total) by mouth daily. 90 tablet  3   fexofenadine (ALLEGRA ALLERGY) 60 MG tablet Take 1 tablet (60 mg total) by mouth 2 (two) times daily.     furosemide (LASIX) 40 MG tablet Take 2 tablets (80 mg total) by mouth in the morning AND 1 tablet (40 mg total) every evening. Take 40mg  every morning. May take an additional 20mg  in the evening as needed.. (Patient taking differently: Take 2 tablets (80 mg total) by mouth in the morning AND 1 tablet (40 mg total) every evening, as needed.Marland Kitchen) 270 tablet 3   gabapentin (NEURONTIN) 300 MG capsule Take 1 capsule (300 mg total) by mouth daily as needed. 90 capsule 1   Multiple Vitamin (MULTIVITAMIN) capsule Take 1 capsule by mouth daily. Focus factor     Multiple Vitamins-Minerals (ICAPS AREDS 2 PO) Take 1 tablet by mouth daily.     Omega 3 1000 MG CAPS Take 1,000 mg by mouth daily.     pantoprazole (PROTONIX) 40 MG tablet Take 1 tablet (40 mg total) by mouth daily. 90 tablet 1   Polyvinyl Alcohol-Povidone PF (REFRESH) 1.4-0.6 % SOLN Place 1 drop into both eyes daily as needed (Dry eyes).     potassium chloride (KLOR-CON M) 10 MEQ tablet Take 3 tablets (30 mEq total) by mouth daily. NEEDS FOLLOW UP APPOINTMENT FOR MORE REFILLS 270 tablet 0   sacubitril-valsartan (ENTRESTO) 24-26 MG Take 1 tablet by mouth 2 (two) times daily. 180 tablet 3   sertraline (ZOLOFT) 25 MG tablet Take 1 tablet (25 mg total) by mouth daily. 90 tablet 2   spironolactone (ALDACTONE) 25 MG tablet Take 1 tablet (25 mg total) by mouth daily. 90 tablet 1   tiZANidine (ZANAFLEX) 2 MG tablet Take 1 tablet (2 mg total) by  mouth every 8 (eight) hours as needed for muscle spasms. 21 tablet 0   TURMERIC PO Take 1,000 mg by mouth 2 (two) times daily.     zinc gluconate 50 MG tablet Take 50 mg by mouth daily.     zolpidem (AMBIEN) 10 MG tablet TAKE ONE-HALF TO ONE TABLET BY MOUTH DAILY AT BEDTIME AS NEEDED FOR SLEEP 90 tablet 0   No current facility-administered medications for this encounter.   Allergies  Allergen Reactions   Atacand [Candesartan] Other (See Comments)    Causes fatigue    Dapagliflozin     Details not clear   Keflex [Cephalexin] Hives and Itching   Latex Rash   BP 120/60   Pulse 63   Wt 80.4 kg (177 lb 3.2 oz)   SpO2 98%   BMI 30.42 kg/m   Wt Readings from Last 3 Encounters:  06/22/23 80.4 kg (177 lb 3.2 oz)  05/12/23 80.7 kg (178 lb)  03/06/23 81.6 kg (180 lb)   Physical Exam General:  NAD. No resp difficulty, walked into clinic HEENT: Normal Neck: Supple. No JVD. Carotids 2+ bilat; no bruits. No lymphadenopathy or thryomegaly appreciated. Cor: PMI nondisplaced. Regular rate & rhythm. No rubs, gallops, faint 1-2/6 AS Lungs: Clear Abdomen: Soft, nontender, nondistended. No hepatosplenomegaly. No bruits or masses. Good bowel sounds. Extremities: No cyanosis, clubbing, rash, edema Neuro: Alert & oriented x 3, cranial nerves grossly intact. Moves all 4 extremities w/o difficulty. Affect pleasant.  Device interrogation (personally reviewed): HL score 0, 1.5 hr/day activity, average HR 67 bpm, no AT/AF  Assessment and  Plan 1. Chronic systolic HF:  - Nonischemic cardiomyopathy, possibly anthracycline cardiotoxicity.   - LHC 11/14 without significant coronary disease.  - Has [employer] CRT-D.  - Echo  11.14 EF 10-15%. Echo at [facility] 4/16 EF 15% moderate AI.  - Echo 9/17 EF 20-25%.  RV ok. Severe MR mild AI/AS  Seen in [facility] and will follow prn - Echo 6/18 LVEF 20-25%, Grade 1 DD. - Echo 12/19 EF 20-25% RV ok - Echo 6/21 EF improved to 40-45% - Echo 2022 EF  40-45% Mild AS Moderate AI and moderate functional MR - Echo today, official results pending, but EF appears ~45-50% on my read - NYHA II. Volume status stable.  - Continue Lasix 80/40 + 30 KCL - Did not tolerate Jardiance - Continue Entresto 24/26 mg bid. Recently decreased with orthostasis. - Continue carvedilol 12.5 mg bid. - Continue spironolactone 25 mg daily. - Labs today.  2. Valvular heart disease - Echo 07/2021 mild AS mod AI and moderate functional MR - SBE prophylaxis - Consider TEE in near future if no improvement - Echo today, follow closely  3. h/o bilateral breast CA in 1982 and 1987 (treated with Adriamycin in 1982) - stable.  - No change  4. ICD  - Follows with Dr. Graciela Husbands. - Interrogation as above  5. h/o TIA.  - Continue Plavix and statin.  - No recent Neuro symptoms. No change  Follow up in 6 months with Dr Gala Romney.  Anderson Malta [place], [place] 06/22/2023

## 2023-06-22 NOTE — Progress Notes (Signed)
  Echocardiogram 2D Echocardiogram has been performed.  [name] 06/22/2023, 9:57 AM

## 2023-06-24 ENCOUNTER — Other Ambulatory Visit: Payer: Self-pay | Admitting: Internal Medicine

## 2023-06-30 ENCOUNTER — Encounter: Payer: Self-pay | Admitting: Cardiology

## 2023-06-30 ENCOUNTER — Telehealth (HOSPITAL_COMMUNITY): Payer: Self-pay

## 2023-06-30 DIAGNOSIS — I5022 Chronic systolic (congestive) heart failure: Secondary | ICD-10-CM

## 2023-06-30 DIAGNOSIS — H16223 Keratoconjunctivitis sicca, not specified as Sjogren's, bilateral: Secondary | ICD-10-CM | POA: Diagnosis not present

## 2023-06-30 DIAGNOSIS — H04123 Dry eye syndrome of bilateral lacrimal glands: Secondary | ICD-10-CM | POA: Diagnosis not present

## 2023-06-30 MED ORDER — FUROSEMIDE 40 MG PO TABS: ORAL_TABLET | ORAL | 3 refills | Status: DC

## 2023-06-30 MED ORDER — POTASSIUM CHLORIDE CRYS ER 10 MEQ PO TBCR
40.0000 meq | EXTENDED_RELEASE_TABLET | Freq: Every day | ORAL | 11 refills | Status: DC
Start: 2023-06-30 — End: 2023-09-03

## 2023-06-30 NOTE — Telephone Encounter (Signed)
Patient's labs has been placed and appointment scheduled. In addition, medication has been changed and updated in her chart. Pt aware, agreeable, and verbalized understanding.

## 2023-07-09 ENCOUNTER — Encounter (INDEPENDENT_AMBULATORY_CARE_PROVIDER_SITE_OTHER): Payer: Self-pay

## 2023-07-14 ENCOUNTER — Ambulatory Visit (HOSPITAL_COMMUNITY)
Admission: RE | Admit: 2023-07-14 | Discharge: 2023-07-14 | Disposition: A | Discharge status: Home / Self Care | Payer: Medicare Other | Source: Ambulatory Visit | Attending: Internal Medicine | Admitting: Internal Medicine

## 2023-07-14 DIAGNOSIS — I5022 Chronic systolic (congestive) heart failure: Secondary | ICD-10-CM | POA: Diagnosis not present

## 2023-07-14 LAB — BASIC METABOLIC PANEL
Anion gap: 12 (ref 5–15)
BUN: 15 mg/dL (ref 8–23)
CO2: 23 mmol/L (ref 22–32)
Calcium: 9.5 mg/dL (ref 8.9–10.3)
Chloride: 101 mmol/L (ref 98–111)
Creatinine, Ser: 0.84 mg/dL (ref 0.44–1.00)
GFR, Estimated: >60 mL/min (ref >60)
Glucose, Bld: 135 mg/dL — ABNORMAL HIGH (ref 70–99)
Potassium: 3.7 mmol/L (ref 3.5–5.1)
Sodium: 136 mmol/L (ref 135–145)

## 2023-07-22 ENCOUNTER — Encounter: Payer: Self-pay | Admitting: Physician Assistant

## 2023-07-22 ENCOUNTER — Ambulatory Visit (INDEPENDENT_AMBULATORY_CARE_PROVIDER_SITE_OTHER): Payer: Medicare Other | Admitting: Physician Assistant

## 2023-07-22 VITALS — BP 118/58 | HR 68 | Temp 98.8°F | Resp 20 | Wt 172.6 lb

## 2023-07-22 DIAGNOSIS — L209 Atopic dermatitis, unspecified: Secondary | ICD-10-CM

## 2023-07-22 MED ORDER — TRIAMCINOLONE ACETONIDE 0.1 % EX CREA
1.0000 | TOPICAL_CREAM | Freq: Two times a day (BID) | CUTANEOUS | 0 refills | Status: AC
Start: 2023-07-22 — End: ?

## 2023-07-22 NOTE — Progress Notes (Signed)
Established patient visit   Patient: [name]   DOB: June 24, 1951   72 y.o. Female  MRN: [id number] Visit Date: 07/22/2023  Today's healthcare provider: Alfredia Ferguson, PA-C   Chief Complaint  Patient presents with   Rash    Rash in between breast this episode started about a week ago   Subjective    HPI   Pt reports a red itchy rash on the center of her chest and between her breasts that first appeared several months ago, she has been topical antibiotic cream and in a few weeks it went away.  It is recurred and over the last week has been the same, red and itchy.  No improvement with the antibiotic cream this time.  She has a history of bilateral breast cancer first occurrence 1982/3-second occurrence in 1987, 1983 she had chest wall radiation.  Denies any masses present.  Due to bilateral mastectomy and implant reconstruction and being so far out from her cancer she does not get regular screening.  Medications: Outpatient Medications Prior to Visit  Medication Sig   amoxicillin (AMOXIL) 500 MG capsule Take 4 capsules 30 minutes before dental proedure.   atorvastatin (LIPITOR) 40 MG tablet Take 1 tablet (40 mg total) by mouth daily.   benzonatate (TESSALON) 200 MG capsule Take 1 capsule (200 mg total) by mouth at bedtime as needed for cough.   carvedilol (COREG) 12.5 MG tablet Take 1 tablet by mouth in the morning and take 1 tablet at night   cholecalciferol 25 MCG (1000 UT) tablet Take 1,000 Units by mouth daily.   clopidogrel (PLAVIX) 75 MG tablet Take 1 tablet (75 mg total) by mouth daily.   fexofenadine (ALLEGRA ALLERGY) 60 MG tablet Take 1 tablet (60 mg total) by mouth 2 (two) times daily.   furosemide (LASIX) 40 MG tablet Take 2 tablets (80 mg total) by mouth in the morning AND 1.5 tablets (60 mg total) every evening.   gabapentin (NEURONTIN) 300 MG capsule Take 1 capsule (300 mg total) by mouth daily as needed.   Multiple Vitamin (MULTIVITAMIN) capsule Take 1  capsule by mouth daily. Focus factor   Multiple Vitamins-Minerals (ICAPS AREDS 2 PO) Take 1 tablet by mouth daily.   Omega 3 1000 MG CAPS Take 1,000 mg by mouth daily.   pantoprazole (PROTONIX) 40 MG tablet Take 1 tablet (40 mg total) by mouth daily.   Polyvinyl Alcohol-Povidone PF (REFRESH) 1.4-0.6 % SOLN Place 1 drop into both eyes daily as needed (Dry eyes).   potassium chloride (KLOR-CON M) 10 MEQ tablet Take 4 tablets (40 mEq total) by mouth daily.   sacubitril-valsartan (ENTRESTO) 24-26 MG Take 1 tablet by mouth 2 (two) times daily.   sertraline (ZOLOFT) 25 MG tablet Take 1 tablet (25 mg total) by mouth daily.   spironolactone (ALDACTONE) 25 MG tablet Take 1 tablet (25 mg total) by mouth daily.   tiZANidine (ZANAFLEX) 2 MG tablet Take 1 tablet (2 mg total) by mouth every 8 (eight) hours as needed for muscle spasms.   TURMERIC PO Take 1,000 mg by mouth 2 (two) times daily.   zinc gluconate 50 MG tablet Take 50 mg by mouth daily.   zolpidem (AMBIEN) 10 MG tablet TAKE ONE-HALF TO ONE TABLET BY MOUTH DAILY AT BEDTIME AS NEEDED FOR SLEEP   No facility-administered medications prior to visit.    Review of Systems  Constitutional:  Negative for fatigue and fever.  Respiratory:  Negative for cough and shortness of  breath.   Cardiovascular:  Negative for chest pain and leg swelling.  Gastrointestinal:  Negative for abdominal pain.  Skin:  Positive for rash.  Neurological:  Negative for dizziness and headaches.      Objective    BP (!) 118/58 (BP Location: Left Arm, Patient Position: Sitting, Cuff Size: Normal)   Pulse 68   Temp 98.8 F (37.1 C) (Oral)   Resp 20   Wt 172 lb 9.6 oz (78.3 kg)   SpO2 98%   BMI 29.63 kg/m   Physical Exam Vitals reviewed.  Constitutional:      Appearance: She is not ill-appearing.  HENT:     Head: Normocephalic.  Eyes:     Conjunctiva/sclera: Conjunctivae normal.  Cardiovascular:     Rate and Rhythm: Normal rate.  Pulmonary:     Effort:  Pulmonary effort is normal. No respiratory distress.  Skin:    Comments: Erythematous very small papular rash center of chest in between breasts.  Does not spread  to breast tissue. No masses present underneath rash. No one lesion that feels hard or dominant  Neurological:     General: No focal deficit present.     Mental Status: She is alert and oriented to person, place, and time.  Psychiatric:        Mood and Affect: Mood normal.        Behavior: Behavior normal.     No results found for any visits on 07/22/23.  Assessment & Plan     1. Atopic dermatitis, unspecified type Rx triamcinolone topically bid x 10 days If no improvement contact office, would bx / refer to derm given breast cancer history and chest wall radiation  - triamcinolone cream (KENALOG) 0.1 %; Apply 1 Application topically 2 (two) times daily.  Dispense: 30 g; Refill: 0   Return if symptoms worsen or fail to improve.      I, Alfredia Ferguson, PA-C have reviewed all documentation for this visit. The documentation on  07/22/23   for the exam, diagnosis, procedures, and orders are all accurate and complete.    Alfredia Ferguson, PA-C  [facility] [phone] (phone) [phone] (fax)  [facility]

## 2023-08-17 ENCOUNTER — Ambulatory Visit (INDEPENDENT_AMBULATORY_CARE_PROVIDER_SITE_OTHER): Payer: Medicare Other | Admitting: Internal Medicine

## 2023-08-17 ENCOUNTER — Encounter: Payer: Self-pay | Admitting: Internal Medicine

## 2023-08-17 VITALS — BP 122/74 | HR 72 | Temp 98.0°F | Resp 16 | Ht 64.0 in | Wt 174.5 lb

## 2023-08-17 DIAGNOSIS — E559 Vitamin D deficiency, unspecified: Secondary | ICD-10-CM | POA: Diagnosis not present

## 2023-08-17 DIAGNOSIS — I428 Other cardiomyopathies: Secondary | ICD-10-CM

## 2023-08-17 DIAGNOSIS — R739 Hyperglycemia, unspecified: Secondary | ICD-10-CM

## 2023-08-17 DIAGNOSIS — E785 Hyperlipidemia, unspecified: Secondary | ICD-10-CM

## 2023-08-17 DIAGNOSIS — M542 Cervicalgia: Secondary | ICD-10-CM

## 2023-08-17 LAB — LIPID PANEL
Cholesterol: 117 mg/dL (ref 0–200)
HDL: 57 mg/dL (ref >39.00)
LDL Cholesterol: 46 mg/dL (ref 0–99)
NonHDL: 59.59
Total CHOL/HDL Ratio: 2
Triglycerides: 68 mg/dL (ref 0.0–149.0)
VLDL: 13.6 mg/dL (ref 0.0–40.0)

## 2023-08-17 LAB — VITAMIN D 25 HYDROXY (VIT D DEFICIENCY, FRACTURES): VITD: 38.2 ng/mL (ref 30.00–100.00)

## 2023-08-17 LAB — HEMOGLOBIN A1C: Hgb A1c MFr Bld: 6.1 % (ref 4.6–6.5)

## 2023-08-17 LAB — ALT: ALT: 25 U/L (ref 0–35)

## 2023-08-17 LAB — AST: AST: 20 U/L (ref 0–37)

## 2023-08-17 MED ORDER — TIZANIDINE HCL 2 MG PO TABS: 2.0000 mg | ORAL_TABLET | Freq: Three times a day (TID) | ORAL | 0 refills | Status: DC | PRN

## 2023-08-17 NOTE — Patient Instructions (Addendum)
Vaccines I recommend:  Shingrix (shingles) Covid booster- new this fall RSV vaccine Flu shot.   Continue taking your blood pressure regularly Blood pressure goal:  between 110/65 and  135/85. If it is consistently higher or lower, let me know     GO TO THE LAB : Get the blood work     Next visit with me in 6 months, physical exam. Please schedule it at the front desk      If you have MyChart, please check frequently in the next few days for your results

## 2023-08-17 NOTE — Assessment & Plan Note (Signed)
Hyperglycemia: Check A1c. High cholesterol: Continue atorvastatin, check AST ALT and FLP. CV: Cardiology OV 05/12/2023, had lightheadedness and low BP, Entresto dose decreased.  Follow-up BMP was very good.  Currently lightheadedness slightly low BPs are very rare.  Continue present care. Neck pain: new to me. Going on for more than a year, currently having only mild exacerbations on and off.  Zanaflex helps.  Request a refill, sent. Vitamin D deficiency, on OTC supplements, recommend to take at least 2000 units daily, checking labs Vaccine advice provided. RTC 6 months CPX.

## 2023-08-17 NOTE — Progress Notes (Signed)
Subjective:    Patient ID: [name], female    DOB: 1951/11/14, 72 y.o.   MRN: [id number]  DOS:  08/17/2023 Type of visit - description: f/u  Since the last office visit is doing well. Chronic medical problems addressed. About a year ago developed right-sided neck pain with radiation to the right arm, currently problem is quiet w/ only mild exacerbations on and off, request muscle relaxants RFs.  Denies chest pain or difficulty breathing.  No edema. No nausea vomiting or diarrhea.   Review of Systems See above   Past Medical History:  Diagnosis Date   Allergy    Arthritis    Breast CA (HCC)    twice first on L breast 1982 w/ chest wall involvment, then a second breast ca on the R in 1987   Chronic systolic CHF (congestive heart failure) (HCC)    a. due to Adriamycin,s/p ICD;  b. 01/2013 Gen change and new LV lead - BSX Energen CRT-D BiV ICD, Ser # [id number]   Depression    Diverticulitis    Early menopause    early 30s   Fatigue 02/20/2016   Glaucoma suspect of both eyes    Dr. Wynell Balloon   Heart murmur    Hyperlipidemia    Nonischemic cardiomyopathy (HCC)    OSA (obstructive sleep apnea) 02/07/2016   Osteopenia    Renal calculus    Snoring 02/20/2016   TIA (transient ischemic attack)     Past Surgical History:  Procedure Laterality Date   BI-VENTRICULAR IMPLANTABLE CARDIOVERTER DEFIBRILLATOR UPGRADE N/A 02/04/2013   Procedure: BI-VENTRICULAR IMPLANTABLE CARDIOVERTER DEFIBRILLATOR UPGRADE;  Surgeon: Marinus Maw, MD;  Location: [facility] CATH LAB;  Service: Cardiovascular;  Laterality: N/A;   BIV ICD GENERATOR CHANGEOUT N/A 02/06/2023   Procedure: BIV ICD GENERATOR CHANGEOUT;  Surgeon: Duke Salvia, MD;  Location: [facility] INVASIVE CV LAB;  Service: Cardiovascular;  Laterality: N/A;   CARDIAC DEFIBRILLATOR PLACEMENT     AICD Replaced-5/09 and 2014   LEFT AND RIGHT HEART CATHETERIZATION WITH CORONARY ANGIOGRAM N/A 10/06/2013   Procedure: LEFT AND RIGHT HEART  CATHETERIZATION WITH CORONARY ANGIOGRAM;  Surgeon: Dolores Patty, MD;  Location: [facility] CATH LAB;  Service: Cardiovascular;  Laterality: N/A;   LITHOTRIPSY     h/o several procedures    MASTECTOMY Bilateral    OOPHORECTOMY Bilateral    triger finger, R ring finger Right 02/2019   VENOGRAM N/A 10/20/2012   Procedure: VENOGRAM;  Surgeon: Duke Salvia, MD;  Location: [facility] CATH LAB;  Service: Cardiovascular;  Laterality: N/A;    Current Outpatient Medications  Medication Instructions   amoxicillin (AMOXIL) 500 MG capsule Take 4 capsules 30 minutes before dental proedure.   atorvastatin (LIPITOR) 40 mg, Oral, Daily   carvedilol (COREG) 12.5 MG tablet Take 1 tablet by mouth in the morning and take 1 tablet at night   cholecalciferol (VITAMIN D3) 2,000 Units, Oral, Daily   clopidogrel (PLAVIX) 75 mg, Oral, Daily   fexofenadine (ALLEGRA ALLERGY) 60 mg, Oral, 2 times daily   furosemide (LASIX) 40 MG tablet Take 2 tablets (80 mg total) by mouth in the morning AND 1.5 tablets (60 mg total) every evening.   gabapentin (NEURONTIN) 300 mg, Oral, Daily PRN   Multiple Vitamin (MULTIVITAMIN) capsule 1 capsule, Oral, Daily, Focus factor   Multiple Vitamins-Minerals (ICAPS AREDS 2 PO) 1 tablet, Oral, Daily   Omega 3 1,000 mg, Oral, Daily   pantoprazole (PROTONIX) 40 mg, Oral, Daily   Polyvinyl Alcohol-Povidone PF (REFRESH)  1.4-0.6 % SOLN 1 drop, Both Eyes, Daily PRN   potassium chloride (KLOR-CON M) 10 MEQ tablet 40 mEq, Oral, Daily   sacubitril-valsartan (ENTRESTO) 24-26 MG 1 tablet, Oral, 2 times daily   sertraline (ZOLOFT) 25 mg, Oral, Daily   spironolactone (ALDACTONE) 25 mg, Oral, Daily   tiZANidine (ZANAFLEX) 2 mg, Oral, Every 8 hours PRN   triamcinolone cream (KENALOG) 0.1 % 1 Application, Topical, 2 times daily   TURMERIC PO 1,000 mg, Oral, 2 times daily   zinc gluconate 50 mg, Oral, Daily   zolpidem (AMBIEN) 10 MG tablet TAKE ONE-HALF TO ONE TABLET BY MOUTH DAILY AT BEDTIME AS NEEDED FOR  SLEEP       Objective:   Physical Exam BP 122/74   Pulse 72   Temp 98 F (36.7 C) (Oral)   Resp 16   Ht 5\' 4"  (1.626 m)   Wt 174 lb 8 oz (79.2 kg)   SpO2 97%   BMI 29.95 kg/m  General:   Well developed, NAD, BMI noted. HEENT:  Normocephalic . Face symmetric, atraumatic Lungs:  CTA B Normal respiratory effort, no intercostal retractions, no accessory muscle use. Heart: RRR,  no murmur.  Lower extremities: no pretibial edema bilaterally  Skin: Not pale. Not jaundice Neurologic:  alert & oriented X3.  Speech normal, gait appropriate for age and unassisted Psych--  Cognition and judgment appear intact.  Cooperative with normal attention span and concentration.  Behavior appropriate. No anxious or depressed appearing.      Assessment    Assessment Hyperglycemia A1c 5.9 (04/2017) High cholesterol CV:Dr. Graciela Husbands and Bensimohn --CHF, nonischemic cardiomyopathy:due to chemotherapy --MR severe --VT -TIA 05-2017 Depression, insomnia Mild OSA per sleep study 11-2015, saw Dr Mayford Knife 02-20-16: no need for CPAP Osteopenia-- DEXA 06/13/2015 normal Vitamin D deficiency Kidney stones Menopause Breast cancer x 2: first in 1982 with chest wall involvement, 2nd on the R dx  1987, s/p B mastectomy; + BRACA Handicap sticker signed [phone] Post herpetic neuralgia (shingles 2014) , R face-- gaba prn  PLAN: Hyperglycemia: Check A1c. High cholesterol: Continue atorvastatin, check AST ALT and FLP. CV: Cardiology OV 05/12/2023, had lightheadedness and low BP, Entresto dose decreased.  Follow-up BMP was very good.  Currently lightheadedness slightly low BPs are very rare.  Continue present care. Neck pain: new to me. Going on for more than a year, currently having only mild exacerbations on and off.  Zanaflex helps.  Request a refill, sent. Vitamin D deficiency, on OTC supplements, recommend to take at least 2000 units daily, checking labs Vaccine advice provided. RTC 6 months CPX.

## 2023-08-31 ENCOUNTER — Other Ambulatory Visit (HOSPITAL_COMMUNITY): Payer: Self-pay

## 2023-09-03 ENCOUNTER — Other Ambulatory Visit (HOSPITAL_COMMUNITY): Payer: Self-pay

## 2023-09-03 DIAGNOSIS — I5022 Chronic systolic (congestive) heart failure: Secondary | ICD-10-CM

## 2023-09-03 MED ORDER — POTASSIUM CHLORIDE CRYS ER 10 MEQ PO TBCR: 40.0000 meq | EXTENDED_RELEASE_TABLET | Freq: Every day | ORAL | 3 refills | Status: DC

## 2023-09-22 ENCOUNTER — Ambulatory Visit (INDEPENDENT_AMBULATORY_CARE_PROVIDER_SITE_OTHER): Payer: Medicare Other | Admitting: Internal Medicine

## 2023-09-22 ENCOUNTER — Encounter: Payer: Self-pay | Admitting: Internal Medicine

## 2023-09-22 ENCOUNTER — Ambulatory Visit (HOSPITAL_BASED_OUTPATIENT_CLINIC_OR_DEPARTMENT_OTHER)
Admission: RE | Admit: 2023-09-22 | Discharge: 2023-09-22 | Disposition: A | Discharge status: Home / Self Care | Payer: Medicare Other | Source: Ambulatory Visit | Attending: Internal Medicine | Admitting: Internal Medicine

## 2023-09-22 VITALS — BP 134/80 | HR 64 | Temp 98.1°F | Resp 16 | Ht 64.0 in | Wt 173.5 lb

## 2023-09-22 DIAGNOSIS — R0789 Other chest pain: Secondary | ICD-10-CM | POA: Insufficient documentation

## 2023-09-22 NOTE — Patient Instructions (Addendum)
Go to the first floor, get a chest x-ray.  It may take several days before they release it.  In the meantime, take Tylenol as needed.  Call if the pain becomes severe, if you have fever, chills, stomach pain or difficulty breathing.

## 2023-09-22 NOTE — Progress Notes (Unsigned)
Subjective:    Patient ID: [name], female    DOB: [birth year], 72 y.o.   MRN: [id number]  DOS:  09/22/2023 Type of visit - description: Acute  Symptoms started 3 weeks ago, pain is located on the anterior left chest wall. Denies any injury, no recent episodes of cough. No rash. The patient is steady varies from mild to moderate. It does increase with deep breathing or moving her torso.  No recent fever or chills.  No recent viral syndrome.  Some weight loss but is mostly on purpose.  Review of Systems See above   Past Medical History:  Diagnosis Date   Allergy    Arthritis    Breast CA (HCC)    twice first on L breast 1982 w/ chest wall involvment, then a second breast ca on the R in 1987   Chronic systolic CHF (congestive heart failure) (HCC)    a. due to Adriamycin,s/p ICD;  b. 01/2013 Gen change and new LV lead - BSX Energen CRT-D BiV ICD, Ser # 324401   Depression    Diverticulitis    Early menopause    early 30s   Fatigue 02/20/2016   Glaucoma suspect of both eyes    Dr. Wynell Balloon   Heart murmur    Hyperlipidemia    Nonischemic cardiomyopathy (HCC)    OSA (obstructive sleep apnea) 02/07/2016   Osteopenia    Renal calculus    Snoring 02/20/2016   TIA (transient ischemic attack)     Past Surgical History:  Procedure Laterality Date   BI-VENTRICULAR IMPLANTABLE CARDIOVERTER DEFIBRILLATOR UPGRADE N/A 02/04/2013   Procedure: BI-VENTRICULAR IMPLANTABLE CARDIOVERTER DEFIBRILLATOR UPGRADE;  Surgeon: Marinus Maw, MD;  Location: [facility] CATH LAB;  Service: Cardiovascular;  Laterality: N/A;   BIV ICD GENERATOR CHANGEOUT N/A 02/06/2023   Procedure: BIV ICD GENERATOR CHANGEOUT;  Surgeon: Duke Salvia, MD;  Location: [facility] INVASIVE CV LAB;  Service: Cardiovascular;  Laterality: N/A;   CARDIAC DEFIBRILLATOR PLACEMENT     AICD Replaced-5/09 and 2014   LEFT AND RIGHT HEART CATHETERIZATION WITH CORONARY ANGIOGRAM N/A 10/06/2013   Procedure: LEFT AND RIGHT HEART  CATHETERIZATION WITH CORONARY ANGIOGRAM;  Surgeon: Dolores Patty, MD;  Location: [facility] CATH LAB;  Service: Cardiovascular;  Laterality: N/A;   LITHOTRIPSY     h/o several procedures    MASTECTOMY Bilateral    OOPHORECTOMY Bilateral    triger finger, R ring finger Right 02/2019   VENOGRAM N/A 10/20/2012   Procedure: VENOGRAM;  Surgeon: Duke Salvia, MD;  Location: [facility] CATH LAB;  Service: Cardiovascular;  Laterality: N/A;    Current Outpatient Medications  Medication Instructions   amoxicillin (AMOXIL) 500 MG capsule Take 4 capsules 30 minutes before dental proedure.   atorvastatin (LIPITOR) 40 mg, Oral, Daily   carvedilol (COREG) 12.5 MG tablet Take 1 tablet by mouth in the morning and take 1 tablet at night   cholecalciferol (VITAMIN D3) 2,000 Units, Oral, Daily   clopidogrel (PLAVIX) 75 mg, Oral, Daily   fexofenadine (ALLEGRA ALLERGY) 60 mg, Oral, 2 times daily   furosemide (LASIX) 40 MG tablet Take 2 tablets (80 mg total) by mouth in the morning AND 1.5 tablets (60 mg total) every evening.   gabapentin (NEURONTIN) 300 mg, Oral, Daily PRN   Multiple Vitamin (MULTIVITAMIN) capsule 1 capsule, Oral, Daily, Focus factor   Multiple Vitamins-Minerals (ICAPS AREDS 2 PO) 1 tablet, Oral, Daily   Omega 3 1,000 mg, Oral, Daily   pantoprazole (PROTONIX) 40 mg, Oral, Daily  Polyvinyl Alcohol-Povidone PF (REFRESH) 1.4-0.6 % SOLN 1 drop, Both Eyes, Daily PRN   potassium chloride (KLOR-CON M) 10 MEQ tablet 40 mEq, Oral, Daily   sacubitril-valsartan (ENTRESTO) 24-26 MG 1 tablet, Oral, 2 times daily   sertraline (ZOLOFT) 25 mg, Oral, Daily   spironolactone (ALDACTONE) 25 mg, Oral, Daily   tiZANidine (ZANAFLEX) 2 mg, Oral, Every 8 hours PRN   triamcinolone cream (KENALOG) 0.1 % 1 Application, Topical, 2 times daily   TURMERIC PO 1,000 mg, Oral, 2 times daily   zinc gluconate 50 mg, Oral, Daily   zolpidem (AMBIEN) 10 MG tablet TAKE ONE-HALF TO ONE TABLET BY MOUTH DAILY AT BEDTIME AS NEEDED FOR  SLEEP       Objective:   Physical Exam BP 134/80   Pulse 64   Temp 98.1 F (36.7 C) (Oral)   Resp 16   Ht 5\' 4"  (1.626 m)   Wt 173 lb 8 oz (78.7 kg)   SpO2 98%   BMI 29.78 kg/m  General:   Well developed, NAD, BMI noted.  HEENT:  Normocephalic . Face symmetric, atraumatic Chest wall: Extensive surgical scars from previous mastectomy and reconstruction. Chest wall no TTP at the anterior aspect on the left side. Skin is normal Left reconstructed breast seems normal to palpation. Lungs:  CTA B Normal respiratory effort, no intercostal retractions, no accessory muscle use. Heart: RRR,  no murmur.  Abdomen:  Not distended, soft, non-tender. No rebound or rigidity.  No organomegaly that I can tell Skin: Not pale. Not jaundice Lower extremities: no pretibial edema bilaterally  Neurologic:  alert & oriented X3.  Speech normal, gait appropriate for age and unassisted Psych--  Cognition and judgment appear intact.  Cooperative with normal attention span and concentration.  Behavior appropriate. No anxious or depressed appearing.     Assessment     Assessment Hyperglycemia A1c 5.9 (04/2017) High cholesterol CV:Dr. Graciela Husbands and Bensimohn --CHF, nonischemic cardiomyopathy:due to chemotherapy --MR severe --VT -TIA 05-2017 Depression, insomnia Mild OSA per sleep study 11-2015, saw Dr Mayford Knife 02-20-16: no need for CPAP Osteopenia-- DEXA 06/13/2015 normal Vitamin D deficiency Kidney stones Menopause Breast cancer x 2: first in 1982 with chest wall involvement, 2nd on the R dx  1987, s/p B mastectomy; + BRACA Handicap sticker signed [phone] Post herpetic neuralgia (shingles 2014) , R face-- gaba prn  PLAN: L chest wall pain: As described above, history of breast cancer status post mastectomy bilaterally and reconstruction. No red flags, exa is benign. Plan: Chest x-ray, if negative, will need further evaluation if symptoms continue. 9- Hyperglycemia: Check A1c. High  cholesterol: Continue atorvastatin, check AST ALT and FLP. CV: Cardiology OV 05/12/2023, had lightheadedness and low BP, Entresto dose decreased.  Follow-up BMP was very good.  Currently lightheadedness slightly low BPs are very rare.  Continue present care. Neck pain: new to me. Going on for more than a year, currently having only mild exacerbations on and off.  Zanaflex helps.  Request a refill, sent. Vitamin D deficiency, on OTC supplements, recommend to take at least 2000 units daily, checking labs Vaccine advice provided. RTC 6 months CPX.

## 2023-09-23 NOTE — Assessment & Plan Note (Signed)
L chest wall pain: As described above, history of breast cancer status post mastectomy bilaterally and reconstruction. No red flags, exam is benign. Plan: Chest x-ray, if negative, will need further evaluation if symptoms continue.

## 2023-10-06 ENCOUNTER — Telehealth: Payer: Self-pay | Admitting: Internal Medicine

## 2023-10-06 DIAGNOSIS — D2371 Other benign neoplasm of skin of right lower limb, including hip: Secondary | ICD-10-CM | POA: Diagnosis not present

## 2023-10-06 DIAGNOSIS — Z85828 Personal history of other malignant neoplasm of skin: Secondary | ICD-10-CM | POA: Diagnosis not present

## 2023-10-06 DIAGNOSIS — L821 Other seborrheic keratosis: Secondary | ICD-10-CM | POA: Diagnosis not present

## 2023-10-06 DIAGNOSIS — L814 Other melanin hyperpigmentation: Secondary | ICD-10-CM | POA: Diagnosis not present

## 2023-10-06 DIAGNOSIS — L905 Scar conditions and fibrosis of skin: Secondary | ICD-10-CM | POA: Diagnosis not present

## 2023-10-06 DIAGNOSIS — L570 Actinic keratosis: Secondary | ICD-10-CM | POA: Diagnosis not present

## 2023-10-06 DIAGNOSIS — Z08 Encounter for follow-up examination after completed treatment for malignant neoplasm: Secondary | ICD-10-CM | POA: Diagnosis not present

## 2023-10-07 NOTE — Telephone Encounter (Signed)
Requesting: Ambien 10mg   Contract: 02/13/23 UDS: Ambien only Last Visit: 09/22/23 Next Visit: 02/15/24 Last Refill: 04/21/23 #90 and 0RF   Please Advise

## 2023-10-07 NOTE — Telephone Encounter (Signed)
PDMP okay, Rx sent 

## 2023-10-30 DIAGNOSIS — M1711 Unilateral primary osteoarthritis, right knee: Secondary | ICD-10-CM | POA: Diagnosis not present

## 2023-11-02 ENCOUNTER — Ambulatory Visit (INDEPENDENT_AMBULATORY_CARE_PROVIDER_SITE_OTHER): Payer: Medicare Other | Admitting: Internal Medicine

## 2023-11-02 ENCOUNTER — Other Ambulatory Visit: Payer: Self-pay | Admitting: Internal Medicine

## 2023-11-02 ENCOUNTER — Encounter: Payer: Self-pay | Admitting: Internal Medicine

## 2023-11-02 ENCOUNTER — Encounter: Payer: Self-pay | Admitting: Pharmacist

## 2023-11-02 ENCOUNTER — Ambulatory Visit (HOSPITAL_BASED_OUTPATIENT_CLINIC_OR_DEPARTMENT_OTHER)
Admission: RE | Admit: 2023-11-02 | Discharge: 2023-11-02 | Disposition: A | Discharge status: Home / Self Care | Payer: Medicare Other | Source: Ambulatory Visit | Attending: Internal Medicine | Admitting: Internal Medicine

## 2023-11-02 VITALS — BP 136/82 | HR 60 | Temp 97.9°F | Resp 16 | Ht 64.0 in | Wt 171.2 lb

## 2023-11-02 DIAGNOSIS — M7989 Other specified soft tissue disorders: Secondary | ICD-10-CM

## 2023-11-02 DIAGNOSIS — M7121 Synovial cyst of popliteal space [Baker], right knee: Secondary | ICD-10-CM | POA: Diagnosis not present

## 2023-11-02 NOTE — Progress Notes (Unsigned)
Subjective:    Patient ID: [name], female    DOB: 22-Oct-1951, 72 y.o.   MRN: [id number]  DOS:  11/02/2023 Type of visit - description: Acute Having right leg pain for few months, has seen orthopedics for the condition. In the last 2 months have noticed some swelling at the right lower extremity, from the knee down, worse at the end of the days, decreased with resting.  Went to see orthopedics a week ago, going right knee shot and was recommended to see PCP for swelling. Knee pain is better. Denies chest pain or difficulty breathing. No palpitation. No prolonged trips or airplane trip before the onset of symptoms, she did go to the [employer] to 3 weeks ago.  Review of Systems See above   Past Medical History:  Diagnosis Date   Allergy    Arthritis    Breast CA (HCC)    twice first on L breast 1982 w/ chest wall involvment, then a second breast ca on the R in 1987   Chronic systolic CHF (congestive heart failure) (HCC)    a. due to Adriamycin,s/p ICD;  b. 01/2013 Gen change and new LV lead - BSX [employer] CRT-D BiV ICD, Ser # 244010   Depression    Diverticulitis    Early menopause    early 30s   Fatigue 02/20/2016   Glaucoma suspect of both eyes    Dr. Wynell Balloon   Heart murmur    Hyperlipidemia    Nonischemic cardiomyopathy (HCC)    OSA (obstructive sleep apnea) 02/07/2016   Osteopenia    Renal calculus    Snoring 02/20/2016   TIA (transient ischemic attack)     Past Surgical History:  Procedure Laterality Date   BI-VENTRICULAR IMPLANTABLE CARDIOVERTER DEFIBRILLATOR UPGRADE N/A 02/04/2013   Procedure: BI-VENTRICULAR IMPLANTABLE CARDIOVERTER DEFIBRILLATOR UPGRADE;  Surgeon: Marinus Maw, MD;  Location: [facility] CATH LAB;  Service: Cardiovascular;  Laterality: N/A;   BIV ICD GENERATOR CHANGEOUT N/A 02/06/2023   Procedure: BIV ICD GENERATOR CHANGEOUT;  Surgeon: Duke Salvia, MD;  Location: [facility] INVASIVE CV LAB;  Service: Cardiovascular;  Laterality: N/A;    CARDIAC DEFIBRILLATOR PLACEMENT     AICD Replaced-5/09 and 2014   LEFT AND RIGHT HEART CATHETERIZATION WITH CORONARY ANGIOGRAM N/A 10/06/2013   Procedure: LEFT AND RIGHT HEART CATHETERIZATION WITH CORONARY ANGIOGRAM;  Surgeon: Dolores Patty, MD;  Location: [facility] CATH LAB;  Service: Cardiovascular;  Laterality: N/A;   LITHOTRIPSY     h/o several procedures    MASTECTOMY Bilateral    OOPHORECTOMY Bilateral    triger finger, R ring finger Right 02/2019   VENOGRAM N/A 10/20/2012   Procedure: VENOGRAM;  Surgeon: Duke Salvia, MD;  Location: [facility] CATH LAB;  Service: Cardiovascular;  Laterality: N/A;    Current Outpatient Medications  Medication Instructions   amoxicillin (AMOXIL) 500 MG capsule Take 4 capsules 30 minutes before dental proedure.   atorvastatin (LIPITOR) 40 mg, Oral, Daily   carvedilol (COREG) 12.5 MG tablet Take 1 tablet by mouth in the morning and take 1 tablet at night   cholecalciferol (VITAMIN D3) 2,000 Units, Oral, Daily   clopidogrel (PLAVIX) 75 mg, Oral, Daily   fexofenadine (ALLEGRA ALLERGY) 60 mg, Oral, 2 times daily   furosemide (LASIX) 40 MG tablet Take 2 tablets (80 mg total) by mouth in the morning AND 1.5 tablets (60 mg total) every evening.   gabapentin (NEURONTIN) 300 mg, Oral, Daily PRN   Multiple Vitamin (MULTIVITAMIN) capsule 1 capsule, Oral,  Daily, Focus factor   Multiple Vitamins-Minerals (ICAPS AREDS 2 PO) 1 tablet, Oral, Daily   Omega 3 1,000 mg, Oral, Daily   pantoprazole (PROTONIX) 40 mg, Oral, Daily   Polyvinyl Alcohol-Povidone PF (REFRESH) 1.4-0.6 % SOLN 1 drop, Both Eyes, Daily PRN   potassium chloride (KLOR-CON M) 10 MEQ tablet 40 mEq, Oral, Daily   sacubitril-valsartan (ENTRESTO) 24-26 MG 1 tablet, Oral, 2 times daily   sertraline (ZOLOFT) 25 mg, Oral, Daily   spironolactone (ALDACTONE) 25 mg, Oral, Daily   tiZANidine (ZANAFLEX) 2 mg, Oral, Every 8 hours PRN   triamcinolone cream (KENALOG) 0.1 % 1 Application, Topical, 2 times daily    TURMERIC PO 1,000 mg, Oral, 2 times daily   zinc gluconate 50 mg, Oral, Daily   zolpidem (AMBIEN) 10 MG tablet TAKE ONE-HALF TO ONE TABLET BY MOUTH DAILY AT BEDTIME AS NEEDED FOR SLEEP       Objective:   Physical Exam BP 136/82   Pulse 60   Temp 97.9 F (36.6 C) (Oral)   Resp 16   Ht 5\' 4"  (1.626 m)   Wt 171 lb 4 oz (77.7 kg)   SpO2 96%   BMI 29.39 kg/m  General:   Well developed, NAD, BMI noted. HEENT:  Normocephalic . Face symmetric, atraumatic   Lower extremities:  No pretibial edema. Femoral pedal pulses: Normal bilaterally No groin lymphadenopathies R Calf -- soft, nontender but is 3/4  inch larger in circumference compared to the L Skin: Not pale. Not jaundice Neurologic:  alert & oriented X3.  Speech normal, gait appropriate for age and unassisted Psych--  Cognition and judgment appear intact.  Cooperative with normal attention span and concentration.  Behavior appropriate. No anxious or depressed appearing.      Assessment     Assessment Hyperglycemia A1c 5.9 (04/2017) High cholesterol CV:Dr. Graciela Husbands and Bensimohn --CHF, nonischemic cardiomyopathy:due to chemotherapy --MR severe --VT -TIA 05-2017 Depression, insomnia Mild OSA per sleep study 11-2015, saw Dr Mayford Knife 02-20-16: no need for CPAP Osteopenia-- DEXA 06/13/2015 normal Vitamin D deficiency Kidney stones Menopause Breast cancer x 2: first in 1982 with chest wall involvement, 2nd on the R dx  1987, s/p B mastectomy; + BRACA Handicap sticker signed [phone] Post herpetic neuralgia (shingles 2014) , R face-- gaba prn  PLAN: Right leg swelling: On and off for 2 months, on exam no vascular insufficiency, calf is soft and nontender, low for DVT but will proceed with ultrasound. If negative, it could be simply asymmetric pending edema, would recommend observation for now.

## 2023-11-02 NOTE — Progress Notes (Signed)
Pharmacy Quality Measure Review  This patient is appearing on a report for being at risk of failing the adherence measure for cholesterol (statin) medications this calendar year.   Medication: ATROVASTATIN Last fill date: 06/24/2023 for 90 day supply  Reviewed medication indication, dosing with patient. Confirmed she is still taking atorvastatin 40mg  once a day.  With patient's permission requested RF from [facility].   Henrene Pastor, PharmD Clinical Pharmacist [facility]

## 2023-11-02 NOTE — Patient Instructions (Addendum)
Arrange for a ultrasound of your right leg.  See you in March, sooner if needed

## 2023-11-03 NOTE — Assessment & Plan Note (Signed)
Right leg swelling: On and off for 2 months, on exam no vascular insufficiency, calf is soft and nontender, low for DVT but will proceed with ultrasound. If negative, it could be simply asymmetric pending edema, would recommend observation for now.

## 2023-11-09 ENCOUNTER — Ambulatory Visit: Payer: Medicare Other

## 2023-11-09 DIAGNOSIS — I5022 Chronic systolic (congestive) heart failure: Secondary | ICD-10-CM

## 2023-11-09 DIAGNOSIS — I428 Other cardiomyopathies: Secondary | ICD-10-CM

## 2023-11-10 LAB — CUP PACEART REMOTE DEVICE CHECK
Battery Remaining Longevity: 126 mo
Battery Remaining Percentage: 100 %
Brady Statistic RA Percent Paced: 0 %
Brady Statistic RV Percent Paced: 100 %
Date Time Interrogation Session: 20241216010200
HighPow Impedance: 52 Ohm
Implantable Lead Connection Status: 753985
Implantable Lead Connection Status: 753985
Implantable Lead Connection Status: 753985
Implantable Lead Implant Date: 20050701
Implantable Lead Implant Date: 20050701
Implantable Lead Implant Date: 20140314
Implantable Lead Location: 753858
Implantable Lead Location: 753859
Implantable Lead Location: 753860
Implantable Lead Model: 158
Implantable Lead Model: 4196
Implantable Lead Model: 5076
Implantable Lead Serial Number: 156416
Implantable Pulse Generator Implant Date: 20240315
Lead Channel Impedance Value: 482 Ohm
Lead Channel Impedance Value: 506 Ohm
Lead Channel Impedance Value: 590 Ohm
Lead Channel Pacing Threshold Amplitude: 0.5 V
Lead Channel Pacing Threshold Amplitude: 1 V
Lead Channel Pacing Threshold Pulse Width: 0.4 ms
Lead Channel Pacing Threshold Pulse Width: 0.4 ms
Lead Channel Setting Pacing Amplitude: 2 V
Lead Channel Setting Pacing Amplitude: 2 V
Lead Channel Setting Pacing Amplitude: 2 V
Lead Channel Setting Pacing Pulse Width: 0.4 ms
Lead Channel Setting Pacing Pulse Width: 0.6 ms
Lead Channel Setting Sensing Sensitivity: 0.6 mV
Lead Channel Setting Sensing Sensitivity: 1 mV
Pulse Gen Serial Number: 159739

## 2023-11-12 DIAGNOSIS — H40013 Open angle with borderline findings, low risk, bilateral: Secondary | ICD-10-CM | POA: Diagnosis not present

## 2023-11-12 DIAGNOSIS — H16223 Keratoconjunctivitis sicca, not specified as Sjogren's, bilateral: Secondary | ICD-10-CM | POA: Diagnosis not present

## 2023-11-12 DIAGNOSIS — H04123 Dry eye syndrome of bilateral lacrimal glands: Secondary | ICD-10-CM | POA: Diagnosis not present

## 2023-11-12 DIAGNOSIS — H353131 Nonexudative age-related macular degeneration, bilateral, early dry stage: Secondary | ICD-10-CM | POA: Diagnosis not present

## 2023-12-17 NOTE — Addendum Note (Signed)
Addended by: Geralyn Flash D on: 12/17/2023 11:55 AM   Modules accepted: Orders

## 2023-12-17 NOTE — Progress Notes (Signed)
Remote ICD transmission.   

## 2023-12-22 ENCOUNTER — Other Ambulatory Visit: Payer: Self-pay | Admitting: Internal Medicine

## 2023-12-23 ENCOUNTER — Telehealth (HOSPITAL_COMMUNITY): Payer: Self-pay

## 2023-12-23 NOTE — Telephone Encounter (Signed)
Advanced Heart Failure Patient Advocate Encounter  The patient was approved for a [employer] grant that will help cover the cost of Carvedilol, Entresto, Spironolactone.  Total amount awarded, $10,000.  Effective: 11/23/2023 - 11/21/2024.  BIN [id number] PCN [id number] [id number] ID [id number]  Pharmacy provided with approval and processing information. Confirmed $0 copay. Patient informed via phone.  Burnell Blanks, CPhT Rx Patient Advocate Phone: [phone]

## 2023-12-28 DIAGNOSIS — M7052 Other bursitis of knee, left knee: Secondary | ICD-10-CM | POA: Diagnosis not present

## 2023-12-28 DIAGNOSIS — M1712 Unilateral primary osteoarthritis, left knee: Secondary | ICD-10-CM | POA: Diagnosis not present

## 2023-12-30 ENCOUNTER — Encounter (HOSPITAL_COMMUNITY): Payer: Self-pay | Admitting: Internal Medicine

## 2023-12-30 ENCOUNTER — Ambulatory Visit (HOSPITAL_COMMUNITY)
Admission: RE | Admit: 2023-12-30 | Discharge: 2023-12-30 | Disposition: A | Discharge status: Home / Self Care | Payer: Medicare Other | Source: Ambulatory Visit | Attending: Internal Medicine | Admitting: Internal Medicine

## 2023-12-30 VITALS — BP 102/60 | HR 57 | Wt 176.6 lb

## 2023-12-30 DIAGNOSIS — Z8673 Personal history of transient ischemic attack (TIA), and cerebral infarction without residual deficits: Secondary | ICD-10-CM | POA: Diagnosis not present

## 2023-12-30 DIAGNOSIS — Z853 Personal history of malignant neoplasm of breast: Secondary | ICD-10-CM | POA: Insufficient documentation

## 2023-12-30 DIAGNOSIS — I351 Nonrheumatic aortic (valve) insufficiency: Secondary | ICD-10-CM | POA: Diagnosis not present

## 2023-12-30 DIAGNOSIS — I340 Nonrheumatic mitral (valve) insufficiency: Secondary | ICD-10-CM

## 2023-12-30 DIAGNOSIS — Z79899 Other long term (current) drug therapy: Secondary | ICD-10-CM | POA: Insufficient documentation

## 2023-12-30 DIAGNOSIS — I38 Endocarditis, valve unspecified: Secondary | ICD-10-CM | POA: Diagnosis not present

## 2023-12-30 DIAGNOSIS — Z7902 Long term (current) use of antithrombotics/antiplatelets: Secondary | ICD-10-CM | POA: Diagnosis not present

## 2023-12-30 DIAGNOSIS — I428 Other cardiomyopathies: Secondary | ICD-10-CM | POA: Insufficient documentation

## 2023-12-30 DIAGNOSIS — I5022 Chronic systolic (congestive) heart failure: Secondary | ICD-10-CM | POA: Insufficient documentation

## 2023-12-30 DIAGNOSIS — Z95810 Presence of automatic (implantable) cardiac defibrillator: Secondary | ICD-10-CM

## 2023-12-30 LAB — BASIC METABOLIC PANEL
Anion gap: 8 (ref 5–15)
BUN: 13 mg/dL (ref 8–23)
CO2: 25 mmol/L (ref 22–32)
Calcium: 10.4 mg/dL — ABNORMAL HIGH (ref 8.9–10.3)
Chloride: 106 mmol/L (ref 98–111)
Creatinine, Ser: 0.8 mg/dL (ref 0.44–1.00)
GFR, Estimated: >60 mL/min (ref >60)
Glucose, Bld: 122 mg/dL — ABNORMAL HIGH (ref 70–99)
Potassium: 4.6 mmol/L (ref 3.5–5.1)
Sodium: 139 mmol/L (ref 135–145)

## 2023-12-30 LAB — BRAIN NATRIURETIC PEPTIDE: B Natriuretic Peptide: 360.7 pg/mL — ABNORMAL HIGH (ref 0.0–100.0)

## 2023-12-30 NOTE — Patient Instructions (Signed)
 Great to see you today!!!  No changes, continue current medications  Labs done today, your results will be available in MyChart, we will contact you for abnormal readings.  Your physician recommends that you schedule a follow-up appointment in: 9 months with an echocardiogram (Nov), **PLEASE CALL OUR OFFICE IN SEPTEMBER TO SCHEDULE THIS APPOINTMENT  If you have any questions or concerns before your next appointment please send us  a message through [facility] [place] or call our office at [phone].    TO LEAVE A MESSAGE FOR THE NURSE SELECT OPTION 2, PLEASE LEAVE A MESSAGE INCLUDING: YOUR NAME DATE OF BIRTH CALL BACK NUMBER REASON FOR CALL**this is important as we prioritize the call backs  YOU WILL RECEIVE A CALL BACK THE SAME DAY AS LONG AS YOU CALL BEFORE 4:00 PM  At the [facility], you and your health needs are our priority. As part of our continuing mission to provide you with exceptional heart care, we have created designated Provider Care Teams. These Care Teams include your primary Cardiologist (physician) and Advanced Practice Providers (APPs- Physician Assistants and Nurse Practitioners) who all work together to provide you with the care you need, when you need it.   You may see any of the following providers on your designated Care Team at your next follow up: Dr Toribio Fuel Dr Ezra Shuck Dr. Ria Commander Dr. Morene Brownie Amy Lenetta, NP Caffie Shed, [place] [facility], [place] Beckey Coe, NP Jordan Lee, NP Tinnie Redman, PharmD   Please be sure to bring in all your medications bottles to every appointment.    Thank you for choosing [facility]

## 2023-12-30 NOTE — Progress Notes (Signed)
 ADVANCED HEART FAILURE CLINIC NOTE  Patient ID: [name], female   DOB: June 09, 1951, 73 y.o.   MRN: [id number] PCP: Dr Amon EP: Dr Fernande HF Cardiologist: Dr. Cherrie  HPI [name] is a 73 y.o.woman with h/o chronic systolic presumed due to chemotherapy-related cardiomyopathy, left breast cancer in 1982 treated with chemo (including adriamycin)/XRT and L mastectomy. Had R breast cancer (unrelated) in 1987. Has been cancer free since. Denies HTN, HL, DM2.  Was first diagnosed with HF in 1988. Had left heart cath she thinks in 2009. 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 Echo 2009 EF 20% with moderate AI and mild to moderate MR. Had [employer] ICD placed and then LV lead became nonfunctional. In 3/14, underwent CRT revision.   Had sleep study 1/17 very mild OSA (6.2/hr) not using CPAP.   Admitted in 6/18 withTIA. Unable to use right hand and speech slurred. CT normal. Unable to do MRI.   Today she returns for HF follow up.Overall feeling fine. Mild SOB with exertion. Denies PND/Orthopnea. Appetite ok. No fever or chills. Weight at home  has been stable. Asking about weight loss medications. Taking all medications  Today she returns for HF follow up. Feels pretty good. Can do basic activities without to much problem. Gets fatigued easily. No real change. Has RLE edema No orthopnea or PND.    Echo 06/22/23, EF 45%  .  Cardiac studies  Echo (11/14) with Dr. Blanca: EF 10-15% with significant dyssynchrony. Subsequently had LV lead checked and was functioning well.  Echo (7/15):  EF 10-15%, moderate to severe RV dysfunction Echo (4/16):EF 15% normal RV moderate AI Echo (9/17):EF 20-25% RV ok. Severe MR mild AI/AS Echo (6/18): EF 20-25% RV ok Mild to moderate AI  Echo (12/19): EF 25% RV ok  Echo (9/22): EF 40-45% Mild AS Mod AI Mod MR   - CPX 2/19 FVC 3.41  (117%)      FEV1 2.45 (108%)        FEV1/FVC 72 (92%)        MVV 78 (89%)      Resting  HR: 74 Peak HR: 121   (80% age predicted max HR) BP rest: 108/56 BP peak: 136/58 Peak VO2: 18.3 (103% predicted peak VO2) VE/VCO2 slope:  30 OUES: 2.45 Peak RER: 0.95  - CPX 10/17 FVC 3.44 (113%)      FEV1 2.32 (97%)        FEV1/FVC 67 (86%)        MVV 90 (101%)      Resting HR: 72 Peak HR: 112 (73% age predicted max HR)  BP rest: 104/64 BP peak: 146/64 Peak VO2: 16.0 (91% predicted peak VO2) VE/VCO2 slope:  34.6 OUES: 1.90 Peak RER: 1.10 Ventilatory Threshold: 13.5 (76.9% predicted or measured peak VO2) VE/MVV:  64% PETCO2 at peak:  29 O2pulse:  13   (130% predicted O2pulse)  - LHC/RHC (10/06/13)  RA = 11  RV = 54/9/13  PA = 53/27 (38)  PCW = 23  Fick cardiac output/index = 3.8/2.0  Thermo CO/CI = 3.4/1.8  PVR = 3.9 WU (Fick)  FA sat = 98%  PA sat = 62%, 64%  SVC = 59%  Coronaries no significant disease but there was a fistula from small D1 => PA.   - CPX (10/25/13)  Peak VO2: 15.6 (81.2% predicted peak VO2) VE/VCO2 slope: 36.9 OUES: 1.25 Peak RER: 1.08  - CPX (05/24/14) FVC 3.05 (92%)  FEV1 2.23 (88%)  FEV1/FVC 73%  MVV 92 (100%) Resting HR: 64 Peak HR: 132 (85% age predicted max HR) BP rest: 108/70 BP peak: 130/66  Peak VO2: 16.4 (81.1% predicted peak VO2) VE/VCO2 slope: 33.6 OUES: 1.74 Peak RER: 1.16 Ventilatory Threshold: 12.5 (61.8% predicted peak VO2) VE/MVV: 62% PETCO2 at peak: 31 O2pulse: 12 (109% predicted O2pulse)  Blood type A+   FHX: Had older brother with HF in his 62s. No strong FHx of HF.   SocHx: Retired. Non-smoker. Rare ETOH. 3 grown kids.   Review of systems complete and found to be negative unless listed in HPI.    Past Medical History:  Diagnosis Date   Allergy    Arthritis    Breast CA (HCC)    twice first on L breast 1982 w/ chest wall involvment, then a second breast ca on the R in 1987   Chronic systolic CHF (congestive heart failure) (HCC)    a. due to Adriamycin,s/p ICD;  b. 01/2013 Gen change and new LV lead - BSX  [employer] CRT-D BiV ICD, Ser # [id number]   Depression    Diverticulitis    Early menopause    early 30s   Fatigue 02/20/2016   Glaucoma suspect of both eyes    Dr. Lonni Ferrier   Heart murmur    Hyperlipidemia    Nonischemic cardiomyopathy (HCC)    OSA (obstructive sleep apnea) 02/07/2016   Osteopenia    Renal calculus    Snoring 02/20/2016   TIA (transient ischemic attack)     Past Surgical History:  Procedure Laterality Date   BI-VENTRICULAR IMPLANTABLE CARDIOVERTER DEFIBRILLATOR UPGRADE N/A 02/04/2013   Procedure: BI-VENTRICULAR IMPLANTABLE CARDIOVERTER DEFIBRILLATOR UPGRADE;  Surgeon: Danelle LELON Birmingham, MD;  Location: [facility] CATH LAB;  Service: Cardiovascular;  Laterality: N/A;   BIV ICD GENERATOR CHANGEOUT N/A 02/06/2023   Procedure: BIV ICD GENERATOR CHANGEOUT;  Surgeon: Fernande Elspeth BROCKS, MD;  Location: [facility] INVASIVE CV LAB;  Service: Cardiovascular;  Laterality: N/A;   CARDIAC DEFIBRILLATOR PLACEMENT     AICD Replaced-5/09 and 2014   LEFT AND RIGHT HEART CATHETERIZATION WITH CORONARY ANGIOGRAM N/A 10/06/2013   Procedure: LEFT AND RIGHT HEART CATHETERIZATION WITH CORONARY ANGIOGRAM;  Surgeon: Toribio JONELLE Fuel, MD;  Location: [facility] CATH LAB;  Service: Cardiovascular;  Laterality: N/A;   LITHOTRIPSY     h/o several procedures    MASTECTOMY Bilateral    OOPHORECTOMY Bilateral    triger finger, R ring finger Right 02/2019   VENOGRAM N/A 10/20/2012   Procedure: VENOGRAM;  Surgeon: Elspeth BROCKS Fernande, MD;  Location: [facility] CATH LAB;  Service: Cardiovascular;  Laterality: N/A;    Current Outpatient Medications  Medication Sig Dispense Refill   amoxicillin  (AMOXIL ) 500 MG capsule Take 4 capsules 30 minutes before dental proedure. 4 capsule 3   atorvastatin  (LIPITOR) 40 MG tablet Take 1 tablet (40 mg total) by mouth daily. 90 tablet 1   carvedilol  (COREG ) 12.5 MG tablet Take 1 tablet by mouth in the morning and take 1 tablet at night 180 tablet 3   cholecalciferol 25 MCG (1000 UT) tablet Take  2,000 Units by mouth daily.     clopidogrel  (PLAVIX ) 75 MG tablet Take 1 tablet (75 mg total) by mouth daily. 90 tablet 3   fexofenadine  (ALLEGRA  ALLERGY) 60 MG tablet Take 1 tablet (60 mg total) by mouth 2 (two) times daily.     furosemide  (LASIX ) 40 MG tablet Take 2 tablets (80 mg total) by mouth in the morning AND 1.5 tablets (60 mg total) every  evening. 270 tablet 3   gabapentin  (NEURONTIN ) 300 MG capsule Take 1 capsule (300 mg total) by mouth daily as needed. 90 capsule 1   Multiple Vitamin (MULTIVITAMIN) capsule Take 1 capsule by mouth daily. Focus factor     Multiple Vitamins-Minerals (ICAPS AREDS 2 PO) Take 1 tablet by mouth daily.     Omega 3 1000 MG CAPS Take 1,000 mg by mouth daily.     pantoprazole  (PROTONIX ) 40 MG tablet Take 1 tablet (40 mg total) by mouth daily. 90 tablet 1   Polyvinyl Alcohol-Povidone PF (REFRESH) 1.4-0.6 % SOLN Place 1 drop into both eyes daily as needed (Dry eyes).     potassium chloride  (KLOR-CON  M) 10 MEQ tablet Take 4 tablets (40 mEq total) by mouth daily. 360 tablet 3   sacubitril -valsartan  (ENTRESTO ) 24-26 MG Take 1 tablet by mouth 2 (two) times daily. 180 tablet 3   sertraline  (ZOLOFT ) 25 MG tablet Take 1 tablet (25 mg total) by mouth daily. 90 tablet 1   spironolactone  (ALDACTONE ) 25 MG tablet Take 1 tablet (25 mg total) by mouth daily. 90 tablet 3   tiZANidine  (ZANAFLEX ) 2 MG tablet Take 1 tablet (2 mg total) by mouth every 8 (eight) hours as needed for muscle spasms. 30 tablet 0   triamcinolone  cream (KENALOG ) 0.1 % Apply 1 Application topically 2 (two) times daily. 30 g 0   TURMERIC PO Take 1,000 mg by mouth 2 (two) times daily.     zinc gluconate 50 MG tablet Take 50 mg by mouth daily.     zolpidem  (AMBIEN ) 10 MG tablet TAKE ONE-HALF TO ONE TABLET BY MOUTH DAILY AT BEDTIME AS NEEDED FOR SLEEP 90 tablet 0   No current facility-administered medications for this encounter.   Allergies  Allergen Reactions   Atacand [Candesartan] Other (See Comments)     Causes fatigue    Dapagliflozin     Details not clear   Keflex [Cephalexin] Hives and Itching   Latex Rash   BP 102/60   Pulse (!) 57   Wt 80.1 kg (176 lb 9.6 oz)   SpO2 96%   BMI 30.31 kg/m   Wt Readings from Last 3 Encounters:  12/30/23 80.1 kg (176 lb 9.6 oz)  11/02/23 77.7 kg (171 lb 4 oz)  09/22/23 78.7 kg (173 lb 8 oz)   Physical Exam General:  Well appearing. No resp difficulty HEENT: normal Neck: supple. no JVD. Carotids 2+ bilat; no bruits. No lymphadenopathy or thryomegaly appreciated. Cor: PMI nondisplaced. Regular rate & rhythm. 2/6 AI Lungs: clear Abdomen: soft, nontender, nondistended. No hepatosplenomegaly. No bruits or masses. Good bowel sounds. Extremities: no cyanosis, clubbing, rash, edema Neuro: alert & orientedx3, cranial nerves grossly intact. moves all 4 extremities w/o difficulty. Affect pleasant  ECG Sinus brady 58 LV paced. Personally reviewed    Device interrogation (personally reviewed): HL score, 1.5 -> 2.9 hr/day activity, no VT/AF 100% LV pacedPersonally reviewed   Assessment and  Plan  1. Chronic systolic HF:  - Nonischemic cardiomyopathy, possibly anthracycline cardiotoxicity.   - LHC 11/14 without significant coronary disease.  - Has [employer] CRT-D.  - Echo 11.14 EF 10-15%. Echo at [facility] 4/16 EF 15% moderate AI.  - Echo 9/17 EF 20-25%.  RV ok. Severe MR mild AI/AS  Seen in [facility] and will follow prn - Echo 6/18 LVEF 20-25%, Grade 1 DD. - Echo 12/19 EF 20-25% RV ok - Echo 6/21 EF improved to 40-45% - Echo 2022 EF 40-45% Mild AS Moderate  AI and moderate functional MR - Echo 7/24 EF 45% mild MR moderate AI - NYHA II. Volume ok  - Continue Lasix  80/60 + 30 KCL - Did not tolerate Jardiance  - Continue Entresto  24/26 mg bid. Previously decreased due to orthostasis - Continue carvedilol  12.5 mg bid. - Continue spironolactone  25 mg daily. - Labs today   2. Valvular heart disease - Echo 07/2021 mild AS mod AI  and moderate functional MR - Echo 7/24 EF 45% mild MR moderate AI - Discussed SBE prophylaxis - Continue to follow with serial echos  3. h/o bilateral breast CA in 1982 and 1987 (treated with Adriamycin in 1982) - stable.  - No change  4. ICD  - Follows with Dr. Fernande. - Interrogation as above  5. h/o TIA.  - Continue Plavix  and statin.  - No recent Neuro symptoms. No change   Toribio Fuel, MD 12/30/2023

## 2023-12-30 NOTE — Addendum Note (Signed)
 Encounter addended by: Glorietta Lark, RN on: 12/30/2023 10:38 AM  Actions taken: Order list changed, Diagnosis association updated, Clinical Note Signed

## 2024-01-06 ENCOUNTER — Encounter: Payer: Self-pay | Admitting: Internal Medicine

## 2024-01-12 ENCOUNTER — Ambulatory Visit (HOSPITAL_BASED_OUTPATIENT_CLINIC_OR_DEPARTMENT_OTHER)
Admission: RE | Admit: 2024-01-12 | Discharge: 2024-01-12 | Disposition: A | Discharge status: Home / Self Care | Payer: Medicare Other | Source: Ambulatory Visit | Attending: Medical | Admitting: Medical

## 2024-01-12 ENCOUNTER — Encounter: Payer: Self-pay | Admitting: Medical

## 2024-01-12 ENCOUNTER — Ambulatory Visit (INDEPENDENT_AMBULATORY_CARE_PROVIDER_SITE_OTHER): Payer: Medicare Other | Admitting: Medical

## 2024-01-12 VITALS — BP 110/60 | HR 70 | Temp 98.6°F | Resp 18 | Ht 64.0 in | Wt 176.0 lb

## 2024-01-12 DIAGNOSIS — R062 Wheezing: Secondary | ICD-10-CM | POA: Diagnosis not present

## 2024-01-12 DIAGNOSIS — R5383 Other fatigue: Secondary | ICD-10-CM

## 2024-01-12 DIAGNOSIS — J069 Acute upper respiratory infection, unspecified: Secondary | ICD-10-CM | POA: Diagnosis not present

## 2024-01-12 DIAGNOSIS — R059 Cough, unspecified: Secondary | ICD-10-CM | POA: Insufficient documentation

## 2024-01-12 DIAGNOSIS — R058 Other specified cough: Secondary | ICD-10-CM | POA: Diagnosis not present

## 2024-01-12 DIAGNOSIS — R0981 Nasal congestion: Secondary | ICD-10-CM | POA: Diagnosis not present

## 2024-01-12 DIAGNOSIS — Z95810 Presence of automatic (implantable) cardiac defibrillator: Secondary | ICD-10-CM | POA: Diagnosis not present

## 2024-01-12 LAB — COMPREHENSIVE METABOLIC PANEL
ALT: 26 U/L (ref 0–35)
AST: 25 U/L (ref 0–37)
Albumin: 3.7 g/dL (ref 3.5–5.2)
Alkaline Phosphatase: 91 U/L (ref 39–117)
BUN: 11 mg/dL (ref 6–23)
CO2: 29 meq/L (ref 19–32)
Calcium: 9.3 mg/dL (ref 8.4–10.5)
Chloride: 103 meq/L (ref 96–112)
Creatinine, Ser: 0.9 mg/dL (ref 0.40–1.20)
GFR: 62.94 mL/min (ref >60.00)
Glucose, Bld: 103 mg/dL — ABNORMAL HIGH (ref 70–99)
Potassium: 4 meq/L (ref 3.5–5.1)
Sodium: 138 meq/L (ref 135–145)
Total Bilirubin: 0.3 mg/dL (ref 0.2–1.2)
Total Protein: 6.4 g/dL (ref 6.0–8.3)

## 2024-01-12 LAB — CBC WITH DIFFERENTIAL/PLATELET
Basophils Absolute: 0 10*3/uL (ref 0.0–0.1)
Basophils Relative: 0.5 % (ref 0.0–3.0)
Eosinophils Absolute: 0 10*3/uL (ref 0.0–0.7)
Eosinophils Relative: 0.6 % (ref 0.0–5.0)
HCT: 41 % (ref 36.0–46.0)
Hemoglobin: 13.5 g/dL (ref 12.0–15.0)
Lymphocytes Relative: 46 % (ref 12.0–46.0)
Lymphs Abs: 1.8 10*3/uL (ref 0.7–4.0)
MCHC: 33 g/dL (ref 30.0–36.0)
MCV: 92.9 fL (ref 78.0–100.0)
Monocytes Absolute: 0.6 10*3/uL (ref 0.1–1.0)
Monocytes Relative: 15.3 % — ABNORMAL HIGH (ref 3.0–12.0)
Neutro Abs: 1.5 10*3/uL (ref 1.4–7.7)
Neutrophils Relative %: 37.6 % — ABNORMAL LOW (ref 43.0–77.0)
Platelets: 206 10*3/uL (ref 150.0–400.0)
RBC: 4.41 Mil/uL (ref 3.87–5.11)
RDW: 14.1 % (ref 11.5–15.5)
WBC: 3.9 10*3/uL — ABNORMAL LOW (ref 4.0–10.5)

## 2024-01-12 LAB — POCT INFLUENZA A/B
Influenza A, POC: NEGATIVE
Influenza B, POC: NEGATIVE

## 2024-01-12 LAB — POC COVID19 BINAXNOW: SARS Coronavirus 2 Ag: NEGATIVE

## 2024-01-12 MED ORDER — FLUTICASONE PROPIONATE 50 MCG/ACT NA SUSP: 2.0000 | Freq: Every day | NASAL | 1 refills | Status: AC

## 2024-01-12 MED ORDER — AZITHROMYCIN 250 MG PO TABS: ORAL_TABLET | ORAL | 0 refills | Status: AC

## 2024-01-12 MED ORDER — HYDROCODONE BIT-HOMATROP MBR 5-1.5 MG/5ML PO SOLN: 5.0000 mL | Freq: Three times a day (TID) | ORAL | 0 refills | Status: AC | PRN

## 2024-01-12 MED ORDER — ALBUTEROL SULFATE HFA 108 (90 BASE) MCG/ACT IN AERS: 2.0000 | INHALATION_SPRAY | Freq: Four times a day (QID) | RESPIRATORY_TRACT | 0 refills | Status: AC | PRN

## 2024-01-12 NOTE — Patient Instructions (Addendum)
Upper Respiratory Infection Symptoms of cough, congestion, and fatigue since Friday. Cough productive of yellowish mucus at times. Negative rapid flu and COVID tests. No sinus tenderness. Wheezing noted, more prominent when lying down. -Order chest X-ray and CBC/metabolic panel. -Prescribe Flonase for nasal congestion. -Prescribe Hycodan cough syrup for cough. -Prescribe Albuterol inhaler for wheezing, to be used as needed. -Send in prescription for Azithromycin to be used if symptoms worsen or if sinus pressure or ear pain develops. Will let you know  if need to start today after cxr.  Lower bp. May have mild dehydration(has not taken cardiac meds yet today) Reported blood pressure of 74/50 at home, improved with hydration. Highest reading at home was 92/55. BP little lower.  -Advise to continue hydration, preferably with sugar-free Gatorade or Propal. -Check blood pressure in office. -continue enteresto. -will advise on diuretics after lab review.  Follow-up in 5-7 days or sooner if symptoms worsen. Update patient on lab and X-ray results.

## 2024-01-12 NOTE — Progress Notes (Signed)
Subjective:    Patient ID: [name], female    DOB: 1951/07/10, 73 y.o.   MRN: [id number]  HPI  Discussed the use of AI scribe software for clinical note transcription with the patient, who gave verbal consent to proceed.  History of Present Illness   [name] is a 73 year old female who presents with respiratory symptoms and hypotension.  She has been feeling unwell since Friday, with symptoms worsening over the weekend. Initially, she experienced a runny nose, coughing, and congestion starting Saturday. By Sunday, she felt extremely fatigued and stayed in bed all day. The fatigue is severe but not associated with body aches.  On Monday, she experienced a significant drop in blood pressure, recorded at 74/50 mmHg, which persisted for several hours. She managed to increase her blood pressure by drinking water, reaching 92/50 mmHg by bedtime. Today, her blood pressure was 122/55 mmHg. She uses a fairly new home blood pressure machine.  She reports a productive cough with mucus that has varied in color from yellowish to clear. The cough is severe enough to cause headaches and disrupt her sleep. She has been using benzonatate for the cough, which she finds ineffective, and mentions that a hydrocodone-based cough syrup prescribed previously was more effective.  No sinus pain but notes occasional wheezing, particularly noticeable when lying down quietly. She has a history of using inhalers for wheezing, though not recently. No smoking history.  She has not been taking any over-the-counter medications except for the benzonatate. She has a history of using Flonase for nasal congestion and is currently on potassium tablets.         Review of Systems  Constitutional:  Positive for fatigue. Negative for chills.  HENT:  Positive for congestion. Negative for ear pain, sinus pressure and sneezing.   Respiratory:  Positive for cough and wheezing.   Cardiovascular:  Negative for  chest pain and palpitations.  Gastrointestinal:  Negative for abdominal pain.  Musculoskeletal:  Negative for back pain and neck pain.  Skin:  Negative for wound.  Psychiatric/Behavioral:  Negative for behavioral problems and dysphoric mood.     Past Medical History:  Diagnosis Date   Allergy    Arthritis    Breast CA (HCC)    twice first on L breast 1982 w/ chest wall involvment, then a second breast ca on the R in 1987   Chronic systolic CHF (congestive heart failure) (HCC)    a. due to Adriamycin,s/p ICD;  b. 01/2013 Gen change and new LV lead - BSX Energen CRT-D BiV ICD, Ser # 789381   Depression    Diverticulitis    Early menopause    early 30s   Fatigue 02/20/2016   Glaucoma suspect of both eyes    Dr. 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  Tobacco Use   Smoking status: Never   Smokeless tobacco: Never  Vaping  Use   Vaping status: Never Used  Substance and Sexual Activity   Alcohol use: Yes    Comment: socially/occassionally   Drug use: No   Sexual activity: Not on file  Other Topics Concern   Not on file  Social History Narrative   Related to Mr and [name], they are my patients as well    Married, 3 children all in [place], 8 Gkids     Siblings, 2 deceased, 2 living brothers    Social Drivers of Corporate investment banker Strain: Low Risk  (03/06/2023)   Overall Financial Resource Strain (CARDIA)    Difficulty of Paying Living Expenses: Not hard at all  Food  Insecurity: No Food Insecurity (03/06/2023)   Hunger Vital Sign    Worried About Running Out of Food in the Last Year: Never true    Ran Out of Food in the Last Year: Never true  Transportation Needs: No Transportation Needs (03/06/2023)   PRAPARE - Administrator, Civil Service (Medical): No    Lack of Transportation (Non-Medical): No  Physical Activity: Insufficiently Active (03/06/2023)   Exercise Vital Sign    Days of Exercise per Week: 3 days    Minutes of Exercise per Session: 20 min  Stress: No Stress Concern Present (03/06/2023)   [employer] of Occupational Health - Occupational Stress Questionnaire    Feeling of Stress : Not at all  Social Connections: Socially Integrated (03/06/2023)   Social Connection and Isolation Panel [NHANES]    Frequency of Communication with Friends and Family: More than three times a week    Frequency of Social Gatherings with Friends and Family: More than three times a week    Attends Religious Services: More than 4 times per year    Active Member of Clubs or Organizations: Yes    Attends Banker Meetings: More than 4 times per year    Marital Status: Married  Catering manager Violence: Not At Risk (03/06/2023)   Humiliation, Afraid, Rape, and Kick questionnaire    Fear of Current or Ex-Partner: No    Emotionally Abused: No    Physically Abused: No    Sexually Abused: No    Past Surgical History:  Procedure Laterality Date   BI-VENTRICULAR IMPLANTABLE CARDIOVERTER DEFIBRILLATOR UPGRADE N/A 02/04/2013   Procedure: BI-VENTRICULAR IMPLANTABLE CARDIOVERTER DEFIBRILLATOR UPGRADE;  Surgeon: Marinus Maw, MD;  Location: [facility] CATH LAB;  Service: Cardiovascular;  Laterality: N/A;   BIV ICD GENERATOR CHANGEOUT N/A 02/06/2023   Procedure: BIV ICD GENERATOR CHANGEOUT;  Surgeon: Duke Salvia, MD;  Location: [facility] INVASIVE CV LAB;  Service: Cardiovascular;  Laterality: N/A;   CARDIAC DEFIBRILLATOR PLACEMENT     AICD Replaced-5/09 and  2014   LEFT AND RIGHT HEART CATHETERIZATION WITH CORONARY ANGIOGRAM N/A 10/06/2013   Procedure: LEFT AND RIGHT HEART CATHETERIZATION WITH CORONARY ANGIOGRAM;  Surgeon: Dolores Patty, MD;  Location: [facility] CATH LAB;  Service: Cardiovascular;  Laterality: N/A;   LITHOTRIPSY     h/o several procedures    MASTECTOMY Bilateral    OOPHORECTOMY Bilateral    triger finger, R ring finger Right 02/2019   VENOGRAM N/A 10/20/2012   Procedure: VENOGRAM;  Surgeon: Duke Salvia, MD;  Location: [facility] CATH LAB;  Service: Cardiovascular;  Laterality: N/A;    Family History  Problem Relation Age of Onset   Breast cancer Mother        M and sister    Lung cancer Father    Breast  cancer Sister    Kidney cancer Brother    Breast cancer Maternal Grandmother    Heart attack Neg Hx    Diabetes Neg Hx    Colon cancer Neg Hx    Esophageal cancer Neg Hx    Stomach cancer Neg Hx     Allergies  Allergen Reactions   Atacand [Candesartan] Other (See Comments)    Causes fatigue    Dapagliflozin     Details not clear   Keflex [Cephalexin] Hives and Itching   Latex Rash    Current Outpatient Medications on File Prior to Visit  Medication Sig Dispense Refill   atorvastatin (LIPITOR) 40 MG tablet Take 1 tablet (40 mg total) by mouth daily. 90 tablet 1   carvedilol (COREG) 12.5 MG tablet Take 1 tablet by mouth in the morning and take 1 tablet at night 180 tablet 3   cholecalciferol 25 MCG (1000 UT) tablet Take 2,000 Units by mouth daily.     clopidogrel (PLAVIX) 75 MG tablet Take 1 tablet (75 mg total) by mouth daily. 90 tablet 3   fexofenadine (ALLEGRA ALLERGY) 60 MG tablet Take 1 tablet (60 mg total) by mouth 2 (two) times daily.     furosemide (LASIX) 40 MG tablet Take 2 tablets (80 mg total) by mouth in the morning AND 1.5 tablets (60 mg total) every evening. 270 tablet 3   gabapentin (NEURONTIN) 300 MG capsule Take 1 capsule (300 mg total) by mouth daily as needed. 90 capsule 1   Multiple Vitamin  (MULTIVITAMIN) capsule Take 1 capsule by mouth daily. Focus factor     Multiple Vitamins-Minerals (ICAPS AREDS 2 PO) Take 1 tablet by mouth daily.     Omega 3 1000 MG CAPS Take 1,000 mg by mouth daily.     pantoprazole (PROTONIX) 40 MG tablet Take 1 tablet (40 mg total) by mouth daily. 90 tablet 1   Polyvinyl Alcohol-Povidone PF (REFRESH) 1.4-0.6 % SOLN Place 1 drop into both eyes daily as needed (Dry eyes).     potassium chloride (KLOR-CON M) 10 MEQ tablet Take 4 tablets (40 mEq total) by mouth daily. 360 tablet 3   sacubitril-valsartan (ENTRESTO) 24-26 MG Take 1 tablet by mouth 2 (two) times daily. 180 tablet 3   sertraline (ZOLOFT) 25 MG tablet Take 1 tablet (25 mg total) by mouth daily. 90 tablet 1   spironolactone (ALDACTONE) 25 MG tablet Take 1 tablet (25 mg total) by mouth daily. 90 tablet 3   tiZANidine (ZANAFLEX) 2 MG tablet Take 1 tablet (2 mg total) by mouth every 8 (eight) hours as needed for muscle spasms. 30 tablet 0   triamcinolone cream (KENALOG) 0.1 % Apply 1 Application topically 2 (two) times daily. 30 g 0   TURMERIC PO Take 1,000 mg by mouth 2 (two) times daily.     zinc gluconate 50 MG tablet Take 50 mg by mouth daily.     zolpidem (AMBIEN) 10 MG tablet TAKE ONE-HALF TO ONE TABLET BY MOUTH DAILY AT BEDTIME AS NEEDED FOR SLEEP 90 tablet 0   No current facility-administered medications on file prior to visit.    BP 110/60   Pulse 70   Temp 98.6 F (37 C)   Resp 18   Ht 5\' 4"  (1.626 m)   Wt 176 lb (79.8 kg)   SpO2 96%   BMI 30.21 kg/m        Objective:   Physical Exam  General- No acute distress. Pleasant patient. Neck- Full range of motion,  no jvd Lungs- even and unlabored.(Minimal faint rough breath sounds mid lunch field bilaterally) Heart- regular rate and rhythm. Neurologic- CNII- XII grossly intact.  Heent-no sinus pressure, canals clear and normal tms.  Lower ext- no pedal edema. Negative homans signs.      Assessment & Plan:   Patient  Instructions  Upper Respiratory Infection Symptoms of cough, congestion, and fatigue since Friday. Cough productive of yellowish mucus at times. Negative rapid flu and COVID tests. No sinus tenderness. Wheezing noted, more prominent when lying down. -Order chest X-ray and CBC/metabolic panel. -Prescribe Flonase for nasal congestion. -Prescribe Hycodan cough syrup for cough. -Prescribe Albuterol inhaler for wheezing, to be used as needed. -Send in prescription for Azithromycin to be used if symptoms worsen or if sinus pressure or ear pain develops. Will let you know  if need to start today after cxr.  Lower bp. May have mild dehydration Reported blood pressure of 74/50 at home, improved with hydration. Highest reading at home was 92/55. BP little lower.  -Advise to continue hydration, preferably with sugar-free Gatorade or Propal. -Check blood pressure in office. -continue enteresto. -will advise on diuretics after lab review.  Follow-up in 5-7 days or sooner if symptoms worsen. Update patient on lab and X-ray results.

## 2024-01-13 ENCOUNTER — Encounter: Payer: Self-pay | Admitting: Medical

## 2024-01-24 ENCOUNTER — Telehealth: Payer: Self-pay | Admitting: Internal Medicine

## 2024-01-25 NOTE — Telephone Encounter (Signed)
 Requesting: Ambien 10mg   Contract: 02/13/23 UDS: Ambien only Last Visit: 11/02/23 Next Visit: 02/15/24 Last Refill: 10/07/23 #90 and 0RF   Please Advise

## 2024-01-25 NOTE — Telephone Encounter (Signed)
PDMP okay, prescription sent 

## 2024-02-01 ENCOUNTER — Other Ambulatory Visit (HOSPITAL_COMMUNITY): Payer: Self-pay | Admitting: Internal Medicine

## 2024-02-08 ENCOUNTER — Ambulatory Visit: Payer: Medicare Other

## 2024-02-08 DIAGNOSIS — I5022 Chronic systolic (congestive) heart failure: Secondary | ICD-10-CM | POA: Diagnosis not present

## 2024-02-08 DIAGNOSIS — I428 Other cardiomyopathies: Secondary | ICD-10-CM

## 2024-02-10 LAB — CUP PACEART REMOTE DEVICE CHECK
Battery Remaining Longevity: 120 mo
Battery Remaining Percentage: 100 %
Brady Statistic RA Percent Paced: 0 %
Brady Statistic RV Percent Paced: 100 %
Date Time Interrogation Session: 20250317010200
HighPow Impedance: 58 Ohm
Implantable Lead Connection Status: 753985
Implantable Lead Connection Status: 753985
Implantable Lead Connection Status: 753985
Implantable Lead Implant Date: 20050701
Implantable Lead Implant Date: 20050701
Implantable Lead Implant Date: 20140314
Implantable Lead Location: 753858
Implantable Lead Location: 753859
Implantable Lead Location: 753860
Implantable Lead Model: 158
Implantable Lead Model: 4196
Implantable Lead Model: 5076
Implantable Lead Serial Number: 156416
Implantable Pulse Generator Implant Date: 20240315
Lead Channel Impedance Value: 489 Ohm
Lead Channel Impedance Value: 536 Ohm
Lead Channel Impedance Value: 687 Ohm
Lead Channel Pacing Threshold Amplitude: 0.5 V
Lead Channel Pacing Threshold Amplitude: 1.2 V
Lead Channel Pacing Threshold Pulse Width: 0.4 ms
Lead Channel Pacing Threshold Pulse Width: 0.4 ms
Lead Channel Setting Pacing Amplitude: 2 V
Lead Channel Setting Pacing Amplitude: 2 V
Lead Channel Setting Pacing Amplitude: 2 V
Lead Channel Setting Pacing Pulse Width: 0.4 ms
Lead Channel Setting Pacing Pulse Width: 0.6 ms
Lead Channel Setting Sensing Sensitivity: 0.6 mV
Lead Channel Setting Sensing Sensitivity: 1 mV
Pulse Gen Serial Number: 159739

## 2024-02-15 ENCOUNTER — Ambulatory Visit (INDEPENDENT_AMBULATORY_CARE_PROVIDER_SITE_OTHER): Payer: Medicare Other | Admitting: Internal Medicine

## 2024-02-15 ENCOUNTER — Encounter: Payer: Self-pay | Admitting: Internal Medicine

## 2024-02-15 VITALS — BP 118/68 | HR 68 | Temp 97.8°F | Resp 16 | Ht 64.0 in | Wt 177.4 lb

## 2024-02-15 DIAGNOSIS — Z0001 Encounter for general adult medical examination with abnormal findings: Secondary | ICD-10-CM

## 2024-02-15 DIAGNOSIS — I5022 Chronic systolic (congestive) heart failure: Secondary | ICD-10-CM

## 2024-02-15 DIAGNOSIS — Z0000 Encounter for general adult medical examination without abnormal findings: Secondary | ICD-10-CM | POA: Diagnosis not present

## 2024-02-15 DIAGNOSIS — E559 Vitamin D deficiency, unspecified: Secondary | ICD-10-CM | POA: Diagnosis not present

## 2024-02-15 DIAGNOSIS — R739 Hyperglycemia, unspecified: Secondary | ICD-10-CM

## 2024-02-15 LAB — VITAMIN D 25 HYDROXY (VIT D DEFICIENCY, FRACTURES): VITD: 46 ng/mL (ref 30.00–100.00)

## 2024-02-15 LAB — HEMOGLOBIN A1C: Hgb A1c MFr Bld: 6.3 % (ref 4.6–6.5)

## 2024-02-15 NOTE — Patient Instructions (Addendum)
 Vaccines I recommend: Shingrix (shingles) RSV  Mucinex DM as needed for cough  Continue checking your blood pressure regularly If your blood pressure is low: Skip one  dose of Lasix and spironolactone.  If the blood pressure does not return to normal or you continue with fatigue let Nayyar your heart doctors know     GO TO THE LAB : Get the blood work     Please go to the front desk: Arrange for a follow-up in 6 months       "Health Care Power of attorney" (Also know as a  "Living will" or  Advance care planning documents)  If you already have a living will or healthcare power of attorney, is recommended you bring the copy to be scanned in your chart.   The document will be available to all the doctors you see in the system.  If you are over 80 y/o and don't have the document, please read:  Advance care planning is a process that supports adults in  understanding and sharing their preferences regarding future medical care.  The patient's preferences are recorded in documents called Advance Directives and the can be modified at any time while the patient is in full mental capacity.     More information at: [email or web address]

## 2024-02-15 NOTE — Progress Notes (Signed)
 Subjective:    Patient ID: [name], female    DOB: December 15, 1950, 73 y.o.   MRN: [id number]  DOS:  02/15/2024 Type of visit - description: CPX  Here for CPX Was seen with upper respiratory symptoms 01/12/2024; influenza, COVID test and chest x-ray were essentially negative. Status post Zithromax, feeling better, still has some mild lingering cough with no sputum.  Review of Systems  Other than above, a 14 point review of systems is negative     Past Medical History:  Diagnosis Date   Allergy    Arthritis    Breast CA (HCC)    twice first on L breast 1982 w/ chest wall involvment, then a second breast ca on the R in 1987   Chronic systolic CHF (congestive heart failure) (HCC)    a. due to Adriamycin,s/p ICD;  b. 01/2013 Gen change and new LV lead - BSX Energen CRT-D BiV ICD, Ser # [id number]   Depression    Diverticulitis    Early menopause    early 30s   Fatigue 02/20/2016   Glaucoma suspect of both eyes    Dr. Wynell Balloon   Heart murmur    Hyperlipidemia    Nonischemic cardiomyopathy (HCC)    OSA (obstructive sleep apnea) 02/07/2016   Osteopenia    Renal calculus    Snoring 02/20/2016   TIA (transient ischemic attack)     Past Surgical History:  Procedure Laterality Date   BI-VENTRICULAR IMPLANTABLE CARDIOVERTER DEFIBRILLATOR UPGRADE N/A 02/04/2013   Procedure: BI-VENTRICULAR IMPLANTABLE CARDIOVERTER DEFIBRILLATOR UPGRADE;  Surgeon: Marinus Maw, MD;  Location: [facility] CATH LAB;  Service: Cardiovascular;  Laterality: N/A;   BIV ICD GENERATOR CHANGEOUT N/A 02/06/2023   Procedure: BIV ICD GENERATOR CHANGEOUT;  Surgeon: Duke Salvia, MD;  Location: [facility] INVASIVE CV LAB;  Service: Cardiovascular;  Laterality: N/A;   CARDIAC DEFIBRILLATOR PLACEMENT     AICD Replaced-5/09 and 2014   LEFT AND RIGHT HEART CATHETERIZATION WITH CORONARY ANGIOGRAM N/A 10/06/2013   Procedure: LEFT AND RIGHT HEART CATHETERIZATION WITH CORONARY ANGIOGRAM;  Surgeon: Dolores Patty, MD;   Location: [facility] CATH LAB;  Service: Cardiovascular;  Laterality: N/A;   LITHOTRIPSY     h/o several procedures    MASTECTOMY Bilateral    OOPHORECTOMY Bilateral    triger finger, R ring finger Right 02/2019   VENOGRAM N/A 10/20/2012   Procedure: VENOGRAM;  Surgeon: Duke Salvia, MD;  Location: [facility] CATH LAB;  Service: Cardiovascular;  Laterality: N/A;   Social History   Socioeconomic History   Marital status: Married    Spouse name: Linde Gillis   Number of children: 3   Years of education: Not on file   Highest education level: Not on file  Occupational History   Occupation: RETIRED---pre Surveyor, 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  Tobacco Use   Smoking status: Never   Smokeless tobacco: Never  Vaping Use   Vaping status: Never Used  Substance and Sexual Activity   Alcohol use: Yes    Comment: socially/occassionally   Drug use: No   Sexual activity: Not on file  Other Topics Concern   Not on file  Social History Narrative   Related to Mr and [name], they are my patients as well    Married, 3 children all in [place], 8 Gkids     Siblings, 2 deceased, 2 living brothers    Social Drivers of Corporate investment banker Strain: Low Risk  (  03/06/2023)   Overall Financial Resource Strain (CARDIA)    Difficulty of Paying Living Expenses: Not hard at all  Food Insecurity: No Food Insecurity (03/06/2023)   Hunger Vital Sign    Worried About Running Out of Food in the Last Year: Never true    Ran Out of Food in the Last Year: Never true  Transportation Needs: No Transportation Needs (03/06/2023)   PRAPARE - Administrator, Civil Service (Medical): No    Lack of Transportation (Non-Medical): No  Physical Activity: Insufficiently Active (03/06/2023)   Exercise Vital Sign    Days of Exercise per Week: 3 days    Minutes of Exercise per Session: 20 min  Stress: No Stress Concern Present (03/06/2023)   [employer] of Occupational Health -  Occupational Stress Questionnaire    Feeling of Stress : Not at all  Social Connections: Socially Integrated (03/06/2023)   Social Connection and Isolation Panel [NHANES]    Frequency of Communication with Friends and Family: More than three times a week    Frequency of Social Gatherings with Friends and Family: More than three times a week    Attends Religious Services: More than 4 times per year    Active Member of [employer] or Organizations: Yes    Attends Engineer, structural: More than 4 times per year    Marital Status: Married  Catering manager Violence: Not At Risk (03/06/2023)   Humiliation, Afraid, Rape, and Kick questionnaire    Fear of Current or Ex-Partner: No    Emotionally Abused: No    Physically Abused: No    Sexually Abused: No    Current Outpatient Medications  Medication Instructions   albuterol (VENTOLIN HFA) 108 (90 Base) MCG/ACT inhaler 2 puffs, Inhalation, Every 6 hours PRN   atorvastatin (LIPITOR) 40 mg, Oral, Daily   carvedilol (COREG) 12.5 MG tablet Take 1 tablet by mouth in the morning and take 1 tablet at night   cholecalciferol (VITAMIN D3) 2,000 Units, Daily   clopidogrel (PLAVIX) 75 mg, Oral, Daily   fexofenadine (ALLEGRA ALLERGY) 60 mg, Oral, 2 times daily   fluticasone (FLONASE) 50 MCG/ACT nasal spray 2 sprays, Each Nare, Daily   furosemide (LASIX) 40 MG tablet Take 2 tablets (80 mg total) by mouth in the morning AND 1.5 tablets (60 mg total) every evening.   gabapentin (NEURONTIN) 300 mg, Oral, Daily PRN   HYDROcodone bit-homatropine (HYCODAN) 5-1.5 MG/5ML syrup 5 mLs, Oral, Every 8 hours PRN   Multiple Vitamin (MULTIVITAMIN) capsule 1 capsule, Daily   Multiple Vitamins-Minerals (ICAPS AREDS 2 PO) 1 tablet, Daily   Omega 3 1,000 mg, Daily   pantoprazole (PROTONIX) 40 mg, Oral, Daily   Polyvinyl Alcohol-Povidone PF (REFRESH) 1.4-0.6 % SOLN 1 drop, Daily PRN   potassium chloride (KLOR-CON M) 10 MEQ tablet 40 mEq, Oral, Daily    sacubitril-valsartan (ENTRESTO) 24-26 MG 1 tablet, Oral, 2 times daily   sertraline (ZOLOFT) 25 mg, Oral, Daily   spironolactone (ALDACTONE) 25 mg, Oral, Daily   tiZANidine (ZANAFLEX) 2 mg, Oral, Every 8 hours PRN   triamcinolone cream (KENALOG) 0.1 % 1 Application, Topical, 2 times daily   TURMERIC PO 1,000 mg, 2 times daily   zinc gluconate 50 mg, Daily   zolpidem (AMBIEN) 10 MG tablet TAKE ONE-HALF TO ONE TABLET BY MOUTH DAILY AT BEDTIME AS NEEDED FOR SLEEP       Objective:   Physical Exam BP 118/68   Pulse 68   Temp 97.8 F (36.6 C) (  Oral)   Resp 16   Ht 5\' 4"  (1.626 m)   Wt 177 lb 6 oz (80.5 kg)   SpO2 96%   BMI 30.45 kg/m  General: Well developed, NAD, BMI noted Neck: No  thyromegaly  HEENT:  Normocephalic . Face symmetric, atraumatic Lungs:  Few rhonchi with cough Normal respiratory effort, no intercostal retractions, no accessory muscle use. Heart: RRR, + systolic murmur.  Abdomen:  Not distended, soft, non-tender. No rebound or rigidity.   Lower extremities:  Right knee, calf: Larger compared to the left.  Trace pitting edema bilaterally Skin: Exposed areas without rash. Not pale. Not jaundice Neurologic:  alert & oriented X3.  Speech normal, gait appropriate for age and unassisted Strength symmetric and appropriate for age.  Psych: Cognition and judgment appear intact.  Cooperative with normal attention span and concentration.  Behavior appropriate. No anxious or depressed appearing.      Assessment    Assessment Hyperglycemia A1c 5.9 (04/2017) High cholesterol CV:Dr. Graciela Husbands and Bensimohn --CHF, nonischemic cardiomyopathy:due to chemotherapy --MR severe --VT -TIA 05-2017 Depression, insomnia Vitamin D deficiency Kidney stones Breast cancer x 2: first in 1982 with chest wall involvement, 2nd on the R dx  1987, s/p B mastectomy; + BRACA Mild OSA per sleep study 11-2015, saw Dr Mayford Knife 02-20-16: no need for CPAP Osteopenia: DEXA 2016 neg; DEXA 12/2019;   2022: osteopenia, rx vitamin D supplements Post herpetic neuralgia (shingles 2014) , R face-- gaba prn R leg swelling, [place] neg DVT 10/2024 Handicap sticker signed 06-2016   PLAN: Here for CPX -Td 2023 -  pnm 23:  2017;  prevnar:2015.  PNM 20  01/27/2022 - zostavax 2013 - had a flu and covid vax 2024 -  Rec to consider a shingrix, RSV  - CCS,  Cscope 2003, and 11-2011, cscope 06/2022, next per GI -  No further cervical ca screening.  See previous entries - h/o breast cancer, s/p  B mastectomy , self chest examination wnl per  patient. -Labs: A1c, vitamin D. -Follows a healthy diet. - -ACP d/w pt  Other issues addressed: Cough: Mild cough after a URI, s/p Zithromax, recommend Mucinex and observation. Hyperglycemia: Checking A1c High cholesterol: Controlled, continue atorvastatin. CHF, MR: BPs typically okay, once a month BP goes down to 90/50.  Denies chest pain, palpitations.  We agreed to continue carvedilol, Lasix, potassium, Entresto, Aldactone. In the rare event of low BP, recommend to hold 1 dose of Lasix and Aldactone.  Call if not recuperated within 24 hours.  Next visit with cardiology April 11. Vitamin D deficiency: Checking levels, continue OTCs. Neck pain: On Zanaflex as needed. R leg swelling: [place] negative for DVT December 2024 RTC 6 months

## 2024-02-15 NOTE — Assessment & Plan Note (Signed)
 Here for CPX  Other issues addressed: Cough: Mild cough after a URI, s/p Zithromax, recommend Mucinex and observation. Hyperglycemia: Checking A1c High cholesterol: Controlled, continue atorvastatin. CHF, MR: BPs typically okay, once a month BP goes down to 90/50.  Denies chest pain, palpitations.  We agreed to continue carvedilol, Lasix, potassium, Entresto, Aldactone. In the rare event of low BP, recommend to hold 1 dose of Lasix and Aldactone.  Call if not recuperated within 24 hours.  Next visit with cardiology April 11. Vitamin D deficiency: Checking levels, continue OTCs. Neck pain: On Zanaflex as needed. R leg swelling: [place] negative for DVT December 2024 RTC 6 months

## 2024-02-15 NOTE — Assessment & Plan Note (Signed)
 Here for CPX -Td 2023 -  pnm 23:  2017;  prevnar:2015.  PNM 20  01/27/2022 - zostavax 2013 - had a flu and covid vax 2024 -  Rec to consider a shingrix, RSV  - CCS,  Cscope 2003, and 11-2011, cscope 06/2022, next per GI -  No further cervical ca screening.  See previous entries - h/o breast cancer, s/p  B mastectomy , self chest examination wnl per  patient. -Labs: A1c, vitamin D. -Follows a healthy diet. - -ACP d/w pt

## 2024-02-17 ENCOUNTER — Encounter: Payer: Self-pay | Admitting: Internal Medicine

## 2024-03-07 ENCOUNTER — Encounter: Payer: Self-pay | Admitting: Internal Medicine

## 2024-03-10 ENCOUNTER — Ambulatory Visit (INDEPENDENT_AMBULATORY_CARE_PROVIDER_SITE_OTHER): Payer: Medicare Other

## 2024-03-10 VITALS — Ht 64.0 in | Wt 177.0 lb

## 2024-03-10 DIAGNOSIS — Z0000 Encounter for general adult medical examination without abnormal findings: Secondary | ICD-10-CM | POA: Diagnosis not present

## 2024-03-10 NOTE — Patient Instructions (Addendum)
 [name] , Thank you for taking time to come for your Medicare Wellness Visit. I appreciate your ongoing commitment to your health goals. Please review the following plan we discussed and let me know if I can assist you in the future.   Referrals/Orders/Follow-Ups/Clinician Recommendations:   This is a list of the screening recommended for you and due dates:  Health Maintenance  Topic Date Due   Zoster (Shingles) Vaccine (1 of 2) 06/13/1999   COVID-19 Vaccine (5 - 2024-25 season) 02/29/2024   Flu Shot  06/24/2024   Medicare Annual Wellness Visit  03/10/2025   Colon Cancer Screening  06/28/2027   DTaP/Tdap/Td vaccine (4 - Td or Tdap) 09/15/2032   Pneumonia Vaccine  Completed   DEXA scan (bone density measurement)  Completed   Hepatitis C Screening  Completed   HPV Vaccine  Aged Out   Meningitis B Vaccine  Aged Out    Advanced directives: (Declined) Advance directive discussed with you today. Even though you declined this today, please call our office should you change your mind, and we can give you the proper paperwork for you to fill out.  Next Medicare Annual Wellness Visit scheduled for next year: Yes

## 2024-03-10 NOTE — Progress Notes (Signed)
 Subjective:   [name] is a 73 y.o. who presents for a Medicare Wellness preventive visit.  Visit Complete: Virtual I connected with  [name] on 03/10/24 by a audio enabled telemedicine application and verified that I am speaking with the correct person using two identifiers.  Patient Location: Home  Provider Location: Home Office  I discussed the limitations of evaluation and management by telemedicine. The patient expressed understanding and agreed to proceed.  Vital Signs: Because this visit was a virtual/telehealth visit, some criteria may be missing or patient reported. Any vitals not documented were not able to be obtained and vitals that have been documented are patient reported.    Persons Participating in Visit: Patient.  AWV Questionnaire: No: Patient Medicare AWV questionnaire was not completed prior to this visit.  Cardiac Risk Factors include: advanced age (>47men, >53 women)     Objective:    Today's Vitals   03/10/24 0926  Weight: 177 lb (80.3 kg)  Height: 5\' 4"  (1.626 m)   Body mass index is 30.38 kg/m.     03/10/2024    9:33 AM 03/06/2023   10:00 AM 02/06/2023   10:09 AM 08/03/2021   10:54 AM 06/02/2019    2:06 PM 11/20/2015    8:06 PM 10/06/2013    9:55 AM  Advanced Directives  Does Patient Have a Medical Advance Directive? No No No No No No Patient does not have advance directive  Would patient like information on creating a medical advance directive? No - Patient declined No - Patient declined Yes (MAU/Ambulatory/Procedural Areas - Information given) No - Patient declined Yes (MAU/Ambulatory/Procedural Areas - Information given) Yes - Educational materials given   Pre-existing out of facility DNR order (yellow form or pink MOST form)       No    Current Medications (verified) Outpatient Encounter Medications as of 03/10/2024  Medication Sig   albuterol (VENTOLIN HFA) 108 (90 Base) MCG/ACT inhaler Inhale 2 puffs into the lungs  every 6 (six) hours as needed.   atorvastatin (LIPITOR) 40 MG tablet Take 1 tablet (40 mg total) by mouth daily.   carvedilol (COREG) 12.5 MG tablet Take 1 tablet by mouth in the morning and take 1 tablet at night   cholecalciferol 25 MCG (1000 UT) tablet Take 2,000 Units by mouth daily.   clopidogrel (PLAVIX) 75 MG tablet Take 1 tablet (75 mg total) by mouth daily.   fexofenadine (ALLEGRA ALLERGY) 60 MG tablet Take 1 tablet (60 mg total) by mouth 2 (two) times daily.   fluticasone (FLONASE) 50 MCG/ACT nasal spray Place 2 sprays into both nostrils daily.   furosemide (LASIX) 40 MG tablet Take 2 tablets (80 mg total) by mouth in the morning AND 1.5 tablets (60 mg total) every evening.   gabapentin (NEURONTIN) 300 MG capsule Take 1 capsule (300 mg total) by mouth daily as needed.   HYDROcodone bit-homatropine (HYCODAN) 5-1.5 MG/5ML syrup Take 5 mLs by mouth every 8 (eight) hours as needed for cough. (Patient not taking: Reported on 02/15/2024)   Multiple Vitamin (MULTIVITAMIN) capsule Take 1 capsule by mouth daily. Focus factor   Multiple Vitamins-Minerals (ICAPS AREDS 2 PO) Take 1 tablet by mouth daily.   Omega 3 1000 MG CAPS Take 1,000 mg by mouth daily.   pantoprazole (PROTONIX) 40 MG tablet Take 1 tablet (40 mg total) by mouth daily.   Polyvinyl Alcohol-Povidone PF (REFRESH) 1.4-0.6 % SOLN Place 1 drop into both eyes daily as needed (Dry eyes).  potassium chloride (KLOR-CON M) 10 MEQ tablet Take 4 tablets (40 mEq total) by mouth daily.   sacubitril-valsartan (ENTRESTO) 24-26 MG Take 1 tablet by mouth 2 (two) times daily.   sertraline (ZOLOFT) 25 MG tablet Take 1 tablet (25 mg total) by mouth daily.   spironolactone (ALDACTONE) 25 MG tablet Take 1 tablet (25 mg total) by mouth daily.   tiZANidine (ZANAFLEX) 2 MG tablet Take 1 tablet (2 mg total) by mouth every 8 (eight) hours as needed for muscle spasms.   triamcinolone cream (KENALOG) 0.1 % Apply 1 Application topically 2 (two) times daily.    TURMERIC PO Take 1,000 mg by mouth 2 (two) times daily.   zinc gluconate 50 MG tablet Take 50 mg by mouth daily.   zolpidem (AMBIEN) 10 MG tablet TAKE ONE-HALF TO ONE TABLET BY MOUTH DAILY AT BEDTIME AS NEEDED FOR SLEEP   No facility-administered encounter medications on file as of 03/10/2024.    Allergies (verified) Atacand [candesartan], Dapagliflozin, Keflex [cephalexin], and Latex   History: Past Medical History:  Diagnosis Date   Allergy    Arthritis    Breast CA (HCC)    twice first on L breast 1982 w/ chest wall involvment, then a second breast ca on the R in 1987   Chronic systolic CHF (congestive heart failure) (HCC)    a. due to Adriamycin,s/p ICD;  b. 01/2013 Gen change and new LV lead - BSX Energen CRT-D BiV ICD, Ser # [id number]   Depression    Diverticulitis    Early menopause    early 30s   Fatigue 02/20/2016   Glaucoma suspect of both eyes    Dr. Wynell Balloon   Heart murmur    Hyperlipidemia    Nonischemic cardiomyopathy (HCC)    OSA (obstructive sleep apnea) 02/07/2016   Osteopenia    Renal calculus    Snoring 02/20/2016   TIA (transient ischemic attack)    Past Surgical History:  Procedure Laterality Date   BI-VENTRICULAR IMPLANTABLE CARDIOVERTER DEFIBRILLATOR UPGRADE N/A 02/04/2013   Procedure: BI-VENTRICULAR IMPLANTABLE CARDIOVERTER DEFIBRILLATOR UPGRADE;  Surgeon: Marinus Maw, MD;  Location: [facility] CATH LAB;  Service: Cardiovascular;  Laterality: N/A;   BIV ICD GENERATOR CHANGEOUT N/A 02/06/2023   Procedure: BIV ICD GENERATOR CHANGEOUT;  Surgeon: Duke Salvia, MD;  Location: [facility] INVASIVE CV LAB;  Service: Cardiovascular;  Laterality: N/A;   CARDIAC DEFIBRILLATOR PLACEMENT     AICD Replaced-5/09 and 2014   LEFT AND RIGHT HEART CATHETERIZATION WITH CORONARY ANGIOGRAM N/A 10/06/2013   Procedure: LEFT AND RIGHT HEART CATHETERIZATION WITH CORONARY ANGIOGRAM;  Surgeon: Dolores Patty, MD;  Location: [facility] CATH LAB;  Service: Cardiovascular;  Laterality: N/A;    LITHOTRIPSY     h/o several procedures    MASTECTOMY Bilateral    OOPHORECTOMY Bilateral    triger finger, R ring finger Right 02/2019   VENOGRAM N/A 10/20/2012   Procedure: VENOGRAM;  Surgeon: Duke Salvia, MD;  Location: [facility] CATH LAB;  Service: Cardiovascular;  Laterality: N/A;   Family History  Problem Relation Age of Onset   Breast cancer Mother        M and sister    Lung cancer Father    Breast cancer Sister    Kidney cancer Brother    Breast cancer Maternal Grandmother    Heart attack Neg Hx    Diabetes Neg Hx    Colon cancer Neg Hx    Esophageal cancer Neg Hx    Stomach cancer Neg Hx  Social History   Socioeconomic History   Marital status: Married    Spouse name: Linde Gillis   Number of children: 3   Years of education: Not on file   Highest education level: Not on file  Occupational History   Occupation: RETIRED---pre Surveyor, 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  Tobacco Use   Smoking status: Never   Smokeless tobacco: Never  Vaping Use   Vaping status: Never Used  Substance and Sexual Activity   Alcohol use: Yes    Comment: socially/occassionally   Drug use: No   Sexual activity: Not on file  Other Topics Concern   Not on file  Social History Narrative   Related to Mr and [name], they are my patients as well    Married, 3 children all in [place], 8 Gkids     Siblings, 2 deceased, 2 living brothers    Social Drivers of Corporate investment banker Strain: Low Risk  (03/10/2024)   Overall Financial Resource Strain (CARDIA)    Difficulty of Paying Living Expenses: Not hard at all  Food Insecurity: No Food Insecurity (03/10/2024)   Hunger Vital Sign    Worried About Running Out of Food in the Last Year: Never true    Ran Out of Food in the Last Year: Never true  Transportation Needs: No Transportation Needs (03/10/2024)   PRAPARE - Administrator, Civil Service (Medical): No    Lack of Transportation (Non-Medical): No   Physical Activity: Insufficiently Active (03/10/2024)   Exercise Vital Sign    Days of Exercise per Week: 3 days    Minutes of Exercise per Session: 30 min  Stress: No Stress Concern Present (03/10/2024)   [employer] of Occupational Health - Occupational Stress Questionnaire    Feeling of Stress : Not at all  Social Connections: Socially Integrated (03/10/2024)   Social Connection and Isolation Panel [NHANES]    Frequency of Communication with Friends and Family: More than three times a week    Frequency of Social Gatherings with Friends and Family: More than three times a week    Attends Religious Services: More than 4 times per year    Active Member of [employer] or Organizations: Yes    Attends Engineer, structural: More than 4 times per year    Marital Status: Married    Tobacco Counseling Counseling given: Not Answered    Clinical Intake:  Pre-visit preparation completed: Yes  Pain : No/denies pain     BMI - recorded: 30.38 Nutritional Status: BMI > 30  Obese Nutritional Risks: None Diabetes: No  Lab Results  Component Value Date   HGBA1C 6.3 02/15/2024   HGBA1C 6.1 08/17/2023   HGBA1C 6.4 02/09/2023     How often do you need to have someone help you when you read instructions, pamphlets, or other written materials from your doctor or pharmacy?: 1 - Never  Interpreter Needed?: No  Information entered by :: Theresa Mulligan LPN   Activities of Daily Living     03/10/2024    9:32 AM  In your present state of health, do you have any difficulty performing the following activities:  Hearing? 0  Vision? 0  Difficulty concentrating or making decisions? 0  Walking or climbing stairs? 0  Dressing or bathing? 0  Doing errands, shopping? 0  Preparing Food and eating ? N  Using the Toilet? N  In the past six months, have you accidently  leaked urine? Y  Comment Followed by PCP  Do you have problems with loss of bowel control? N  Managing your  Medications? N  Managing your Finances? N  Housekeeping or managing your Housekeeping? N    Patient Care Team: Wanda Plump, MD as PCP - General Duke Salvia, MD as PCP - Electrophysiology (Cardiology) Bensimhon, Bevelyn Buckles, MD as Consulting Physician (Cardiology) Samson Frederic, MD as Consulting Physician (Orthopedic Surgery) Mateo Flow, MD as Consulting Physician (Ophthalmology)  Indicate any recent Medical Services you may have received from other than [facility] providers in the past year (date may be approximate).     Assessment:   This is a routine wellness examination for Talullah.  Hearing/Vision screen Hearing Screening - Comments:: Denies hearing difficulties   Vision Screening - Comments:: Wears rx glasses - up to date with routine eye exams with  Dr Benjamine Mola   Goals Addressed               This Visit's Progress     Increase physical activity (pt-stated)        Remain active.       Depression Screen     03/10/2024    9:31 AM 02/15/2024    9:52 AM 09/22/2023    3:00 PM 08/17/2023    9:02 AM 03/06/2023   10:50 AM 02/09/2023   10:03 AM 08/04/2022    9:25 AM  PHQ 2/9 Scores  PHQ - 2 Score 0 0 2 2 0 0 0  PHQ- 9 Score 0 6 8 8  0 2 0    Fall Risk     03/10/2024    9:33 AM 02/15/2024    9:23 AM 09/22/2023    3:00 PM 08/17/2023    9:02 AM 03/06/2023    9:56 AM  Fall Risk   Falls in the past year? 0 0 0 0 1  Number falls in past yr: 0 0 0 0 0  Injury with Fall? 0 0 0 0 0  Risk for fall due to : No Fall Risks    No Fall Risks  Follow up Falls prevention discussed;Falls evaluation completed Falls evaluation completed;Education provided Falls evaluation completed Falls evaluation completed Falls prevention discussed    MEDICARE RISK AT HOME:  Medicare Risk at Home Any stairs in or around the home?: No If so, are there any without handrails?: Yes Home free of loose throw rugs in walkways, pet beds, electrical cords, etc?: Yes Adequate lighting in your home to reduce  risk of falls?: Yes Life alert?: No Use of a cane, walker or w/c?: No Grab bars in the bathroom?: Yes Shower chair or bench in shower?: Yes Elevated toilet seat or a handicapped toilet?: Yes  TIMED UP AND GO:  Was the test performed?  No  Cognitive Function: 6CIT completed        03/10/2024    9:33 AM 03/06/2023   10:02 AM  6CIT Screen  What Year? 0 points 0 points  What month? 0 points 0 points  What time? 0 points 0 points  Count back from 20 0 points 0 points  Months in reverse 0 points 0 points  Repeat phrase 0 points 2 points  Total Score 0 points 2 points    Immunizations Immunization History  Administered Date(s) Administered   Fluad Quad(high Dose 65+) 08/02/2019   Influenza Split 11/06/2011, 08/17/2012, 09/15/2022   Influenza Whole 10/01/2007, 08/29/2008, 09/21/2009, 08/12/2010   Influenza, High Dose Seasonal PF 08/05/2017, 09/23/2018   Influenza,inj,Quad  PF,6+ Mos 11/10/2013, 09/11/2014   Influenza-Unspecified 09/25/2015, 09/24/2020, 10/01/2021, 08/31/2023   PFIZER(Purple Top)SARS-COV-2 Vaccination 01/06/2020, 01/29/2020, 10/31/2020   PNEUMOCOCCAL CONJUGATE-20 01/27/2022   Pfizer(Comirnaty)Fall Seasonal Vaccine 12 years and older 08/31/2023   Pneumococcal Conjugate-13 09/11/2014   Pneumococcal Polysaccharide-23 03/14/2002, 10/01/2007, 07/02/2016   Respiratory Syncytial Virus Vaccine,Recomb Aduvanted(Arexvy) 11/13/2023   Td 03/14/2002   Tdap 08/17/2012, 09/15/2022   Zoster, Live 11/19/2012    Screening Tests Health Maintenance  Topic Date Due   Zoster Vaccines- Shingrix (1 of 2) 06/13/1999   COVID-19 Vaccine (5 - 2024-25 season) 02/29/2024   INFLUENZA VACCINE  06/24/2024   Medicare Annual Wellness (AWV)  03/10/2025   Colonoscopy  06/28/2027   DTaP/Tdap/Td (4 - Td or Tdap) 09/15/2032   Pneumonia Vaccine 34+ Years old  Completed   DEXA SCAN  Completed   Hepatitis C Screening  Completed   HPV VACCINES  Aged Out   Meningococcal B Vaccine  Aged Out     Health Maintenance  Health Maintenance Due  Topic Date Due   Zoster Vaccines- Shingrix (1 of 2) 06/13/1999   COVID-19 Vaccine (5 - 2024-25 season) 02/29/2024   Health Maintenance Items Addressed: Zoster/Shingles Vaccine deferred  Additional Screening:  Vision Screening: Recommended annual ophthalmology exams for early detection of glaucoma and other disorders of the eye.  Dental Screening: Recommended annual dental exams for proper oral hygiene  Community Resource Referral / Chronic Care Management: CRR required this visit?  No   CCM required this visit?  No     Plan:     I have personally reviewed and noted the following in the patient's chart:   Medical and social history Use of alcohol, tobacco or illicit drugs  Current medications and supplements including opioid prescriptions. Patient is not currently taking opioid prescriptions. Functional ability and status Nutritional status Physical activity Advanced directives List of other physicians Hospitalizations, surgeries, and ER visits in previous 12 months Vitals Screenings to include cognitive, depression, and falls Referrals and appointments  In addition, I have reviewed and discussed with patient certain preventive protocols, quality metrics, and best practice recommendations. A written personalized care plan for preventive services as well as general preventive health recommendations were provided to patient.     Dewayne Ford, LPN   01/07/864   After Visit Summary: (MyChart) Due to this being a telephonic visit, the after visit summary with patients personalized plan was offered to patient via MyChart   Notes: Nothing significant to report at this time.

## 2024-03-14 ENCOUNTER — Other Ambulatory Visit (HOSPITAL_COMMUNITY): Payer: Self-pay | Admitting: Cardiology

## 2024-03-31 NOTE — Addendum Note (Signed)
 Addended by: Edra Govern D on: 03/31/2024 12:07 PM   Modules accepted: Orders

## 2024-03-31 NOTE — Progress Notes (Signed)
 Remote ICD transmission.

## 2024-04-04 DIAGNOSIS — M7631 Iliotibial band syndrome, right leg: Secondary | ICD-10-CM | POA: Diagnosis not present

## 2024-04-04 DIAGNOSIS — M7051 Other bursitis of knee, right knee: Secondary | ICD-10-CM | POA: Diagnosis not present

## 2024-04-04 DIAGNOSIS — M1711 Unilateral primary osteoarthritis, right knee: Secondary | ICD-10-CM | POA: Diagnosis not present

## 2024-04-04 DIAGNOSIS — M7632 Iliotibial band syndrome, left leg: Secondary | ICD-10-CM | POA: Diagnosis not present

## 2024-04-04 DIAGNOSIS — M25562 Pain in left knee: Secondary | ICD-10-CM | POA: Diagnosis not present

## 2024-04-13 DIAGNOSIS — M25562 Pain in left knee: Secondary | ICD-10-CM | POA: Diagnosis not present

## 2024-04-13 DIAGNOSIS — M25561 Pain in right knee: Secondary | ICD-10-CM | POA: Diagnosis not present

## 2024-04-13 DIAGNOSIS — M7051 Other bursitis of knee, right knee: Secondary | ICD-10-CM | POA: Diagnosis not present

## 2024-04-13 DIAGNOSIS — M7052 Other bursitis of knee, left knee: Secondary | ICD-10-CM | POA: Diagnosis not present

## 2024-04-20 DIAGNOSIS — M25562 Pain in left knee: Secondary | ICD-10-CM | POA: Diagnosis not present

## 2024-04-20 DIAGNOSIS — M7052 Other bursitis of knee, left knee: Secondary | ICD-10-CM | POA: Diagnosis not present

## 2024-04-20 DIAGNOSIS — M7051 Other bursitis of knee, right knee: Secondary | ICD-10-CM | POA: Diagnosis not present

## 2024-04-20 DIAGNOSIS — M25561 Pain in right knee: Secondary | ICD-10-CM | POA: Diagnosis not present

## 2024-04-25 DIAGNOSIS — M25562 Pain in left knee: Secondary | ICD-10-CM | POA: Diagnosis not present

## 2024-04-25 DIAGNOSIS — M25561 Pain in right knee: Secondary | ICD-10-CM | POA: Diagnosis not present

## 2024-04-25 DIAGNOSIS — M7052 Other bursitis of knee, left knee: Secondary | ICD-10-CM | POA: Diagnosis not present

## 2024-04-25 DIAGNOSIS — M7051 Other bursitis of knee, right knee: Secondary | ICD-10-CM | POA: Diagnosis not present

## 2024-04-27 DIAGNOSIS — M25562 Pain in left knee: Secondary | ICD-10-CM | POA: Diagnosis not present

## 2024-04-27 DIAGNOSIS — M7051 Other bursitis of knee, right knee: Secondary | ICD-10-CM | POA: Diagnosis not present

## 2024-04-27 DIAGNOSIS — C50912 Malignant neoplasm of unspecified site of left female breast: Secondary | ICD-10-CM | POA: Diagnosis not present

## 2024-04-27 DIAGNOSIS — M7052 Other bursitis of knee, left knee: Secondary | ICD-10-CM | POA: Diagnosis not present

## 2024-04-27 DIAGNOSIS — M25561 Pain in right knee: Secondary | ICD-10-CM | POA: Diagnosis not present

## 2024-04-27 DIAGNOSIS — C50911 Malignant neoplasm of unspecified site of right female breast: Secondary | ICD-10-CM | POA: Diagnosis not present

## 2024-04-28 DIAGNOSIS — L570 Actinic keratosis: Secondary | ICD-10-CM | POA: Diagnosis not present

## 2024-05-02 DIAGNOSIS — M7051 Other bursitis of knee, right knee: Secondary | ICD-10-CM | POA: Diagnosis not present

## 2024-05-02 DIAGNOSIS — M7052 Other bursitis of knee, left knee: Secondary | ICD-10-CM | POA: Diagnosis not present

## 2024-05-02 DIAGNOSIS — C50912 Malignant neoplasm of unspecified site of left female breast: Secondary | ICD-10-CM | POA: Diagnosis not present

## 2024-05-02 DIAGNOSIS — M25561 Pain in right knee: Secondary | ICD-10-CM | POA: Diagnosis not present

## 2024-05-02 DIAGNOSIS — M25562 Pain in left knee: Secondary | ICD-10-CM | POA: Diagnosis not present

## 2024-05-03 DIAGNOSIS — C50912 Malignant neoplasm of unspecified site of left female breast: Secondary | ICD-10-CM | POA: Diagnosis not present

## 2024-05-04 DIAGNOSIS — M7051 Other bursitis of knee, right knee: Secondary | ICD-10-CM | POA: Diagnosis not present

## 2024-05-04 DIAGNOSIS — M7052 Other bursitis of knee, left knee: Secondary | ICD-10-CM | POA: Diagnosis not present

## 2024-05-04 DIAGNOSIS — M25561 Pain in right knee: Secondary | ICD-10-CM | POA: Diagnosis not present

## 2024-05-04 DIAGNOSIS — M25562 Pain in left knee: Secondary | ICD-10-CM | POA: Diagnosis not present

## 2024-05-05 ENCOUNTER — Ambulatory Visit: Admitting: Student

## 2024-05-05 ENCOUNTER — Ambulatory Visit (HOSPITAL_BASED_OUTPATIENT_CLINIC_OR_DEPARTMENT_OTHER)
Admission: RE | Admit: 2024-05-05 | Discharge: 2024-05-05 | Disposition: A | Discharge status: Home / Self Care | Source: Ambulatory Visit | Attending: Student | Admitting: Student

## 2024-05-05 ENCOUNTER — Ambulatory Visit: Payer: Self-pay

## 2024-05-05 VITALS — BP 122/60 | HR 61 | Temp 98.3°F | Resp 12

## 2024-05-05 DIAGNOSIS — Z95810 Presence of automatic (implantable) cardiac defibrillator: Secondary | ICD-10-CM | POA: Diagnosis not present

## 2024-05-05 DIAGNOSIS — R0781 Pleurodynia: Secondary | ICD-10-CM | POA: Diagnosis not present

## 2024-05-05 DIAGNOSIS — S4991XA Unspecified injury of right shoulder and upper arm, initial encounter: Secondary | ICD-10-CM

## 2024-05-05 DIAGNOSIS — M25511 Pain in right shoulder: Secondary | ICD-10-CM | POA: Diagnosis not present

## 2024-05-05 DIAGNOSIS — M1811 Unilateral primary osteoarthritis of first carpometacarpal joint, right hand: Secondary | ICD-10-CM | POA: Diagnosis not present

## 2024-05-05 DIAGNOSIS — S6991XA Unspecified injury of right wrist, hand and finger(s), initial encounter: Secondary | ICD-10-CM | POA: Insufficient documentation

## 2024-05-05 DIAGNOSIS — M19011 Primary osteoarthritis, right shoulder: Secondary | ICD-10-CM | POA: Diagnosis not present

## 2024-05-05 MED ORDER — TIZANIDINE HCL 2 MG PO TABS: 2.0000 mg | ORAL_TABLET | Freq: Three times a day (TID) | ORAL | 0 refills | Status: AC | PRN

## 2024-05-05 NOTE — Telephone Encounter (Signed)
       FYI Only or Action Required?: FYI only for provider  Patient was last seen in primary care on 02/15/2024 by Ezell Hollow, MD. Called Nurse Triage reporting Fall. Symptoms began yesterday. Interventions attempted: Nothing. Symptoms are: unchanged.  Triage Disposition: No disposition on file.  Patient/caregiver understands and will follow disposition?: Copied from CRM [id number]. Topic: Clinical - Red Word Triage >> May 05, 2024  8:55 AM Deaijah H wrote: Red Word that prompted transfer to Nurse Triage: Tripped on wood scratched leg, rib sore, hard to move shoulder (pain) Reason for Disposition  MILD weakness (i.e., does not interfere with ability to work, go to school, normal activities)  (Exception: Mild weakness is a chronic symptom.)  Answer Assessment - Initial Assessment Questions 1. MECHANISM: How did the fall happen?     Working in the yard and fell over a piece of wood 2. DOMESTIC VIOLENCE AND ELDER ABUSE SCREENING: Did you fall because someone pushed you or tried to hurt you? If Yes, ask: Are you safe now?     denies 3. ONSET: When did the fall happen? (e.g., minutes, hours, or days ago)     yesterday 4. LOCATION: What part of the body hit the ground? (e.g., back, buttocks, head, hips, knees, hands, head, stomach)     Right wrist, rib, shoulder 5. INJURY: Did you hurt (injure) yourself when you fell? If Yes, ask: What did you injure? Tell me more about this? (e.g., body area; type of injury; pain severity)     See above 6. PAIN: Is there any pain? If Yes, ask: How bad is the pain? (e.g., Scale 1-10; or mild,  moderate, severe)   - NONE (0): No pain   - MILD (1-3): Doesn't interfere with normal activities    - MODERATE (4-7): Interferes with normal activities or awakens from sleep    - SEVERE (8-10): Excruciating pain, unable to do any normal activities      Shoulder is severe 7. SIZE: For cuts, bruises, or swelling, ask: How large is it? (e.g., inches  or centimeters)      Scratch to right leg 8. PREGNANCY: Is there any chance you are pregnant? When was your last menstrual period?     na 9. OTHER SYMPTOMS: Do you have any other symptoms? (e.g., dizziness, fever, weakness; new onset or worsening).      no 10. CAUSE: What do you think caused the fall (or falling)? (e.g., tripped, dizzy spell)       Tripped.  Protocols used: Falls and [facility]

## 2024-05-05 NOTE — Assessment & Plan Note (Addendum)
 Analgesic prescribed.  Referral to Ortho for further evaluation.  Advised rest, ice, use of over-the-counter anti-inflammatories and use of right shoulder sling.  X-rays pending.

## 2024-05-05 NOTE — Progress Notes (Signed)
 Subjective:     Patient ID: [name], female    DOB: March 15, 1951, 73 y.o.   MRN: [id number]  Chief Complaint  Patient presents with   Fall    Hit right side.  Right shoulder pain, rib pain right side, right wrist pain, right ankle.    HPI  [name] is a 73 year old female presents for acute visit.  Yesterday working in the yard she tripped over a piece of wood and fell.  She states that she hit her right wrist and shoulder when she fell.  She says her shoulder pain is severe 8 out of 10 with limited range of motion, also reports pain in ribs right side on side a fall are painful.  Pain with wrist and shoulder movement.  Denies musculoskeletal pain in her right ankle, reports  abrasion from object that scratched during fall. Denies numbness, tingling, ecchymosis.  Patient reports tripping over a piece of wood while working in the yard yesterday and falling onto her right side. She struck right wrist and landed on shoulder during the fall. She describes severe right shoulder pain rated 8/10 with significantly limited range of motion, and reports pain over the right ribs following fall. She also notes pain with movement of both the right wrist and shoulder. Superficial abrasion sustained during the fall on R ankle, Denies right ankle pain.   Pt denies numbness, tingling, ecchymosis.fever, chills, SOB, CP, palpitations, dyspnea, edema, HA, vision changes, N/V/D, abdominal pain, urinary symptoms, rash, weight changes, and recent illness or hospitalizations.       History of Present Illness              Health Maintenance Due  Topic Date Due   Zoster Vaccines- Shingrix  (1 of 2) 06/13/1999   COVID-19 Vaccine (5 - 2024-25 season) 02/29/2024    Past Medical History:  Diagnosis Date   Allergy    Arthritis    Breast CA (HCC)    twice first on L breast 1982 w/ chest wall involvment, then a second breast ca on the R in 1987   Chronic systolic CHF (congestive heart  failure) (HCC)    a. due to Adriamycin,s/p ICD;  b. 01/2013 Gen change and new LV lead - BSX Energen CRT-D BiV ICD, Ser # 409811   Depression    Diverticulitis    Early menopause    early 30s   Fatigue 02/20/2016   Glaucoma suspect of both eyes    Dr. Thor Fling   Heart murmur    Hyperlipidemia    Nonischemic cardiomyopathy (HCC)    OSA (obstructive sleep apnea) 02/07/2016   Osteopenia    Renal calculus    Snoring 02/20/2016   TIA (transient ischemic attack)     Past Surgical History:  Procedure Laterality Date   BI-VENTRICULAR IMPLANTABLE CARDIOVERTER DEFIBRILLATOR UPGRADE N/A 02/04/2013   Procedure: BI-VENTRICULAR IMPLANTABLE CARDIOVERTER DEFIBRILLATOR UPGRADE;  Surgeon: Tammie Fall, MD;  Location: [facility] CATH LAB;  Service: Cardiovascular;  Laterality: N/A;   BIV ICD GENERATOR CHANGEOUT N/A 02/06/2023   Procedure: BIV ICD GENERATOR CHANGEOUT;  Surgeon: Verona Goodwill, MD;  Location: [facility] INVASIVE CV LAB;  Service: Cardiovascular;  Laterality: N/A;   CARDIAC DEFIBRILLATOR PLACEMENT     AICD Replaced-5/09 and 2014   LEFT AND RIGHT HEART CATHETERIZATION WITH CORONARY ANGIOGRAM N/A 10/06/2013   Procedure: LEFT AND RIGHT HEART CATHETERIZATION WITH CORONARY ANGIOGRAM;  Surgeon: Mardell Shade, MD;  Location: [facility] CATH LAB;  Service: Cardiovascular;  Laterality: N/A;  LITHOTRIPSY     h/o several procedures    MASTECTOMY Bilateral    OOPHORECTOMY Bilateral    triger finger, R ring finger Right 02/2019   VENOGRAM N/A 10/20/2012   Procedure: VENOGRAM;  Surgeon: Verona Goodwill, MD;  Location: [facility] CATH LAB;  Service: Cardiovascular;  Laterality: N/A;    Family History  Problem Relation Age of Onset   Breast cancer Mother        M and sister    Lung cancer Father    Breast cancer Sister    Kidney cancer Brother    Breast cancer Maternal Grandmother    Heart attack Neg Hx    Diabetes Neg Hx    Colon cancer Neg Hx    Esophageal cancer Neg Hx    Stomach cancer Neg Hx      Social History   Socioeconomic History   Marital status: Married    Spouse name: Felix Host   Number of children: 3   Years of education: Not on file   Highest education level: Not on file  Occupational History   Occupation: RETIRED---pre Surveyor, 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  Tobacco Use   Smoking status: Never   Smokeless tobacco: Never  Vaping Use   Vaping status: Never Used  Substance and Sexual Activity   Alcohol use: Yes    Comment: socially/occassionally   Drug use: No   Sexual activity: Not on file  Other Topics Concern   Not on file  Social History Narrative   Related to Mr and Mrs Marylen Snowman, they are my patients as well    Married, 3 children all in [place], 8 Gkids     Siblings, 2 deceased, 2 living brothers    Social Drivers of Corporate investment banker Strain: Low Risk  (03/10/2024)   Overall Financial Resource Strain (CARDIA)    Difficulty of Paying Living Expenses: Not hard at all  Food Insecurity: No Food Insecurity (03/10/2024)   Hunger Vital Sign    Worried About Running Out of Food in the Last Year: Never true    Ran Out of Food in the Last Year: Never true  Transportation Needs: No Transportation Needs (03/10/2024)   PRAPARE - Administrator, Civil Service (Medical): No    Lack of Transportation (Non-Medical): No  Physical Activity: Insufficiently Active (03/10/2024)   Exercise Vital Sign    Days of Exercise per Week: 3 days    Minutes of Exercise per Session: 30 min  Stress: No Stress Concern Present (03/10/2024)   [employer] of Occupational Health - Occupational Stress Questionnaire    Feeling of Stress : Not at all  Social Connections: Socially Integrated (03/10/2024)   Social Connection and Isolation Panel    Frequency of Communication with Friends and Family: More than three times a week    Frequency of Social Gatherings with Friends and Family: More than three times a week    Attends Religious  Services: More than 4 times per year    Active Member of [employer] or Organizations: Yes    Attends Banker Meetings: More than 4 times per year    Marital Status: Married  Catering manager Violence: Not At Risk (03/10/2024)   Humiliation, Afraid, Rape, and Kick questionnaire    Fear of Current or Ex-Partner: No    Emotionally Abused: No    Physically Abused: No    Sexually Abused: No    Outpatient Medications  Prior to Visit  Medication Sig Dispense Refill   albuterol  (VENTOLIN  HFA) 108 (90 Base) MCG/ACT inhaler Inhale 2 puffs into the lungs every 6 (six) hours as needed. 18 g 0   atorvastatin  (LIPITOR) 40 MG tablet Take 1 tablet (40 mg total) by mouth daily. 90 tablet 1   carvedilol  (COREG ) 12.5 MG tablet Take 1 tablet by mouth in the morning and take 1 tablet at night 180 tablet 3   cholecalciferol 25 MCG (1000 UT) tablet Take 2,000 Units by mouth daily.     clopidogrel  (PLAVIX ) 75 MG tablet TAKE ONE TABLET BY MOUTH ONCE A DAY 90 tablet 3   fexofenadine  (ALLEGRA  ALLERGY) 60 MG tablet Take 1 tablet (60 mg total) by mouth 2 (two) times daily.     fluticasone  (FLONASE ) 50 MCG/ACT nasal spray Place 2 sprays into both nostrils daily. 16 g 1   furosemide  (LASIX ) 40 MG tablet Take 2 tablets (80 mg total) by mouth in the morning AND 1.5 tablets (60 mg total) every evening. 270 tablet 3   gabapentin  (NEURONTIN ) 300 MG capsule Take 1 capsule (300 mg total) by mouth daily as needed. 90 capsule 1   Multiple Vitamin (MULTIVITAMIN) capsule Take 1 capsule by mouth daily. Focus factor     Multiple Vitamins-Minerals (ICAPS AREDS 2 PO) Take 1 tablet by mouth daily.     Omega 3 1000 MG CAPS Take 1,000 mg by mouth daily.     pantoprazole  (PROTONIX ) 40 MG tablet Take 1 tablet (40 mg total) by mouth daily. 90 tablet 1   Polyvinyl Alcohol-Povidone PF (REFRESH) 1.4-0.6 % SOLN Place 1 drop into both eyes daily as needed (Dry eyes).     potassium chloride  (KLOR-CON  M) 10 MEQ tablet Take 4 tablets (40 mEq  total) by mouth daily. 360 tablet 3   sacubitril -valsartan  (ENTRESTO ) 24-26 MG Take 1 tablet by mouth 2 (two) times daily. 180 tablet 3   sertraline  (ZOLOFT ) 25 MG tablet Take 1 tablet (25 mg total) by mouth daily. 90 tablet 1   spironolactone  (ALDACTONE ) 25 MG tablet Take 1 tablet (25 mg total) by mouth daily. 90 tablet 3   triamcinolone  cream (KENALOG ) 0.1 % Apply 1 Application topically 2 (two) times daily. 30 g 0   TURMERIC PO Take 1,000 mg by mouth 2 (two) times daily.     zinc gluconate 50 MG tablet Take 50 mg by mouth daily.     zolpidem  (AMBIEN ) 10 MG tablet TAKE ONE-HALF TO ONE TABLET BY MOUTH DAILY AT BEDTIME AS NEEDED FOR SLEEP 90 tablet 1   tiZANidine  (ZANAFLEX ) 2 MG tablet Take 1 tablet (2 mg total) by mouth every 8 (eight) hours as needed for muscle spasms. 30 tablet 0   HYDROcodone  bit-homatropine (HYCODAN) 5-1.5 MG/5ML syrup Take 5 mLs by mouth every 8 (eight) hours as needed for cough. (Patient not taking: Reported on 05/05/2024) 120 mL 0   No facility-administered medications prior to visit.    Allergies  Allergen Reactions   Atacand [Candesartan] Other (See Comments)    Causes fatigue    Dapagliflozin     Details not clear   Keflex [Cephalexin] Hives and Itching   Latex Rash    ROS See HPI    Objective:    Physical Exam  General: No acute distress. Awake and conversant.  Eyes: Normal conjunctiva, anicteric. Round symmetric pupils.   Respiratory: CTAB. Respirations are non-labored. No wheezing.  Skin: warm dry intact; small abrasion RLE (stained from fall).  No drainage or signs  of infection Psych: Alert and oriented. Cooperative, Appropriate mood and affect, Normal judgment.  CV: RRR. No murmur. No lower extremity edema. +2 rad pulse. Cap refill >2 seconds. MSK: Normal ambulation. No clubbing or cyanosis.  Left Shoulder- WNL, full ROM. Right shoulder-NO ecchymosis, bruising, deformity.  Limited ROM right shoulder-  abduction, adduction flexion and extension.   Pain with  ROM >45 degrees R sohulder Right wrist-tender to palpation +swelling. No ecchymosis.  Full ROM-slight pain with range of motion. Left Wrist- WNL, full ROM. Sensation intact. Ribs: Tender with palpation right side.  Left side within normal limits. Ankles- Full ROM, no swelling ecchymosis or deformity Neuro:  CN II-XII grossly normal.       BP 122/60 (BP Location: Right Arm, Patient Position: Sitting, Cuff Size: Normal)   Pulse 61   Temp 98.3 F (36.8 C) (Oral)   Resp 12   SpO2 94%  Wt Readings from Last 3 Encounters:  03/10/24 177 lb (80.3 kg)  02/15/24 177 lb 6 oz (80.5 kg)  01/12/24 176 lb (79.8 kg)       Assessment & Plan:   Problem List Items Addressed This Visit     Injury of right shoulder   Analgesic prescribed.  Referral to Ortho for further evaluation.  Advised rest, ice, use of over-the-counter anti-inflammatories and use of right shoulder sling.  X-rays pending.      Relevant Medications   tiZANidine  (ZANAFLEX ) 2 MG tablet   Other Relevant Orders   DG Shoulder Right   Ambulatory referral to Orthopedic Surgery   Injury of right wrist - Primary   Relevant Orders   DG Wrist Complete Right   Rib pain   Relevant Orders   DG Chest 2 View   Rest the injured area as much as practical, use a sling, X-Rays ordered, referral to Orthopedics for shoulder injury, prescription for analgesic given.  See orders for this visit as documented in the electronic medical record.   Patient was educated on the diagnosis, treatment options, potential risks, benefits, and alternatives. All questions were addressed. Patient verbalized understanding and agrees with the plan of care. Will follow up as advised or sooner if symptoms worsen or new concerns arise.   Portions of this note were dictated using DRAGON voice recognition software. Please disregard any errors in transcription.      I am having Othell CJulaine Nutting maintain her gabapentin , fexofenadine ,  cholecalciferol, Multiple Vitamins-Minerals (ICAPS AREDS 2 PO), zinc gluconate, Omega 3, multivitamin, Refresh, TURMERIC PO, Entresto , carvedilol , spironolactone , atorvastatin , furosemide , triamcinolone  cream, potassium chloride , pantoprazole , sertraline , HYDROcodone  bit-homatropine, fluticasone , albuterol , zolpidem , clopidogrel , and tiZANidine .  Meds ordered this encounter  Medications   tiZANidine  (ZANAFLEX ) 2 MG tablet    Sig: Take 1 tablet (2 mg total) by mouth every 8 (eight) hours as needed for muscle spasms.    Dispense:  15 tablet    Refill:  0    Supervising Provider:   Randie Bustle A [[id number]]

## 2024-05-09 ENCOUNTER — Ambulatory Visit: Payer: Self-pay | Admitting: Student

## 2024-05-09 ENCOUNTER — Other Ambulatory Visit: Payer: Self-pay | Admitting: Internal Medicine

## 2024-05-09 ENCOUNTER — Ambulatory Visit: Payer: Medicare Other

## 2024-05-09 DIAGNOSIS — I428 Other cardiomyopathies: Secondary | ICD-10-CM

## 2024-05-09 DIAGNOSIS — I5022 Chronic systolic (congestive) heart failure: Secondary | ICD-10-CM

## 2024-05-09 LAB — CUP PACEART REMOTE DEVICE CHECK
Battery Remaining Longevity: 120 mo
Battery Remaining Percentage: 100 %
Brady Statistic RA Percent Paced: 0 %
Brady Statistic RV Percent Paced: 100 %
Date Time Interrogation Session: 20250616045200
HighPow Impedance: 49 Ohm
Implantable Lead Connection Status: 753985
Implantable Lead Connection Status: 753985
Implantable Lead Connection Status: 753985
Implantable Lead Implant Date: 20050701
Implantable Lead Implant Date: 20050701
Implantable Lead Implant Date: 20140314
Implantable Lead Location: 753858
Implantable Lead Location: 753859
Implantable Lead Location: 753860
Implantable Lead Model: 158
Implantable Lead Model: 4196
Implantable Lead Model: 5076
Implantable Lead Serial Number: 156416
Implantable Pulse Generator Implant Date: 20240315
Lead Channel Impedance Value: 441 Ohm
Lead Channel Impedance Value: 459 Ohm
Lead Channel Impedance Value: 561 Ohm
Lead Channel Pacing Threshold Amplitude: 0.5 V
Lead Channel Pacing Threshold Amplitude: 1.1 V
Lead Channel Pacing Threshold Pulse Width: 0.4 ms
Lead Channel Pacing Threshold Pulse Width: 0.4 ms
Lead Channel Setting Pacing Amplitude: 2 V
Lead Channel Setting Pacing Amplitude: 2 V
Lead Channel Setting Pacing Amplitude: 2 V
Lead Channel Setting Pacing Pulse Width: 0.4 ms
Lead Channel Setting Pacing Pulse Width: 0.6 ms
Lead Channel Setting Sensing Sensitivity: 0.6 mV
Lead Channel Setting Sensing Sensitivity: 1 mV
Pulse Gen Serial Number: 159739

## 2024-05-10 ENCOUNTER — Ambulatory Visit: Payer: Self-pay | Admitting: Cardiology

## 2024-05-23 DIAGNOSIS — C50912 Malignant neoplasm of unspecified site of left female breast: Secondary | ICD-10-CM | POA: Diagnosis not present

## 2024-05-30 DIAGNOSIS — M25562 Pain in left knee: Secondary | ICD-10-CM | POA: Diagnosis not present

## 2024-05-30 DIAGNOSIS — M7051 Other bursitis of knee, right knee: Secondary | ICD-10-CM | POA: Diagnosis not present

## 2024-05-30 DIAGNOSIS — M25561 Pain in right knee: Secondary | ICD-10-CM | POA: Diagnosis not present

## 2024-05-30 DIAGNOSIS — M7052 Other bursitis of knee, left knee: Secondary | ICD-10-CM | POA: Diagnosis not present

## 2024-06-01 DIAGNOSIS — M25562 Pain in left knee: Secondary | ICD-10-CM | POA: Diagnosis not present

## 2024-06-01 DIAGNOSIS — M7052 Other bursitis of knee, left knee: Secondary | ICD-10-CM | POA: Diagnosis not present

## 2024-06-01 DIAGNOSIS — M7051 Other bursitis of knee, right knee: Secondary | ICD-10-CM | POA: Diagnosis not present

## 2024-06-01 DIAGNOSIS — M25561 Pain in right knee: Secondary | ICD-10-CM | POA: Diagnosis not present

## 2024-06-07 ENCOUNTER — Other Ambulatory Visit: Payer: Self-pay | Admitting: Internal Medicine

## 2024-06-09 NOTE — Telephone Encounter (Signed)
 This is a CHF pt

## 2024-06-10 ENCOUNTER — Other Ambulatory Visit (HOSPITAL_COMMUNITY): Payer: Self-pay | Admitting: Internal Medicine

## 2024-06-17 NOTE — Progress Notes (Signed)
 Remote ICD transmission.

## 2024-06-22 ENCOUNTER — Other Ambulatory Visit (HOSPITAL_COMMUNITY): Payer: Self-pay | Admitting: Family Medicine

## 2024-07-11 ENCOUNTER — Other Ambulatory Visit: Payer: Self-pay | Admitting: Internal Medicine

## 2024-07-20 DIAGNOSIS — M1811 Unilateral primary osteoarthritis of first carpometacarpal joint, right hand: Secondary | ICD-10-CM | POA: Diagnosis not present

## 2024-07-20 DIAGNOSIS — M7541 Impingement syndrome of right shoulder: Secondary | ICD-10-CM | POA: Diagnosis not present

## 2024-07-20 DIAGNOSIS — M65322 Trigger finger, left index finger: Secondary | ICD-10-CM | POA: Diagnosis not present

## 2024-07-27 ENCOUNTER — Other Ambulatory Visit (HOSPITAL_COMMUNITY): Payer: Self-pay | Admitting: Family Medicine

## 2024-07-27 ENCOUNTER — Other Ambulatory Visit: Payer: Self-pay | Admitting: Internal Medicine

## 2024-07-27 DIAGNOSIS — I5022 Chronic systolic (congestive) heart failure: Secondary | ICD-10-CM

## 2024-08-06 NOTE — Progress Notes (Signed)
  Electrophysiology Office Note:   Date:  08/08/2024  ID:  [name], DOB 1951-09-16, MRN [id number]  Primary Cardiologist: None Primary Heart Failure: None Electrophysiologist: OLE ONEIDA HOLTS, MD       History of Present Illness:   [name] is a 73 y.o. female with h/o chemotherapy associated cardiomyopathy / HFrEF / NICM  s/p ICD, LBBB, VT, AV disease, TIA, depression, breast cancer seen today for routine electrophysiology followup.   Since last being seen in our clinic the patient reports doing well overall. She has no device related concerns. She states she is going to really miss Dr. Fernande.  She   She denies chest pain, palpitations, dyspnea, PND, orthopnea, nausea, vomiting, dizziness, syncope, edema, weight gain, or early satiety.   Review of systems complete and found to be negative unless listed in HPI.   EP Information / Studies Reviewed:    EKG is not ordered today. EKG from 01/06/24 reviewed which showed VP 58 bpm       ICD Interrogation-  reviewed in detail today,  See PACEART report.  Device History: [employer] BiV ICD implanted 05/24/04 with LV lead 02/04/13 for HFrEF Mixed Lead System  History of appropriate therapy: No History of AAD therapy: No   Risk Assessment/Calculations:              Physical Exam:   VS:  BP (!) 108/58   Pulse 67   Ht 5' 4 (1.626 m)   Wt 180 lb (81.6 kg)   SpO2 96%   BMI 30.90 kg/m    Wt Readings from Last 3 Encounters:  08/08/24 180 lb (81.6 kg)  03/10/24 177 lb (80.3 kg)  02/15/24 177 lb 6 oz (80.5 kg)     GEN: Well nourished, well developed in no acute distress NECK: No JVD; No carotid bruits CARDIAC: Regular rate and rhythm, no murmurs, rubs, gallops RESPIRATORY:  Clear to auscultation without rales, wheezing or rhonchi  ABDOMEN: Soft, non-tender, non-distended EXTREMITIES:  No edema; No deformity   ASSESSMENT AND PLAN:    NICM / Chronic Systolic Dysfunction s/p [employer] CRT-D  VT  / NSVT -euvolemic on exam / by device   -100% BiV pacing  -Stable on an appropriate medical regimen -Normal ICD function -See Pace Art report -No changes today -continue coreg  12.5 mg BID    Hx TIA  Hx Breast CA  Disposition:   Follow up with Dr. HOLTS in 12 months > new EP to meet next year.     Signed, Daphne Barrack, NP-C, AGACNP-BC [facility] - Electrophysiology  08/08/2024, 1:10 PM

## 2024-08-08 ENCOUNTER — Encounter: Payer: Self-pay | Admitting: Pulmonary Disease

## 2024-08-08 ENCOUNTER — Ambulatory Visit (INDEPENDENT_AMBULATORY_CARE_PROVIDER_SITE_OTHER): Payer: Medicare Other

## 2024-08-08 ENCOUNTER — Ambulatory Visit: Attending: Pulmonary Disease | Admitting: Pulmonary Disease

## 2024-08-08 DIAGNOSIS — Z95810 Presence of automatic (implantable) cardiac defibrillator: Secondary | ICD-10-CM | POA: Diagnosis not present

## 2024-08-08 DIAGNOSIS — I428 Other cardiomyopathies: Secondary | ICD-10-CM

## 2024-08-08 DIAGNOSIS — I5022 Chronic systolic (congestive) heart failure: Secondary | ICD-10-CM | POA: Diagnosis not present

## 2024-08-08 LAB — CUP PACEART INCLINIC DEVICE CHECK
Date Time Interrogation Session: 20250915130720
Implantable Lead Connection Status: 753985
Implantable Lead Connection Status: 753985
Implantable Lead Connection Status: 753985
Implantable Lead Implant Date: 20050701
Implantable Lead Implant Date: 20050701
Implantable Lead Implant Date: 20140314
Implantable Lead Location: 753858
Implantable Lead Location: 753859
Implantable Lead Location: 753860
Implantable Lead Model: 158
Implantable Lead Model: 4196
Implantable Lead Model: 5076
Implantable Lead Serial Number: 156416
Implantable Pulse Generator Implant Date: 20240315
Pulse Gen Serial Number: 159739

## 2024-08-08 NOTE — Patient Instructions (Signed)
 Medication Instructions:   Your physician recommends that you continue on your current medications as directed. Please refer to the Current Medication list given to you today.  *If you need a refill on your cardiac medications before your next appointment, please call your pharmacy*   Lab Work: NONE ORDERED  TODAY     If you have labs (blood work) drawn today and your tests are completely normal, you will receive your results only by: MyChart Message (if you have MyChart) OR A paper copy in the mail If you have any lab test that is abnormal or we need to change your treatment, we will call you to review the results.   Testing/Procedures: NONE ORDERED  TODAY      Follow-Up:  At [facility], you and your health needs are our priority.  As part of our continuing mission to provide you with exceptional heart care, our providers are all part of one team.  This team includes your primary Cardiologist (physician) and Advanced Practice Providers or APPs (Physician Assistants and Nurse Practitioners) who all work together to provide you with the care you need, when you need it.   Your next appointment:     1 year(s)   Provider:    Ole Holts, MD     We recommend signing up for the patient portal called MyChart.  Sign up information is provided on this After Visit Summary.  MyChart is used to connect with patients for Virtual Visits (Telemedicine).  Patients are able to view lab/test results, encounter notes, upcoming appointments, etc.  Non-urgent messages can be sent to your provider as well.   To learn more about what you can do with MyChart, go to [email or web address].   Other Instructions

## 2024-08-10 LAB — CUP PACEART REMOTE DEVICE CHECK
Battery Remaining Longevity: 114 mo
Battery Remaining Percentage: 100 %
Brady Statistic RA Percent Paced: 0 %
Brady Statistic RV Percent Paced: 100 %
Date Time Interrogation Session: 20250915022400
HighPow Impedance: 56 Ohm
Implantable Lead Connection Status: 753985
Implantable Lead Connection Status: 753985
Implantable Lead Connection Status: 753985
Implantable Lead Implant Date: 20050701
Implantable Lead Implant Date: 20050701
Implantable Lead Implant Date: 20140314
Implantable Lead Location: 753858
Implantable Lead Location: 753859
Implantable Lead Location: 753860
Implantable Lead Model: 158
Implantable Lead Model: 4196
Implantable Lead Model: 5076
Implantable Lead Serial Number: 156416
Implantable Pulse Generator Implant Date: 20240315
Lead Channel Impedance Value: 476 Ohm
Lead Channel Impedance Value: 533 Ohm
Lead Channel Impedance Value: 609 Ohm
Lead Channel Pacing Threshold Amplitude: 0.6 V
Lead Channel Pacing Threshold Amplitude: 0.9 V
Lead Channel Pacing Threshold Pulse Width: 0.4 ms
Lead Channel Pacing Threshold Pulse Width: 0.4 ms
Lead Channel Setting Pacing Amplitude: 2 V
Lead Channel Setting Pacing Amplitude: 2 V
Lead Channel Setting Pacing Amplitude: 2 V
Lead Channel Setting Pacing Pulse Width: 0.4 ms
Lead Channel Setting Pacing Pulse Width: 0.6 ms
Lead Channel Setting Sensing Sensitivity: 0.6 mV
Lead Channel Setting Sensing Sensitivity: 1 mV
Pulse Gen Serial Number: 159739

## 2024-08-11 ENCOUNTER — Ambulatory Visit: Payer: Self-pay | Admitting: Cardiology

## 2024-08-15 ENCOUNTER — Ambulatory Visit: Admitting: Internal Medicine

## 2024-08-15 VITALS — BP 116/64 | HR 72 | Temp 98.4°F | Resp 16 | Ht 64.0 in | Wt 178.4 lb

## 2024-08-15 DIAGNOSIS — I472 Ventricular tachycardia, unspecified: Secondary | ICD-10-CM | POA: Diagnosis not present

## 2024-08-15 DIAGNOSIS — R739 Hyperglycemia, unspecified: Secondary | ICD-10-CM

## 2024-08-15 DIAGNOSIS — E785 Hyperlipidemia, unspecified: Secondary | ICD-10-CM | POA: Diagnosis not present

## 2024-08-15 DIAGNOSIS — I428 Other cardiomyopathies: Secondary | ICD-10-CM | POA: Diagnosis not present

## 2024-08-15 LAB — LIPID PANEL
Cholesterol: 113 mg/dL (ref 0–200)
HDL: 51.4 mg/dL (ref >39.00)
LDL Cholesterol: 44 mg/dL (ref 0–99)
NonHDL: 61.37
Total CHOL/HDL Ratio: 2
Triglycerides: 89 mg/dL (ref 0.0–149.0)
VLDL: 17.8 mg/dL (ref 0.0–40.0)

## 2024-08-15 LAB — BASIC METABOLIC PANEL WITH GFR
BUN: 14 mg/dL (ref 6–23)
CO2: 31 meq/L (ref 19–32)
Calcium: 10 mg/dL (ref 8.4–10.5)
Chloride: 101 meq/L (ref 96–112)
Creatinine, Ser: 0.8 mg/dL (ref 0.40–1.20)
GFR: 72.2 mL/min (ref >60.00)
Glucose, Bld: 119 mg/dL — ABNORMAL HIGH (ref 70–99)
Potassium: 4.2 meq/L (ref 3.5–5.1)
Sodium: 139 meq/L (ref 135–145)

## 2024-08-15 LAB — CBC WITH DIFFERENTIAL/PLATELET
Basophils Absolute: 0 K/uL (ref 0.0–0.1)
Basophils Relative: 0.5 % (ref 0.0–3.0)
Eosinophils Absolute: 0.2 K/uL (ref 0.0–0.7)
Eosinophils Relative: 3 % (ref 0.0–5.0)
HCT: 38.6 % (ref 36.0–46.0)
Hemoglobin: 12.8 g/dL (ref 12.0–15.0)
Lymphocytes Relative: 27.9 % (ref 12.0–46.0)
Lymphs Abs: 1.9 K/uL (ref 0.7–4.0)
MCHC: 33.1 g/dL (ref 30.0–36.0)
MCV: 91.5 fl (ref 78.0–100.0)
Monocytes Absolute: 0.8 K/uL (ref 0.1–1.0)
Monocytes Relative: 11.4 % (ref 3.0–12.0)
Neutro Abs: 3.9 K/uL (ref 1.4–7.7)
Neutrophils Relative %: 57.2 % (ref 43.0–77.0)
Platelets: 307 K/uL (ref 150.0–400.0)
RBC: 4.22 Mil/uL (ref 3.87–5.11)
RDW: 13.7 % (ref 11.5–15.5)
WBC: 6.8 K/uL (ref 4.0–10.5)

## 2024-08-15 LAB — HEMOGLOBIN A1C: Hgb A1c MFr Bld: 6.6 % — ABNORMAL HIGH (ref 4.6–6.5)

## 2024-08-15 NOTE — Assessment & Plan Note (Signed)
 Hyperglycemia: Check A1c High cholesterol: On atorvastatin , last LFTs okay, check FLP. NICM, chronic systolic dysfunction, paroxysmal VT: LOV cardiology 08/08/2024.  No changes made.  Next visit 1 year Cough: Sporadic, nagging, takes occasionally Hycodan.  Recommend to monitor the issue, come back if cough gets worse. Other issues seem stable. RTC 6 months CPX.  Sooner if needed

## 2024-08-15 NOTE — Patient Instructions (Signed)
 Proceed with a flu shot as you are planning I also recommend a COVID-vaccine  Check the  blood pressure regularly Blood pressure goal:  between 110/65 and  135/85. If it is consistently higher or lower, let me know     GO TO THE LAB :  Get the blood work   Your results will be posted on MyChart with my comments  Go to the front desk for the checkout Please make an appointment for a physical exam in 6 months.  Come back sooner if needed.

## 2024-08-15 NOTE — Progress Notes (Signed)
Remote ICD Transmission.

## 2024-08-15 NOTE — Progress Notes (Signed)
 Subjective:    Patient ID: [name], female    DOB: 04-07-1951, 74 y.o.   MRN: [id number]  DOS:  08/15/2024 Type of visit - description: follow up  Routine follow-up.  Feeling well.  Has no major concerns or symptoms. Saw cardiology, note reviewed. Still has episodic cough without other respiratory symptoms.   Review of Systems See above   Past Medical History:  Diagnosis Date   Allergy    Arthritis    Breast CA (HCC)    twice first on L breast 1982 w/ chest wall involvment, then a second breast ca on the R in 1987   Chronic systolic CHF (congestive heart failure) (HCC)    a. due to Adriamycin,s/p ICD;  b. 01/2013 Gen change and new LV lead - BSX Energen CRT-D BiV ICD, Ser # [id number]   Depression    Diverticulitis    Early menopause    early 30s   Fatigue 02/20/2016   Glaucoma suspect of both eyes    Dr. Lonni Ferrier   Heart murmur    Hyperlipidemia    Nonischemic cardiomyopathy (HCC)    OSA (obstructive sleep apnea) 02/07/2016   Osteopenia    Renal calculus    Snoring 02/20/2016   TIA (transient ischemic attack)     Past Surgical History:  Procedure Laterality Date   BI-VENTRICULAR IMPLANTABLE CARDIOVERTER DEFIBRILLATOR UPGRADE N/A 02/04/2013   Procedure: BI-VENTRICULAR IMPLANTABLE CARDIOVERTER DEFIBRILLATOR UPGRADE;  Surgeon: Danelle LELON Birmingham, MD;  Location: [facility] CATH LAB;  Service: Cardiovascular;  Laterality: N/A;   BIV ICD GENERATOR CHANGEOUT N/A 02/06/2023   Procedure: BIV ICD GENERATOR CHANGEOUT;  Surgeon: Fernande Elspeth JAYSON, MD;  Location: [facility] INVASIVE CV LAB;  Service: Cardiovascular;  Laterality: N/A;   CARDIAC DEFIBRILLATOR PLACEMENT     AICD Replaced-5/09 and 2014   LEFT AND RIGHT HEART CATHETERIZATION WITH CORONARY ANGIOGRAM N/A 10/06/2013   Procedure: LEFT AND RIGHT HEART CATHETERIZATION WITH CORONARY ANGIOGRAM;  Surgeon: Toribio JONELLE Fuel, MD;  Location: [facility] CATH LAB;  Service: Cardiovascular;  Laterality: N/A;   LITHOTRIPSY     h/o several  procedures    MASTECTOMY Bilateral    OOPHORECTOMY Bilateral    triger finger, R ring finger Right 02/2019   VENOGRAM N/A 10/20/2012   Procedure: VENOGRAM;  Surgeon: Elspeth JAYSON Fernande, MD;  Location: [facility] CATH LAB;  Service: Cardiovascular;  Laterality: N/A;    Current Outpatient Medications  Medication Instructions   albuterol  (VENTOLIN  HFA) 108 (90 Base) MCG/ACT inhaler 2 puffs, Inhalation, Every 6 hours PRN   atorvastatin  (LIPITOR) 40 mg, Oral, Daily   carvedilol  (COREG ) 12.5 MG tablet Take 1 tablet by mouth in the morning and take 1 tablet at night   cholecalciferol (VITAMIN D3) 2,000 Units, Daily   clopidogrel  (PLAVIX ) 75 mg, Oral, Daily   ENTRESTO  24-26 MG 1 tablet, Oral, 2 times daily   fexofenadine  (ALLEGRA  ALLERGY) 60 mg, Oral, 2 times daily   fluticasone  (FLONASE ) 50 MCG/ACT nasal spray 2 sprays, Each Nare, Daily   furosemide  (LASIX ) 40 MG tablet TAKE TWO TABLETS BY MOUTH IN THE MORNING AND ONE AND ONE-HALF TABLETS EVERY EVENING   gabapentin  (NEURONTIN ) 300 mg, Oral, Daily PRN   HYDROcodone  bit-homatropine (HYCODAN) 5-1.5 MG/5ML syrup 5 mLs, Oral, Every 8 hours PRN   Multiple Vitamin (MULTIVITAMIN) capsule 1 capsule, Daily   Multiple Vitamins-Minerals (ICAPS AREDS 2 PO) 1 tablet, Daily   Omega 3 1,000 mg, Daily   pantoprazole  (PROTONIX ) 40 mg, Oral, Daily   Polyvinyl Alcohol-Povidone PF (REFRESH)  1.4-0.6 % SOLN 1 drop, Daily PRN   potassium chloride  (KLOR-CON  M) 10 MEQ tablet 40 mEq, Oral, Daily   sertraline  (ZOLOFT ) 25 mg, Oral, Daily   spironolactone  (ALDACTONE ) 25 mg, Oral, Daily   tiZANidine  (ZANAFLEX ) 2 mg, Oral, Every 8 hours PRN   triamcinolone  cream (KENALOG ) 0.1 % 1 Application, Topical, 2 times daily   TURMERIC PO 1,000 mg, 2 times daily   zinc gluconate 50 mg, Daily   zolpidem  (AMBIEN ) 10 MG tablet TAKE ONE-HALF TO ONE TABLET BY MOUTH DAILY AT BEDTIME AS NEEDED FOR SLEEP       Objective:   Physical Exam BP 116/64   Pulse 72   Temp 98.4 F (36.9 C) (Oral)    Resp 16   Ht 5' 4 (1.626 m)   Wt 178 lb 6 oz (80.9 kg)   SpO2 97%   BMI 30.62 kg/m  General:   Well developed, NAD, BMI noted. HEENT:  Normocephalic . Face symmetric, atraumatic Lungs:  CTA B Normal respiratory effort, no intercostal retractions, no accessory muscle use. Heart: RRR, + systolic murmur.  Lower extremities: no pretibial edema bilaterally  Skin: Not pale. Not jaundice Neurologic:  alert & oriented X3.  Speech normal, gait appropriate for age and unassisted Psych--  Cognition and judgment appear intact.  Cooperative with normal attention span and concentration.  Behavior appropriate. No anxious or depressed appearing.      Assessment     Assessment Hyperglycemia A1c 5.9 (04/2017) High cholesterol CV:Dr. Fernande and Bensimohn --CHF, nonischemic cardiomyopathy:due to chemotherapy --MR severe --VT, paroxysmal -TIA 05-2017 Depression, insomnia Vitamin D  deficiency Kidney stones Breast cancer x 2: first in 1982 with chest wall involvement, 2nd on the R dx  1987, s/p B mastectomy; + BRACA Mild OSA per sleep study 11-2015, saw Dr Shlomo 02-20-16: no need for CPAP Osteopenia: DEXA 2016 neg; DEXA 12/2019;  2022: osteopenia, rx vitamin D  supplements Post herpetic neuralgia (shingles 2014) , R face-- gaba prn R leg swelling, US  neg DVT 10/2024 Handicap sticker signed 06-2016   PLAN: Hyperglycemia: Check A1c High cholesterol: On atorvastatin , last LFTs okay, check FLP. NICM, chronic systolic dysfunction, paroxysmal VT: LOV cardiology 08/08/2024.  No changes made.  Next visit 1 year Cough: Sporadic, nagging, takes occasionally Hycodan.  Recommend to monitor the issue, come back if cough gets worse. Other issues seem stable. RTC 6 months CPX.  Sooner if needed

## 2024-08-16 ENCOUNTER — Ambulatory Visit: Payer: Self-pay | Admitting: Internal Medicine

## 2024-09-06 ENCOUNTER — Other Ambulatory Visit (HOSPITAL_COMMUNITY): Payer: Self-pay | Admitting: Family Medicine

## 2024-09-06 DIAGNOSIS — I5022 Chronic systolic (congestive) heart failure: Secondary | ICD-10-CM

## 2024-09-20 ENCOUNTER — Ambulatory Visit (INDEPENDENT_AMBULATORY_CARE_PROVIDER_SITE_OTHER): Admitting: Family Medicine

## 2024-09-20 ENCOUNTER — Encounter: Payer: Self-pay | Admitting: Family Medicine

## 2024-09-20 ENCOUNTER — Ambulatory Visit: Payer: Self-pay

## 2024-09-20 VITALS — BP 120/72 | HR 82 | Temp 98.0°F | Resp 16 | Ht 64.0 in | Wt 175.2 lb

## 2024-09-20 DIAGNOSIS — R053 Chronic cough: Secondary | ICD-10-CM | POA: Diagnosis not present

## 2024-09-20 DIAGNOSIS — R0789 Other chest pain: Secondary | ICD-10-CM | POA: Diagnosis not present

## 2024-09-20 NOTE — Telephone Encounter (Signed)
 FYI Only or Action Required?: Action required by provider: Patient prefers OV to ED. Would like Provider to recommend ED if needed. .  Patient was last seen in primary care on 08/15/2024 by Amon Aloysius BRAVO, MD.  Called Nurse Triage reporting Cough and Back Pain.  Symptoms began several days ago.  Interventions attempted: OTC medications: Tylenol .  Symptoms are: gradually worsening.  Triage Disposition: Go to ED Now (or PCP Triage)  Patient/caregiver understands and will follow disposition?: No, wishes to speak with PCP  Copied from CRM #[id number]. Topic: Clinical - Red Word Triage >> Sep 20, 2024  9:36 AM China J wrote: [facility] that prompted transfer to Nurse Triage: Patient is having left rib cage and lower back pain when taking deep breathes. She also has a cough and has been feeling fatigued. Reason for Disposition  Taking a deep breath makes pain worse  Answer Assessment - Initial Assessment Questions Patient with ongoing pain under her ribs on the left side for a few days. Worsened over last 24hrs. Patient unsure if the cough she has had over last few weeks is the cause or making it worse. Feels like she is not being able to expand lungs enough to take deep breath without it hurting. Hurts to cough.  -147/77 usually runs low, checked with her alternate cuff 136/57- has not taken any HTN medicine yet today. O2 96%, HR 75  Taking Plavix , no missed dosed. Denies unilateral weakness, confusion or dizziness. CAL contacted as patient would rather come to office. Appt made for this afternoon. Patient advised ED is the most appropriate to diagnose and treat if heart or lungs. She understands and would like provider to recommend if she needs to go. High priority message sent to office.   1. LOCATION: Where does it hurt?       Left side under her ribs radiating to the back  2. RADIATION: Does the pain go anywhere else? (e.g., into neck, jaw, arms, back)     Under ribs radiating to the back   3. ONSET: When did the chest pain begin? (Minutes, hours or days)      A couple days, worse the last day  4. PATTERN: Does the pain come and go, or has it been constant since it started?  Does it get worse with exertion?      Worse laying down-  5. DURATION: How long does it last (e.g., seconds, minutes, hours)     Constant but fluctuates- worse laying down at night 6. SEVERITY: How bad is the pain?  (e.g., Scale 1-10; mild, moderate, or severe)     6-7/10 dull ache with sharp occasionally 7. CARDIAC RISK FACTORS: Do you have any history of heart problems or risk factors for heart disease? (e.g., angina, prior heart attack; diabetes, high blood pressure, high cholesterol, smoker, or strong family history of heart disease)    Systolic HF, TIA 8. PULMONARY RISK FACTORS: Do you have any history of lung disease?  (e.g., blood clots in lung, asthma, emphysema, birth control pills)     denies 9. CAUSE: What do you think is causing the chest pain?     Unsure, could be cough  10. OTHER SYMPTOMS: Do you have any other symptoms? (e.g., dizziness, nausea, vomiting, sweating, fever, difficulty breathing, cough)       fatigue  Answer Assessment - Initial Assessment Questions Up underneath her rib cage on the left side underneath her breast and radiates down to the rib area. Hurts to take  deep breath.  Some times feels like she is SOB. Some tenderness on the outside. Denies bruising or swelling.  No fever, coughing for awhile  Fatigue,   1. ONSET: When did the pain begin? (e.g., minutes, hours, days)     A couple days ago but getting worse today.  2. LOCATION: Where does it hurt? (upper, mid or lower back)     Left side under ribs under breast 3. SEVERITY: How bad is the pain?  (e.g., Scale 1-10; mild, moderate, or severe)     6-7/10, can really feel it worse when laying down 4. PATTERN: Is the pain constant? (e.g., yes, no; constant, intermittent)      Fluctuates but pain  is always present 5. RADIATION: Does the pain shoot into your legs or somewhere else?     Radiating down  6. CAUSE:  What do you think is causing the back pain?      Under ribs  7. BACK OVERUSE:  Any recent lifting of heavy objects, strenuous work or exercise?     denies 8. MEDICINES: What have you taken so far for the pain? (e.g., nothing, acetaminophen , NSAIDS)     Tylenol  with no benefit 9. NEUROLOGIC SYMPTOMS: Do you have any weakness, numbness, or problems with bowel/bladder control?     Denies  10. OTHER SYMPTOMS: Do you have any other symptoms? (e.g., fever, abdomen pain, burning with urination, blood in urine)       denies  Protocols used: Back Pain-A-AH, Chest Pain-A-AH

## 2024-09-20 NOTE — Patient Instructions (Signed)
 Rinse your mouth out after using the inhaler.  Use the albuterol  as needed.   Heat (pad or rice pillow in microwave) over affected area, 10-15 minutes twice daily.   Ice/cold pack over area for 10-15 min twice daily.  Let us  know if you need anything.  Pectoralis Major Rehab Ask your health care provider which exercises are safe for you. Do exercises exactly as told by your health care provider and adjust them as directed. It is normal to feel mild stretching, pulling, tightness, or discomfort as you do these exercises, but you should stop right away if you feel sudden pain or your pain gets worse. Do not begin these exercises until told by your health care provider. Stretching and range of motion exercises These exercises warm up your muscles and joints and improve the movement and flexibility of your shoulder. These exercises can also help to relieve pain, numbness, and tingling. Exercise A: Pendulum  Stand near a wall or a surface that you can hold onto for balance. Bend at the waist and let your left / right arm hang straight down. Use your other arm to keep your balance. Relax your arm and shoulder muscles, and move your hips and your trunk so your left / right arm swings freely. Your arm should swing because of the motion of your body, not because you are using your arm or shoulder muscles. Keep moving so your arm swings in the following directions, as told by your health care provider: Side to side. Forward and backward. In clockwise and counterclockwise circles. Slowly return to the starting position. Repeat 2 times. Complete this exercise 3 times per week. Exercise B: Abduction, standing Stand and hold a broomstick, a cane, or a similar object. Place your hands a little more than shoulder-width apart on the object. Your left / right hand should be palm-up, and your other hand should be palm-down. While keeping your elbow straight and your shoulder muscles relaxed, push the stick  across your body toward your left / right side. Raise your left / right arm to the side of your body and then over your head until you feel a stretch in your shoulder. Stop when you reach the angle that is recommended by your health care provider. Avoid shrugging your shoulder while you raise your arm. Keep your shoulder blade tucked down toward the middle of your spine. Hold for 10 seconds. Slowly return to the starting position. Repeat 2 times. Complete this exercise 3 times per week. Exercise C: Wand flexion, supine  Lie on your back. You may bend your knees for comfort. Hold a broomstick, a cane, or a similar object so that your hands are about shoulder-width apart on the object. Your palms should face toward your feet. Raise your left / right arm in front of your face, then behind your head (toward the floor). Use your other hand to help you do this. Stop when you feel a gentle stretch in your shoulder, or when you reach the angle that is recommended by your health care provider. Hold for 3 seconds. Use the broomstick and your other arm to help you return your left / right arm to the starting position. Repeat 2 times. Complete this exercise 3 times per week. Exercise D: Wand shoulder external rotation Stand and hold a broomstick, a cane, or a similar object so your hands are about shoulder-width apart on the object. Start with your arms hanging down, then bend both elbows to an L shape (90 degrees).  Keep your left / right elbow at your side. Use your other hand to push the stick so your left / right forearm moves away from your body, out to your side. Keep your left / right elbow bent to 90 degrees and keep it against your side. Stop when you feel a gentle stretch in your shoulder, or when you reach the angle recommended by your health care provider. Hold for 10 seconds. Use the stick to help you return your left / right arm to the starting position. Repeat 2 times. Complete this  exercise 3 times per week. Strengthening exercises These exercises build strength and endurance in your shoulder. Endurance is the ability to use your muscles for a long time, even after your muscles get tired. Exercise E: Scapular protraction, standing Stand so you are facing a wall. Place your feet about one arm-length away from the wall. Place your hands on the wall and straighten your elbows. Keep your hands on the wall as you push your upper back away from the wall. You should feel your shoulder blades sliding forward. Keep your elbows and your head still. If you are not sure that you are doing this exercise correctly, ask your health care provider for more instructions. Hold for 3 seconds. Slowly return to the starting position. Let your muscles relax completely before you repeat this exercise. Repeat 2 times. Complete this exercise 3 times per week. Exercise F: Shoulder blade squeezes  (scapular retraction) Sit with good posture in a stable chair. Do not let your back touch the back of the chair. Your arms should be at your sides with your elbows bent. You may rest your forearms on a pillow if that is more comfortable. Squeeze your shoulder blades together. Bring them down and back. Keep your shoulders level. Do not lift your shoulders up toward your ears. Hold for 3 seconds. Return to the starting position. Repeat 2 times. Complete this exercise 3 times per week. This information is not intended to replace advice given to you by your health care provider. Make sure you discuss any questions you have with your health care provider. Document Released: 11/10/2005 Document Revised: 08/21/2016 Document Reviewed: 07/29/2015 Elsevier Interactive Patient Education  [employer].

## 2024-09-20 NOTE — Telephone Encounter (Signed)
Appt scheduled w/ Dr. Wendling.  

## 2024-09-20 NOTE — Progress Notes (Signed)
 Chief Complaint  Patient presents with   Back Pain    Chest and Back Pain    [name] here for URI complaints.  Duration: 2 weeks of chest pain, has had a cough for the past several months Associated symptoms: shortness of breath, chest pain, and coughing Denies: sinus congestion, sinus pain, rhinorrhea, itchy watery eyes, ear pain, ear drainage, sore throat, wheezing, myalgia, and fevers, recent travel, personal/family history of clots, swelling, or calf pain. Treatment to date: Hycodan Sick contacts: No  Past Medical History:  Diagnosis Date   Allergy    Arthritis    Breast CA (HCC)    twice first on L breast 1982 w/ chest wall involvment, then a second breast ca on the R in 1987   Chronic systolic CHF (congestive heart failure) (HCC)    a. due to Adriamycin,s/p ICD;  b. 01/2013 Gen change and new LV lead - BSX [employer] CRT-D BiV ICD, Ser # [id number]   Depression    Diverticulitis    Early menopause    early 30s   Fatigue 02/20/2016   Glaucoma suspect of both eyes    Dr. Lonni Ferrier   Heart murmur    Hyperlipidemia    Nonischemic cardiomyopathy (HCC)    OSA (obstructive sleep apnea) 02/07/2016   Osteopenia    Renal calculus    Snoring 02/20/2016   TIA (transient ischemic attack)     Objective BP 120/72 (BP Location: Left Arm, Patient Position: Sitting)   Pulse 82   Temp 98 F (36.7 C) (Oral)   Resp 16   Ht 5' 4 (1.626 m)   Wt 175 lb 3.2 oz (79.5 kg)   SpO2 96%   BMI 30.07 kg/m  General: Awake, alert, appears stated age HEENT: AT, [place], ears patent b/l and TM's neg, nares patent w/o discharge, pharynx pink and without exudates, MMM, no sinus TTP bilaterally MSK: TTP over the posterior axillary line rib cage on the left through the mid clavicular line.  There is no crepitus, erythema, edema, excessive warmth, or overlying skin change. No calf TTP bilaterally. Neck: No masses or asymmetry Heart: RRR, no lower extremity edema Lungs: CTAB, no accessory  muscle use Psych: Age appropriate judgment and insight, normal mood and affect  Chronic cough  Chest wall pain  Chronic, not controlled.  Trelegy sample provided today.  Suspect cough variant asthma.  Rinse mouth out after use.  Follow-up in 3 weeks to recheck this.  Continue to push fluids, practice good hand hygiene, cover mouth when coughing. I suspect she has chest wall pain secondary to coughing.  With tenderness to palpation on exam, PE less likely.  She has no risk factors for this either. Pt voiced understanding and agreement to the plan.  Mabel [place], DO 09/20/24 3:50 PM

## 2024-09-26 ENCOUNTER — Other Ambulatory Visit: Payer: Self-pay | Admitting: Internal Medicine

## 2024-09-26 ENCOUNTER — Other Ambulatory Visit (HOSPITAL_COMMUNITY): Payer: Self-pay | Admitting: Family Medicine

## 2024-09-26 DIAGNOSIS — I5022 Chronic systolic (congestive) heart failure: Secondary | ICD-10-CM

## 2024-10-14 ENCOUNTER — Other Ambulatory Visit: Payer: Self-pay | Admitting: Internal Medicine

## 2024-10-14 ENCOUNTER — Other Ambulatory Visit (HOSPITAL_COMMUNITY): Payer: Self-pay | Admitting: Family Medicine

## 2024-10-14 DIAGNOSIS — I5022 Chronic systolic (congestive) heart failure: Secondary | ICD-10-CM

## 2024-10-14 NOTE — Telephone Encounter (Signed)
 Requesting: Ambien  10mg   Contract: 02/13/23 UDS: Ambien  only Last Visit: 08/15/24 Next Visit: 10/28/24 Last Refill: 01/25/24 #90 and 1RF   Please Advise

## 2024-10-28 ENCOUNTER — Ambulatory Visit: Admitting: Family

## 2024-11-07 ENCOUNTER — Ambulatory Visit (INDEPENDENT_AMBULATORY_CARE_PROVIDER_SITE_OTHER): Payer: Medicare Other

## 2024-11-07 LAB — CUP PACEART REMOTE DEVICE CHECK
Battery Remaining Longevity: 114 mo
Battery Remaining Percentage: 100 %
Brady Statistic RA Percent Paced: 0 %
Brady Statistic RV Percent Paced: 100 %
Date Time Interrogation Session: 20251215010100
HighPow Impedance: 47 Ohm
Implantable Lead Connection Status: 753985
Implantable Lead Connection Status: 753985
Implantable Lead Connection Status: 753985
Implantable Lead Implant Date: 20050701
Implantable Lead Implant Date: 20050701
Implantable Lead Implant Date: 20140314
Implantable Lead Location: 753858
Implantable Lead Location: 753859
Implantable Lead Location: 753860
Implantable Lead Model: 158
Implantable Lead Model: 4196
Implantable Lead Model: 5076
Implantable Lead Serial Number: 156416
Implantable Pulse Generator Implant Date: 20240315
Lead Channel Impedance Value: 438 Ohm
Lead Channel Impedance Value: 521 Ohm
Lead Channel Impedance Value: 555 Ohm
Lead Channel Pacing Threshold Amplitude: 0.5 V
Lead Channel Pacing Threshold Amplitude: 1 V
Lead Channel Pacing Threshold Pulse Width: 0.4 ms
Lead Channel Pacing Threshold Pulse Width: 0.4 ms
Lead Channel Setting Pacing Amplitude: 2 V
Lead Channel Setting Pacing Amplitude: 2 V
Lead Channel Setting Pacing Amplitude: 2 V
Lead Channel Setting Pacing Pulse Width: 0.4 ms
Lead Channel Setting Pacing Pulse Width: 0.6 ms
Lead Channel Setting Sensing Sensitivity: 0.6 mV
Lead Channel Setting Sensing Sensitivity: 1 mV
Pulse Gen Serial Number: 159739

## 2024-11-09 NOTE — Progress Notes (Signed)
 Remote ICD Transmission

## 2024-11-10 ENCOUNTER — Ambulatory Visit (HOSPITAL_COMMUNITY): Admission: RE | Admit: 2024-11-10 | Discharge: 2024-11-10 | Attending: Internal Medicine | Admitting: Internal Medicine

## 2024-11-10 VITALS — BP 112/60 | HR 68 | Wt 181.2 lb

## 2024-11-10 DIAGNOSIS — Z95810 Presence of automatic (implantable) cardiac defibrillator: Secondary | ICD-10-CM | POA: Diagnosis not present

## 2024-11-10 DIAGNOSIS — I080 Rheumatic disorders of both mitral and aortic valves: Secondary | ICD-10-CM | POA: Insufficient documentation

## 2024-11-10 DIAGNOSIS — I340 Nonrheumatic mitral (valve) insufficiency: Secondary | ICD-10-CM

## 2024-11-10 DIAGNOSIS — I428 Other cardiomyopathies: Secondary | ICD-10-CM | POA: Insufficient documentation

## 2024-11-10 DIAGNOSIS — I5022 Chronic systolic (congestive) heart failure: Secondary | ICD-10-CM

## 2024-11-10 DIAGNOSIS — R42 Dizziness and giddiness: Secondary | ICD-10-CM | POA: Diagnosis not present

## 2024-11-10 DIAGNOSIS — Z7902 Long term (current) use of antithrombotics/antiplatelets: Secondary | ICD-10-CM | POA: Diagnosis not present

## 2024-11-10 DIAGNOSIS — Z8673 Personal history of transient ischemic attack (TIA), and cerebral infarction without residual deficits: Secondary | ICD-10-CM

## 2024-11-10 DIAGNOSIS — R5383 Other fatigue: Secondary | ICD-10-CM | POA: Insufficient documentation

## 2024-11-10 DIAGNOSIS — I351 Nonrheumatic aortic (valve) insufficiency: Secondary | ICD-10-CM | POA: Diagnosis not present

## 2024-11-10 DIAGNOSIS — Z853 Personal history of malignant neoplasm of breast: Secondary | ICD-10-CM | POA: Insufficient documentation

## 2024-11-10 DIAGNOSIS — Z4502 Encounter for adjustment and management of automatic implantable cardiac defibrillator: Secondary | ICD-10-CM | POA: Insufficient documentation

## 2024-11-10 DIAGNOSIS — G4733 Obstructive sleep apnea (adult) (pediatric): Secondary | ICD-10-CM | POA: Diagnosis not present

## 2024-11-10 DIAGNOSIS — Z79899 Other long term (current) drug therapy: Secondary | ICD-10-CM | POA: Diagnosis not present

## 2024-11-10 LAB — ECHOCARDIOGRAM COMPLETE
AR max vel: 1.2 cm2
AV Area VTI: 1.45 cm2
AV Area mean vel: 1.46 cm2
AV Mean grad: 13.5 mmHg
AV Peak grad: 24.4 mmHg
Ao pk vel: 2.47 m/s
Area-P 1/2: 2.55 cm2

## 2024-11-10 MED ORDER — FUROSEMIDE 40 MG PO TABS: ORAL_TABLET | ORAL | 0 refills | Status: AC

## 2024-11-10 MED ORDER — CARVEDILOL 6.25 MG PO TABS: ORAL_TABLET | ORAL | 5 refills | Status: AC

## 2024-11-10 NOTE — Patient Instructions (Addendum)
 Good to see you today!  DECREASE lasix  to 80 mg in the morning and 40 mg in the evening  DECREASE coreg  to 6.25 mg ( 1 tablet) Twice daily  If you care not feeling better in 2 weeks call office  Your physician recommends that you schedule a follow-up appointment  6 months( June) call office in April to schedule an appointment  If you have any questions or concerns before your next appointment please send us  a message through [place] or call our office at [phone].    TO LEAVE A MESSAGE FOR THE NURSE SELECT OPTION 2, PLEASE LEAVE A MESSAGE INCLUDING: YOUR NAME DATE OF BIRTH CALL BACK NUMBER REASON FOR CALL**this is important as we prioritize the call backs  YOU WILL RECEIVE A CALL BACK THE SAME DAY AS LONG AS YOU CALL BEFORE 4:00 PM At the [facility], you and your health needs are our priority. As part of our continuing mission to provide you with exceptional heart care, we have created designated Provider Care Teams. These Care Teams include your primary Cardiologist (physician) and Advanced Practice Providers (APPs- Physician Assistants and Nurse Practitioners) who all work together to provide you with the care you need, when you need it.   You may see any of the following providers on your designated Care Team at your next follow up: Dr Toribio Fuel Dr Ezra Shuck Dr. Morene Brownie Greig Mosses, NP Caffie Shed, [place] [facility] [place], [place] Beckey Coe, NP Jordan Lee, NP Ellouise Class, NP Tinnie Redman, PharmD Jaun Bash, PharmD   Please be sure to bring in all your medications bottles to every appointment.    Thank you for choosing [facility]

## 2024-11-10 NOTE — Progress Notes (Signed)
 [facility] NOTE  Patient ID: [name], female   DOB: 1951/06/22, 73 y.o.   MRN: [id number] PCP: Dr Amon EP: Dr Fernande HF Cardiologist: Dr. Cherrie  Chief complaint: HF   HPI [name] is a 73 y.o.woman with h/o chronic systolic presumed due to chemotherapy-related cardiomyopathy, left breast cancer in 1982 treated with chemo (including adriamycin)/XRT and L mastectomy. Had R breast cancer (unrelated) in 1987. Has been cancer free since. Denies HTN, HL, DM2.  Was first diagnosed with HF in 1988. Had left heart cath she thinks in 2009. 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 Echo 2009 EF 20% with moderate AI and mild to moderate MR. Had [employer] ICD placed and then LV lead became nonfunctional. In 3/14, underwent CRT revision.   Had sleep study 1/17 very mild OSA (6.2/hr) not using CPAP.   Admitted in 6/18 withTIA. Unable to use right hand and speech slurred. CT normal. Unable to do MRI.   Today she returns for HF follow up. Feeling very fatigued. Denies SOB, edema, orthopnea or PND. Feels BP runs low at times Feels lightheaded  Echo 06/22/23, EF 45%  Echo today 11/10/24 EF 50-55% mod MR/mod AI Personally reviewed  ReDS 27%  Cardiac studies  Echo (11/14) with Dr. Blanca: EF 10-15% with significant dyssynchrony. Subsequently had LV lead checked and was functioning well.  Echo (4/16):EF 15% normal RV moderate AI Echo (9/17):EF 20-25% RV ok. Severe MR mild AI/AS Echo (12/19): EF 25% RV ok  Echo (9/22): EF 40-45% Mild AS Mod AI Mod MR   - CPX 2/19: pVO2 18.3 (103% predicted peak VO2) VE/VCO2 slope:  30 Peak RER: 0.95  - LHC/RHC (10/06/13)  RA = 11  RV = 54/9/13  PA = 53/27 (38)  PCW = 23  Fick cardiac output/index = 3.8/2.0  Thermo CO/CI = 3.4/1.8  PVR = 3.9 WU (Fick)  FA sat = 98%  PA sat = 62%, 64%  SVC = 59%  Coronaries no significant disease but there was a fistula from small D1 => PA.   Blood type A+    FHX: Had older brother with HF in his 4s. No strong FHx of HF.   SocHx: Retired. Non-smoker. Rare ETOH. 3 grown kids.   Review of systems complete and found to be negative unless listed in HPI.    Past Medical History:  Diagnosis Date   Allergy    Arthritis    Breast CA (HCC)    twice first on L breast 1982 w/ chest wall involvment, then a second breast ca on the R in 1987   Chronic systolic CHF (congestive heart failure) (HCC)    a. due to Adriamycin,s/p ICD;  b. 01/2013 Gen change and new LV lead - BSX [employer] CRT-D BiV ICD, Ser # [id number]   Depression    Diverticulitis    Early menopause    early 30s   Fatigue 02/20/2016   Glaucoma suspect of both eyes    Dr. Lonni Ferrier   Heart murmur    Hyperlipidemia    Nonischemic cardiomyopathy (HCC)    OSA (obstructive sleep apnea) 02/07/2016   Osteopenia    Renal calculus    Snoring 02/20/2016   TIA (transient ischemic attack)     Past Surgical History:  Procedure Laterality Date   BI-VENTRICULAR IMPLANTABLE CARDIOVERTER DEFIBRILLATOR UPGRADE N/A 02/04/2013   Procedure: BI-VENTRICULAR IMPLANTABLE CARDIOVERTER DEFIBRILLATOR UPGRADE;  Surgeon: Danelle LELON [place], MD;  [facility] CATH LAB;  Service: Cardiovascular;  Laterality: N/A;   BIV ICD GENERATOR CHANGEOUT N/A 02/06/2023   Procedure: BIV ICD GENERATOR CHANGEOUT;  Surgeon: Fernande Elspeth BROCKS, MD;  Location: [facility] INVASIVE CV LAB;  Service: Cardiovascular;  Laterality: N/A;   CARDIAC DEFIBRILLATOR PLACEMENT     AICD Replaced-5/09 and 2014   LEFT AND RIGHT HEART CATHETERIZATION WITH CORONARY ANGIOGRAM N/A 10/06/2013   Procedure: LEFT AND RIGHT HEART CATHETERIZATION WITH CORONARY ANGIOGRAM;  Surgeon: Toribio JONELLE Fuel, MD;  Location: [facility] CATH LAB;  Service: Cardiovascular;  Laterality: N/A;   LITHOTRIPSY     h/o several procedures    MASTECTOMY Bilateral    OOPHORECTOMY Bilateral    triger finger, R ring finger Right 02/2019   VENOGRAM N/A 10/20/2012   Procedure: VENOGRAM;   Surgeon: Elspeth BROCKS Fernande, MD;  Location: [facility] CATH LAB;  Service: Cardiovascular;  Laterality: N/A;    Current Outpatient Medications  Medication Sig Dispense Refill   albuterol  (VENTOLIN  HFA) 108 (90 Base) MCG/ACT inhaler Inhale 2 puffs into the lungs every 6 (six) hours as needed. 18 g 0   atorvastatin  (LIPITOR) 40 MG tablet Take 1 tablet (40 mg total) by mouth daily. 90 tablet 1   carvedilol  (COREG ) 12.5 MG tablet Take 1 tablet by mouth in the morning and take 1 tablet at night 180 tablet 3   cholecalciferol 25 MCG (1000 UT) tablet Take 2,000 Units by mouth daily.     clopidogrel  (PLAVIX ) 75 MG tablet TAKE ONE TABLET BY MOUTH ONCE A DAY 90 tablet 3   ENTRESTO  24-26 MG TAKE ONE TABLET BY MOUTH TWICE DAILY 180 tablet 0   fexofenadine  (ALLEGRA  ALLERGY) 60 MG tablet Take 1 tablet (60 mg total) by mouth 2 (two) times daily.     fluticasone  (FLONASE ) 50 MCG/ACT nasal spray Place 2 sprays into both nostrils daily. 16 g 1   furosemide  (LASIX ) 40 MG tablet TAKE TWO TABLETS BY MOUTH IN THE MORNING AND ONE AND ONE-HALF TABLETS EVERY EVENING 270 tablet 0   gabapentin  (NEURONTIN ) 300 MG capsule Take 1 capsule (300 mg total) by mouth daily as needed. 90 capsule 1   HYDROcodone  bit-homatropine (HYCODAN) 5-1.5 MG/5ML syrup Take 5 mLs by mouth every 8 (eight) hours as needed for cough. 120 mL 0   Multiple Vitamin (MULTIVITAMIN) capsule Take 1 capsule by mouth daily. Focus factor     Multiple Vitamins-Minerals (ICAPS AREDS 2 PO) Take 1 tablet by mouth daily.     Omega 3 1000 MG CAPS Take 1,000 mg by mouth daily.     pantoprazole  (PROTONIX ) 40 MG tablet Take 1 tablet (40 mg total) by mouth daily. 90 tablet 1   Polyvinyl Alcohol-Povidone PF (REFRESH) 1.4-0.6 % SOLN Place 1 drop into both eyes daily as needed (Dry eyes).     potassium chloride  (KLOR-CON  M) 10 MEQ tablet TAKE 3 TABLETS BY MOUTH ONCE A DAY 270 tablet 0   sertraline  (ZOLOFT ) 25 MG tablet Take 1 tablet (25 mg total) by mouth daily. 90 tablet 1    spironolactone  (ALDACTONE ) 25 MG tablet TAKE ONE TABLET BY MOUTH ONCE A DAY 90 tablet 3   tiZANidine  (ZANAFLEX ) 2 MG tablet Take 1 tablet (2 mg total) by mouth every 8 (eight) hours as needed for muscle spasms. 15 tablet 0   triamcinolone  cream (KENALOG ) 0.1 % Apply 1 Application topically 2 (two) times daily. 30 g 0   TURMERIC PO Take 1,000 mg by mouth 2 (two) times daily.     zinc gluconate 50 MG tablet Take  50 mg by mouth daily.     zolpidem  (AMBIEN ) 10 MG tablet TAKE HALF TO ONE TABLET BY MOUTH AT BEDTIME AS NEEDED FOR SLEEP 90 tablet 0   No current facility-administered medications for this encounter.   Allergies  Allergen Reactions   Atacand [Candesartan] Other (See Comments)    Causes fatigue    Dapagliflozin     Details not clear   Keflex [Cephalexin] Hives and Itching   Latex Rash   BP 112/60   Pulse 68   Wt 82.2 kg (181 lb 3.2 oz)   SpO2 99%   BMI 31.10 kg/m   Wt Readings from Last 3 Encounters:  11/10/24 82.2 kg (181 lb 3.2 oz)  09/20/24 79.5 kg (175 lb 3.2 oz)  08/15/24 80.9 kg (178 lb 6 oz)   Physical Exam General:  Sitting up No resp difficulty HEENT: normal Neck: supple. no JVD.  Cor: Regular rate & rhythm. 2/6 SEM LUSB 2/6 MR Lungs: clear Abdomen: soft, nontender, nondistended.Good bowel sounds. Extremities: no cyanosis, clubbing, rash, edema Neuro: alert & orientedx3, cranial nerves grossly intact. moves all 4 extremities w/o difficulty. Affect pleasant  ECG: n/a  Device interrogation (personally reviewed): HL score 6 Activity level 1.9 Volume ok 100% LV paced. Personally reviewed  ReDS: 27%   Assessment and  Plan  1. Chronic systolic HF:  - Nonischemic cardiomyopathy, possibly anthracycline cardiotoxicity.   - LHC 11/14 without significant coronary disease.  - Has [employer] CRT-D.  - Echo 11.14 EF 10-15%. [facility] at [facility] 4/16 EF 15% moderate AI.  - Echo 6/18 LVEF 20-25%, Grade 1 DD. - Echo 6/21 EF improved to 40-45% - Echo 2022 EF 40-45%  Mild AS Moderate AI and moderate functional MR - Echo 7/24 EF 45% mild MR moderate AI - Echo today 11/10/24 EF 50-55% mod MR/mod AI Personally reviewed - Worse today NYHA III Volume low - Suspect being affected by low BP - Decrease Lasix  80/60 -> 80/40 - Did not tolerate Jardiance  - Continue Entresto  24/26 mg bid. Previously decreased due to orthostasis - Drop carvedilol  12.5 mg bid -> 6.2 5bid - Continue spironolactone  25 mg daily. - ICD interrogated personally  - Check labs - If not improving can cut Entresto  in half as well  2. Valvular heart disease - Echo 07/2021 mild AS mod AI and moderate functional MR - Echo 7/24 EF 45% mild MR moderate AI - Echo today 11/10/24 EF 50-55% mod MR/mod AI Personally reviewed - Aware of SBE prophylaxis - Continue to follow with serial echos  3. h/o bilateral breast CA in 1982 and 1987 (treated with Adriamycin in 1982) - stable.  - No change  4. ICD  - Follows with EP - ICD interrogated today in clinic personally  5. h/o TIA.  - Continue Plavix  and statin.  - No residual   Toribio Fuel, MD 11/10/2024

## 2024-11-10 NOTE — Progress Notes (Signed)
 ReDS Vest / Clip - 11/10/24 1000       ReDS Vest / Clip   Station Marker B    Ruler Value 32    ReDS Value Range Low volume    ReDS Actual Value 27

## 2024-11-14 LAB — HM DIABETES EYE EXAM

## 2024-11-15 ENCOUNTER — Encounter: Payer: Self-pay | Admitting: Internal Medicine

## 2024-11-15 DIAGNOSIS — E119 Type 2 diabetes mellitus without complications: Secondary | ICD-10-CM | POA: Insufficient documentation

## 2024-11-26 ENCOUNTER — Ambulatory Visit: Payer: Self-pay | Admitting: Cardiology

## 2024-11-30 ENCOUNTER — Other Ambulatory Visit: Payer: Self-pay | Admitting: Internal Medicine

## 2024-12-19 ENCOUNTER — Telehealth (HOSPITAL_COMMUNITY): Payer: Self-pay

## 2024-12-19 NOTE — Telephone Encounter (Signed)
 Advanced Heart Failure Patient Advocate Encounter  The patient was renewed for a [employer] grant that will help cover the cost of Carvedilol , Entresto , Spironolactone .  Total amount awarded, $7,500.  Effective: 11/22/2024 - 11/21/2025.  BIN [id number] PCN [id number] [id number] ID [id number]  Pharmacy provided with approval and processing information. Patient informed via MyChart.  Rachel DEL, CPhT Rx Patient Advocate Phone: [phone]

## 2024-12-23 NOTE — Telephone Encounter (Signed)
 Confirmed $0 copay, informed patient by phone

## 2024-12-29 ENCOUNTER — Other Ambulatory Visit (HOSPITAL_COMMUNITY): Payer: Self-pay

## 2025-03-15 ENCOUNTER — Ambulatory Visit

## 2025-05-08 ENCOUNTER — Encounter

## 2025-08-07 ENCOUNTER — Encounter

## 2025-08-21 ENCOUNTER — Encounter: Admitting: Internal Medicine

## 2025-11-06 ENCOUNTER — Encounter

## 2026-02-05 ENCOUNTER — Encounter

## 2026-05-07 ENCOUNTER — Encounter

## 2026-08-06 ENCOUNTER — Encounter
